# Patient Record
Sex: Female | Born: 1965
Health system: Southern US, Community
[De-identification: ages and names within clinical notes are randomized; demographics above are authoritative.]

## PROBLEM LIST (undated history)

## (undated) DIAGNOSIS — Z923 Personal history of irradiation: Secondary | ICD-10-CM

## (undated) DIAGNOSIS — K810 Acute cholecystitis: Secondary | ICD-10-CM

## (undated) DIAGNOSIS — Z9221 Personal history of antineoplastic chemotherapy: Secondary | ICD-10-CM

## (undated) DIAGNOSIS — C50919 Malignant neoplasm of unspecified site of unspecified female breast: Secondary | ICD-10-CM

## (undated) DIAGNOSIS — F419 Anxiety disorder, unspecified: Secondary | ICD-10-CM

## (undated) DIAGNOSIS — R7989 Other specified abnormal findings of blood chemistry: Secondary | ICD-10-CM

## (undated) DIAGNOSIS — D649 Anemia, unspecified: Secondary | ICD-10-CM

## (undated) DIAGNOSIS — T7840XA Allergy, unspecified, initial encounter: Secondary | ICD-10-CM

## (undated) DIAGNOSIS — J189 Pneumonia, unspecified organism: Secondary | ICD-10-CM

## (undated) DIAGNOSIS — K851 Biliary acute pancreatitis without necrosis or infection: Secondary | ICD-10-CM

## (undated) DIAGNOSIS — K219 Gastro-esophageal reflux disease without esophagitis: Secondary | ICD-10-CM

## (undated) HISTORY — DX: Malignant neoplasm of unspecified site of unspecified female breast: C50.919

## (undated) HISTORY — DX: Allergy, unspecified, initial encounter: T78.40XA

## (undated) HISTORY — DX: Other specified abnormal findings of blood chemistry: R79.89

## (undated) HISTORY — PX: BREAST LUMPECTOMY: SHX2

## (undated) HISTORY — PX: DILATION AND CURETTAGE OF UTERUS: SHX78

## (undated) HISTORY — DX: Acute cholecystitis: K81.0

## (undated) HISTORY — DX: Biliary acute pancreatitis without necrosis or infection: K85.10

---

## 2004-10-24 ENCOUNTER — Other Ambulatory Visit: Admission: RE | Admit: 2004-10-24 | Discharge: 2004-10-24 | Payer: Self-pay | Admitting: Obstetrics and Gynecology

## 2005-03-27 ENCOUNTER — Ambulatory Visit (HOSPITAL_COMMUNITY): Admission: RE | Admit: 2005-03-27 | Discharge: 2005-03-27 | Payer: Self-pay | Admitting: Family Medicine

## 2006-05-14 ENCOUNTER — Other Ambulatory Visit: Admission: RE | Admit: 2006-05-14 | Discharge: 2006-05-14 | Payer: Self-pay | Admitting: Gynecology

## 2006-06-12 ENCOUNTER — Ambulatory Visit (HOSPITAL_COMMUNITY): Admission: RE | Admit: 2006-06-12 | Discharge: 2006-06-12 | Payer: Self-pay | Admitting: Gynecology

## 2007-06-23 ENCOUNTER — Other Ambulatory Visit: Admission: RE | Admit: 2007-06-23 | Discharge: 2007-06-23 | Payer: Self-pay | Admitting: Gynecology

## 2007-10-12 ENCOUNTER — Other Ambulatory Visit: Admission: RE | Admit: 2007-10-12 | Discharge: 2007-10-12 | Payer: Self-pay | Admitting: Gynecology

## 2008-02-29 ENCOUNTER — Ambulatory Visit (HOSPITAL_COMMUNITY): Admission: RE | Admit: 2008-02-29 | Discharge: 2008-02-29 | Payer: Self-pay | Admitting: Gynecology

## 2008-06-23 ENCOUNTER — Other Ambulatory Visit: Admission: RE | Admit: 2008-06-23 | Discharge: 2008-06-23 | Payer: Self-pay | Admitting: Gynecology

## 2009-09-08 ENCOUNTER — Ambulatory Visit (HOSPITAL_COMMUNITY): Admission: RE | Admit: 2009-09-08 | Discharge: 2009-09-08 | Payer: Self-pay | Admitting: Gynecology

## 2010-11-08 ENCOUNTER — Ambulatory Visit (HOSPITAL_COMMUNITY)
Admission: RE | Admit: 2010-11-08 | Discharge: 2010-11-08 | Payer: Self-pay | Source: Home / Self Care | Attending: Obstetrics & Gynecology | Admitting: Obstetrics & Gynecology

## 2012-03-18 ENCOUNTER — Other Ambulatory Visit (HOSPITAL_COMMUNITY): Payer: Self-pay | Admitting: Obstetrics & Gynecology

## 2012-03-18 DIAGNOSIS — Z1231 Encounter for screening mammogram for malignant neoplasm of breast: Secondary | ICD-10-CM

## 2012-04-10 ENCOUNTER — Ambulatory Visit (HOSPITAL_COMMUNITY)
Admission: RE | Admit: 2012-04-10 | Discharge: 2012-04-10 | Disposition: A | Discharge status: Home / Self Care | Payer: Self-pay | Source: Ambulatory Visit | Attending: Obstetrics & Gynecology | Admitting: Obstetrics & Gynecology

## 2012-04-10 DIAGNOSIS — Z1231 Encounter for screening mammogram for malignant neoplasm of breast: Secondary | ICD-10-CM

## 2014-08-03 ENCOUNTER — Other Ambulatory Visit (HOSPITAL_COMMUNITY)
Admission: RE | Admit: 2014-08-03 | Discharge: 2014-08-03 | Disposition: A | Discharge status: Home / Self Care | Payer: Managed Care, Other (non HMO) | Source: Ambulatory Visit | Attending: Women's Health | Admitting: Women's Health

## 2014-08-03 ENCOUNTER — Encounter: Payer: Self-pay | Admitting: Women's Health

## 2014-08-03 ENCOUNTER — Ambulatory Visit (INDEPENDENT_AMBULATORY_CARE_PROVIDER_SITE_OTHER): Payer: Managed Care, Other (non HMO) | Admitting: Women's Health

## 2014-08-03 ENCOUNTER — Other Ambulatory Visit: Payer: Self-pay | Admitting: Women's Health

## 2014-08-03 ENCOUNTER — Other Ambulatory Visit: Payer: Self-pay | Admitting: Gynecology

## 2014-08-03 VITALS — BP 120/79 | Ht 66.0 in | Wt 232.4 lb

## 2014-08-03 DIAGNOSIS — R87810 Cervical high risk human papillomavirus (HPV) DNA test positive: Secondary | ICD-10-CM | POA: Insufficient documentation

## 2014-08-03 DIAGNOSIS — Z1151 Encounter for screening for human papillomavirus (HPV): Secondary | ICD-10-CM | POA: Insufficient documentation

## 2014-08-03 DIAGNOSIS — Z113 Encounter for screening for infections with a predominantly sexual mode of transmission: Secondary | ICD-10-CM

## 2014-08-03 DIAGNOSIS — Z01419 Encounter for gynecological examination (general) (routine) without abnormal findings: Secondary | ICD-10-CM | POA: Diagnosis present

## 2014-08-03 DIAGNOSIS — Z1231 Encounter for screening mammogram for malignant neoplasm of breast: Secondary | ICD-10-CM

## 2014-08-03 DIAGNOSIS — Z13220 Encounter for screening for lipoid disorders: Secondary | ICD-10-CM

## 2014-08-03 LAB — CBC WITH DIFFERENTIAL/PLATELET
Basophils Absolute: 0 10*3/uL (ref 0.0–0.1)
Basophils Relative: 0 % (ref 0–1)
Eosinophils Absolute: 0.1 10*3/uL (ref 0.0–0.7)
Eosinophils Relative: 2 % (ref 0–5)
HCT: 39 % (ref 36.0–46.0)
Hemoglobin: 13.3 g/dL (ref 12.0–15.0)
Lymphocytes Relative: 26 % (ref 12–46)
Lymphs Abs: 1.9 10*3/uL (ref 0.7–4.0)
MCH: 30.3 pg (ref 26.0–34.0)
MCHC: 34.1 g/dL (ref 30.0–36.0)
MCV: 88.8 fL (ref 78.0–100.0)
Monocytes Absolute: 0.6 10*3/uL (ref 0.1–1.0)
Monocytes Relative: 8 % (ref 3–12)
Neutro Abs: 4.7 10*3/uL (ref 1.7–7.7)
Neutrophils Relative %: 64 % (ref 43–77)
Platelets: 314 10*3/uL (ref 150–400)
RBC: 4.39 MIL/uL (ref 3.87–5.11)
RDW: 13.7 % (ref 11.5–15.5)
WBC: 7.3 10*3/uL (ref 4.0–10.5)

## 2014-08-03 LAB — COMPREHENSIVE METABOLIC PANEL
ALT: 26 U/L (ref 0–35)
AST: 19 U/L (ref 0–37)
Albumin: 4.4 g/dL (ref 3.5–5.2)
Alkaline Phosphatase: 47 U/L (ref 39–117)
BUN: 12 mg/dL (ref 6–23)
CO2: 25 mEq/L (ref 19–32)
Calcium: 10 mg/dL (ref 8.4–10.5)
Chloride: 99 mEq/L (ref 96–112)
Creat: 0.76 mg/dL (ref 0.50–1.10)
Glucose, Bld: 120 mg/dL — ABNORMAL HIGH (ref 70–99)
Potassium: 4.2 mEq/L (ref 3.5–5.3)
Sodium: 135 mEq/L (ref 135–145)
Total Bilirubin: 0.6 mg/dL (ref 0.2–1.2)
Total Protein: 7.1 g/dL (ref 6.0–8.3)

## 2014-08-03 LAB — LIPID PANEL
Cholesterol: 160 mg/dL (ref 0–200)
HDL: 63 mg/dL (ref >39)
LDL Cholesterol: 78 mg/dL (ref 0–99)
Total CHOL/HDL Ratio: 2.5 Ratio
Triglycerides: 95 mg/dL (ref <150)
VLDL: 19 mg/dL (ref 0–40)

## 2014-08-03 NOTE — Progress Notes (Signed)
Lindsay Hampton [birth year] [id number]    History:    Presents for annual exam.  Monthly 4 days cycle/condoms. [facility], was [facility] after [facility]. 2 children both doing well. Mother breast cancer in her 34s, survivor. Normal mammograms, last mammogram 2013. New partner.  Past medical history, past surgical history, family history and social history were all reviewed and documented in the EPIC chart. Works for [employer]. Father prostate cancer.  ROS:  A  12 point ROS was performed and pertinent positives and negatives are included.  Exam:  Filed Vitals:   08/03/14 0950  BP: 120/79    General appearance:  Normal Thyroid:  Symmetrical, normal in size, without palpable masses or nodularity. Respiratory  Auscultation:  Clear without wheezing or rhonchi Cardiovascular  Auscultation:  Regular rate, without rubs, murmurs or gallops  Edema/varicosities:  Not grossly evident Abdominal  Soft,nontender, without masses, guarding or rebound.  Liver/spleen:  No organomegaly noted  Hernia:  None appreciated  Skin  Inspection:  Grossly normal   Breasts: Examined lying and sitting.     Right: Without masses, retractions, discharge or axillary adenopathy.     Left: Without masses, retractions, discharge or axillary adenopathy. Gentitourinary   Inguinal/mons:  Normal without inguinal adenopathy  External genitalia:  Normal  BUS/Urethra/Skene's glands:  Normal  Vagina:  Normal  Cervix:  Normal  Uterus:   normal in size, shape and contour.  Midline and mobile  Adnexa/parametria:     Rt: Without masses or tenderness.   Lt: Without masses or tenderness.  Anus and perineum: Normal  Digital rectal exam: Normal sphincter tone without palpated masses or tenderness  Assessment/Plan:  48 y.o. DWF G3P2 for annual exam with no complaints.  Normal GYN exam/condoms STD screen  Plan: SBE's, schedule annual screening mammogram, 3D  tomography reviewed, history of mother breast cancer in her 47s. Increase regular exercise and decrease calories for weight loss, vitamin D 2000 daily encouraged. CBC, CMP, lipid panel, GC/Chlamydia, HIV, hep B, C., RPR. Pap with high-risk HPV typing. New Pap screening guidelines reviewed  Note: This dictation was prepared with Dragon/digital dictation.  Any transcriptional errors that result are unintentional. Huel Cote WHNP, 1:10 PM 08/03/2014

## 2014-08-03 NOTE — Patient Instructions (Signed)

## 2014-08-04 LAB — CYTOLOGY - PAP

## 2014-08-04 LAB — URINALYSIS W MICROSCOPIC + REFLEX CULTURE
Bilirubin Urine: NEGATIVE
Casts: NONE SEEN
Crystals: NONE SEEN
Glucose, UA: NEGATIVE mg/dL
Hgb urine dipstick: NEGATIVE
Ketones, ur: NEGATIVE mg/dL
Nitrite: NEGATIVE
Protein, ur: NEGATIVE mg/dL
Specific Gravity, Urine: 1.016 (ref 1.005–1.030)
Urobilinogen, UA: 0.2 mg/dL (ref 0.0–1.0)
pH: 5.5 (ref 5.0–8.0)

## 2014-08-04 LAB — RPR

## 2014-08-04 LAB — GC/CHLAMYDIA PROBE AMP
CT Probe RNA: NEGATIVE
GC Probe RNA: NEGATIVE

## 2014-08-04 LAB — HIV ANTIBODY (ROUTINE TESTING W REFLEX): HIV 1&2 Ab, 4th Generation: NONREACTIVE

## 2014-08-04 LAB — HEMOGLOBIN A1C
Hgb A1c MFr Bld: 5.8 % — ABNORMAL HIGH (ref <5.7)
Mean Plasma Glucose: 120 mg/dL — ABNORMAL HIGH (ref <117)

## 2014-08-04 LAB — HEPATITIS C ANTIBODY: HCV Ab: NEGATIVE

## 2014-08-04 LAB — HEPATITIS B SURFACE ANTIGEN: Hepatitis B Surface Ag: NEGATIVE

## 2014-08-05 LAB — URINE CULTURE: Colony Count: 15000

## 2014-09-07 ENCOUNTER — Telehealth: Payer: Self-pay | Admitting: *Deleted

## 2014-09-07 NOTE — Telephone Encounter (Signed)
Pt called with questions regarding lab results on 08/03/14 all question answered.

## 2014-09-27 ENCOUNTER — Ambulatory Visit (HOSPITAL_COMMUNITY)
Admission: RE | Admit: 2014-09-27 | Discharge: 2014-09-27 | Disposition: A | Discharge status: Home / Self Care | Payer: Managed Care, Other (non HMO) | Source: Ambulatory Visit | Attending: Gynecology | Admitting: Gynecology

## 2014-09-27 DIAGNOSIS — Z1231 Encounter for screening mammogram for malignant neoplasm of breast: Secondary | ICD-10-CM

## 2014-09-28 ENCOUNTER — Ambulatory Visit (HOSPITAL_COMMUNITY): Payer: Self-pay

## 2015-08-08 ENCOUNTER — Encounter: Payer: Managed Care, Other (non HMO) | Admitting: Women's Health

## 2015-09-05 ENCOUNTER — Telehealth: Payer: Self-pay | Admitting: *Deleted

## 2015-09-05 NOTE — Telephone Encounter (Signed)
Pt called requesting refill Xanax, overdue for annual, I left on voicemail to make OV to discuss with nancy.

## 2015-09-26 ENCOUNTER — Ambulatory Visit (INDEPENDENT_AMBULATORY_CARE_PROVIDER_SITE_OTHER): Payer: Managed Care, Other (non HMO) | Admitting: Women's Health

## 2015-09-26 ENCOUNTER — Other Ambulatory Visit (HOSPITAL_COMMUNITY)
Admission: RE | Admit: 2015-09-26 | Discharge: 2015-09-26 | Disposition: A | Discharge status: Home / Self Care | Payer: Managed Care, Other (non HMO) | Source: Ambulatory Visit | Attending: Women's Health | Admitting: Women's Health

## 2015-09-26 ENCOUNTER — Encounter: Payer: Self-pay | Admitting: Women's Health

## 2015-09-26 VITALS — BP 132/82 | Ht 66.0 in | Wt 210.0 lb

## 2015-09-26 DIAGNOSIS — Z113 Encounter for screening for infections with a predominantly sexual mode of transmission: Secondary | ICD-10-CM

## 2015-09-26 DIAGNOSIS — F4322 Adjustment disorder with anxiety: Secondary | ICD-10-CM

## 2015-09-26 DIAGNOSIS — Z01419 Encounter for gynecological examination (general) (routine) without abnormal findings: Secondary | ICD-10-CM

## 2015-09-26 DIAGNOSIS — Z01411 Encounter for gynecological examination (general) (routine) with abnormal findings: Secondary | ICD-10-CM | POA: Insufficient documentation

## 2015-09-26 DIAGNOSIS — Z1151 Encounter for screening for human papillomavirus (HPV): Secondary | ICD-10-CM | POA: Diagnosis not present

## 2015-09-26 DIAGNOSIS — R87810 Cervical high risk human papillomavirus (HPV) DNA test positive: Secondary | ICD-10-CM | POA: Insufficient documentation

## 2015-09-26 DIAGNOSIS — Z13220 Encounter for screening for lipoid disorders: Secondary | ICD-10-CM

## 2015-09-26 LAB — CBC WITH DIFFERENTIAL/PLATELET
Basophils Absolute: 0 10*3/uL (ref 0.0–0.1)
Basophils Relative: 0 % (ref 0–1)
Eosinophils Absolute: 0.2 10*3/uL (ref 0.0–0.7)
Eosinophils Relative: 2 % (ref 0–5)
HCT: 37.9 % (ref 36.0–46.0)
Hemoglobin: 13.4 g/dL (ref 12.0–15.0)
Lymphocytes Relative: 22 % (ref 12–46)
Lymphs Abs: 1.8 10*3/uL (ref 0.7–4.0)
MCH: 32.7 pg (ref 26.0–34.0)
MCHC: 35.4 g/dL (ref 30.0–36.0)
MCV: 92.4 fL (ref 78.0–100.0)
MPV: 8.7 fL (ref 8.6–12.4)
Monocytes Absolute: 0.7 10*3/uL (ref 0.1–1.0)
Monocytes Relative: 9 % (ref 3–12)
Neutro Abs: 5.6 10*3/uL (ref 1.7–7.7)
Neutrophils Relative %: 67 % (ref 43–77)
Platelets: 254 10*3/uL (ref 150–400)
RBC: 4.1 MIL/uL (ref 3.87–5.11)
RDW: 14 % (ref 11.5–15.5)
WBC: 8.3 10*3/uL (ref 4.0–10.5)

## 2015-09-26 LAB — HEMOGLOBIN A1C
Hgb A1c MFr Bld: 5.5 % (ref <5.7)
Mean Plasma Glucose: 111 mg/dL (ref <117)

## 2015-09-26 LAB — LIPID PANEL
Cholesterol: 182 mg/dL (ref 125–200)
HDL: 88 mg/dL (ref >=46)
LDL Cholesterol: 74 mg/dL (ref <130)
Total CHOL/HDL Ratio: 2.1 Ratio (ref <=5.0)
Triglycerides: 101 mg/dL (ref <150)
VLDL: 20 mg/dL (ref <30)

## 2015-09-26 LAB — RPR

## 2015-09-26 MED ORDER — ALPRAZOLAM 0.25 MG PO TABS: 0.2500 mg | ORAL_TABLET | Freq: Every evening | ORAL | Status: DC | PRN

## 2015-09-26 NOTE — Patient Instructions (Signed)

## 2015-09-26 NOTE — Progress Notes (Signed)
Lindsay Hampton 1966/01/17 [id number]    History:    Presents for annual exam.  Regular monthly cycle/condoms. New partner. Normal  mammogram history. 2015 Pap normal positive HR HPV, -16, 18 and 45. Normal Paps prior. Hemoglobin A1c 5.8  2015. Mother breast cancer in her 24s now 78/ survivor.  Past medical history, past surgical history, family history and social history were all reviewed and documented in the EPIC chart. Works at [employer] on [employer]. Son 85, daughter 31 both doing well. Father prostate cancer.  ROS:  A ROS was performed and pertinent positives and negatives are included.  Exam:  Filed Vitals:   09/26/15 1357  BP: 132/82    General appearance:  Normal Thyroid:  Symmetrical, normal in size, without palpable masses or nodularity. Respiratory  Auscultation:  Clear without wheezing or rhonchi Cardiovascular  Auscultation:  Regular rate, without rubs, murmurs or gallops  Edema/varicosities:  Not grossly evident Abdominal  Soft,nontender, without masses, guarding or rebound.  Liver/spleen:  No organomegaly noted  Hernia:  None appreciated  Skin  Inspection:  Grossly normal   Breasts: Examined lying and sitting.     Right: Without masses, retractions, discharge or axillary adenopathy.     Left: Without masses, retractions, discharge or axillary adenopathy. Gentitourinary   Inguinal/mons:  Normal without inguinal adenopathy  External genitalia:  Normal  BUS/Urethra/Skene's glands:  Normal  Vagina:  Normal  Cervix:  Normal  Uterus:  normal in size, shape and contour.  Midline and mobile  Adnexa/parametria:     Rt: Without masses or tenderness.   Lt: Without masses or tenderness.  Anus and perineum: Normal  Digital rectal exam: Normal sphincter tone without palpated masses or tenderness  Assessment/Plan:  49 y.o. DW F G2 P2 for annual exam with no complaints.  2015 Pap normal with positive HR HPV with  -16, 18 and 45 Monthly cycle/condoms STD  screen  Plan: SBE's, continue annual screening mammogram, 3-D tomography reviewed and encouraged, calcium rich diet, vitamin D 1000 daily encouraged. Congratulated on weight loss of 25 pounds with diet and exercise, continue healthy lifestyle. Contraception reviewed declines will continue condoms. CBC, lipid panel, vitamin D, hemoglobin A1c. Pap with HR HPV typing. GC/Chlamydia, HIV, hep B, C, RPRHuel Cote WHNP, 2:54 PM 09/26/2015

## 2015-09-27 ENCOUNTER — Other Ambulatory Visit: Payer: Self-pay | Admitting: Women's Health

## 2015-09-27 ENCOUNTER — Other Ambulatory Visit: Payer: Self-pay

## 2015-09-27 DIAGNOSIS — Z1231 Encounter for screening mammogram for malignant neoplasm of breast: Secondary | ICD-10-CM

## 2015-09-27 LAB — URINALYSIS W MICROSCOPIC + REFLEX CULTURE
Bacteria, UA: NONE SEEN [HPF]
Bilirubin Urine: NEGATIVE
Casts: NONE SEEN [LPF]
Crystals: NONE SEEN [HPF]
Glucose, UA: NEGATIVE
Hgb urine dipstick: NEGATIVE
Ketones, ur: NEGATIVE
Nitrite: NEGATIVE
Protein, ur: NEGATIVE
RBC / HPF: NONE SEEN RBC/HPF (ref <=2)
Specific Gravity, Urine: 1.025 (ref 1.001–1.035)
Yeast: NONE SEEN [HPF]
pH: 6 (ref 5.0–8.0)

## 2015-09-27 LAB — VITAMIN D 25 HYDROXY (VIT D DEFICIENCY, FRACTURES): Vit D, 25-Hydroxy: 21 ng/mL — ABNORMAL LOW (ref 30–100)

## 2015-09-27 LAB — GC/CHLAMYDIA PROBE AMP
CT Probe RNA: NEGATIVE
GC Probe RNA: NEGATIVE

## 2015-09-27 LAB — HIV ANTIBODY (ROUTINE TESTING W REFLEX): HIV 1&2 Ab, 4th Generation: NONREACTIVE

## 2015-09-27 LAB — HEPATITIS B SURFACE ANTIGEN: Hepatitis B Surface Ag: NEGATIVE

## 2015-09-27 LAB — HEPATITIS C ANTIBODY: HCV Ab: NEGATIVE

## 2015-09-27 MED ORDER — VITAMIN D (ERGOCALCIFEROL) 1.25 MG (50000 UNIT) PO CAPS: 50000.0000 [IU] | ORAL_CAPSULE | ORAL | Status: DC

## 2015-09-28 LAB — URINE CULTURE
Colony Count: NO GROWTH
Organism ID, Bacteria: NO GROWTH

## 2015-09-29 LAB — CYTOLOGY - PAP

## 2015-10-09 ENCOUNTER — Encounter: Payer: Self-pay | Admitting: *Deleted

## 2015-10-17 ENCOUNTER — Ambulatory Visit: Payer: Managed Care, Other (non HMO)

## 2015-10-31 ENCOUNTER — Ambulatory Visit
Admission: RE | Admit: 2015-10-31 | Discharge: 2015-10-31 | Disposition: A | Discharge status: Home / Self Care | Payer: Managed Care, Other (non HMO) | Source: Ambulatory Visit

## 2015-10-31 DIAGNOSIS — Z1231 Encounter for screening mammogram for malignant neoplasm of breast: Secondary | ICD-10-CM

## 2015-11-01 ENCOUNTER — Other Ambulatory Visit: Payer: Self-pay | Admitting: Gynecology

## 2015-11-01 DIAGNOSIS — R928 Other abnormal and inconclusive findings on diagnostic imaging of breast: Secondary | ICD-10-CM

## 2015-11-16 ENCOUNTER — Ambulatory Visit
Admission: RE | Admit: 2015-11-16 | Discharge: 2015-11-16 | Disposition: A | Discharge status: Home / Self Care | Payer: Managed Care, Other (non HMO) | Source: Ambulatory Visit | Attending: Gynecology | Admitting: Gynecology

## 2015-11-16 DIAGNOSIS — R928 Other abnormal and inconclusive findings on diagnostic imaging of breast: Secondary | ICD-10-CM

## 2016-01-02 ENCOUNTER — Other Ambulatory Visit: Payer: Self-pay

## 2016-01-02 DIAGNOSIS — F4322 Adjustment disorder with anxiety: Secondary | ICD-10-CM

## 2016-01-02 MED ORDER — ALPRAZOLAM 0.25 MG PO TABS: 0.2500 mg | ORAL_TABLET | Freq: Every evening | ORAL | Status: DC | PRN

## 2016-01-02 NOTE — Telephone Encounter (Signed)
Called into pharmacy

## 2016-04-11 ENCOUNTER — Other Ambulatory Visit: Payer: Self-pay

## 2016-04-11 DIAGNOSIS — F4322 Adjustment disorder with anxiety: Secondary | ICD-10-CM

## 2016-04-11 MED ORDER — ALPRAZOLAM 0.25 MG PO TABS: 0.2500 mg | ORAL_TABLET | Freq: Every evening | ORAL | Status: DC | PRN

## 2016-04-11 NOTE — Telephone Encounter (Signed)
Called into pharmacy

## 2016-08-13 ENCOUNTER — Other Ambulatory Visit: Payer: Self-pay

## 2016-08-13 DIAGNOSIS — F4322 Adjustment disorder with anxiety: Secondary | ICD-10-CM

## 2016-08-13 MED ORDER — ALPRAZOLAM 0.25 MG PO TABS: 0.2500 mg | ORAL_TABLET | Freq: Every evening | ORAL | 0 refills | Status: DC | PRN

## 2016-08-13 NOTE — Telephone Encounter (Signed)
Okay for refill?  

## 2016-08-13 NOTE — Telephone Encounter (Signed)
Called into pharmacy

## 2016-12-03 ENCOUNTER — Other Ambulatory Visit: Payer: Self-pay | Admitting: Gynecology

## 2016-12-03 DIAGNOSIS — Z1231 Encounter for screening mammogram for malignant neoplasm of breast: Secondary | ICD-10-CM

## 2016-12-05 ENCOUNTER — Other Ambulatory Visit: Payer: Self-pay | Admitting: Women's Health

## 2016-12-05 ENCOUNTER — Telehealth: Payer: Self-pay | Admitting: *Deleted

## 2016-12-05 DIAGNOSIS — F4322 Adjustment disorder with anxiety: Secondary | ICD-10-CM

## 2016-12-05 MED ORDER — ALPRAZOLAM 0.25 MG PO TABS: 0.2500 mg | ORAL_TABLET | Freq: Every evening | ORAL | 0 refills | Status: DC | PRN

## 2016-12-05 NOTE — Telephone Encounter (Signed)
Pt has annual scheduled on 12/17/16, requesting xanax 0.25 mg tablet now that annual is scheduled. Please advise

## 2016-12-05 NOTE — Telephone Encounter (Signed)
Ok for refill? 

## 2016-12-05 NOTE — Telephone Encounter (Signed)
Rx called in 

## 2016-12-17 ENCOUNTER — Ambulatory Visit (INDEPENDENT_AMBULATORY_CARE_PROVIDER_SITE_OTHER): Payer: Managed Care, Other (non HMO) | Admitting: Women's Health

## 2016-12-17 ENCOUNTER — Encounter: Payer: Self-pay | Admitting: Women's Health

## 2016-12-17 VITALS — BP 130/86 | Ht 66.0 in | Wt 215.0 lb

## 2016-12-17 DIAGNOSIS — Z13220 Encounter for screening for lipoid disorders: Secondary | ICD-10-CM

## 2016-12-17 DIAGNOSIS — Z01419 Encounter for gynecological examination (general) (routine) without abnormal findings: Secondary | ICD-10-CM | POA: Diagnosis not present

## 2016-12-17 DIAGNOSIS — Z113 Encounter for screening for infections with a predominantly sexual mode of transmission: Secondary | ICD-10-CM

## 2016-12-17 LAB — CBC WITH DIFFERENTIAL/PLATELET
Basophils Absolute: 0 cells/uL (ref 0–200)
Basophils Relative: 0 %
Eosinophils Absolute: 176 cells/uL (ref 15–500)
Eosinophils Relative: 2 %
HCT: 40.8 % (ref 35.0–45.0)
Hemoglobin: 13.8 g/dL (ref 11.7–15.5)
Lymphocytes Relative: 23 %
Lymphs Abs: 2024 cells/uL (ref 850–3900)
MCH: 32.3 pg (ref 27.0–33.0)
MCHC: 33.8 g/dL (ref 32.0–36.0)
MCV: 95.6 fL (ref 80.0–100.0)
MPV: 9.1 fL (ref 7.5–12.5)
Monocytes Absolute: 792 cells/uL (ref 200–950)
Monocytes Relative: 9 %
Neutro Abs: 5808 cells/uL (ref 1500–7800)
Neutrophils Relative %: 66 %
Platelets: 293 10*3/uL (ref 140–400)
RBC: 4.27 MIL/uL (ref 3.80–5.10)
RDW: 13 % (ref 11.0–15.0)
WBC: 8.8 10*3/uL (ref 3.8–10.8)

## 2016-12-17 LAB — COMPREHENSIVE METABOLIC PANEL
ALT: 21 U/L (ref 6–29)
AST: 19 U/L (ref 10–35)
Albumin: 4.1 g/dL (ref 3.6–5.1)
Alkaline Phosphatase: 38 U/L (ref 33–115)
BUN: 17 mg/dL (ref 7–25)
CO2: 22 mmol/L (ref 20–31)
Calcium: 9.7 mg/dL (ref 8.6–10.2)
Chloride: 102 mmol/L (ref 98–110)
Creat: 0.94 mg/dL (ref 0.50–1.10)
Glucose, Bld: 97 mg/dL (ref 65–99)
Potassium: 4.2 mmol/L (ref 3.5–5.3)
Sodium: 135 mmol/L (ref 135–146)
Total Bilirubin: 0.6 mg/dL (ref 0.2–1.2)
Total Protein: 7 g/dL (ref 6.1–8.1)

## 2016-12-17 LAB — LIPID PANEL
Cholesterol: 165 mg/dL (ref <200)
HDL: 72 mg/dL (ref >50)
LDL Cholesterol: 70 mg/dL (ref <100)
Total CHOL/HDL Ratio: 2.3 Ratio (ref <5.0)
Triglycerides: 113 mg/dL (ref <150)
VLDL: 23 mg/dL (ref <30)

## 2016-12-17 LAB — HEPATITIS B SURFACE ANTIGEN: Hepatitis B Surface Ag: NEGATIVE

## 2016-12-17 LAB — HIV ANTIBODY (ROUTINE TESTING W REFLEX): HIV 1&2 Ab, 4th Generation: NONREACTIVE

## 2016-12-17 LAB — HEPATITIS C ANTIBODY: HCV Ab: NEGATIVE

## 2016-12-17 NOTE — Patient Instructions (Signed)
Dr [facility]  [phone]   Health Maintenance, Female Introduction Adopting a healthy lifestyle and getting preventive care can go a long way to promote health and wellness. Talk with your health care provider about what schedule of regular examinations is right for you. This is a good chance for you to check in with your provider about disease prevention and staying healthy. In between checkups, there are plenty of things you can do on your own. Experts have done a lot of research about which lifestyle changes and preventive measures are most likely to keep you healthy. Ask your health care provider for more information. Weight and diet Eat a healthy diet  Be sure to include plenty of vegetables, fruits, low-fat dairy products, and lean protein.  Do not eat a lot of foods high in solid fats, added sugars, or salt.  Get regular exercise. This is one of the most important things you can do for your health.  Most adults should exercise for at least 150 minutes each week. The exercise should increase your heart rate and make you sweat (moderate-intensity exercise).  Most adults should also do strengthening exercises at least twice a week. This is in addition to the moderate-intensity exercise. Maintain a healthy weight  Body mass index (BMI) is a measurement that can be used to identify possible weight problems. It estimates body fat based on height and weight. Your health care provider can help determine your BMI and help you achieve or maintain a healthy weight.  For females 44 years of age and older:  A BMI below 18.5 is considered underweight.  A BMI of 18.5 to 24.9 is normal.  A BMI of 25 to 29.9 is considered overweight.  A BMI of 30 and above is considered obese. Watch levels of cholesterol and blood lipids  You should start having your blood tested for lipids and cholesterol at 50 years of age, then have this test every 5 years.  You may need to have your cholesterol  levels checked more often if:  Your lipid or cholesterol levels are high.  You are older than 50 years of age.  You are at high risk for heart disease. Cancer screening Lung Cancer  Lung cancer screening is recommended for adults 65-43 years old who are at high risk for lung cancer because of a history of smoking.  A yearly low-dose CT scan of the lungs is recommended for people who:  Currently smoke.  Have quit within the past 15 years.  Have at least a 30-pack-year history of smoking. A pack year is smoking an average of one pack of cigarettes a day for 1 year.  Yearly screening should continue until it has been 15 years since you quit.  Yearly screening should stop if you develop a health problem that would prevent you from having lung cancer treatment. Breast Cancer  Practice breast self-awareness. This means understanding how your breasts normally appear and feel.  It also means doing regular breast self-exams. Let your health care provider know about any changes, no matter how small.  If you are in your 20s or 30s, you should have a clinical breast exam (CBE) by a health care provider every 1-3 years as part of a regular health exam.  If you are 80 or older, have a CBE every year. Also consider having a breast X-ray (mammogram) every year.  If you have a family history of breast cancer, talk to your health care provider about genetic screening.  If  you are at high risk for breast cancer, talk to your health care provider about having an MRI and a mammogram every year.  Breast cancer gene (BRCA) assessment is recommended for women who have family members with BRCA-related cancers. BRCA-related cancers include:  Breast.  Ovarian.  Tubal.  Peritoneal cancers.  Results of the assessment will determine the need for genetic counseling and BRCA1 and BRCA2 testing. Cervical Cancer  Your health care provider may recommend that you be screened regularly for cancer of the  pelvic organs (ovaries, uterus, and vagina). This screening involves a pelvic examination, including checking for microscopic changes to the surface of your cervix (Pap test). You may be encouraged to have this screening done every 3 years, beginning at age 23.  For women ages 13-65, health care providers may recommend pelvic exams and Pap testing every 3 years, or they may recommend the Pap and pelvic exam, combined with testing for human papilloma virus (HPV), every 5 years. Some types of HPV increase your risk of cervical cancer. Testing for HPV may also be done on women of any age with unclear Pap test results.  Other health care providers may not recommend any screening for nonpregnant women who are considered low risk for pelvic cancer and who do not have symptoms. Ask your health care provider if a screening pelvic exam is right for you.  If you have had past treatment for cervical cancer or a condition that could lead to cancer, you need Pap tests and screening for cancer for at least 20 years after your treatment. If Pap tests have been discontinued, your risk factors (such as having a new sexual partner) need to be reassessed to determine if screening should resume. Some women have medical problems that increase the chance of getting cervical cancer. In these cases, your health care provider may recommend more frequent screening and Pap tests. Colorectal Cancer  This type of cancer can be detected and often prevented.  Routine colorectal cancer screening usually begins at 50 years of age and continues through 50 years of age.  Your health care provider may recommend screening at an earlier age if you have risk factors for colon cancer.  Your health care provider may also recommend using home test kits to check for hidden blood in the stool.  A small camera at the end of a tube can be used to examine your colon directly (sigmoidoscopy or colonoscopy). This is done to check for the earliest  forms of colorectal cancer.  Routine screening usually begins at age 74.  Direct examination of the colon should be repeated every 5-10 years through 50 years of age. However, you may need to be screened more often if early forms of precancerous polyps or small growths are found. Skin Cancer  Check your skin from head to toe regularly.  Tell your health care provider about any new moles or changes in moles, especially if there is a change in a mole's shape or color.  Also tell your health care provider if you have a mole that is larger than the size of a pencil eraser.  Always use sunscreen. Apply sunscreen liberally and repeatedly throughout the day.  Protect yourself by wearing long sleeves, pants, a wide-brimmed hat, and sunglasses whenever you are outside. Heart disease, diabetes, and high blood pressure  High blood pressure causes heart disease and increases the risk of stroke. High blood pressure is more likely to develop in:  People who have blood pressure in the high  end of the normal range (130-139/85-89 mm Hg).  People who are overweight or obese.  People who are African American.  If you are 46-81 years of age, have your blood pressure checked every 3-5 years. If you are 35 years of age or older, have your blood pressure checked every year. You should have your blood pressure measured twice-once when you are at a hospital or clinic, and once when you are not at a hospital or clinic. Record the average of the two measurements. To check your blood pressure when you are not at a hospital or clinic, you can use:  An automated blood pressure machine at a pharmacy.  A home blood pressure monitor.  If you are between 33 years and 58 years old, ask your health care provider if you should take aspirin to prevent strokes.  Have regular diabetes screenings. This involves taking a blood sample to check your fasting blood sugar level.  If you are at a normal weight and have a low  risk for diabetes, have this test once every three years after 50 years of age.  If you are overweight and have a high risk for diabetes, consider being tested at a younger age or more often. Preventing infection Hepatitis B  If you have a higher risk for hepatitis B, you should be screened for this virus. You are considered at high risk for hepatitis B if:  You were born in a country where hepatitis B is common. Ask your health care provider which countries are considered high risk.  Your parents were born in a high-risk country, and you have not been immunized against hepatitis B (hepatitis B vaccine).  You have HIV or AIDS.  You use needles to inject street drugs.  You live with someone who has hepatitis B.  You have had sex with someone who has hepatitis B.  You get hemodialysis treatment.  You take certain medicines for conditions, including cancer, organ transplantation, and autoimmune conditions. Hepatitis C  Blood testing is recommended for:  Everyone born from [birth year] through 1964.  Anyone with known risk factors for hepatitis C. Sexually transmitted infections (STIs)  You should be screened for sexually transmitted infections (STIs) including gonorrhea and chlamydia if:  You are sexually active and are younger than 50 years of age.  You are older than 50 years of age and your health care provider tells you that you are at risk for this type of infection.  Your sexual activity has changed since you were last screened and you are at an increased risk for chlamydia or gonorrhea. Ask your health care provider if you are at risk.  If you do not have HIV, but are at risk, it may be recommended that you take a prescription medicine daily to prevent HIV infection. This is called pre-exposure prophylaxis (PrEP). You are considered at risk if:  You are sexually active and do not regularly use condoms or know the HIV status of your partner(s).  You take drugs by  injection.  You are sexually active with a partner who has HIV. Talk with your health care provider about whether you are at high risk of being infected with HIV. If you choose to begin PrEP, you should first be tested for HIV. You should then be tested every 3 months for as long as you are taking PrEP. Pregnancy  If you are premenopausal and you may become pregnant, ask your health care provider about preconception counseling.  If you may become pregnant,  take 400 to 800 micrograms (mcg) of folic acid every day.  If you want to prevent pregnancy, talk to your health care provider about birth control (contraception). Osteoporosis and menopause  Osteoporosis is a disease in which the bones lose minerals and strength with aging. This can result in serious bone fractures. Your risk for osteoporosis can be identified using a bone density scan.  If you are 63 years of age or older, or if you are at risk for osteoporosis and fractures, ask your health care provider if you should be screened.  Ask your health care provider whether you should take a calcium or vitamin D supplement to lower your risk for osteoporosis.  Menopause may have certain physical symptoms and risks.  Hormone replacement therapy may reduce some of these symptoms and risks. Talk to your health care provider about whether hormone replacement therapy is right for you. Follow these instructions at home:  Schedule regular health, dental, and eye exams.  Stay current with your immunizations.  Do not use any tobacco products including cigarettes, chewing tobacco, or electronic cigarettes.  If you are pregnant, do not drink alcohol.  If you are breastfeeding, limit how much and how often you drink alcohol.  Limit alcohol intake to no more than 1 drink per day for nonpregnant women. One drink equals 12 ounces of beer, 5 ounces of wine, or 1 ounces of hard liquor.  Do not use street drugs.  Do not share needles.  Ask  your health care provider for help if you need support or information about quitting drugs.  Tell your health care provider if you often feel depressed.  Tell your health care provider if you have ever been abused or do not feel safe at home. This information is not intended to replace advice given to you by your health care provider. Make sure you discuss any questions you have with your health care provider. Document Released: 05/27/2011 Document Revised: 04/18/2016 Document Reviewed: 08/15/2015  2017 [employer]vier

## 2016-12-17 NOTE — Progress Notes (Signed)
Lindsay Hampton [birth year] [id number]    History:    Presents for annual exam.  Monthly cycle/condoms/new partner. 2016 normal Pap with positive HR HPV -16, 18 and 45, reviewed if persistent high risk HPV colposcope with Dr. Phineas Real. History of normal Paps prior. Mother breast cancer age 50 now doing well. History of low vitamin D but states was unable to swallow the 50,000 weekly gelcaps.  Past medical history, past surgical history, family history and social history were all reviewed and documented in the EPIC chart. Works at [employer]. Daughter 34 doing well, son 73 in high school. Father prostate cancer.  ROS:  A ROS was performed and pertinent positives and negatives are included.  Exam:  Vitals:   12/17/16 1209  BP: 130/86  Weight: 215 lb (97.5 kg)  Height: 5\' 6"  (1.676 m)   Body mass index is 34.7 kg/m.   General appearance:  Normal Thyroid:  Symmetrical, normal in size, without palpable masses or nodularity. Respiratory  Auscultation:  Clear without wheezing or rhonchi Cardiovascular  Auscultation:  Regular rate, without rubs, murmurs or gallops  Edema/varicosities:  Not grossly evident Abdominal  Soft,nontender, without masses, guarding or rebound.  Liver/spleen:  No organomegaly noted  Hernia:  None appreciated  Skin  Inspection:  Grossly normal   Breasts: Examined lying and sitting.     Right: Without masses, retractions, discharge or axillary adenopathy.     Left: Without masses, retractions, discharge or axillary adenopathy. Gentitourinary   Inguinal/mons:  Normal without inguinal adenopathy  External genitalia:  Normal  BUS/Urethra/Skene's glands:  Normal  Vagina:  Normal  Cervix:  Normal  Uterus:   normal in size, shape and contour.  Midline and mobile  Adnexa/parametria:     Rt: Without masses or tenderness.   Lt: Without masses or tenderness.  Anus and perineum: Normal  Digital rectal exam: Normal sphincter tone without palpated masses or  tenderness  Assessment/Plan:  51 y.o. D WF G2 P2 for annual exam with no complaints.  Monthly cycle/condoms STD screen Obesity  Plan: SBE's, continue annual 3-D screening mammogram, has scheduled next month. Reviewed importance of increasing exercise and decreasing calories for weight loss. Screening colonoscopy at age 36, [facility] information given. Vitamin D 1000 twice daily encouraged. CBC, CMP, lipid panel, UA, Pap with HR HPV typing, GC/Chlamydia, HIV, hep B, C, RPR. Encouraged condoms until permanent partner.    Huel Cote WHNP, 1:21 PM 12/17/2016

## 2016-12-18 LAB — URINALYSIS W MICROSCOPIC + REFLEX CULTURE
Bilirubin Urine: NEGATIVE
Casts: NONE SEEN [LPF]
Crystals: NONE SEEN [HPF]
Glucose, UA: NEGATIVE
Hgb urine dipstick: NEGATIVE
Ketones, ur: NEGATIVE
Nitrite: NEGATIVE
Protein, ur: NEGATIVE
RBC / HPF: NONE SEEN RBC/HPF (ref <=2)
Specific Gravity, Urine: 1.02 (ref 1.001–1.035)
Yeast: NONE SEEN [HPF]
pH: 6 (ref 5.0–8.0)

## 2016-12-18 LAB — RPR

## 2016-12-19 LAB — URINE CULTURE

## 2016-12-19 LAB — PAP, TP IMAGING W/ HPV RNA, RFLX HPV TYPE 16,18/45: HPV mRNA, High Risk: DETECTED — AB

## 2016-12-19 LAB — GC/CHLAMYDIA PROBE AMP
CT Probe RNA: NOT DETECTED
GC Probe RNA: NOT DETECTED

## 2016-12-20 LAB — HPV TYPE 16 AND 18/45 RNA
HPV Type 16 RNA: NOT DETECTED
HPV Type 18/45 RNA: NOT DETECTED

## 2016-12-27 ENCOUNTER — Ambulatory Visit: Payer: Managed Care, Other (non HMO)

## 2017-01-09 ENCOUNTER — Ambulatory Visit: Payer: Managed Care, Other (non HMO) | Admitting: Gynecology

## 2017-01-10 ENCOUNTER — Ambulatory Visit
Admission: RE | Admit: 2017-01-10 | Discharge: 2017-01-10 | Disposition: A | Discharge status: Home / Self Care | Payer: Managed Care, Other (non HMO) | Source: Ambulatory Visit | Attending: Gynecology | Admitting: Gynecology

## 2017-01-10 DIAGNOSIS — Z1231 Encounter for screening mammogram for malignant neoplasm of breast: Secondary | ICD-10-CM

## 2017-02-04 ENCOUNTER — Other Ambulatory Visit: Payer: Self-pay | Admitting: Women's Health

## 2017-02-04 DIAGNOSIS — F4322 Adjustment disorder with anxiety: Secondary | ICD-10-CM

## 2017-02-04 NOTE — Telephone Encounter (Signed)
Ok for refill? 

## 2017-02-04 NOTE — Telephone Encounter (Signed)
Called into pharmacy

## 2017-09-17 ENCOUNTER — Other Ambulatory Visit: Payer: Self-pay | Admitting: Women's Health

## 2017-09-17 DIAGNOSIS — F4322 Adjustment disorder with anxiety: Secondary | ICD-10-CM

## 2017-09-17 NOTE — Telephone Encounter (Signed)
Called into pharmacy

## 2017-09-17 NOTE — Telephone Encounter (Signed)
Ok for refill? 

## 2017-12-15 ENCOUNTER — Other Ambulatory Visit: Payer: Self-pay

## 2017-12-15 ENCOUNTER — Ambulatory Visit (INDEPENDENT_AMBULATORY_CARE_PROVIDER_SITE_OTHER): Payer: Managed Care, Other (non HMO) | Admitting: Obstetrics and Gynecology

## 2017-12-15 ENCOUNTER — Encounter: Payer: Self-pay | Admitting: Obstetrics and Gynecology

## 2017-12-15 ENCOUNTER — Other Ambulatory Visit (HOSPITAL_COMMUNITY)
Admission: RE | Admit: 2017-12-15 | Discharge: 2017-12-15 | Disposition: A | Discharge status: Home / Self Care | Payer: Managed Care, Other (non HMO) | Source: Ambulatory Visit | Attending: Obstetrics and Gynecology | Admitting: Obstetrics and Gynecology

## 2017-12-15 VITALS — BP 130/78 | HR 84 | Resp 16 | Ht 66.0 in | Wt 247.0 lb

## 2017-12-15 DIAGNOSIS — E559 Vitamin D deficiency, unspecified: Secondary | ICD-10-CM | POA: Diagnosis not present

## 2017-12-15 DIAGNOSIS — Z803 Family history of malignant neoplasm of breast: Secondary | ICD-10-CM

## 2017-12-15 DIAGNOSIS — R87610 Atypical squamous cells of undetermined significance on cytologic smear of cervix (ASC-US): Secondary | ICD-10-CM | POA: Insufficient documentation

## 2017-12-15 DIAGNOSIS — Z01419 Encounter for gynecological examination (general) (routine) without abnormal findings: Secondary | ICD-10-CM | POA: Diagnosis not present

## 2017-12-15 DIAGNOSIS — Z124 Encounter for screening for malignant neoplasm of cervix: Secondary | ICD-10-CM | POA: Insufficient documentation

## 2017-12-15 DIAGNOSIS — E669 Obesity, unspecified: Secondary | ICD-10-CM

## 2017-12-15 DIAGNOSIS — Z0000 Encounter for general adult medical examination without abnormal findings: Secondary | ICD-10-CM

## 2017-12-15 DIAGNOSIS — Z1211 Encounter for screening for malignant neoplasm of colon: Secondary | ICD-10-CM

## 2017-12-15 DIAGNOSIS — Z800 Family history of malignant neoplasm of digestive organs: Secondary | ICD-10-CM

## 2017-12-15 DIAGNOSIS — B977 Papillomavirus as the cause of diseases classified elsewhere: Secondary | ICD-10-CM

## 2017-12-15 NOTE — Patient Instructions (Signed)

## 2017-12-15 NOTE — Progress Notes (Signed)
52 y.o. [id number] DivorcedCaucasianF here for annual exam.  Not currently sexually active.  Period Cycle (Days): 28 Period Duration (Days): 4-5 days  Period Pattern: Regular Menstrual Flow: Heavy, Moderate Menstrual Control: Maxi pad, Thin pad Menstrual Control Change Freq (Hours): changes pad every 2 hours  Dysmenorrhea: None  Patient's last menstrual period was 12/08/2017.          Sexually active: No.  The current method of family planning is none.    Exercising: Yes.    walking Smoker:  no  Health Maintenance: Pap:  12-17-16 ASCUS + HR HPV  History of abnormal Pap:  Yes 2018 ASCUS + HR HPV- did not follow up. H/O cryosurgery at 38.  MMG:  01-10-17 WNL  Colonoscopy:  Never BMD:   Never TDaP:  2012 Gardasil: N/A   reports that  has never smoked. she has never used smokeless tobacco. She reports that she drinks about 2.4 - 3.0 oz of alcohol per week. She reports that she does not use drugs.She is Therapist, art at [employer]. 62 kids, 78 year old daughter, lives locally. Son is 1, junior in [employer].   History reviewed. No pertinent past medical history.  Past Surgical History:  Procedure Laterality Date  . CESAREAN SECTION      Current Outpatient Medications  Medication Sig Dispense Refill  . ALPRAZolam (XANAX) 0.25 MG tablet TAKE ONE TABLET BY MOUTH AT BEDTIME AS NEEDED FOR ANXIETY 30 tablet 1  . Vitamin D, Ergocalciferol, 2000 units CAPS Take by mouth.     No current facility-administered medications for this visit.     Family History  Problem Relation Age of Onset  . Breast cancer Mother   . Prostate cancer Father     Review of Systems  Constitutional: Negative.   HENT: Negative.   Eyes: Negative.   Respiratory: Negative.   Cardiovascular: Negative.   Gastrointestinal: Negative.   Endocrine: Negative.   Genitourinary: Negative.   Musculoskeletal: Negative.   Skin: Negative.   Allergic/Immunologic: Negative.   Neurological: Negative.    Psychiatric/Behavioral: Negative.     Exam:   BP 130/78 (BP Location: Right Arm, Patient Position: Sitting, Cuff Size: Large)   Pulse 84   Resp 16   Ht 5\' 6"  (1.676 m)   Wt 247 lb (112 kg)   LMP 12/08/2017   BMI 39.87 kg/m   Weight change: @WEIGHTCHANGE @ Height:   Height: 5\' 6"  (167.6 cm)  Ht Readings from Last 3 Encounters:  12/15/17 5\' 6"  (1.676 m)  12/17/16 5\' 6"  (1.676 m)  09/26/15 5\' 6"  (1.676 m)    General appearance: alert, cooperative and appears stated age Head: Normocephalic, without obvious abnormality, atraumatic Neck: no adenopathy, supple, symmetrical, trachea midline and thyroid normal to inspection and palpation Lungs: clear to auscultation bilaterally Cardiovascular: regular rate and rhythm Breasts: normal appearance, no masses or tenderness Abdomen: soft, non-tender; non distended,  no masses,  no organomegaly Extremities: extremities normal, atraumatic, no cyanosis or edema Skin: Skin color, texture, turgor normal. No rashes or lesions Lymph nodes: Cervical, supraclavicular, and axillary nodes normal. No abnormal inguinal nodes palpated Neurologic: Grossly normal   Pelvic: External genitalia:  no lesions              Urethra:  normal appearing urethra with no masses, tenderness or lesions              Bartholins and Skenes: normal                 Vagina:  normal appearing vagina with normal color and discharge, no lesions              Cervix: no lesions               Bimanual Exam:  Uterus:  normal size, contour, position, consistency, mobility, non-tender and anteverted              Adnexa: no mass, fullness, tenderness               Rectovaginal: Confirms               Anus:  normal sphincter tone, no lesions  Chaperone was present for exam.  A:  Well Woman with normal exam  Obesity  H/o abnormal pap last year, ASCUS/+HPV, she didn't return for f/u    P:   Discussed colon cancer screening, will set up colonoscopy  Pap today with hpv  Discussed  breast self exam  Discussed calcium and vit D intake  Mammogram next month (recommended 3D)  Screening labs, including HgbA1C, TSH  Vit d level  Given names of Primary MD and Dermatology (facial rash)

## 2017-12-16 LAB — COMPREHENSIVE METABOLIC PANEL
ALT: 46 IU/L — ABNORMAL HIGH (ref 0–32)
AST: 32 IU/L (ref 0–40)
Albumin/Globulin Ratio: 2 (ref 1.2–2.2)
Albumin: 4.7 g/dL (ref 3.5–5.5)
Alkaline Phosphatase: 53 IU/L (ref 39–117)
BUN/Creatinine Ratio: 19 (ref 9–23)
BUN: 14 mg/dL (ref 6–24)
Bilirubin Total: 0.4 mg/dL (ref 0.0–1.2)
CO2: 22 mmol/L (ref 20–29)
Calcium: 9.6 mg/dL (ref 8.7–10.2)
Chloride: 99 mmol/L (ref 96–106)
Creatinine, Ser: 0.72 mg/dL (ref 0.57–1.00)
GFR calc Af Amer: 113 mL/min/{1.73_m2} (ref >59)
GFR calc non Af Amer: 98 mL/min/{1.73_m2} (ref >59)
Globulin, Total: 2.4 g/dL (ref 1.5–4.5)
Glucose: 97 mg/dL (ref 65–99)
Potassium: 4.2 mmol/L (ref 3.5–5.2)
Sodium: 139 mmol/L (ref 134–144)
Total Protein: 7.1 g/dL (ref 6.0–8.5)

## 2017-12-16 LAB — LIPID PANEL
Chol/HDL Ratio: 3.5 ratio (ref 0.0–4.4)
Cholesterol, Total: 199 mg/dL (ref 100–199)
HDL: 57 mg/dL (ref >39)
LDL Calculated: 94 mg/dL (ref 0–99)
Triglycerides: 242 mg/dL — ABNORMAL HIGH (ref 0–149)
VLDL Cholesterol Cal: 48 mg/dL — ABNORMAL HIGH (ref 5–40)

## 2017-12-16 LAB — CBC
Hematocrit: 39.2 % (ref 34.0–46.6)
Hemoglobin: 13.1 g/dL (ref 11.1–15.9)
MCH: 31 pg (ref 26.6–33.0)
MCHC: 33.4 g/dL (ref 31.5–35.7)
MCV: 93 fL (ref 79–97)
Platelets: 360 10*3/uL (ref 150–379)
RBC: 4.22 x10E6/uL (ref 3.77–5.28)
RDW: 13.4 % (ref 12.3–15.4)
WBC: 7.8 10*3/uL (ref 3.4–10.8)

## 2017-12-16 LAB — HEMOGLOBIN A1C
Est. average glucose Bld gHb Est-mCnc: 123 mg/dL
Hgb A1c MFr Bld: 5.9 % — ABNORMAL HIGH (ref 4.8–5.6)

## 2017-12-16 LAB — TSH: TSH: 1.38 u[IU]/mL (ref 0.450–4.500)

## 2017-12-16 LAB — VITAMIN D 25 HYDROXY (VIT D DEFICIENCY, FRACTURES): Vit D, 25-Hydroxy: 13.5 ng/mL — ABNORMAL LOW (ref 30.0–100.0)

## 2017-12-17 ENCOUNTER — Telehealth: Payer: Self-pay | Admitting: *Deleted

## 2017-12-17 ENCOUNTER — Encounter: Payer: Self-pay | Admitting: Gastroenterology

## 2017-12-17 LAB — CYTOLOGY - PAP
Diagnosis: UNDETERMINED — AB
HPV: DETECTED — AB

## 2017-12-17 MED ORDER — VITAMIN D (ERGOCALCIFEROL) 1.25 MG (50000 UNIT) PO CAPS: 50000.0000 [IU] | ORAL_CAPSULE | ORAL | 0 refills | Status: DC

## 2017-12-17 NOTE — Telephone Encounter (Signed)
Spoke with patient and gave results and recommendations. RX for Vitamin D sent to [facility] per patients request. Advised patient to follow up with PCP on labs. Patient voiced understanding. Les Pou

## 2017-12-17 NOTE — Telephone Encounter (Signed)
-----   Message from Salvadore Dom, MD sent at 12/16/2017  6:24 PM EST ----- Please let the patient know that her ALT (liver function test) is slightly elevated, she has pre-diabetes, her triglycerides and VLDL were both elevated and her vit d was very low. Please call in 50,000 IU of vit D to take weekly for the next 12 weeks, then she should have another vit d level checked.  I would recommend a mediterranean diet and regular exercise.  A mediterranean diet is high in fruits, vegetables, whole grains, fish, chicken, nuts, healthy fats (olive oil or canola oil). Low fat dairy. Limit butter, margarine, red meat and sweets.   She needs to establish care with a primary, names were given yesterday. Please check if she has made an appointment yet and forward a copy of the labs.  Pap is pending.

## 2017-12-17 NOTE — Telephone Encounter (Signed)
Left message to call regarding lab results -eh 

## 2017-12-18 ENCOUNTER — Telehealth: Payer: Self-pay

## 2017-12-18 DIAGNOSIS — R87810 Cervical high risk human papillomavirus (HPV) DNA test positive: Secondary | ICD-10-CM

## 2017-12-18 DIAGNOSIS — R87610 Atypical squamous cells of undetermined significance on cytologic smear of cervix (ASC-US): Secondary | ICD-10-CM

## 2017-12-18 NOTE — Telephone Encounter (Signed)
Patient has an ASCUS pap with positive HPV testing. Needs a colposcopy with Dr.Jertson.   Left message to call [place] at [phone].

## 2017-12-18 NOTE — Telephone Encounter (Signed)
Spoke with patient. Results given. Patient verbalizes understanding. Patient is not sexually active. LMP 12/08/2017. Colposcopy scheduled for 12/26/2017 at 9 am with Dr.Jertson. Aware if she becomes sexually active this will need to be rescheduled.  Instructions given. Motrin 800 mg po x , one hour before appointment with food. Make sure to eat a meal before appointment and drink plenty of fluids. Patient verbalized understanding and will call to reschedule if will be on menses or has any concerns regarding pregnancy. Patient agreeable and verbalized understanding of all instructions. Order placed.  Encounter closed.

## 2017-12-18 NOTE — Telephone Encounter (Signed)
-----   Message from Salvadore Dom, MD sent at 12/17/2017  9:41 AM EST ----- Please inform and set the patient up for a colposcopy

## 2017-12-22 ENCOUNTER — Telehealth: Payer: Self-pay | Admitting: Obstetrics and Gynecology

## 2017-12-22 NOTE — Telephone Encounter (Signed)
Spoke with patient. Patient states that she has a colposcopy Friday and is worried about taking Ibuprofen or Motrin before her appointment. States she had an elevated ALT level. Reports she was drinking often and has completely cut that out. Does not take a lot of Ibuprofen or Motrin products. Advised okay to take 800 mg dose this one time. Advised daily use is not recommended as it can alter liver function. Patient verbalizes understanding.   Routing to provider for final review. Patient agreeable to disposition. Will close encounter.

## 2017-12-22 NOTE — Telephone Encounter (Signed)
Patient has a question for nurse about the procedure appointment Friday.

## 2017-12-26 ENCOUNTER — Encounter: Payer: Self-pay | Admitting: Obstetrics and Gynecology

## 2017-12-26 ENCOUNTER — Ambulatory Visit: Payer: Managed Care, Other (non HMO) | Admitting: Obstetrics and Gynecology

## 2017-12-26 ENCOUNTER — Other Ambulatory Visit: Payer: Self-pay

## 2017-12-26 VITALS — BP 130/80 | HR 84 | Resp 16 | Wt 240.0 lb

## 2017-12-26 DIAGNOSIS — R87610 Atypical squamous cells of undetermined significance on cytologic smear of cervix (ASC-US): Secondary | ICD-10-CM | POA: Diagnosis not present

## 2017-12-26 DIAGNOSIS — R87810 Cervical high risk human papillomavirus (HPV) DNA test positive: Secondary | ICD-10-CM

## 2017-12-26 DIAGNOSIS — Z01812 Encounter for preprocedural laboratory examination: Secondary | ICD-10-CM

## 2017-12-26 HISTORY — PX: CERVICAL BIOPSY  W/ LOOP ELECTRODE EXCISION: SUR135

## 2017-12-26 LAB — POCT URINE PREGNANCY: Preg Test, Ur: NEGATIVE

## 2017-12-26 NOTE — Progress Notes (Signed)
GYNECOLOGY  VISIT   HPI: 52 y.o.   Divorced  Caucasian  female   [phone] with Patient's last menstrual period was 12/08/2017.   here for a colposcopy. Recent pap with ASCUS/+HPV, she had the same pap results last year (not evaluated, patient didn't get f/u).  GYNECOLOGIC HISTORY: Patient's last menstrual period was 12/08/2017. Contraception: none- Not sexually active at this time Menopausal hormone therapy: none         OB History    Gravida Para Term Preterm AB Living   3 2 2   1 2    SAB TAB Ectopic Multiple Live Births   1       2         There are no active problems to display for this patient.   History reviewed. No pertinent past medical history.  Past Surgical History:  Procedure Laterality Date  . CESAREAN SECTION      Current Outpatient Medications  Medication Sig Dispense Refill  . ALPRAZolam (XANAX) 0.25 MG tablet TAKE ONE TABLET BY MOUTH AT BEDTIME AS NEEDED FOR ANXIETY 30 tablet 1  . Vitamin D, Ergocalciferol, (DRISDOL) 50000 units CAPS capsule Take 1 capsule (50,000 Units total) by mouth every 7 (seven) days. 12 capsule 0  . Vitamin D, Ergocalciferol, 2000 units CAPS Take by mouth.     No current facility-administered medications for this visit.      ALLERGIES: Patient has no known allergies.  Family History  Problem Relation Age of Onset  . Breast cancer Mother        in her early 31's, now in her 65's  . Prostate cancer Father   . Colon cancer Maternal Grandmother 31    Social History   Socioeconomic History  . Marital status: Divorced    Spouse name: Not on file  . Number of children: Not on file  . Years of education: Not on file  . Highest education level: Not on file  Social Needs  . Financial resource strain: Not on file  . Food insecurity - worry: Not on file  . Food insecurity - inability: Not on file  . Transportation needs - medical: Not on file  . Transportation needs - non-medical: Not on file  Occupational History  . Not on  file  Tobacco Use  . Smoking status: Never Smoker  . Smokeless tobacco: Never Used  Substance and Sexual Activity  . Alcohol use: Yes    Alcohol/week: 2.4 - 3.0 oz    Types: 4 - 5 Standard drinks or equivalent per week  . Drug use: No  . Sexual activity: Not Currently    Partners: Male  Other Topics Concern  . Not on file  Social History Narrative  . Not on file    Review of Systems  Constitutional: Negative.   HENT: Negative.   Eyes: Negative.   Respiratory: Negative.   Cardiovascular: Negative.   Gastrointestinal: Negative.   Genitourinary: Negative.   Musculoskeletal: Negative.   Skin: Negative.   Neurological: Negative.   Endo/Heme/Allergies: Negative.   Psychiatric/Behavioral: Negative.     PHYSICAL EXAMINATION:    BP 130/80 (BP Location: Right Arm, Patient Position: Sitting, Cuff Size: Normal)   Pulse 84   Resp 16   Wt 240 lb (108.9 kg)   LMP 12/08/2017   BMI 38.74 kg/m     General appearance: alert, cooperative and appears stated age  Pelvic: External genitalia:  no lesions  Urethra:  normal appearing urethra with no masses, tenderness or lesions              Bartholins and Skenes: normal                 Vagina: normal appearing vagina with normal color and discharge, no lesions              Cervix: grossly normal  Colposcopy: satisfactory, aceto-white changes with mosaics anteriorly and posteriorly, biopsies taken at 5 and 12 o'clock, ECC done. Negative lugols examination of the vagina. Bleeding stopped with silver nitrate and monsels   Chaperone was present for exam.  ASSESSMENT ASCUS, +HPV pap Discussed abnormal paps    PLAN Colposcopy with biopsies and ECC Further plan depending on the results Discussed the possibility of a LEEP, briefly reviewed the procedure   An After Visit Summary was printed and given to the patient.

## 2017-12-26 NOTE — Patient Instructions (Signed)

## 2018-01-02 ENCOUNTER — Telehealth: Payer: Self-pay

## 2018-01-02 DIAGNOSIS — N871 Moderate cervical dysplasia: Secondary | ICD-10-CM

## 2018-01-02 NOTE — Telephone Encounter (Signed)
-----   Message from Lindsay Dom, MD sent at 12/31/2017 12:40 PM EST ----- Please inform the patient that one of her biopsies returned with CIN II and set her up for a LEEP, ECC was negative.

## 2018-01-02 NOTE — Telephone Encounter (Signed)
Spoke with patient. Results given. Patient verbalizes understanding. Patient is not sexually active. LEEP scheduled for 01/13/2018 at 1:30 pm with Dr.Jertson. Declines all other days due to work. Pre procedure instructions given.  Motrin instructions given. Motrin=Advil=Ibuprofen, 800 mg one hour before appointment. Eat a meal and hydrate well before appointment. Advised she will need to take it easy for 24 hours after her LEEP procedure. Patient verbalizes understanding. LEEP ordered.  Routing to provider for final review. Patient agreeable to disposition. Will close encounter.

## 2018-01-06 ENCOUNTER — Telehealth: Payer: Self-pay

## 2018-01-06 NOTE — Telephone Encounter (Signed)
Left message to call [place] at [phone].   Need to see if patient can move LEEP appointment on 01/12/2018 to 4:30 pm with Dr.Jertson.

## 2018-01-13 ENCOUNTER — Ambulatory Visit (INDEPENDENT_AMBULATORY_CARE_PROVIDER_SITE_OTHER): Payer: Managed Care, Other (non HMO) | Admitting: Obstetrics and Gynecology

## 2018-01-13 ENCOUNTER — Encounter: Payer: Self-pay | Admitting: Obstetrics and Gynecology

## 2018-01-13 ENCOUNTER — Other Ambulatory Visit: Payer: Self-pay

## 2018-01-13 VITALS — BP 128/68 | HR 76 | Resp 14 | Wt 233.0 lb

## 2018-01-13 DIAGNOSIS — N871 Moderate cervical dysplasia: Secondary | ICD-10-CM | POA: Diagnosis not present

## 2018-01-13 DIAGNOSIS — Z01812 Encounter for preprocedural laboratory examination: Secondary | ICD-10-CM

## 2018-01-13 LAB — POCT URINE PREGNANCY: Preg Test, Ur: NEGATIVE

## 2018-01-13 NOTE — Progress Notes (Signed)
GYNECOLOGY  VISIT   HPI: 52 y.o.   Divorced  Caucasian  female   [phone] with Patient's last menstrual period was 01/06/2018.   here for a LEEP, recent colposcopy with CIN II  GYNECOLOGIC HISTORY: Patient's last menstrual period was 01/06/2018. Contraception:none, not sexually active Menopausal hormone therapy: none         OB History    Gravida Para Term Preterm AB Living   3 2 2   1 2    SAB TAB Ectopic Multiple Live Births   1       2         There are no active problems to display for this patient.   History reviewed. No pertinent past medical history.  Past Surgical History:  Procedure Laterality Date  . CESAREAN SECTION      Current Outpatient Medications  Medication Sig Dispense Refill  . ALPRAZolam (XANAX) 0.25 MG tablet TAKE ONE TABLET BY MOUTH AT BEDTIME AS NEEDED FOR ANXIETY 30 tablet 1  . Vitamin D, Ergocalciferol, (DRISDOL) 50000 units CAPS capsule Take 1 capsule (50,000 Units total) by mouth every 7 (seven) days. 12 capsule 0  . Vitamin D, Ergocalciferol, 2000 units CAPS Take by mouth.     No current facility-administered medications for this visit.      ALLERGIES: Patient has no known allergies.  Family History  Problem Relation Age of Onset  . Breast cancer Mother        in her early 42's, now in her 1's  . Prostate cancer Father   . Colon cancer Maternal Grandmother 7    Social History   Socioeconomic History  . Marital status: Divorced    Spouse name: Not on file  . Number of children: Not on file  . Years of education: Not on file  . Highest education level: Not on file  Social Needs  . Financial resource strain: Not on file  . Food insecurity - worry: Not on file  . Food insecurity - inability: Not on file  . Transportation needs - medical: Not on file  . Transportation needs - non-medical: Not on file  Occupational History  . Not on file  Tobacco Use  . Smoking status: Never Smoker  . Smokeless tobacco: Never Used  Substance  and Sexual Activity  . Alcohol use: Yes    Alcohol/week: 2.4 - 3.0 oz    Types: 4 - 5 Standard drinks or equivalent per week  . Drug use: No  . Sexual activity: Not Currently    Partners: Male  Other Topics Concern  . Not on file  Social History Narrative  . Not on file    Review of Systems  Constitutional: Negative.   HENT: Negative.   Eyes: Negative.   Respiratory: Negative.   Cardiovascular: Negative.   Gastrointestinal: Negative.   Genitourinary: Negative.   Musculoskeletal: Negative.   Skin: Negative.   Neurological: Negative.   Endo/Heme/Allergies: Negative.   Psychiatric/Behavioral: Negative.     PHYSICAL EXAMINATION:    BP 128/68 (BP Location: Right Arm, Patient Position: Sitting, Cuff Size: Normal)   Pulse 76   Resp 14   Wt 233 lb (105.7 kg)   LMP 01/06/2018   BMI 37.61 kg/m     General appearance: alert, cooperative and appears stated age  Pelvic: External genitalia:  no lesions              Urethra:  normal appearing urethra with no masses, tenderness or lesions  Bartholins and Skenes: normal                 Vagina: normal appearing vagina with normal color and discharge, no lesions              Cervix:no gross lesions  Procedure: The patient was counseled as to the risks of the procedure, including: infection, bleeding, future pregnancy risks and cervical stenosis. A consent form was signed.  Acetic acid was placed on the cervix and the colposcopy was satisfactory. Under colposcopic guidance, Lugols solution was placed on the cervix and a paracervical block was injected using 1% lidocaine with epinephrine. Under colposcopic guidance, the 2 x 0.8  cm loop was used to remove a portion of the ectocervix taking care to get the entire transformation zone. This was done in 2 passes  The settings were 55 cut, 50 coag with a blend of 1.  An ECC was performed. The cautery ball was then used to cauterize the base of the biopsy site and monsels were  placed. The patient tolerated the procedure well.   Chaperone was present for exam.  ASSESSMENT CIN II    PLAN LEEP done F/U in one month   An After Visit Summary was printed and given to the patient.

## 2018-01-13 NOTE — Patient Instructions (Signed)

## 2018-01-14 NOTE — Telephone Encounter (Signed)
Patient was seen on 01/13/2018 for LEEP with Dr.Jertson. See OV note. Encounter closed.

## 2018-01-16 ENCOUNTER — Telehealth: Payer: Self-pay

## 2018-01-16 NOTE — Telephone Encounter (Signed)
-----   Message from Lindsay Dom, MD sent at 01/15/2018  5:16 PM EST ----- Please let the patient know that her leep returned with HSIL, negative margins and negative ECC. She will need another pap and hpv at her next annual exam in 1/20

## 2018-01-16 NOTE — Telephone Encounter (Signed)
Left message to call [place] at [phone]. 

## 2018-01-23 NOTE — Telephone Encounter (Signed)
Spoke with patient, advised of results as seen below per Dr. Talbert Nan. Reports watery d/c since LEEP procedure. Denies fever/chills, N/V, odor, color, pain or bleeding. Advised to continue to monitor, vaginal d/c is expected after LEEP, return call to office should new symptoms develop. Keep f/u scheduled for 3/20 with Dr. Talbert Nan. Patient verbalizes understanding and is agreeable.   Routing to provider for final review. Patient is agreeable to disposition. Will close encounter.   08 recall placed.

## 2018-01-26 ENCOUNTER — Other Ambulatory Visit: Payer: Self-pay | Admitting: Obstetrics and Gynecology

## 2018-01-26 DIAGNOSIS — Z1231 Encounter for screening mammogram for malignant neoplasm of breast: Secondary | ICD-10-CM

## 2018-01-27 ENCOUNTER — Ambulatory Visit
Admission: RE | Admit: 2018-01-27 | Discharge: 2018-01-27 | Disposition: A | Discharge status: Home / Self Care | Payer: Managed Care, Other (non HMO) | Source: Ambulatory Visit | Attending: Obstetrics and Gynecology | Admitting: Obstetrics and Gynecology

## 2018-01-27 DIAGNOSIS — Z1231 Encounter for screening mammogram for malignant neoplasm of breast: Secondary | ICD-10-CM

## 2018-02-10 ENCOUNTER — Encounter: Payer: Self-pay | Admitting: Gastroenterology

## 2018-02-10 ENCOUNTER — Ambulatory Visit (AMBULATORY_SURGERY_CENTER): Payer: Self-pay | Admitting: *Deleted

## 2018-02-10 ENCOUNTER — Other Ambulatory Visit: Payer: Self-pay

## 2018-02-10 DIAGNOSIS — Z1211 Encounter for screening for malignant neoplasm of colon: Secondary | ICD-10-CM

## 2018-02-10 MED ORDER — NA SULFATE-K SULFATE-MG SULF 17.5-3.13-1.6 GM/177ML PO SOLN: ORAL | 0 refills | Status: DC

## 2018-02-10 NOTE — Progress Notes (Signed)
No egg or soy allergy  No trouble with anesthesia or trouble moving neck  No diet medications taken  No home oxygen used or hx of sleep apnea  Registered in [facility]

## 2018-02-11 ENCOUNTER — Other Ambulatory Visit: Payer: Self-pay

## 2018-02-11 ENCOUNTER — Ambulatory Visit: Payer: Managed Care, Other (non HMO) | Admitting: Obstetrics and Gynecology

## 2018-02-11 ENCOUNTER — Encounter: Payer: Self-pay | Admitting: Obstetrics and Gynecology

## 2018-02-11 VITALS — BP 118/70 | HR 80 | Resp 16 | Wt 227.0 lb

## 2018-02-11 DIAGNOSIS — Z9889 Other specified postprocedural states: Secondary | ICD-10-CM | POA: Diagnosis not present

## 2018-02-11 DIAGNOSIS — Z87410 Personal history of cervical dysplasia: Secondary | ICD-10-CM

## 2018-02-11 NOTE — Progress Notes (Signed)
GYNECOLOGY  VISIT   HPI: 52 y.o.   Divorced  Caucasian  female   [phone] with Patient's last menstrual period was 02/10/2018.   here for follow up from LEEP. She is doing well, had a funny d/c for a few weeks, just finishing a normal cycle.     1. Cervix, LEEP, exo - HIGH GRADE SQUAMOUS INTRAEPITHELIAL LESION (CIN-II, MODERATE SQUAMOUS DYSPLASIA) IN CERVICAL TRANSFORMATION ZONE MUCOSA. - NO INVASIVE PROCESS IDENTIFIED. - SURGICAL RESECTION MARGINS ARE FREE OF THE LESION. 2. Endocervix, curettage - FRAGMENTS OF BENIGN ENDOCERVICAL GLANDULAR TISSUE. - NO ATYPIA, DYSPLASIA, OR MALIGNANCY IDENTIFIED.  GYNECOLOGIC HISTORY: Patient's last menstrual period was 02/10/2018. Contraception:none Menopausal hormone therapy: none         OB History    Gravida Para Term Preterm AB Living   3 2 2   1 2    SAB TAB Ectopic Multiple Live Births   1       2         There are no active problems to display for this patient.   Past Medical History:  Diagnosis Date  . Allergy    seasonal    Past Surgical History:  Procedure Laterality Date  . CESAREAN SECTION    . DILATION AND CURETTAGE OF UTERUS      Current Outpatient Medications  Medication Sig Dispense Refill  . ALPRAZolam (XANAX) 0.25 MG tablet TAKE ONE TABLET BY MOUTH AT BEDTIME AS NEEDED FOR ANXIETY 30 tablet 1  . Vitamin D, Ergocalciferol, (DRISDOL) 50000 units CAPS capsule Take 1 capsule (50,000 Units total) by mouth every 7 (seven) days. 12 capsule 0  . Na Sulfate-K Sulfate-Mg Sulf (SUPREP BOWEL PREP KIT) 17.5-3.13-1.6 GM/177ML SOLN Suprep as directed, no substitutions (Patient not taking: Reported on 02/11/2018) 354 mL 0  . Vitamin D, Ergocalciferol, 2000 units CAPS Take by mouth.     No current facility-administered medications for this visit.      ALLERGIES: Patient has no known allergies.  Family History  Problem Relation Age of Onset  . Breast cancer Mother        in her early 50's, now in her 69's  . Prostate cancer  Father   . Colon cancer Maternal Grandmother 72  . Breast cancer Cousin        in 58's  . Esophageal cancer Maternal Grandfather   . Rectal cancer Neg Hx   . Stomach cancer Neg Hx     Social History   Socioeconomic History  . Marital status: Divorced    Spouse name: Not on file  . Number of children: Not on file  . Years of education: Not on file  . Highest education level: Not on file  Social Needs  . Financial resource strain: Not on file  . Food insecurity - worry: Not on file  . Food insecurity - inability: Not on file  . Transportation needs - medical: Not on file  . Transportation needs - non-medical: Not on file  Occupational History  . Not on file  Tobacco Use  . Smoking status: Never Smoker  . Smokeless tobacco: Never Used  Substance and Sexual Activity  . Alcohol use: Yes    Alcohol/week: 2.4 - 3.0 oz    Types: 4 - 5 Standard drinks or equivalent per week  . Drug use: No  . Sexual activity: Not Currently    Partners: Male  Other Topics Concern  . Not on file  Social History Narrative  . Not on file  Review of Systems  Constitutional: Negative.   HENT: Negative.   Eyes: Negative.   Respiratory: Negative.   Cardiovascular: Negative.   Gastrointestinal: Negative.   Genitourinary: Negative.   Musculoskeletal: Negative.   Skin: Negative.   Neurological: Negative.   Endo/Heme/Allergies: Negative.   Psychiatric/Behavioral: Negative.     PHYSICAL EXAMINATION:    BP 118/70 (BP Location: Right Arm, Patient Position: Sitting, Cuff Size: Normal)   Pulse 80   Resp 16   Wt 227 lb (103 kg)   LMP 02/10/2018   BMI 36.64 kg/m     General appearance: alert, cooperative and appears stated age  Pelvic: External genitalia:  no lesions              Urethra:  normal appearing urethra with no masses, tenderness or lesions              Bartholins and Skenes: normal                 Vagina: normal appearing vagina with normal color and discharge, no lesions               Cervix: healing well, still slightly friable appearing  Chaperone was present for exam.  ASSESSMENT S/P LEEP for HSIL, pathology with CIN II, negative margins and ECC Healing well, slightly friable    PLAN Call with any concerns, if having intermenstrual bleeding or postcoital bleeding f/u Otherwise f/u for an annual exam in 1/20   An After Visit Summary was printed and given to the patient.

## 2018-02-24 ENCOUNTER — Encounter: Payer: Self-pay | Admitting: Gastroenterology

## 2018-02-24 ENCOUNTER — Ambulatory Visit (AMBULATORY_SURGERY_CENTER): Payer: Managed Care, Other (non HMO) | Admitting: Gastroenterology

## 2018-02-24 VITALS — BP 123/78 | HR 77 | Temp 98.9°F | Resp 16 | Ht 66.0 in | Wt 227.0 lb

## 2018-02-24 DIAGNOSIS — Z1211 Encounter for screening for malignant neoplasm of colon: Secondary | ICD-10-CM | POA: Diagnosis present

## 2018-02-24 DIAGNOSIS — K621 Rectal polyp: Secondary | ICD-10-CM

## 2018-02-24 DIAGNOSIS — D128 Benign neoplasm of rectum: Secondary | ICD-10-CM

## 2018-02-24 DIAGNOSIS — D123 Benign neoplasm of transverse colon: Secondary | ICD-10-CM | POA: Diagnosis not present

## 2018-02-24 DIAGNOSIS — Z1212 Encounter for screening for malignant neoplasm of rectum: Secondary | ICD-10-CM | POA: Diagnosis not present

## 2018-02-24 MED ORDER — SODIUM CHLORIDE 0.9 % IV SOLN: 500.0000 mL | Freq: Once | INTRAVENOUS | Status: DC

## 2018-02-24 NOTE — Progress Notes (Signed)
Called to room to assist during endoscopic procedure.  Patient ID and intended procedure confirmed with present staff. Received instructions for my participation in the procedure from the performing physician.  

## 2018-02-24 NOTE — Op Note (Signed)
[place] Patient Name: Lindsay Hampton Procedure Date: 02/24/2018 8:39 AM MRN: [id number] Endoscopist: Mauri Pole , MD Age: 52 Referring MD:  Date of Birth: June 26, 1966 Gender: Female Account #: [id number] Procedure:                Colonoscopy Indications:              Screening for colorectal malignant neoplasm, This                            is the patient's first colonoscopy Medicines:                Monitored Anesthesia Care Procedure:                Pre-Anesthesia Assessment:                           - Prior to the procedure, a History and Physical                            was performed, and patient medications and                            allergies were reviewed. The patient's tolerance of                            previous anesthesia was also reviewed. The risks                            and benefits of the procedure and the sedation                            options and risks were discussed with the patient.                            All questions were answered, and informed consent                            was obtained. Prior Anticoagulants: The patient has                            taken no previous anticoagulant or antiplatelet                            agents. ASA Grade Assessment: II - A patient with                            mild systemic disease. After reviewing the risks                            and benefits, the patient was deemed in                            satisfactory condition to undergo the procedure.  After obtaining informed consent, the colonoscope                            was passed under direct vision. Throughout the                            procedure, the patient's blood pressure, pulse, and                            oxygen saturations were monitored continuously. The                            Model PCF-H190DL [id number]) scope was introduced                            through the anus and  advanced to the the terminal                            ileum, with identification of the appendiceal                            orifice and IC valve. The colonoscopy was performed                            without difficulty. The patient tolerated the                            procedure well. The quality of the bowel                            preparation was excellent. The terminal ileum,                            ileocecal valve, appendiceal orifice, and rectum                            were photographed. Scope In: 8:49:23 AM Scope Out: 9:11:29 AM Scope Withdrawal Time: 0 hours 16 minutes 41 seconds  Total Procedure Duration: 0 hours 22 minutes 6 seconds  Findings:                 The perianal and digital rectal examinations were                            normal.                           Two sessile polyps were found in the transverse                            colon. The polyps were 1 to 2 mm in size. These                            polyps were removed with a cold biopsy forceps.  Resection and retrieval were complete.                           Two sessile polyps were found in the rectum and                            transverse colon. The polyps were 4 to 5 mm in                            size. These polyps were removed with a cold snare.                            Resection and retrieval were complete.                           A few small-mouthed diverticula were found in the                            sigmoid colon and descending colon.                           Non-bleeding internal hemorrhoids were found during                            retroflexion. The hemorrhoids were small.                           The exam was otherwise without abnormality. Complications:            No immediate complications. Estimated Blood Loss:     Estimated blood loss was minimal. Impression:               - Two 1 to 2 mm polyps in the transverse colon,                             removed with a cold biopsy forceps. Resected and                            retrieved.                           - Two 4 to 5 mm polyps in the rectum and in the                            transverse colon, removed with a cold snare.                            Resected and retrieved.                           - Diverticulosis in the sigmoid colon and in the                            descending colon.                           -  Non-bleeding internal hemorrhoids.                           - The examination was otherwise normal. Recommendation:           - Patient has a contact number available for                            emergencies. The signs and symptoms of potential                            delayed complications were discussed with the                            patient. Return to normal activities tomorrow.                            Written discharge instructions were provided to the                            patient.                           - Resume previous diet.                           - Continue present medications.                           - Await pathology results.                           - Repeat colonoscopy in 3 - 5 years for                            surveillance based on pathology results. Mauri Pole, MD 02/24/2018 9:22:03 AM This report has been signed electronically.

## 2018-02-24 NOTE — Progress Notes (Signed)
Pt's states no medical or surgical changes since previsit or office visit. 

## 2018-02-24 NOTE — Patient Instructions (Signed)
YOU HAD AN ENDOSCOPIC PROCEDURE TODAY AT THE [facility]:   Refer to the procedure report that was given to you for any specific questions about what was found during the examination.  If the procedure report does not answer your questions, please call your gastroenterologist to clarify.  If you requested that your care partner not be given the details of your procedure findings, then the procedure report has been included in a sealed envelope for you to review at your convenience later.  YOU SHOULD EXPECT: Some feelings of bloating in the abdomen. Passage of more gas than usual.  Walking can help get rid of the air that was put into your GI tract during the procedure and reduce the bloating. If you had a lower endoscopy (such as a colonoscopy or flexible sigmoidoscopy) you may notice spotting of blood in your stool or on the toilet paper. If you underwent a bowel prep for your procedure, you may not have a normal bowel movement for a few days.  Please Note:  You might notice some irritation and congestion in your nose or some drainage.  This is from the oxygen used during your procedure.  There is no need for concern and it should clear up in a day or so.  SYMPTOMS TO REPORT IMMEDIATELY:   Following lower endoscopy (colonoscopy or flexible sigmoidoscopy):  Excessive amounts of blood in the stool  Significant tenderness or worsening of abdominal pains  Swelling of the abdomen that is new, acute  Fever of 100F or higher  For urgent or emergent issues, a gastroenterologist can be reached at any hour by calling [phone].   DIET:  We do recommend a small meal at first, but then you may proceed to your regular diet.  Drink plenty of fluids but you should avoid alcoholic beverages for 24 hours.  MEDICATIONS: Continue present medications.  Please see handouts given to you by your recovery nurse.  ACTIVITY:  You should plan to take it easy for the rest of today and you should NOT  DRIVE or use heavy machinery until tomorrow (because of the sedation medicines used during the test).    FOLLOW UP: Our staff will call the number listed on your records the next business day following your procedure to check on you and address any questions or concerns that you may have regarding the information given to you following your procedure. If we do not reach you, we will leave a message.  However, if you are feeling well and you are not experiencing any problems, there is no need to return our call.  We will assume that you have returned to your regular daily activities without incident.  If any biopsies were taken you will be contacted by phone or by letter within the next 1-3 weeks.  Please call us at [phone] if you have not heard about the biopsies in 3 weeks.   Thank you for allowing us to provide for your healthcare needs today.   SIGNATURES/CONFIDENTIALITY: You and/or your care partner have signed paperwork which will be entered into your electronic medical record.  These signatures attest to the fact that that the information above on your After Visit Summary has been reviewed and is understood.  Full responsibility of the confidentiality of this discharge information lies with you and/or your care-partner. 

## 2018-02-24 NOTE — Progress Notes (Signed)
Report given to PACU, vss 

## 2018-02-25 ENCOUNTER — Telehealth: Payer: Self-pay

## 2018-02-25 NOTE — Telephone Encounter (Signed)
  Follow up Call-  Call back number 02/24/2018  Post procedure Call Back phone  # [phone]  Permission to leave phone message Yes  Some recent data might be hidden     Patient questions:  Do you have a fever, pain , or abdominal swelling? No. Pain Score  0 *  Have you tolerated food without any problems? Yes.    Have you been able to return to your normal activities? Yes.    Do you have any questions about your discharge instructions: Diet   No. Medications  No. Follow up visit  No.  Do you have questions or concerns about your Care? No.  Actions: * If pain score is 4 or above: No action needed, pain <4.

## 2018-03-05 ENCOUNTER — Encounter: Payer: Self-pay | Admitting: Gastroenterology

## 2018-03-10 ENCOUNTER — Telehealth: Payer: Self-pay | Admitting: Obstetrics and Gynecology

## 2018-03-10 DIAGNOSIS — R7989 Other specified abnormal findings of blood chemistry: Secondary | ICD-10-CM

## 2018-03-10 DIAGNOSIS — R740 Nonspecific elevation of levels of transaminase and lactic acid dehydrogenase [LDH]: Secondary | ICD-10-CM

## 2018-03-10 DIAGNOSIS — R7303 Prediabetes: Secondary | ICD-10-CM

## 2018-03-10 DIAGNOSIS — R7401 Elevation of levels of liver transaminase levels: Secondary | ICD-10-CM

## 2018-03-10 DIAGNOSIS — E781 Pure hyperglyceridemia: Secondary | ICD-10-CM

## 2018-03-10 NOTE — Telephone Encounter (Signed)
I would reorder a liver panel, a HgbA1c and fasting lipid panel (with her vit d). Her last labs showed an elevated ALT, pre-diabetes, and an elevated triglyceride and VLDL. The rest of her lab work was fine Pleases inform

## 2018-03-10 NOTE — Telephone Encounter (Signed)
Patient has labs scheduled Monday 03/16/18 and would like to know if she can get full lab work done at this time. She says she would like the same testing that she had in January.

## 2018-03-10 NOTE — Telephone Encounter (Signed)
Patient had CBC, CMP, Hemoglobin A1c, lipid panel, TSH, and Vitamin D at her appointment in January. Patient is returning for a Vitamin D recheck. Would like to know if she may have all other labs checked as well.

## 2018-03-11 ENCOUNTER — Other Ambulatory Visit: Payer: Self-pay

## 2018-03-11 NOTE — Telephone Encounter (Signed)
Spoke with patient. Advised of message as seen below from [place]. Patient verbalizes understanding. Aware she will need to be fasting for lab work. Encounter closed.

## 2018-03-11 NOTE — Telephone Encounter (Signed)
Left message to call [name] at [phone]. 

## 2018-03-16 ENCOUNTER — Other Ambulatory Visit (INDEPENDENT_AMBULATORY_CARE_PROVIDER_SITE_OTHER): Payer: Managed Care, Other (non HMO)

## 2018-03-16 DIAGNOSIS — R7989 Other specified abnormal findings of blood chemistry: Secondary | ICD-10-CM

## 2018-03-16 DIAGNOSIS — R7303 Prediabetes: Secondary | ICD-10-CM

## 2018-03-16 DIAGNOSIS — E781 Pure hyperglyceridemia: Secondary | ICD-10-CM

## 2018-03-16 DIAGNOSIS — R740 Nonspecific elevation of levels of transaminase and lactic acid dehydrogenase [LDH]: Secondary | ICD-10-CM

## 2018-03-16 DIAGNOSIS — R7401 Elevation of levels of liver transaminase levels: Secondary | ICD-10-CM

## 2018-03-17 ENCOUNTER — Telehealth: Payer: Self-pay | Admitting: Obstetrics and Gynecology

## 2018-03-17 DIAGNOSIS — R7401 Elevation of levels of liver transaminase levels: Secondary | ICD-10-CM

## 2018-03-17 DIAGNOSIS — R740 Nonspecific elevation of levels of transaminase and lactic acid dehydrogenase [LDH]: Principal | ICD-10-CM

## 2018-03-17 LAB — HEPATIC FUNCTION PANEL
ALT: 48 IU/L — ABNORMAL HIGH (ref 0–32)
AST: 33 IU/L (ref 0–40)
Albumin: 4.6 g/dL (ref 3.5–5.5)
Alkaline Phosphatase: 60 IU/L (ref 39–117)
Bilirubin Total: 0.5 mg/dL (ref 0.0–1.2)
Bilirubin, Direct: 0.12 mg/dL (ref 0.00–0.40)
Total Protein: 6.8 g/dL (ref 6.0–8.5)

## 2018-03-17 LAB — VITAMIN D 25 HYDROXY (VIT D DEFICIENCY, FRACTURES): Vit D, 25-Hydroxy: 32.2 ng/mL (ref 30.0–100.0)

## 2018-03-17 LAB — HEMOGLOBIN A1C
Est. average glucose Bld gHb Est-mCnc: 114 mg/dL
Hgb A1c MFr Bld: 5.6 % (ref 4.8–5.6)

## 2018-03-17 LAB — LIPID PANEL
Chol/HDL Ratio: 3.6 ratio (ref 0.0–4.4)
Cholesterol, Total: 155 mg/dL (ref 100–199)
HDL: 43 mg/dL (ref >39)
LDL Calculated: 86 mg/dL (ref 0–99)
Triglycerides: 131 mg/dL (ref 0–149)
VLDL Cholesterol Cal: 26 mg/dL (ref 5–40)

## 2018-03-17 NOTE — Telephone Encounter (Signed)
Patient called requesting results from lab work done yesterday.  °

## 2018-03-17 NOTE — Telephone Encounter (Signed)
Routing to [place] for review and advise of lab work drom 03/16/2018.

## 2018-03-18 NOTE — Telephone Encounter (Signed)
Please see result note 

## 2018-03-19 ENCOUNTER — Other Ambulatory Visit (INDEPENDENT_AMBULATORY_CARE_PROVIDER_SITE_OTHER): Payer: Managed Care, Other (non HMO)

## 2018-03-19 DIAGNOSIS — R740 Nonspecific elevation of levels of transaminase and lactic acid dehydrogenase [LDH]: Secondary | ICD-10-CM

## 2018-03-19 DIAGNOSIS — R7401 Elevation of levels of liver transaminase levels: Secondary | ICD-10-CM

## 2018-03-19 NOTE — Telephone Encounter (Signed)
Left message to call [place] at [phone].  Notes recorded by Salvadore Dom, MD on 03/18/2018 at 1:05 PM EDT Please inform the patient that her vit d is now normal. She should start taking 1,000 IU of vit d 3 daily (long term). Her lipid panel is now normal and her Hgb A1C is normal (great job!!). Her ALT is still mildly elevated. She can establish care with a primary, or we can further evaluate this for her.  If she wants evaluation here, she needs a HBsAg, antibody to HBsAg, antibody-HBC, antibody HCV, serum iron, TIBS and an ultrasound of her liver.

## 2018-03-19 NOTE — Telephone Encounter (Signed)
Spoke with patient. Results given. Patient verbalizes understanding. Would like to proceed with further evaluation with our office. Lab appointment scheduled for today at 11:30 am. Aware I will contact [facility] to schedule liver ultrasound and return call with appointment date and time. Patient is agreeable.  Spoke with Melissa at [place]. [place] for liver needs to be US abdomen limited RUQ. Appointment scheduled for first available on 03/31/2018 at 8:30 am at [facility]. Patient is agreeable to date and time. Placed in imaging hold.  Routing to provider for final review. Patient agreeable to disposition. Will close encounter.

## 2018-03-20 LAB — IRON AND TIBC
Iron Saturation: 18 % (ref 15–55)
Iron: 62 ug/dL (ref 27–159)
Total Iron Binding Capacity: 345 ug/dL (ref 250–450)
UIBC: 283 ug/dL (ref 131–425)

## 2018-03-20 LAB — HEPATITIS B SURFACE ANTIBODY,QUALITATIVE: Hep B Surface Ab, Qual: NONREACTIVE

## 2018-03-20 LAB — HEPATITIS B SURFACE ANTIGEN: Hepatitis B Surface Ag: NEGATIVE

## 2018-03-20 LAB — HEPATITIS B CORE ANTIBODY, IGM: Hep B C IgM: NEGATIVE

## 2018-03-20 LAB — HEPATITIS C ANTIBODY: Hep C Virus Ab: <0.1 s/co ratio (ref 0.0–0.9)

## 2018-03-23 ENCOUNTER — Other Ambulatory Visit: Payer: Managed Care, Other (non HMO)

## 2018-03-31 ENCOUNTER — Ambulatory Visit
Admission: RE | Admit: 2018-03-31 | Discharge: 2018-03-31 | Disposition: A | Discharge status: Home / Self Care | Payer: Managed Care, Other (non HMO) | Source: Ambulatory Visit | Attending: Obstetrics and Gynecology | Admitting: Obstetrics and Gynecology

## 2018-03-31 ENCOUNTER — Telehealth: Payer: Self-pay

## 2018-03-31 DIAGNOSIS — R7401 Elevation of levels of liver transaminase levels: Secondary | ICD-10-CM

## 2018-03-31 DIAGNOSIS — R740 Nonspecific elevation of levels of transaminase and lactic acid dehydrogenase [LDH]: Principal | ICD-10-CM

## 2018-03-31 NOTE — Telephone Encounter (Signed)
Spoke with patient. Advised of results and message as seen below from [place]. Patient verbalizes understanding. 6 month lab follow up scheduled for 10/01/2018 at 11:30 am. Patient is agreeable to date and time. Encounter close encounter.

## 2018-03-31 NOTE — Telephone Encounter (Signed)
-----   Message from Salvadore Dom, MD sent at 03/31/2018  2:28 PM EDT ----- Please let the patient know that her liver ultrasound is normal. Please set her up for a repeat liver panel in 6 months. If still elevated, I will send her out for further evaluation. Avoid ETOH.  Make sure she knows that she doesn't have immunity to hepatitis B. Not everyone needs immunization and that is outside of the scope of my practice. I just wanted her to know.

## 2018-10-01 ENCOUNTER — Encounter: Payer: Self-pay | Admitting: Obstetrics and Gynecology

## 2018-10-01 ENCOUNTER — Other Ambulatory Visit: Payer: Self-pay | Admitting: Obstetrics and Gynecology

## 2018-10-01 ENCOUNTER — Telehealth: Payer: Self-pay | Admitting: Obstetrics and Gynecology

## 2018-10-01 ENCOUNTER — Other Ambulatory Visit: Payer: Managed Care, Other (non HMO)

## 2018-10-01 DIAGNOSIS — R740 Nonspecific elevation of levels of transaminase and lactic acid dehydrogenase [LDH]: Principal | ICD-10-CM

## 2018-10-01 DIAGNOSIS — R7401 Elevation of levels of liver transaminase levels: Secondary | ICD-10-CM

## 2018-10-01 NOTE — Telephone Encounter (Signed)
Called patient regarding missed lab appointment. Mailbox is full.

## 2018-10-01 NOTE — Telephone Encounter (Signed)
Please send her a note and close the encounter.

## 2018-11-25 HISTORY — PX: WISDOM TOOTH EXTRACTION: SHX21

## 2018-12-22 ENCOUNTER — Ambulatory Visit: Payer: Managed Care, Other (non HMO) | Admitting: Family

## 2018-12-28 ENCOUNTER — Ambulatory Visit: Payer: Self-pay | Admitting: Obstetrics and Gynecology

## 2019-01-12 ENCOUNTER — Other Ambulatory Visit: Payer: Self-pay | Admitting: Obstetrics and Gynecology

## 2019-01-12 DIAGNOSIS — Z1231 Encounter for screening mammogram for malignant neoplasm of breast: Secondary | ICD-10-CM

## 2019-01-13 NOTE — Progress Notes (Signed)
53 y.o. G80P2012 Divorced Caucasian female here for annual exam.  She had a LEEP for HSIL last year. Pathology with CIN II, negative margins and ECC.  Not sexually active in the last year.   She is occasionally waking up at night, no night sweats. Feels rested during the day.   She had shingles last year.   She had a mildly elevated LFT last year. She had a negative evaluation. She has an appointment to see a new primary in April.   Period Cycle (Days): 28 Period Duration (Days): 4 days Period Pattern: Regular Menstrual Flow: Moderate Menstrual Control: Thin pad Menstrual Control Change Freq (Hours): changes pad every 4 hours Dysmenorrhea: None  Patient's last menstrual period was 01/13/2019 (exact date).          Sexually active: No.  The current method of family planning is none.    Exercising: Yes.    walking Smoker:  no  Health Maintenance: Pap:  12/15/2017 ASCUS pos HPV, 12-17-16 ASCUS + HR HPV  History of abnormal Pap:  Yes 2019 ASCUS pos HPV, 12/26/2017 colposcopy CIN II, 01/13/18 HSIL negative margins and negative ECC, 2018 ASCUS + HR HPV- did not follow up. H/O cryosurgery at 40.  MMG:  01/27/2018 Birads 1 negative Colonoscopy:  02/24/2018, polyps, f/u in 5 year. BMD:   Never TDaP:  2012 Gardasil: N/A   reports that she has never smoked. She has never used smokeless tobacco. She reports current alcohol use of about 4.0 - 5.0 standard drinks of alcohol per week. She reports that she does not use drugs. She is Therapist, art at [employer]. 60 kids, 7 year old daughter, lives locally. Son is 12, senior in [employer]. He is going to [place] and will work for a while after graduation for a while.  She downsized this year, now in a condo. It's great.   Past Medical History:  Diagnosis Date  . Allergy    seasonal    Past Surgical History:  Procedure Laterality Date  . CERVICAL BIOPSY  W/ LOOP ELECTRODE EXCISION  12/2017  . CESAREAN SECTION    . DILATION AND CURETTAGE OF UTERUS       Current Outpatient Medications  Medication Sig Dispense Refill  . ALPRAZolam (XANAX) 0.25 MG tablet TAKE ONE TABLET BY MOUTH AT BEDTIME AS NEEDED FOR ANXIETY (Patient not taking: Reported on 01/19/2019) 30 tablet 1  . Vitamin D, Ergocalciferol, (DRISDOL) 50000 units CAPS capsule Take 1 capsule (50,000 Units total) by mouth every 7 (seven) days. (Patient not taking: Reported on 01/19/2019) 12 capsule 0  . Vitamin D, Ergocalciferol, 2000 units CAPS Take by mouth.     Current Facility-Administered Medications  Medication Dose Route Frequency Provider Last Rate Last Dose  . 0.9 %  sodium chloride infusion  500 mL Intravenous Once Nandigam, Venia Minks, MD        Family History  Problem Relation Age of Onset  . Breast cancer Mother        in her early 62's, now in her 64's  . Prostate cancer Father   . Colon cancer Maternal Grandmother 52  . Breast cancer Cousin        in 76's  . Esophageal cancer Maternal Grandfather   . Rectal cancer Neg Hx   . Stomach cancer Neg Hx   Paternal cousin with breast cancer in her 33's.   Review of Systems  Constitutional: Negative.   HENT: Negative.   Eyes: Negative.   Respiratory: Negative.   Cardiovascular:  Negative.   Gastrointestinal: Negative.   Endocrine: Negative.   Genitourinary: Negative.   Musculoskeletal: Negative.   Skin: Negative.   Allergic/Immunologic: Negative.   Neurological: Negative.   Hematological: Negative.   Psychiatric/Behavioral: Negative.     Exam:   BP 132/80 (BP Location: Right Arm, Patient Position: Sitting, Cuff Size: Large)   Pulse 68   Ht 5\' 6"  (1.676 m)   Wt 247 lb (112 kg)   LMP 01/13/2019 (Exact Date)   BMI 39.87 kg/m   Weight change: @WEIGHTCHANGE @ Height:   Height: 5\' 6"  (167.6 cm)  Ht Readings from Last 3 Encounters:  01/19/19 5\' 6"  (1.676 m)  02/24/18 5\' 6"  (1.676 m)  12/15/17 5\' 6"  (1.676 m)    General appearance: alert, cooperative and appears stated age Head: Normocephalic, without obvious  abnormality, atraumatic Neck: no adenopathy, supple, symmetrical, trachea midline and thyroid normal to inspection and palpation Lungs: clear to auscultation bilaterally Cardiovascular: regular rate and rhythm Breasts: normal appearance, no masses or tenderness Abdomen: soft, non-tender; non distended,  no masses,  no organomegaly Extremities: extremities normal, atraumatic, no cyanosis or edema Skin: Skin color, texture, turgor normal. No rashes or lesions Lymph nodes: Cervical, supraclavicular, and axillary nodes normal. No abnormal inguinal nodes palpated Neurologic: Grossly normal   Pelvic: External genitalia:  no lesions              Urethra:  normal appearing urethra with no masses, tenderness or lesions              Bartholins and Skenes: normal                 Vagina: normal appearing vagina with normal color and discharge, no lesions              Cervix: no lesions and evidence of leep               Bimanual Exam:  Uterus:  normal size, contour, position, consistency, mobility, non-tender              Adnexa: no mass, fullness, tenderness               Rectovaginal: Confirms               Anus:  normal sphincter tone, no lesions  Chaperone was present for exam.  A:  Well Woman with normal exam  FH of breast cancer in her Mom (51's) and Pcousin in her 40's.  H/O LEEP last year  H/O elevated LFT, negative evaluation  P:   Pap with hpv  3D mammogram scheduled  Discussed breast self exam  Discussed calcium and vit D intake  Will have labs with primary in 4/20  Discussed breast self exam  Discussed calcium and vit D intake  Discussed option of genetic referral, she will let me know if she wants to proceed

## 2019-01-15 ENCOUNTER — Ambulatory Visit: Payer: Managed Care, Other (non HMO) | Admitting: Obstetrics and Gynecology

## 2019-01-19 ENCOUNTER — Ambulatory Visit: Payer: Managed Care, Other (non HMO) | Admitting: Obstetrics and Gynecology

## 2019-01-19 ENCOUNTER — Other Ambulatory Visit: Payer: Self-pay

## 2019-01-19 ENCOUNTER — Encounter: Payer: Self-pay | Admitting: Obstetrics and Gynecology

## 2019-01-19 ENCOUNTER — Other Ambulatory Visit (HOSPITAL_COMMUNITY)
Admission: RE | Admit: 2019-01-19 | Discharge: 2019-01-19 | Disposition: A | Discharge status: Home / Self Care | Payer: Managed Care, Other (non HMO) | Source: Ambulatory Visit | Attending: Obstetrics and Gynecology | Admitting: Obstetrics and Gynecology

## 2019-01-19 VITALS — BP 132/80 | HR 68 | Ht 66.0 in | Wt 247.0 lb

## 2019-01-19 DIAGNOSIS — Z01419 Encounter for gynecological examination (general) (routine) without abnormal findings: Secondary | ICD-10-CM | POA: Diagnosis not present

## 2019-01-19 DIAGNOSIS — Z124 Encounter for screening for malignant neoplasm of cervix: Secondary | ICD-10-CM

## 2019-01-19 NOTE — Patient Instructions (Signed)
EXERCISE AND DIET:  We recommended that you start or continue a regular exercise program for good health. Regular exercise means any activity that makes your heart beat faster and makes you sweat.  We recommend exercising at least 30 minutes per day at least 3 days a week, preferably 4 or 5.  We also recommend a diet low in fat and sugar.  Inactivity, poor dietary choices and obesity can cause diabetes, heart attack, stroke, and kidney damage, among others.    ALCOHOL AND SMOKING:  Women should limit their alcohol intake to no more than 7 drinks/beers/glasses of wine (combined, not each!) per week. Moderation of alcohol intake to this level decreases your risk of breast cancer and liver damage. And of course, no recreational drugs are part of a healthy lifestyle.  And absolutely no smoking or even second hand smoke. Most people know smoking can cause heart and lung diseases, but did you know it also contributes to weakening of your bones? Aging of your skin?  Yellowing of your teeth and nails?  CALCIUM AND VITAMIN D:  Adequate intake of calcium and Vitamin D are recommended.  The recommendations for exact amounts of these supplements seem to change often, but generally speaking 1,000 mg of calcium (between diet and supplement) and 800 units of Vitamin D per day seems prudent. Certain women may benefit from higher intake of Vitamin D.  If you are among these women, your doctor will have told you during your visit.    PAP SMEARS:  Pap smears, to check for cervical cancer or precancers,  have traditionally been done yearly, although recent scientific advances have shown that most women can have pap smears less often.  However, every woman still should have a physical exam from her gynecologist every year. It will include a breast check, inspection of the vulva and vagina to check for abnormal growths or skin changes, a visual exam of the cervix, and then an exam to evaluate the size and shape of the uterus and  ovaries.  And after 52 years of age, a rectal exam is indicated to check for rectal cancers. We will also provide age appropriate advice regarding health maintenance, like when you should have certain vaccines, screening for sexually transmitted diseases, bone density testing, colonoscopy, mammograms, etc.   MAMMOGRAMS:  All women over 40 years old should have a yearly mammogram. Many facilities now offer a "3D" mammogram, which may cost around $50 extra out of pocket. If possible,  we recommend you accept the option to have the 3D mammogram performed.  It both reduces the number of women who will be called back for extra views which then turn out to be normal, and it is better than the routine mammogram at detecting truly abnormal areas.    COLON CANCER SCREENING: Now recommend starting at age 45. At this time colonoscopy is not covered for routine screening until 50. There are take home tests that can be done between 45-49.   COLONOSCOPY:  Colonoscopy to screen for colon cancer is recommended for all women at age 50.  We know, you hate the idea of the prep.  We agree, BUT, having colon cancer and not knowing it is worse!!  Colon cancer so often starts as a polyp that can be seen and removed at colonscopy, which can quite literally save your life!  And if your first colonoscopy is normal and you have no family history of colon cancer, most women don't have to have it again for   10 years.  Once every ten years, you can do something that may end up saving your life, right?  We will be happy to help you get it scheduled when you are ready.  Be sure to check your insurance coverage so you understand how much it will cost.  It may be covered as a preventative service at no cost, but you should check your particular policy.      Breast Self-Awareness Breast self-awareness means being familiar with how your breasts look and feel. It involves checking your breasts regularly and reporting any changes to your  health care provider. Practicing breast self-awareness is important. A change in your breasts can be a sign of a serious medical problem. Being familiar with how your breasts look and feel allows you to find any problems early, when treatment is more likely to be successful. All women should practice breast self-awareness, including women who have had breast implants. How to do a breast self-exam One way to learn what is normal for your breasts and whether your breasts are changing is to do a breast self-exam. To do a breast self-exam: Look for Changes  1. Remove all the clothing above your waist. 2. Stand in front of a mirror in a room with good lighting. 3. Put your hands on your hips. 4. Push your hands firmly downward. 5. Compare your breasts in the mirror. Look for differences between them (asymmetry), such as: ? Differences in shape. ? Differences in size. ? Puckers, dips, and bumps in one breast and not the other. 6. Look at each breast for changes in your skin, such as: ? Redness. ? Scaly areas. 7. Look for changes in your nipples, such as: ? Discharge. ? Bleeding. ? Dimpling. ? Redness. ? A change in position. Feel for Changes Carefully feel your breasts for lumps and changes. It is best to do this while lying on your back on the floor and again while sitting or standing in the shower or tub with soapy water on your skin. Feel each breast in the following way:  Place the arm on the side of the breast you are examining above your head.  Feel your breast with the other hand.  Start in the nipple area and make  inch (2 cm) overlapping circles to feel your breast. Use the pads of your three middle fingers to do this. Apply light pressure, then medium pressure, then firm pressure. The light pressure will allow you to feel the tissue closest to the skin. The medium pressure will allow you to feel the tissue that is a Mirabile deeper. The firm pressure will allow you to feel the tissue  close to the ribs.  Continue the overlapping circles, moving downward over the breast until you feel your ribs below your breast.  Move one finger-width toward the center of the body. Continue to use the  inch (2 cm) overlapping circles to feel your breast as you move slowly up toward your collarbone.  Continue the up and down exam using all three pressures until you reach your armpit.  Write Down What You Find  Write down what is normal for each breast and any changes that you find. Keep a written record with breast changes or normal findings for each breast. By writing this information down, you do not need to depend only on memory for size, tenderness, or location. Write down where you are in your menstrual cycle, if you are still menstruating. If you are having trouble noticing differences   in your breasts, do not get discouraged. With time you will become more familiar with the variations in your breasts and more comfortable with the exam. How often should I examine my breasts? Examine your breasts every month. If you are breastfeeding, the best time to examine your breasts is after a feeding or after using a breast pump. If you menstruate, the best time to examine your breasts is 5-7 days after your period is over. During your period, your breasts are lumpier, and it may be more difficult to notice changes. When should I see my health care provider? See your health care provider if you notice:  A change in shape or size of your breasts or nipples.  A change in the skin of your breast or nipples, such as a reddened or scaly area.  Unusual discharge from your nipples.  A lump or thick area that was not there before.  Pain in your breasts.  Anything that concerns you.  

## 2019-01-21 LAB — CYTOLOGY - PAP
Diagnosis: NEGATIVE
HPV: NOT DETECTED

## 2019-01-22 ENCOUNTER — Telehealth: Payer: Self-pay | Admitting: *Deleted

## 2019-01-22 NOTE — Telephone Encounter (Signed)
-----   Message from Lindsay Dom, MD sent at 01/21/2019  5:40 PM EST ----- Please inform the patient that her pap and hpv were negative!!! This is her first pap s/p leep. She needs another pap in one year.

## 2019-01-22 NOTE — Telephone Encounter (Signed)
Attempted to reach patient to review results. Mailbox full, unable to leave a message at this time.

## 2019-01-27 NOTE — Telephone Encounter (Signed)
Attempted to reach patient to review results. Voicemail full- unable to leave a message.

## 2019-02-01 ENCOUNTER — Encounter: Payer: Self-pay | Admitting: *Deleted

## 2019-02-01 NOTE — Telephone Encounter (Signed)
Please send her a letter

## 2019-02-01 NOTE — Telephone Encounter (Signed)
Dr. Talbert Nan- I have attempted to reach this patient x2 to update about pap results. Patient's voicemail full and has not returned call. Patient is not active on MyChart. Please advise.   Routing to provider for review.

## 2019-02-01 NOTE — Telephone Encounter (Signed)
Letter to Dr. Talbert Nan to review and advise.

## 2019-02-02 NOTE — Telephone Encounter (Signed)
Letter sent to patients home address on file

## 2019-02-02 NOTE — Telephone Encounter (Signed)
Letter revised and sent to [place] for review.

## 2019-02-17 ENCOUNTER — Other Ambulatory Visit: Payer: Self-pay | Admitting: Women's Health

## 2019-02-17 DIAGNOSIS — F4322 Adjustment disorder with anxiety: Secondary | ICD-10-CM

## 2019-02-22 ENCOUNTER — Inpatient Hospital Stay: Admission: RE | Admit: 2019-02-22 | Payer: Managed Care, Other (non HMO) | Source: Ambulatory Visit

## 2019-03-08 ENCOUNTER — Ambulatory Visit: Payer: Managed Care, Other (non HMO) | Admitting: Family

## 2019-03-12 ENCOUNTER — Encounter: Payer: Self-pay | Admitting: Family

## 2019-03-12 ENCOUNTER — Ambulatory Visit (INDEPENDENT_AMBULATORY_CARE_PROVIDER_SITE_OTHER): Payer: Managed Care, Other (non HMO) | Admitting: Family

## 2019-03-12 DIAGNOSIS — R5383 Other fatigue: Secondary | ICD-10-CM | POA: Diagnosis not present

## 2019-03-12 DIAGNOSIS — E559 Vitamin D deficiency, unspecified: Secondary | ICD-10-CM

## 2019-03-12 DIAGNOSIS — Z13220 Encounter for screening for lipoid disorders: Secondary | ICD-10-CM | POA: Diagnosis not present

## 2019-03-12 DIAGNOSIS — F4322 Adjustment disorder with anxiety: Secondary | ICD-10-CM

## 2019-03-12 DIAGNOSIS — J309 Allergic rhinitis, unspecified: Secondary | ICD-10-CM

## 2019-03-12 MED ORDER — ALPRAZOLAM 0.25 MG PO TABS: 0.2500 mg | ORAL_TABLET | Freq: Every evening | ORAL | 0 refills | Status: DC | PRN

## 2019-03-12 MED ORDER — FLUTICASONE PROPIONATE 50 MCG/ACT NA SUSP: 2.0000 | Freq: Every day | NASAL | 6 refills | Status: DC

## 2019-03-12 NOTE — Progress Notes (Signed)
Lindsay Hampton is a 53 y.o. female with the following history as recorded in EpicCare:  There are no active problems to display for this patient.   Current Outpatient Medications  Medication Sig Dispense Refill  . ALPRAZolam (XANAX) 0.25 MG tablet Take 1 tablet (0.25 mg total) by mouth at bedtime as needed for anxiety. 30 tablet 0  . fluticasone (FLONASE) 50 MCG/ACT nasal spray Place 2 sprays into both nostrils daily. 16 g 6  . Vitamin D, Ergocalciferol, 2000 units CAPS Take by mouth.     No current facility-administered medications for this visit.     Allergies: Patient has no known allergies.  Past Medical History:  Diagnosis Date  . Allergy    seasonal    Past Surgical History:  Procedure Laterality Date  . CERVICAL BIOPSY  W/ LOOP ELECTRODE EXCISION  12/2017  . CESAREAN SECTION    . DILATION AND CURETTAGE OF UTERUS      Family History  Problem Relation Age of Onset  . Breast cancer Mother        in her early 39's, now in her 42's  . Prostate cancer Father   . Colon cancer Maternal Grandmother 12  . Breast cancer Cousin        in 28's  . Esophageal cancer Maternal Grandfather   . Rectal cancer Neg Hx   . Stomach cancer Neg Hx     Social History   Tobacco Use  . Smoking status: Never Smoker  . Smokeless tobacco: Never Used  Substance Use Topics  . Alcohol use: Yes    Alcohol/week: 4.0 - 5.0 standard drinks    Types: 4 - 5 Standard drinks or equivalent per week    Subjective:   I connected with Ulice Bold on 03/12/19 at  9:00 AM EDT by a video enabled telemedicine application and verified that I am speaking with the correct person using two identifiers.   I discussed the limitations of evaluation and management by telemedicine and the availability of in person appointments. The patient expressed understanding and agreed to proceed.   Patient is a new patient today. She is in baseline state of health; has majority of her healthcare needs managed by GYN but  was told she needed to have PCP as well; had exam with GYN 2 months ago- is up to date on pap smear, colonoscopy and mammogram is scheduled for later this summer. 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  General: Well developed, well nourished, in no acute distress  Head: Normocephalic and atraumatic  Lungs: Respirations unlabored;  Neurologic: Alert and oriented; speech intact; face symmetrical; moves all extremities well;   Assessment:  1. Other fatigue   2. Adjustment disorder with anxious mood   3. Lipid screening   4. Vitamin D deficiency   5. Allergic rhinitis, unspecified seasonality, unspecified trigger     Plan:  Patient is up to date on preventive care needs- she will let me know about the status of Tdap and Hepatitis B vaccine; she will plan for shingles vaccine in about 1 year- can get through  her employer free of charge; Rx for Xanax refilled- PDMP evaluated and reviewed; Trial of Flonase with Claritin- refill updated; She will plan to come to the office for fasting labs in about 2 months, sooner prn.   30+ minutes spent with patient today reviewing her health concerns and preventive care needs; counseling regarding her personal safety and situational anxiety related to her job;   No follow-ups on file.  Orders Placed This Encounter  Procedures  . CBC w/Diff    Standing Status:   Future    Standing Expiration Date:   03/11/2020  . Comp Met (CMET)    Standing Status:   Future    Standing Expiration Date:   03/11/2020  . Lipid panel    Standing Status:   Future    Standing Expiration Date:    03/11/2020  . TSH    Standing Status:   Future    Standing Expiration Date:   03/11/2020  . Vitamin D (25 hydroxy)    Standing Status:   Future    Standing Expiration Date:   03/11/2020    Requested Prescriptions   Signed Prescriptions Disp Refills  . fluticasone (FLONASE) 50 MCG/ACT nasal spray 16 g 6    Sig: Place 2 sprays into both nostrils daily.  Marland Kitchen ALPRAZolam (XANAX) 0.25 MG tablet 30 tablet 0    Sig: Take 1 tablet (0.25 mg total) by mouth at bedtime as needed for anxiety.

## 2019-05-04 ENCOUNTER — Telehealth: Payer: Self-pay | Admitting: *Deleted

## 2019-05-04 ENCOUNTER — Other Ambulatory Visit: Payer: Managed Care, Other (non HMO)

## 2019-05-04 DIAGNOSIS — Z20828 Contact with and (suspected) exposure to other viral communicable diseases: Secondary | ICD-10-CM

## 2019-05-04 DIAGNOSIS — Z20822 Contact with and (suspected) exposure to covid-19: Secondary | ICD-10-CM

## 2019-05-04 LAB — SAR COV2 SEROLOGY (COVID19)AB(IGG),IA: SARS CoV2 AB IGG: NEGATIVE

## 2019-05-04 NOTE — Telephone Encounter (Signed)
Spoke with patient and info given 

## 2019-05-04 NOTE — Telephone Encounter (Signed)
Pt called requesting COVID antibody testing; she says that she and Jodi Mourning previously discussed this at her appointment; the pt says that people have tested positive where she works; she also states that in March she had cough, and shortness of breath; the pt can be contacted at [phone], and a message can be left on the voicemail; will route to office for notification.

## 2019-05-04 NOTE — Addendum Note (Signed)
Addended by: Sherlene Shams on: 05/04/2019 01:03 PM   Modules accepted: Orders

## 2019-05-04 NOTE — Telephone Encounter (Signed)
It is fine to come get antibody testing; has to wear a mask and come to the lab at her convenience. Order in place.

## 2019-06-24 ENCOUNTER — Telehealth: Payer: Self-pay

## 2019-06-24 NOTE — Telephone Encounter (Signed)
Copied from [place] [id number]. Topic: General - Other >> Jun 24, 2019  3:17 PM Keene Breath wrote: Reason for CRM: Patient called to ask if the doctor would prescribe an eye allergy drop (Pataday), which patient said she has used before.  Please advise and call and let patient know if this is possible.  CB# [phone]

## 2019-06-25 MED ORDER — OLOPATADINE HCL 0.1 % OP SOLN: 1.0000 [drp] | Freq: Two times a day (BID) | OPHTHALMIC | 3 refills | Status: DC

## 2019-06-25 NOTE — Addendum Note (Signed)
Addended by: Sherlene Shams on: 06/25/2019 09:08 AM   Modules accepted: Orders

## 2019-06-25 NOTE — Telephone Encounter (Signed)
Called and left message for patient regarding script being sent in.

## 2019-06-25 NOTE — Telephone Encounter (Signed)
Will send in Pataday as requested;

## 2019-07-06 ENCOUNTER — Other Ambulatory Visit: Payer: Self-pay

## 2019-07-06 ENCOUNTER — Ambulatory Visit
Admission: RE | Admit: 2019-07-06 | Discharge: 2019-07-06 | Disposition: A | Discharge status: Home / Self Care | Payer: Managed Care, Other (non HMO) | Source: Ambulatory Visit | Attending: Obstetrics and Gynecology | Admitting: Obstetrics and Gynecology

## 2019-07-06 DIAGNOSIS — Z1231 Encounter for screening mammogram for malignant neoplasm of breast: Secondary | ICD-10-CM

## 2019-07-06 IMAGING — MG DIGITAL SCREENING BILATERAL MAMMOGRAM WITH TOMO AND CAD
8 series · 8 of 24 positions shown · non-contrast
Comparison: Previous exam(s).

CLINICAL DATA: Screening.

EXAM:
DIGITAL SCREENING BILATERAL MAMMOGRAM WITH TOMO AND CAD

[R MLO synth-2D]
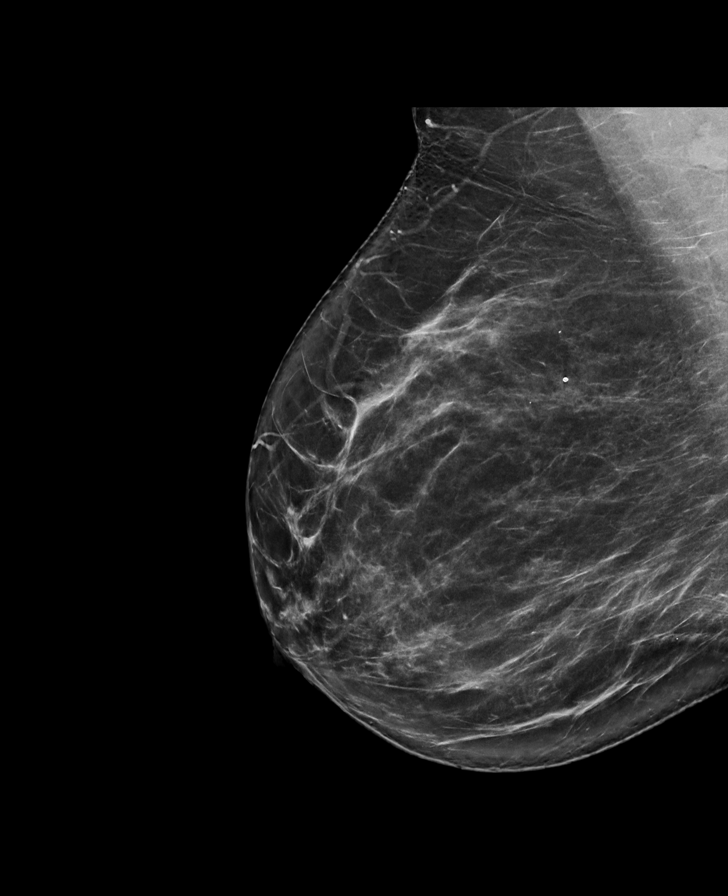

[L MLO synth-2D]
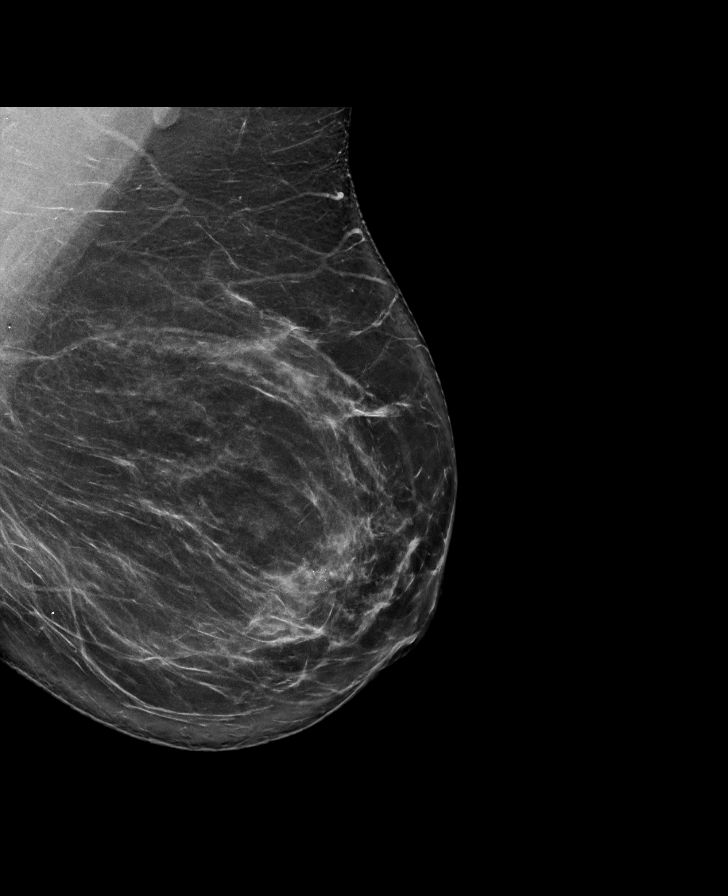

[R CC synth-2D]
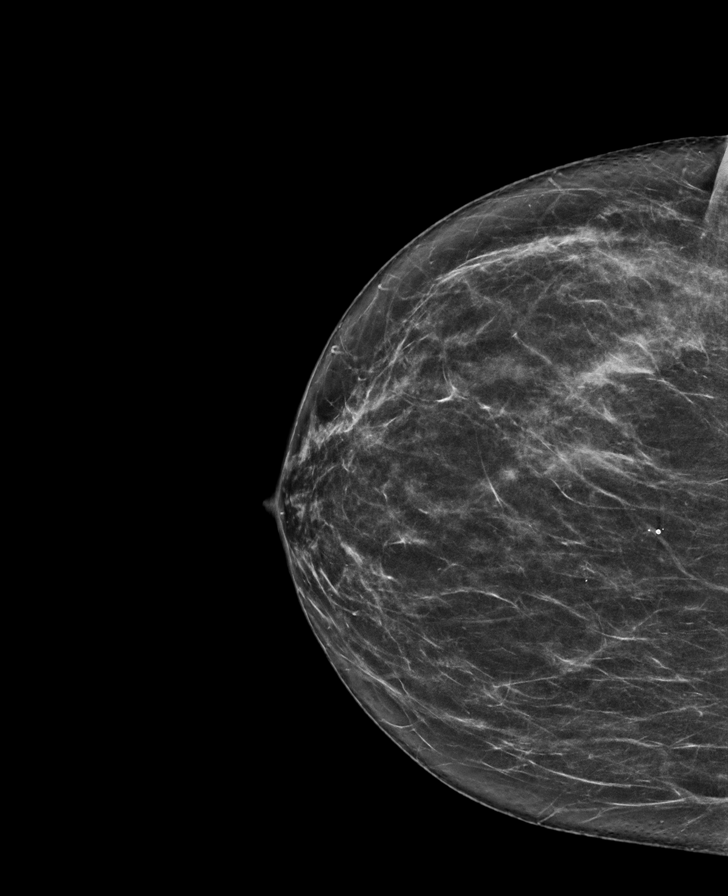

[L CC synth-2D]
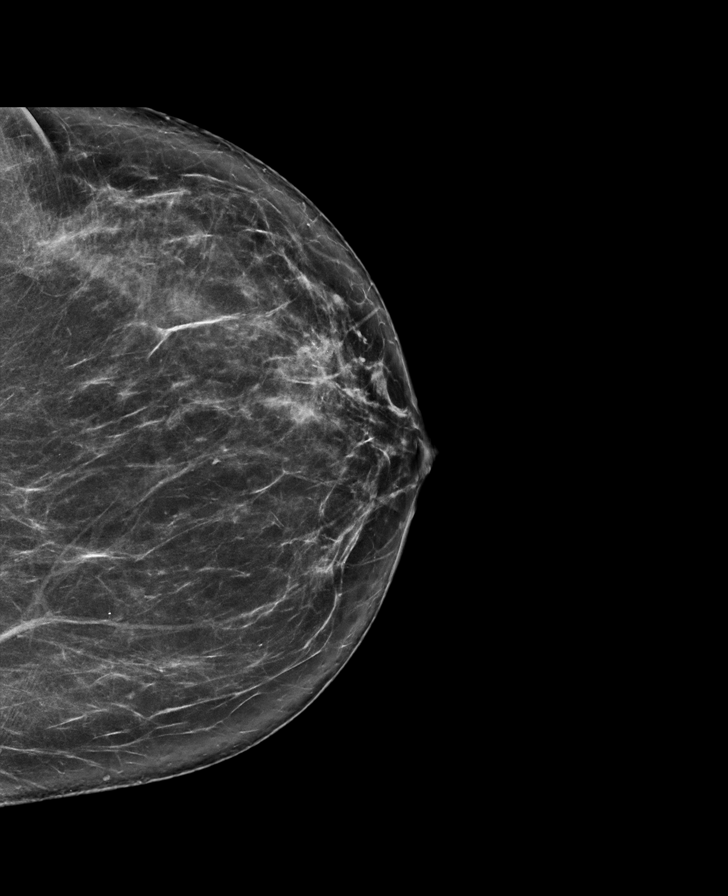

[R MLO tomo · tomo slice 45/89.0]
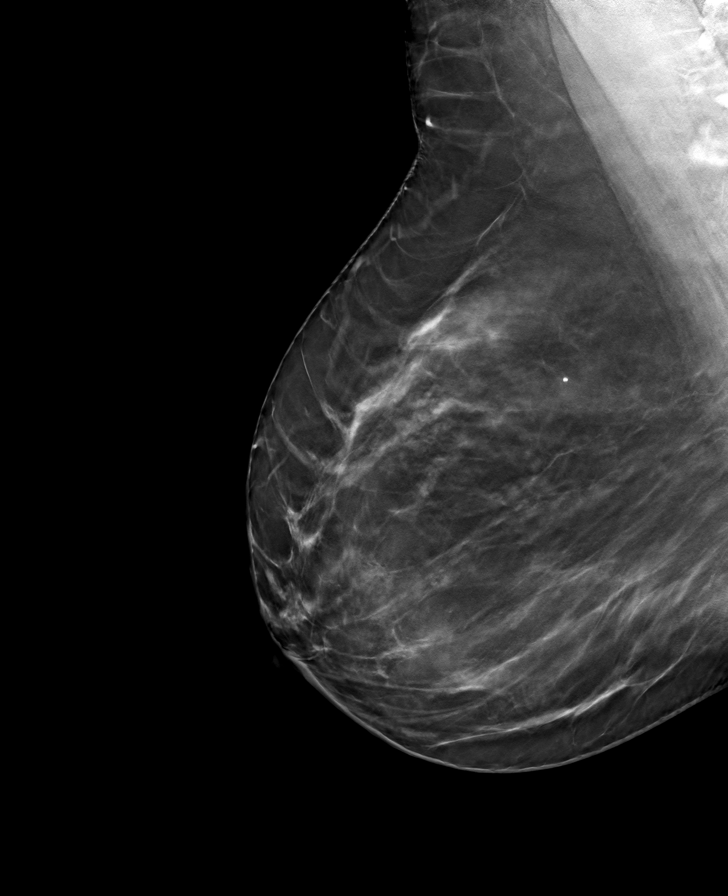

[L MLO tomo · tomo slice 47/92.0]
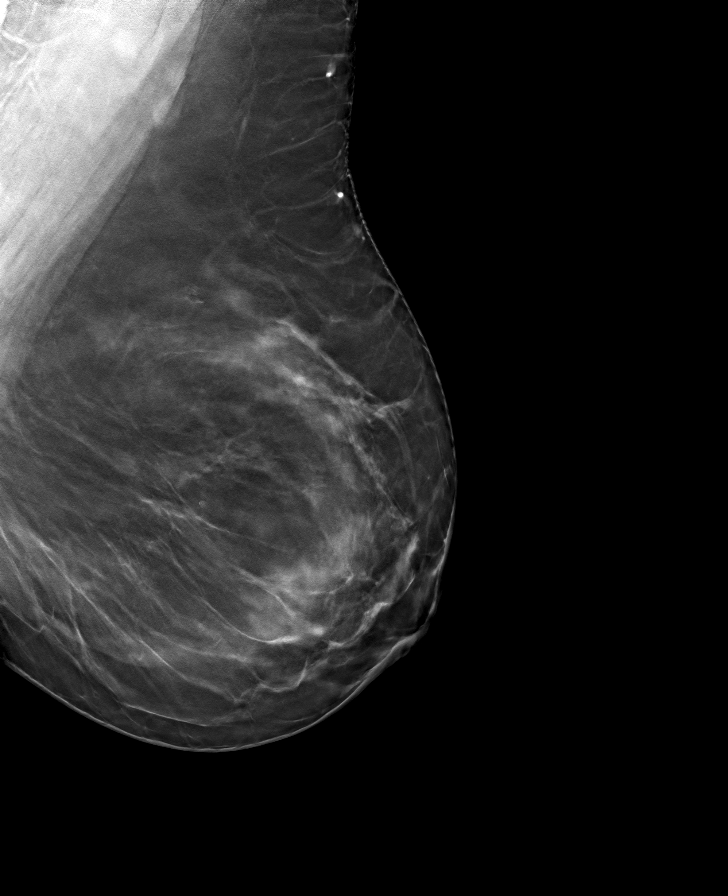

[R CC tomo · tomo slice 36/71.0]
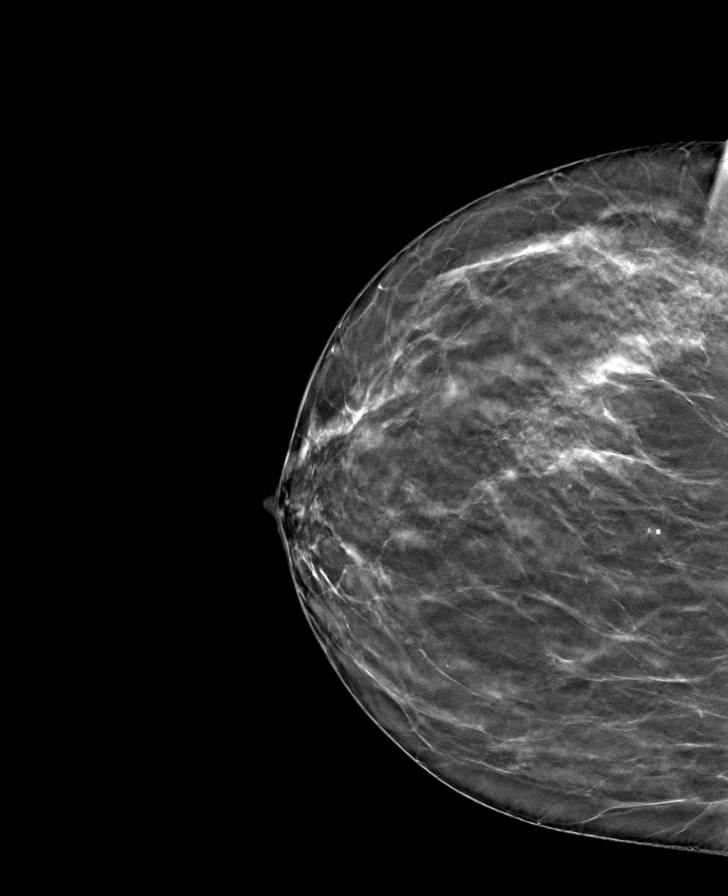

[L CC tomo · tomo slice 39/78.0]
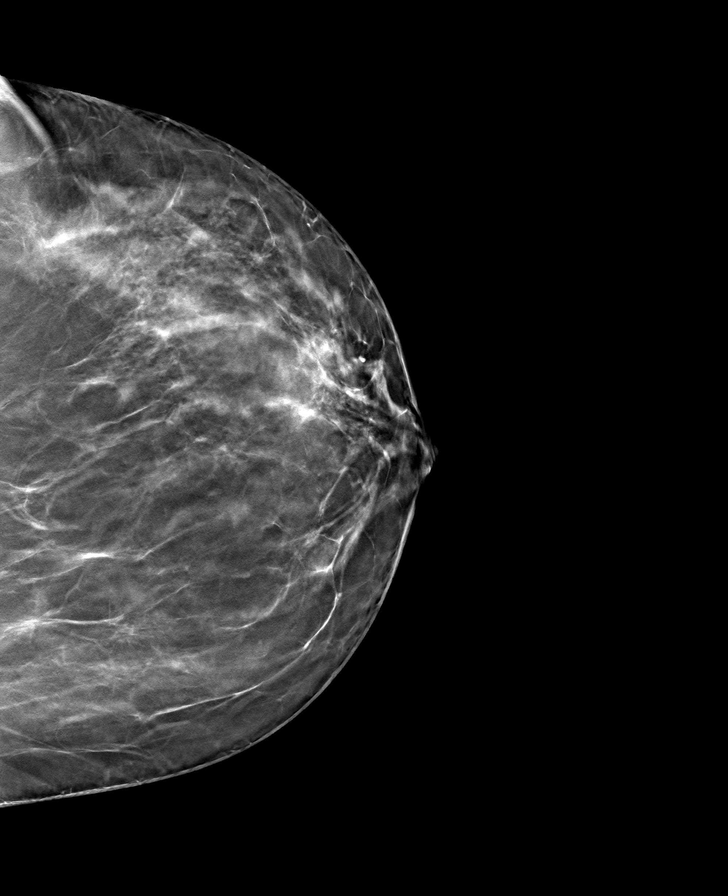

[8 of 24 positions shown; findings below may reference images not displayed]

ACR Breast Density Category b: There are scattered areas of
fibroglandular density.
FINDINGS: There are no findings suspicious for malignancy. Images were
processed with CAD.
IMPRESSION: No mammographic evidence of malignancy. A result letter of this
screening mammogram will be mailed directly to the patient.

RECOMMENDATION:
Screening mammogram in one year. (Code:[TQ])

BI-RADS CATEGORY  1: Negative.

## 2019-08-18 ENCOUNTER — Encounter: Payer: Self-pay | Admitting: Gynecology

## 2019-11-16 ENCOUNTER — Ambulatory Visit (INDEPENDENT_AMBULATORY_CARE_PROVIDER_SITE_OTHER): Payer: Managed Care, Other (non HMO) | Admitting: Family

## 2019-11-16 ENCOUNTER — Encounter: Payer: Self-pay | Admitting: Family

## 2019-11-16 DIAGNOSIS — R519 Headache, unspecified: Secondary | ICD-10-CM | POA: Diagnosis not present

## 2019-11-16 MED ORDER — CEFDINIR 300 MG PO CAPS: 300.0000 mg | ORAL_CAPSULE | Freq: Two times a day (BID) | ORAL | 0 refills | Status: DC

## 2019-11-16 NOTE — Progress Notes (Signed)
Lindsay Hampton is a 53 y.o. female with the following history as recorded in EpicCare:  There are no problems to display for this patient.   Current Outpatient Medications  Medication Sig Dispense Refill  . ALPRAZolam (XANAX) 0.25 MG tablet Take 1 tablet (0.25 mg total) by mouth at bedtime as needed for anxiety. 30 tablet 0  . fluticasone (FLONASE) 50 MCG/ACT nasal spray Place 2 sprays into both nostrils daily. 16 g 6  . olopatadine (PATADAY) 0.1 % ophthalmic solution Place 1 drop into both eyes 2 (two) times daily. 5 mL 3  . Vitamin D, Ergocalciferol, 2000 units CAPS Take by mouth.     No current facility-administered medications for this visit.    Allergies: Patient has no known allergies.  Past Medical History:  Diagnosis Date  . Allergy    seasonal    Past Surgical History:  Procedure Laterality Date  . CERVICAL BIOPSY  W/ LOOP ELECTRODE EXCISION  12/2017  . CESAREAN SECTION    . DILATION AND CURETTAGE OF UTERUS      Family History  Problem Relation Age of Onset  . Breast cancer Mother        in her early 32's, now in her 37's  . Prostate cancer Father   . Colon cancer Maternal Grandmother 11  . Breast cancer Cousin        in 37's  . Esophageal cancer Maternal Grandfather   . Rectal cancer Neg Hx   . Stomach cancer Neg Hx     Social History   Tobacco Use  . Smoking status: Never Smoker  . Smokeless tobacco: Never Used  Substance Use Topics  . Alcohol use: Yes    Alcohol/week: 4.0 - 5.0 standard drinks    Types: 4 - 5 Standard drinks or equivalent per week    Subjective:    I connected with Lindsay Hampton on 11/16/19 at 11:20 AM EST by a video enabled telemedicine application and verified that I am speaking with the correct person using two identifiers. Provider in office/ patient is at home; provider and patient are only 2 people on video call.     I discussed the limitations of evaluation and management by telemedicine and the availability  of in person appointments. The patient expressed understanding and agreed to proceed.   Patient is concerned for intermittent headaches she has been experiencing "on and off" for the past 1-2 weeks; no prior history of migraine headaches; no history of hypertension but does not check her blood pressure regularly; unfortunately, patient has never been seen in office-has only done virtual visits so we have no recorded blood pressure reading for her; is also overdue to get the labs that were discussed earlier in the year; is using OTC Advil Cold and Sinus which does help the headache but it returns a few hours later. No vision changes, no speech changes;  LMP 3 weeks ago   Objective:  There were no vitals filed for this visit.  General: Well developed, well nourished, in no acute distress  Skin : Warm and dry.  Head: Normocephalic and atraumatic  Neck: Supple without thyromegaly, adenopathy  Lungs: Respirations unlabored;  Neurologic: Alert and oriented; speech intact; face symmetrical;   Assessment:  1. Acute nonintractable headache, unspecified headache type     Plan:  Will treat for possible sinus infection as patient notes she is prone to recurrent sinus issues and has had good response to Advil Cold and Sinus; She is encouraged to come  get her labs done as previously discussed- she agrees to go to [facility] tomorrow morning; she will also need to start checking her blood pressure regularly and keep log; Follow-up to be determined.   No follow-ups on file.  No orders of the defined types were placed in this encounter.   Requested Prescriptions    No prescriptions requested or ordered in this encounter

## 2019-11-17 ENCOUNTER — Other Ambulatory Visit: Payer: Self-pay | Admitting: Family

## 2019-11-17 ENCOUNTER — Other Ambulatory Visit (INDEPENDENT_AMBULATORY_CARE_PROVIDER_SITE_OTHER): Payer: Managed Care, Other (non HMO)

## 2019-11-17 DIAGNOSIS — E559 Vitamin D deficiency, unspecified: Secondary | ICD-10-CM

## 2019-11-17 DIAGNOSIS — Z13220 Encounter for screening for lipoid disorders: Secondary | ICD-10-CM | POA: Diagnosis not present

## 2019-11-17 DIAGNOSIS — R5383 Other fatigue: Secondary | ICD-10-CM | POA: Diagnosis not present

## 2019-11-17 LAB — LIPID PANEL
Cholesterol: 177 mg/dL (ref 0–200)
HDL: 54.1 mg/dL (ref >39.00)
LDL Cholesterol: 94 mg/dL (ref 0–99)
NonHDL: 122.72
Total CHOL/HDL Ratio: 3
Triglycerides: 143 mg/dL (ref 0.0–149.0)
VLDL: 28.6 mg/dL (ref 0.0–40.0)

## 2019-11-17 LAB — CBC WITH DIFFERENTIAL/PLATELET
Basophils Absolute: 0.1 10*3/uL (ref 0.0–0.1)
Basophils Relative: 0.5 % (ref 0.0–3.0)
Eosinophils Absolute: 0.2 10*3/uL (ref 0.0–0.7)
Eosinophils Relative: 1.8 % (ref 0.0–5.0)
HCT: 40.3 % (ref 36.0–46.0)
Hemoglobin: 13.7 g/dL (ref 12.0–15.0)
Lymphocytes Relative: 29.2 % (ref 12.0–46.0)
Lymphs Abs: 3 10*3/uL (ref 0.7–4.0)
MCHC: 34 g/dL (ref 30.0–36.0)
MCV: 93 fl (ref 78.0–100.0)
Monocytes Absolute: 0.7 10*3/uL (ref 0.1–1.0)
Monocytes Relative: 7.1 % (ref 3.0–12.0)
Neutro Abs: 6.2 10*3/uL (ref 1.4–7.7)
Neutrophils Relative %: 61.4 % (ref 43.0–77.0)
Platelets: 298 10*3/uL (ref 150.0–400.0)
RBC: 4.34 Mil/uL (ref 3.87–5.11)
RDW: 13.5 % (ref 11.5–15.5)
WBC: 10.1 10*3/uL (ref 4.0–10.5)

## 2019-11-17 LAB — COMPREHENSIVE METABOLIC PANEL
ALT: 31 U/L (ref 0–35)
AST: 24 U/L (ref 0–37)
Albumin: 4.3 g/dL (ref 3.5–5.2)
Alkaline Phosphatase: 39 U/L (ref 39–117)
BUN: 12 mg/dL (ref 6–23)
CO2: 20 mEq/L (ref 19–32)
Calcium: 9.5 mg/dL (ref 8.4–10.5)
Chloride: 103 mEq/L (ref 96–112)
Creatinine, Ser: 0.77 mg/dL (ref 0.40–1.20)
GFR: 78.58 mL/min (ref >60.00)
Glucose, Bld: 96 mg/dL (ref 70–99)
Potassium: 3.7 mEq/L (ref 3.5–5.1)
Sodium: 137 mEq/L (ref 135–145)
Total Bilirubin: 0.8 mg/dL (ref 0.2–1.2)
Total Protein: 7.3 g/dL (ref 6.0–8.3)

## 2019-11-17 LAB — VITAMIN D 25 HYDROXY (VIT D DEFICIENCY, FRACTURES): VITD: 19.42 ng/mL — ABNORMAL LOW (ref 30.00–100.00)

## 2019-11-17 LAB — TSH: TSH: 2.25 u[IU]/mL (ref 0.35–4.50)

## 2019-11-17 MED ORDER — VITAMIN D (ERGOCALCIFEROL) 1.25 MG (50000 UNIT) PO CAPS: 50000.0000 [IU] | ORAL_CAPSULE | ORAL | 0 refills | Status: AC

## 2020-01-07 ENCOUNTER — Other Ambulatory Visit: Payer: Self-pay | Admitting: Family

## 2020-01-07 DIAGNOSIS — F4322 Adjustment disorder with anxiety: Secondary | ICD-10-CM

## 2020-01-11 ENCOUNTER — Telehealth: Payer: Self-pay

## 2020-01-11 NOTE — Telephone Encounter (Signed)
  Medication Requested: ALPRAZolam (XANAX) 0.25 MG tablet  Is medication on med list: Yes  (if no, inform pt they may need an appointment)  Is medication a controled: Yes  (yes = last OV with PCP)  -Controlled Substances: Adderall, Ritalin, oxycodone, hydrocodone, methadone, alprazolam, etc  Last visit with PCP:  11/16/19   Is the OV > than 4 months: (yes = schedule an appt if one is not already made)  Pharmacy (Name, [facility]): [facility] [place]

## 2020-01-12 NOTE — Telephone Encounter (Signed)
Spoke with patient and info given.  She will call back to schedule appointment. She was going to check with her insurance company regarding wellness visit since she was due but might be seeing her GYN for upcoming appointment.

## 2020-01-12 NOTE — Telephone Encounter (Signed)
She needs an OV; we have never met her in person. She was supposed to be sending over blood pressure numbers for [place] to review as well.

## 2020-01-25 NOTE — Progress Notes (Deleted)
54 y.o. [id number] Divorced   Unavailable Declined Unavailable female here for annual exam.      No LMP recorded.          Sexually active: {yes no:314532}  The current method of family planning is {contraception:315051}.    Exercising: {yes no:314532}  {types:19826} Smoker:  {YES [id number]  Health Maintenance: Pap: 01/19/19 Normal Negative HR HPV 12/15/2017 ASCUS pos HPV  History of abnormal Pap:  Yes2019 ASCUS pos HPV, 12/26/2017 colposcopy CIN II, 01/13/18 HSIL negative margins and negative ECC, 2018 ASCUS + HR HPV- did not follow up. H/O cryosurgery at 20. MMG:  07/07/19 Density B Bi-rads 1 neg  BMD:   *** Colonoscopy: 02/24/2018, polyps, f/u in 5 year. TDaP:  2012 Gardasil: NA   reports that she has never smoked. She has never used smokeless tobacco. She reports current alcohol use of about 4.0 - 5.0 standard drinks of alcohol per week. She reports that she does not use drugs.  Past Medical History:  Diagnosis Date  . Allergy    seasonal    Past Surgical History:  Procedure Laterality Date  . CERVICAL BIOPSY  W/ LOOP ELECTRODE EXCISION  12/2017  . CESAREAN SECTION    . DILATION AND CURETTAGE OF UTERUS      Current Outpatient Medications  Medication Sig Dispense Refill  . ALPRAZolam (XANAX) 0.25 MG tablet Take 1 tablet (0.25 mg total) by mouth at bedtime as needed for anxiety. 30 tablet 0  . cefdinir (OMNICEF) 300 MG capsule Take 1 capsule (300 mg total) by mouth 2 (two) times daily. 20 capsule 0  . fluticasone (FLONASE) 50 MCG/ACT nasal spray Place 2 sprays into both nostrils daily. 16 g 6  . olopatadine (PATADAY) 0.1 % ophthalmic solution Place 1 drop into both eyes 2 (two) times daily. 5 mL 3  . Vitamin D, Ergocalciferol, (DRISDOL) 1.25 MG (50000 UT) CAPS capsule Take 1 capsule (50,000 Units total) by mouth every 7 (seven) days for 12 doses. 12 capsule 0  . Vitamin D, Ergocalciferol, 2000 units CAPS Take by mouth.     No current facility-administered medications for this  visit.    Family History  Problem Relation Age of Onset  . Breast cancer Mother        in her early 84's, now in her 52's  . Prostate cancer Father   . Colon cancer Maternal Grandmother 39  . Breast cancer Cousin        in 36's  . Esophageal cancer Maternal Grandfather   . Rectal cancer Neg Hx   . Stomach cancer Neg Hx     Review of Systems  Exam:   There were no vitals taken for this visit.  Weight change: @WEIGHTCHANGE @ Height:      Ht Readings from Last 3 Encounters:  01/19/19 5\' 6"  (1.676 m)  02/24/18 5\' 6"  (1.676 m)  12/15/17 5\' 6"  (1.676 m)    General appearance: alert, cooperative and appears stated age Head: Normocephalic, without obvious abnormality, atraumatic Neck: no adenopathy, supple, symmetrical, trachea midline and thyroid {CHL AMB PHY EX THYROID NORM [phone]::"normal to inspection and palpation"} Lungs: clear to auscultation bilaterally Cardiovascular: regular rate and rhythm Breasts: {Exam; breast:13139::"normal appearance, no masses or tenderness"} Abdomen: soft, non-tender; non distended,  no masses,  no organomegaly Extremities: extremities normal, atraumatic, no cyanosis or edema Skin: Skin color, texture, turgor normal. No rashes or lesions Lymph nodes: Cervical, supraclavicular, and axillary nodes normal. No abnormal inguinal nodes palpated Neurologic: Grossly normal   Pelvic: External  genitalia:  no lesions              Urethra:  normal appearing urethra with no masses, tenderness or lesions              Bartholins and Skenes: normal                 Vagina: normal appearing vagina with normal color and discharge, no lesions              Cervix: {CHL AMB PHY EX CERVIX NORM DEFAULT:[phone]::"no lesions"}               Bimanual Exam:  Uterus:  {CHL AMB PHY EX UTERUS NORM DEFAULT:[phone]::"normal size, contour, position, consistency, mobility, non-tender"}              Adnexa: {CHL AMB PHY EX ADNEXA NO MASS [phone]::"no  mass, fullness, tenderness"}               Rectovaginal: Confirms               Anus:  normal sphincter tone, no lesions  *** chaperoned for the exam.  A:  Well Woman with normal exam  P:

## 2020-01-26 ENCOUNTER — Ambulatory Visit: Payer: Managed Care, Other (non HMO) | Admitting: Obstetrics and Gynecology

## 2020-01-30 ENCOUNTER — Ambulatory Visit: Payer: Managed Care, Other (non HMO) | Attending: Internal Medicine

## 2020-01-30 DIAGNOSIS — Z23 Encounter for immunization: Secondary | ICD-10-CM | POA: Insufficient documentation

## 2020-01-30 NOTE — Progress Notes (Signed)
   Covid-19 Vaccination Clinic  Name:  Lindsay Hampton    MRN: [id number] DOB: [birth year]  01/30/2020  Ms. Lindsay Hampton was observed post Covid-19 immunization for 15 minutes without incident. She was provided with Vaccine Information Sheet and instruction to access the V-Safe system.   Ms. Lindsay Hampton was instructed to call 911 with any severe reactions post vaccine: Marland Kitchen Difficulty breathing  . Swelling of face and throat  . A fast heartbeat  . A bad rash all over body  . Dizziness and weakness   Immunizations Administered    Name Date Dose VIS Date Route   Pfizer COVID-19 Vaccine 01/30/2020  5:16 PM 0.3 mL 11/05/2019 Intramuscular   Manufacturer: [place]   Lot: [id number]   [place]: [id number]

## 2020-02-01 ENCOUNTER — Ambulatory Visit: Payer: Managed Care, Other (non HMO) | Admitting: Obstetrics and Gynecology

## 2020-03-01 ENCOUNTER — Ambulatory Visit: Payer: Managed Care, Other (non HMO) | Attending: Internal Medicine

## 2020-03-01 DIAGNOSIS — Z23 Encounter for immunization: Secondary | ICD-10-CM

## 2020-03-01 NOTE — Progress Notes (Signed)
   Covid-19 Vaccination Clinic  Name:  Lindsay Hampton    MRN: [id number] DOB: 18-Sep-1966  03/01/2020  Lindsay Hampton was observed post Covid-19 immunization for 15 minutes without incident. She was provided with Vaccine Information Sheet and instruction to access the V-Safe system.   Lindsay Hampton was instructed to call 911 with any severe reactions post vaccine: Marland Kitchen Difficulty breathing  . Swelling of face and throat  . A fast heartbeat  . A bad rash all over body  . Dizziness and weakness   Immunizations Administered    Name Date Dose VIS Date Route   Pfizer COVID-19 Vaccine 03/01/2020  4:10 PM 0.3 mL 11/05/2019 Intramuscular   Manufacturer: [place]   Lot: [id number]   [place]: [id number]

## 2020-03-07 NOTE — Progress Notes (Deleted)
54 y.o. [id number] Divorced White or Caucasian Not Hispanic or Latino female here for annual exam.      No LMP recorded.          Sexually active: {yes no:314532}  The current method of family planning is {contraception:315051}.    Exercising: {yes no:314532}  {types:19826} Smoker:  {YES [id number]  Health Maintenance: Pap: 01/19/19 normal, HPV neg,12/15/2017 ASCUS pos HPV, History of abnormal Pap:  Yes2019 ASCUS pos HPV, 12/26/2017 colposcopy CIN II, 01/13/18 HSIL negative margins and negative ECC, 2018 ASCUS + HR HPV- did not follow up. H/O cryosurgery at 60. MMG:  07/06/19 density B Bi-rads 1 neg  BMD:   Never  Colonoscopy:02/24/2018, polyps, f/u in 5 year.  TDaP:  2012 Gardasil:N/A   reports that she has never smoked. She has never used smokeless tobacco. She reports current alcohol use of about 4.0 - 5.0 standard drinks of alcohol per week. She reports that she does not use drugs.  Past Medical History:  Diagnosis Date  . Allergy    seasonal    Past Surgical History:  Procedure Laterality Date  . CERVICAL BIOPSY  W/ LOOP ELECTRODE EXCISION  12/2017  . CESAREAN SECTION    . DILATION AND CURETTAGE OF UTERUS      Current Outpatient Medications  Medication Sig Dispense Refill  . ALPRAZolam (XANAX) 0.25 MG tablet Take 1 tablet (0.25 mg total) by mouth at bedtime as needed for anxiety. 30 tablet 0  . cefdinir (OMNICEF) 300 MG capsule Take 1 capsule (300 mg total) by mouth 2 (two) times daily. 20 capsule 0  . fluticasone (FLONASE) 50 MCG/ACT nasal spray Place 2 sprays into both nostrils daily. 16 g 6  . olopatadine (PATADAY) 0.1 % ophthalmic solution Place 1 drop into both eyes 2 (two) times daily. 5 mL 3  . Vitamin D, Ergocalciferol, 2000 units CAPS Take by mouth.     No current facility-administered medications for this visit.    Family History  Problem Relation Age of Onset  . Breast cancer Mother        in her early 34's, now in her 73's  . Prostate cancer Father   . Colon  cancer Maternal Grandmother 66  . Breast cancer Cousin        in 37's  . Esophageal cancer Maternal Grandfather   . Rectal cancer Neg Hx   . Stomach cancer Neg Hx     Review of Systems  Exam:   There were no vitals taken for this visit.  Weight change: @WEIGHTCHANGE @ Height:      Ht Readings from Last 3 Encounters:  01/19/19 5\' 6"  (1.676 m)  02/24/18 5\' 6"  (1.676 m)  12/15/17 5\' 6"  (1.676 m)    General appearance: alert, cooperative and appears stated age Head: Normocephalic, without obvious abnormality, atraumatic Neck: no adenopathy, supple, symmetrical, trachea midline and thyroid {CHL AMB PHY EX THYROID NORM [phone]::"normal to inspection and palpation"} Lungs: clear to auscultation bilaterally Cardiovascular: regular rate and rhythm Breasts: {Exam; breast:13139::"normal appearance, no masses or tenderness"} Abdomen: soft, non-tender; non distended,  no masses,  no organomegaly Extremities: extremities normal, atraumatic, no cyanosis or edema Skin: Skin color, texture, turgor normal. No rashes or lesions Lymph nodes: Cervical, supraclavicular, and axillary nodes normal. No abnormal inguinal nodes palpated Neurologic: Grossly normal   Pelvic: External genitalia:  no lesions              Urethra:  normal appearing urethra with no masses, tenderness or lesions  Bartholins and Skenes: normal                 Vagina: normal appearing vagina with normal color and discharge, no lesions              Cervix: {CHL AMB PHY EX CERVIX NORM DEFAULT:[phone]::"no lesions"}               Bimanual Exam:  Uterus:  {CHL AMB PHY EX UTERUS NORM [phone]::"normal size, contour, position, consistency, mobility, non-tender"}              Adnexa: {CHL AMB PHY EX ADNEXA NO MASS DEFAULT:[phone]::"no mass, fullness, tenderness"}               Rectovaginal: Confirms               Anus:  normal sphincter tone, no lesions  *** chaperoned for the exam.  A:  Well  Woman with normal exam  P:

## 2020-03-09 ENCOUNTER — Ambulatory Visit: Payer: Managed Care, Other (non HMO) | Admitting: Obstetrics and Gynecology

## 2020-03-09 ENCOUNTER — Encounter: Payer: Self-pay | Admitting: Obstetrics and Gynecology

## 2020-03-21 ENCOUNTER — Other Ambulatory Visit: Payer: Self-pay

## 2020-03-21 NOTE — Progress Notes (Signed)
54 y.o. [id number] Divorced White or Caucasian Not Hispanic or Latino female here for annual exam.  Patient is in a new relationship and has been having more sex. She is complaining or urinary frequency but says she also drinks a lot of water.   She has been with her partner for 6 months, have known each other since high school. Getting married next week. She is so happy.  Cycles q month x 3-4 days. She can saturate a pad in 2-3 hours. No BTB, no cramps. No menopausal symptoms.     Patient's last menstrual period was 03/08/2020 (approximate).          Sexually active: Yes.    The current method of family planning is none. Aware there is a very small risk of pregnancy.  Exercising: Yes.    walks Smoker:  no  Health Maintenance: Pap:  01/19/19 neg HPV Neg, 12/15/2017 ASCUS pos HPV, 12-17-16 ASCUS + HR HPV History of abnormal Pap:  Yes, H/O LEEP  In 2/19 for HSIL, pathology with CIN II, negative margins, negative ECC. H/O cryosurgery at 2.  MMG:  10/03/19 Density B Bi-rads 1 neg  BMD:   Never  Colonoscopy:02/24/2018, polyps, f/u in 5 year. TDaP:  2012 Gardasil: N/A   reports that she has never smoked. She has never used smokeless tobacco. She reports current alcohol use of about 4.0 - 5.0 standard drinks of alcohol per week. She reports that she does not use drugs. 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 Both kids are local, daughter 79, son is 22. Both working.   Past Medical History:  Diagnosis Date  . Allergy    seasonal    Past Surgical History:  Procedure Laterality Date  . CERVICAL BIOPSY  W/ LOOP ELECTRODE EXCISION  12/2017  . CESAREAN SECTION    . DILATION AND CURETTAGE OF UTERUS      Current Outpatient Medications  Medication Sig Dispense Refill  . ALPRAZolam (XANAX) 0.25 MG tablet Take 1 tablet (0.25 mg total) by mouth at bedtime as needed for anxiety. 30 tablet 0  . fluticasone (FLONASE) 50 MCG/ACT  nasal spray Place 2 sprays into both nostrils daily. 16 g 6  . olopatadine (PATADAY) 0.1 % ophthalmic solution Place 1 drop into both eyes 2 (two) times daily. 5 mL 3  . Vitamin D, Ergocalciferol, (DRISDOL) 1.25 MG (50000 UNIT) CAPS capsule Take 50,000 Units by mouth every 7 (seven) days.     No current facility-administered medications for this visit.    Family History  Problem Relation Age of Onset  . Breast cancer Mother        in her early 39's, now in her 72's  . Prostate cancer Father   . Colon cancer Maternal Grandmother 56  . Breast cancer Cousin        in 23's  . Esophageal cancer Maternal Grandfather   . Rectal cancer Neg Hx   . Stomach cancer Neg Hx     Review of Systems  Genitourinary: Positive for frequency.  All other systems reviewed and are negative. voiding normal amount's. Increased frequency in the last month.   Exam:   BP 126/72   Pulse 83   Temp 97.9 F (36.6 C)   Ht 5' 5.5" (1.664 m)   Wt 223 lb (101.2 kg)   LMP 03/08/2020 (Approximate)   SpO2 98%   BMI 36.54 kg/m   Weight  change: @WEIGHTCHANGE @ Height:   Height: 5' 5.5" (166.4 cm)  Ht Readings from Last 3 Encounters:  03/22/20 5' 5.5" (1.664 m)  01/19/19 5\' 6"  (1.676 m)  02/24/18 5\' 6"  (1.676 m)    General appearance: alert, cooperative and appears stated age Head: Normocephalic, without obvious abnormality, atraumatic Neck: no adenopathy, supple, symmetrical, trachea midline and thyroid normal to inspection and palpation Lungs: clear to auscultation bilaterally Cardiovascular: regular rate and rhythm Breasts: normal appearance, no masses or tenderness Abdomen: soft, non-tender; non distended,  no masses,  no organomegaly Extremities: extremities normal, atraumatic, no cyanosis or edema Skin: Skin color, texture, turgor normal. No rashes or lesions Lymph nodes: Cervical, supraclavicular, and axillary nodes normal. No abnormal inguinal nodes palpated Neurologic: Grossly normal   Pelvic:  External genitalia:  no lesions              Urethra:  normal appearing urethra with no masses, tenderness or lesions              Bartholins and Skenes: normal                 Vagina: normal appearing vagina with normal color and discharge, no lesions              Cervix: no lesions               Bimanual Exam:  Uterus:  normal size, contour, position, consistency, mobility, non-tender              Adnexa: no mass, fullness, tenderness               Rectovaginal: Confirms               Anus:  normal sphincter tone, no lesions  Gae Dry chaperoned for the exam.  A:  Well Woman with normal exam  H/O LEEP in 2019  P:   Pap with hpv  Labs with primary  Mammogram and colonoscopy are UTD  Discussed breast self exam  Discussed calcium and vit D intake  Discussed perimenopause, call with concerns, call if she goes 2 months without a cycle.

## 2020-03-22 ENCOUNTER — Ambulatory Visit: Payer: Managed Care, Other (non HMO) | Admitting: Obstetrics and Gynecology

## 2020-03-22 ENCOUNTER — Encounter: Payer: Self-pay | Admitting: Obstetrics and Gynecology

## 2020-03-22 ENCOUNTER — Other Ambulatory Visit (HOSPITAL_COMMUNITY)
Admission: RE | Admit: 2020-03-22 | Discharge: 2020-03-22 | Disposition: A | Discharge status: Home / Self Care | Payer: Managed Care, Other (non HMO) | Source: Ambulatory Visit | Attending: Obstetrics and Gynecology | Admitting: Obstetrics and Gynecology

## 2020-03-22 VITALS — BP 126/72 | HR 83 | Temp 97.9°F | Ht 65.5 in | Wt 223.0 lb

## 2020-03-22 DIAGNOSIS — Z124 Encounter for screening for malignant neoplasm of cervix: Secondary | ICD-10-CM | POA: Diagnosis not present

## 2020-03-22 DIAGNOSIS — Z01419 Encounter for gynecological examination (general) (routine) without abnormal findings: Secondary | ICD-10-CM | POA: Diagnosis not present

## 2020-03-22 DIAGNOSIS — R350 Frequency of micturition: Secondary | ICD-10-CM

## 2020-03-22 DIAGNOSIS — Z113 Encounter for screening for infections with a predominantly sexual mode of transmission: Secondary | ICD-10-CM

## 2020-03-22 LAB — POCT URINALYSIS DIPSTICK
Bilirubin, UA: NEGATIVE
Blood, UA: NEGATIVE
Glucose, UA: NEGATIVE
Nitrite, UA: NEGATIVE
Protein, UA: NEGATIVE
Urobilinogen, UA: 4 E.U./dL — AB
pH, UA: 5 (ref 5.0–8.0)

## 2020-03-22 NOTE — Patient Instructions (Signed)
EXERCISE AND DIET:  We recommended that you start or continue a regular exercise program for good health. Regular exercise means any activity that makes your heart beat faster and makes you sweat.  We recommend exercising at least 30 minutes per day at least 3 days a week, preferably 4 or 5.  We also recommend a diet low in fat and sugar.  Inactivity, poor dietary choices and obesity can cause diabetes, heart attack, stroke, and kidney damage, among others.    ALCOHOL AND SMOKING:  Women should limit their alcohol intake to no more than 7 drinks/beers/glasses of wine (combined, not each!) per week. Moderation of alcohol intake to this level decreases your risk of breast cancer and liver damage. And of course, no recreational drugs are part of a healthy lifestyle.  And absolutely no smoking or even second hand smoke. Most people know smoking can cause heart and lung diseases, but did you know it also contributes to weakening of your bones? Aging of your skin?  Yellowing of your teeth and nails?  CALCIUM AND VITAMIN D:  Adequate intake of calcium and Vitamin D are recommended.  The recommendations for exact amounts of these supplements seem to change often, but generally speaking 1,000 mg of calcium (between diet and supplement) and 800 units of Vitamin D per day seems prudent. Certain women may benefit from higher intake of Vitamin D.  If you are among these women, your doctor will have told you during your visit.    PAP SMEARS:  Pap smears, to check for cervical cancer or precancers,  have traditionally been done yearly, although recent scientific advances have shown that most women can have pap smears less often.  However, every woman still should have a physical exam from her gynecologist every year. It will include a breast check, inspection of the vulva and vagina to check for abnormal growths or skin changes, a visual exam of the cervix, and then an exam to evaluate the size and shape of the uterus and  ovaries.  And after 53 years of age, a rectal exam is indicated to check for rectal cancers. We will also provide age appropriate advice regarding health maintenance, like when you should have certain vaccines, screening for sexually transmitted diseases, bone density testing, colonoscopy, mammograms, etc.   MAMMOGRAMS:  All women over 40 years old should have a yearly mammogram. Many facilities now offer a "3D" mammogram, which may cost around $50 extra out of pocket. If possible,  we recommend you accept the option to have the 3D mammogram performed.  It both reduces the number of women who will be called back for extra views which then turn out to be normal, and it is better than the routine mammogram at detecting truly abnormal areas.    COLON CANCER SCREENING: Now recommend starting at age 45. At this time colonoscopy is not covered for routine screening until 50. There are take home tests that can be done between 45-49.   COLONOSCOPY:  Colonoscopy to screen for colon cancer is recommended for all women at age 50.  We know, you hate the idea of the prep.  We agree, BUT, having colon cancer and not knowing it is worse!!  Colon cancer so often starts as a polyp that can be seen and removed at colonscopy, which can quite literally save your life!  And if your first colonoscopy is normal and you have no family history of colon cancer, most women don't have to have it again for   10 years.  Once every ten years, you can do something that may end up saving your life, right?  We will be happy to help you get it scheduled when you are ready.  Be sure to check your insurance coverage so you understand how much it will cost.  It may be covered as a preventative service at no cost, but you should check your particular policy.      Breast Self-Awareness Breast self-awareness means being familiar with how your breasts look and feel. It involves checking your breasts regularly and reporting any changes to your  health care provider. Practicing breast self-awareness is important. A change in your breasts can be a sign of a serious medical problem. Being familiar with how your breasts look and feel allows you to find any problems early, when treatment is more likely to be successful. All women should practice breast self-awareness, including women who have had breast implants. How to do a breast self-exam One way to learn what is normal for your breasts and whether your breasts are changing is to do a breast self-exam. To do a breast self-exam: Look for Changes  1. Remove all the clothing above your waist. 2. Stand in front of a mirror in a room with good lighting. 3. Put your hands on your hips. 4. Push your hands firmly downward. 5. Compare your breasts in the mirror. Look for differences between them (asymmetry), such as: ? Differences in shape. ? Differences in size. ? Puckers, dips, and bumps in one breast and not the other. 6. Look at each breast for changes in your skin, such as: ? Redness. ? Scaly areas. 7. Look for changes in your nipples, such as: ? Discharge. ? Bleeding. ? Dimpling. ? Redness. ? A change in position. Feel for Changes Carefully feel your breasts for lumps and changes. It is best to do this while lying on your back on the floor and again while sitting or standing in the shower or tub with soapy water on your skin. Feel each breast in the following way:  Place the arm on the side of the breast you are examining above your head.  Feel your breast with the other hand.  Start in the nipple area and make  inch (2 cm) overlapping circles to feel your breast. Use the pads of your three middle fingers to do this. Apply light pressure, then medium pressure, then firm pressure. The light pressure will allow you to feel the tissue closest to the skin. The medium pressure will allow you to feel the tissue that is a Shane deeper. The firm pressure will allow you to feel the tissue  close to the ribs.  Continue the overlapping circles, moving downward over the breast until you feel your ribs below your breast.  Move one finger-width toward the center of the body. Continue to use the  inch (2 cm) overlapping circles to feel your breast as you move slowly up toward your collarbone.  Continue the up and down exam using all three pressures until you reach your armpit.  Write Down What You Find  Write down what is normal for each breast and any changes that you find. Keep a written record with breast changes or normal findings for each breast. By writing this information down, you do not need to depend only on memory for size, tenderness, or location. Write down where you are in your menstrual cycle, if you are still menstruating. If you are having trouble noticing differences   in your breasts, do not get discouraged. With time you will become more familiar with the variations in your breasts and more comfortable with the exam. How often should I examine my breasts? Examine your breasts every month. If you are breastfeeding, the best time to examine your breasts is after a feeding or after using a breast pump. If you menstruate, the best time to examine your breasts is 5-7 days after your period is over. During your period, your breasts are lumpier, and it may be more difficult to notice changes. When should I see my health care provider? See your health care provider if you notice:  A change in shape or size of your breasts or nipples.  A change in the skin of your breast or nipples, such as a reddened or scaly area.  Unusual discharge from your nipples.  A lump or thick area that was not there before.  Pain in your breasts.  Anything that concerns you.  

## 2020-03-23 LAB — URINALYSIS, MICROSCOPIC ONLY
Casts: NONE SEEN /lpf
WBC, UA: NONE SEEN /hpf (ref 0–5)

## 2020-03-23 LAB — GC/CHLAMYDIA PROBE AMP
Chlamydia trachomatis, NAA: NEGATIVE
Neisseria Gonorrhoeae by PCR: NEGATIVE

## 2020-03-24 LAB — CYTOLOGY - PAP
Comment: NEGATIVE
Diagnosis: UNDETERMINED — AB
High risk HPV: NEGATIVE

## 2020-03-24 LAB — URINE CULTURE: Organism ID, Bacteria: NO GROWTH

## 2020-03-29 ENCOUNTER — Other Ambulatory Visit: Payer: Self-pay | Admitting: *Deleted

## 2020-03-29 DIAGNOSIS — R87610 Atypical squamous cells of undetermined significance on cytologic smear of cervix (ASC-US): Secondary | ICD-10-CM

## 2020-03-31 ENCOUNTER — Telehealth: Payer: Self-pay | Admitting: Obstetrics and Gynecology

## 2020-03-31 NOTE — Telephone Encounter (Signed)
Call placed to convey benefits. Spoke with the patient and conveyed the benefits. Patient understands/agreeable with the benefits. Patient to call with cycle for scheduling. 

## 2020-04-06 NOTE — Telephone Encounter (Signed)
Patient would like to delay Leep procedure until new insurance is active next month.

## 2020-04-10 ENCOUNTER — Telehealth: Payer: Self-pay

## 2020-04-10 NOTE — Telephone Encounter (Signed)
Patient is calling in regards to questions about LEEP procedure.

## 2020-04-10 NOTE — Telephone Encounter (Signed)
Spoke with patient. Reviewed 03/22/20 PAP results per Dr. Talbert Nan, colpo recommended. Reviewed with patient difference b/w Colpo and LEEP, questions answered. Patient agreeable to proceed with scheduling colpo. LMP 04/06/20. Colpo scheduled for 5/20 at 2:30pm with Dr. Talbert Nan. Advised to take Motrin 800 mg with food and water one hour before procedure. Message to business office regarding benefits. Patient verbalizes understanding and is agreeable.   Routing to provider for final review. Patient is agreeable to disposition. Will close encounter.   Cc: Wilson Singer, Magdalene Patricia

## 2020-04-10 NOTE — Telephone Encounter (Signed)
Opened in error

## 2020-04-13 ENCOUNTER — Encounter: Payer: Self-pay | Admitting: Obstetrics and Gynecology

## 2020-04-13 ENCOUNTER — Other Ambulatory Visit (HOSPITAL_COMMUNITY)
Admission: RE | Admit: 2020-04-13 | Discharge: 2020-04-13 | Disposition: A | Discharge status: Home / Self Care | Payer: Managed Care, Other (non HMO) | Source: Ambulatory Visit | Attending: Obstetrics and Gynecology | Admitting: Obstetrics and Gynecology

## 2020-04-13 ENCOUNTER — Ambulatory Visit: Payer: 59 | Admitting: Obstetrics and Gynecology

## 2020-04-13 ENCOUNTER — Other Ambulatory Visit: Payer: Self-pay

## 2020-04-13 VITALS — BP 122/62 | HR 91 | Temp 98.1°F | Ht 66.0 in | Wt 224.0 lb

## 2020-04-13 DIAGNOSIS — R87610 Atypical squamous cells of undetermined significance on cytologic smear of cervix (ASC-US): Secondary | ICD-10-CM

## 2020-04-13 NOTE — Patient Instructions (Signed)

## 2020-04-13 NOTE — Progress Notes (Signed)
GYNECOLOGY  VISIT   HPI: 54 y.o.   Married White or Caucasian Not Hispanic or Latino  female   [phone] with Patient's last menstrual period was 04/06/2020.   here for evaluation of an ASCUS, negative HPV pap   H/O LEEP in 2/19 for HSIL, pathology with CIN II, negative margins, negative ECC. She has had a new partner since then (now married). Pap from 01/19/19 was negative with negative HPV.  GYNECOLOGIC HISTORY: Patient's last menstrual period was 04/06/2020. Contraception:none  Menopausal hormone therapy: no        OB History    Gravida  3   Para  2   Term  2   Preterm      AB  1   Living  2     SAB  1   TAB      Ectopic      Multiple      Live Births  2              There are no problems to display for this patient.   Past Medical History:  Diagnosis Date  . Allergy    seasonal    Past Surgical History:  Procedure Laterality Date  . CERVICAL BIOPSY  W/ LOOP ELECTRODE EXCISION  12/2017  . CESAREAN SECTION    . DILATION AND CURETTAGE OF UTERUS      Current Outpatient Medications  Medication Sig Dispense Refill  . ALPRAZolam (XANAX) 0.25 MG tablet Take 1 tablet (0.25 mg total) by mouth at bedtime as needed for anxiety. 30 tablet 0  . fluticasone (FLONASE) 50 MCG/ACT nasal spray Place 2 sprays into both nostrils daily. 16 g 6  . olopatadine (PATADAY) 0.1 % ophthalmic solution Place 1 drop into both eyes 2 (two) times daily. 5 mL 3  . Vitamin D, Ergocalciferol, (DRISDOL) 1.25 MG (50000 UNIT) CAPS capsule Take 50,000 Units by mouth every 7 (seven) days.     No current facility-administered medications for this visit.     ALLERGIES: Patient has no known allergies.  Family History  Problem Relation Age of Onset  . Breast cancer Mother        in her early 76's, now in her 82's  . Prostate cancer Father   . Colon cancer Maternal Grandmother 12  . Breast cancer Cousin        in 29's  . Esophageal cancer Maternal Grandfather   . Rectal cancer Neg Hx    . Stomach cancer Neg Hx     Social History   Socioeconomic History  . Marital status: Married    Spouse name: Not on file  . Number of children: Not on file  . Years of education: Not on file  . Highest education level: Not on file  Occupational History  . Not on file  Tobacco Use  . Smoking status: Never Smoker  . Smokeless tobacco: Never Used  Substance and Sexual Activity  . Alcohol use: Yes    Alcohol/week: 4.0 - 5.0 standard drinks    Types: 4 - 5 Standard drinks or equivalent per week  . Drug use: No  . Sexual activity: Not Currently    Partners: Male    Birth control/protection: None  Other Topics Concern  . Not on file  Social History Narrative  . Not on file   Social Determinants of Health   Financial Resource Strain:   . Difficulty of Paying Living Expenses:   Food Insecurity:   . Worried About Estate manager/land agent  of Food in the Last Year:   . [place] in the Last Year:   Transportation Needs:   . Lack of Transportation (Medical):   Marland Kitchen Lack of Transportation (Non-Medical):   Physical Activity:   . Days of Exercise per Week:   . Minutes of Exercise per Session:   Stress:   . Feeling of Stress :   Social Connections:   . Frequency of Communication with Friends and Family:   . Frequency of Social Gatherings with Friends and Family:   . Attends Religious Services:   . Active Member of Clubs or Organizations:   . Attends Archivist Meetings:   Marland Kitchen Marital Status:   Intimate Partner Violence:   . Fear of Current or Ex-Partner:   . Emotionally Abused:   Marland Kitchen Physically Abused:   . Sexually Abused:     ROS  PHYSICAL EXAMINATION:    BP 122/62   Pulse 91   Temp 98.1 F (36.7 C)   Ht 5\' 6"  (1.676 m)   Wt 224 lb (101.6 kg)   LMP 04/06/2020   SpO2 99%   BMI 36.15 kg/m     General appearance: alert, cooperative and appears stated age   Pelvic: External genitalia:  no lesions              Urethra:  normal appearing urethra with no masses,  tenderness or lesions              Bartholins and Skenes: normal                 Vagina: normal appearing vagina with normal color and discharge, no lesions              Cervix: no lesions              Colposcopy: satisfactory, mild aceto-white changes on the anterior cervix, biopsy taken at 12 o'clock, ECC done. Biopsy site treated with silver nitrate. Negative lugols examination of the upper vagina.  Chaperone was present for exam.  ASSESSMENT ASCUS pap with negative HPV, h/o LEEP in 2/19 for CIN II, negative margins    PLAN Colposcopy with biopsy and ECC   An After Visit Summary was printed and given to the patient.

## 2020-04-20 ENCOUNTER — Telehealth: Payer: Self-pay | Admitting: Obstetrics and Gynecology

## 2020-04-20 LAB — SURGICAL PATHOLOGY

## 2020-04-20 NOTE — Telephone Encounter (Signed)
Disregard previous note, Patient had procedure 04/13/20.

## 2020-04-20 NOTE — Telephone Encounter (Signed)
Patient called and gave new insurance and is ready to schedule procedure.

## 2020-04-21 ENCOUNTER — Telehealth: Payer: Self-pay | Admitting: Obstetrics and Gynecology

## 2020-04-21 NOTE — Telephone Encounter (Signed)
Patient called for recent biopsy result from 04/13/20.

## 2020-04-21 NOTE — Telephone Encounter (Addendum)
Spoke with pt. Pt given results of biopsy per Dr Talbert Nan. Pt agreeable and verbalized understanding.  Pt states will call back to schedule next AEX appt due in 02/2021 with repeat pap.   Routing to Dr Talbert Nan  Encounter closed.  12 recall placed.   Message Received: Today Message Contents  Salvadore Dom, MD  Georgia Lopes, RN  Please inform cervical biopsy returned with CIN I, negative ECC. She needs a repeat pap in one year.

## 2020-04-21 NOTE — Telephone Encounter (Addendum)
Pt calling for results of cervical biopsy on 04/13/20. Printed path report and placed on Dr Gentry Fitz desk to review and recommend.   Routing to Dr Talbert Nan.

## 2020-04-21 NOTE — Telephone Encounter (Signed)
Please see result note 

## 2020-06-07 ENCOUNTER — Other Ambulatory Visit: Payer: Self-pay | Admitting: Obstetrics and Gynecology

## 2020-06-07 DIAGNOSIS — Z1231 Encounter for screening mammogram for malignant neoplasm of breast: Secondary | ICD-10-CM

## 2020-06-25 HISTORY — PX: BREAST SURGERY: SHX581

## 2020-07-06 ENCOUNTER — Ambulatory Visit
Admission: RE | Admit: 2020-07-06 | Discharge: 2020-07-06 | Disposition: A | Discharge status: Home / Self Care | Payer: 59 | Source: Ambulatory Visit | Attending: Obstetrics and Gynecology | Admitting: Obstetrics and Gynecology

## 2020-07-06 ENCOUNTER — Other Ambulatory Visit: Payer: Self-pay

## 2020-07-06 DIAGNOSIS — Z1231 Encounter for screening mammogram for malignant neoplasm of breast: Secondary | ICD-10-CM

## 2020-07-06 IMAGING — MG DIGITAL SCREENING BILAT W/ TOMO W/ CAD
8 series · 8 of 24 positions shown · non-contrast
Comparison: Previous exam(s).

CLINICAL DATA: Screening.

EXAM:
DIGITAL SCREENING BILATERAL MAMMOGRAM WITH TOMO AND CAD

[L MLO synth-2D]
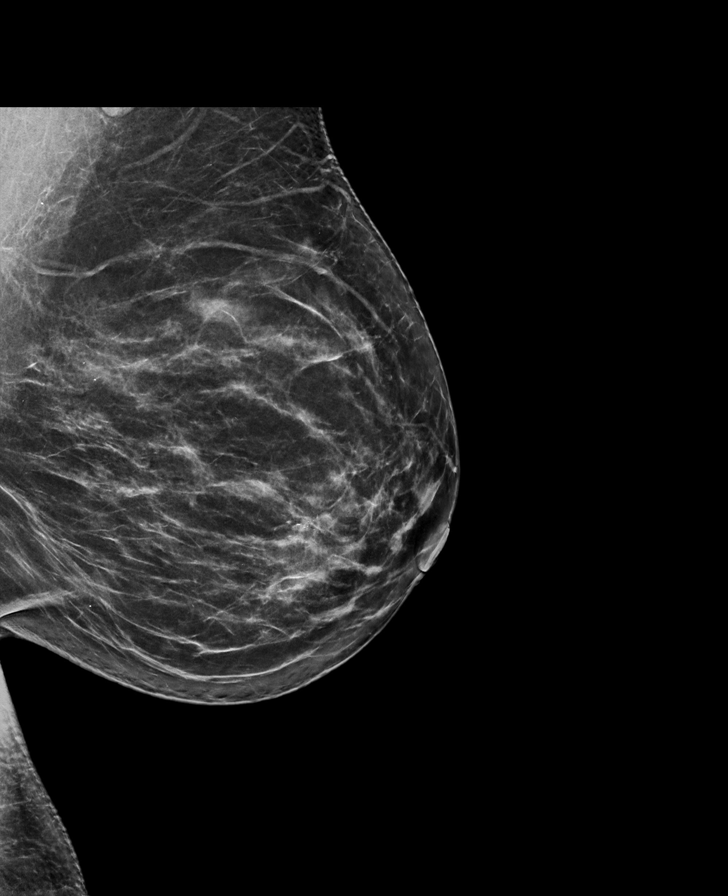

[R MLO synth-2D]
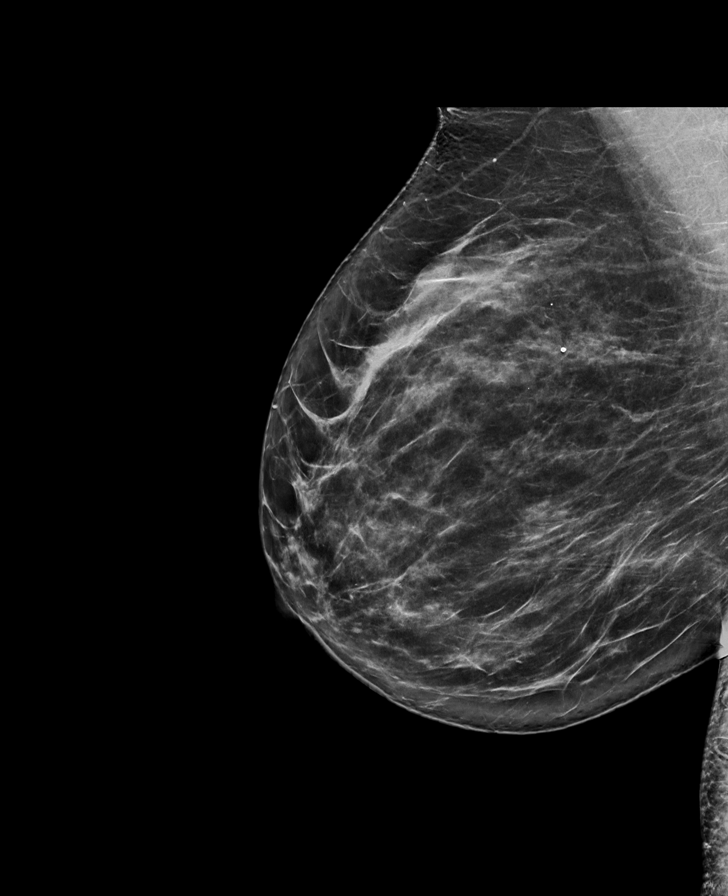

[R CC synth-2D]
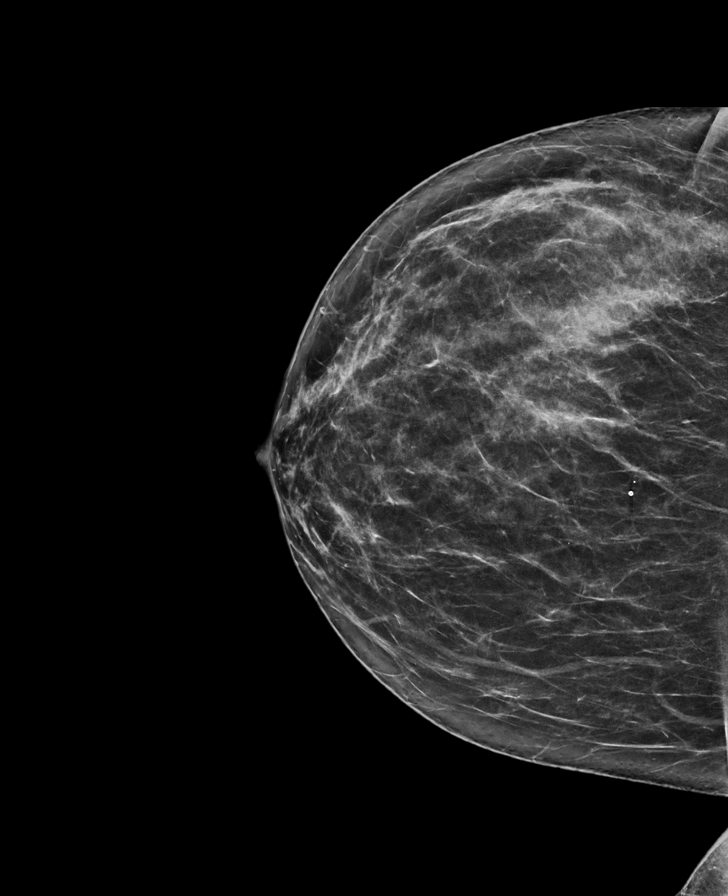

[L CC synth-2D]
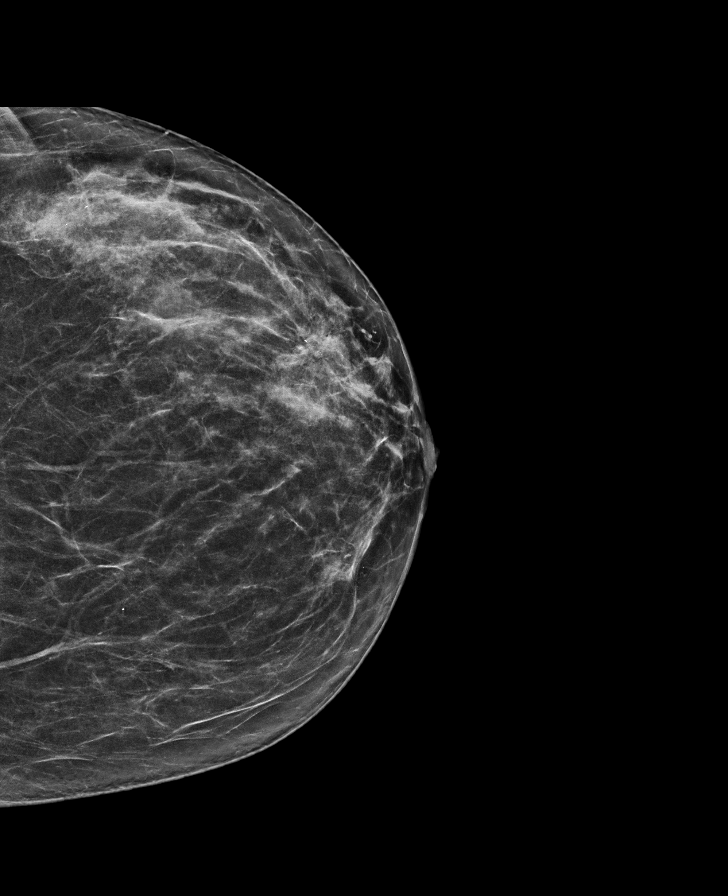

[R CC tomo · tomo slice 35/69.0]
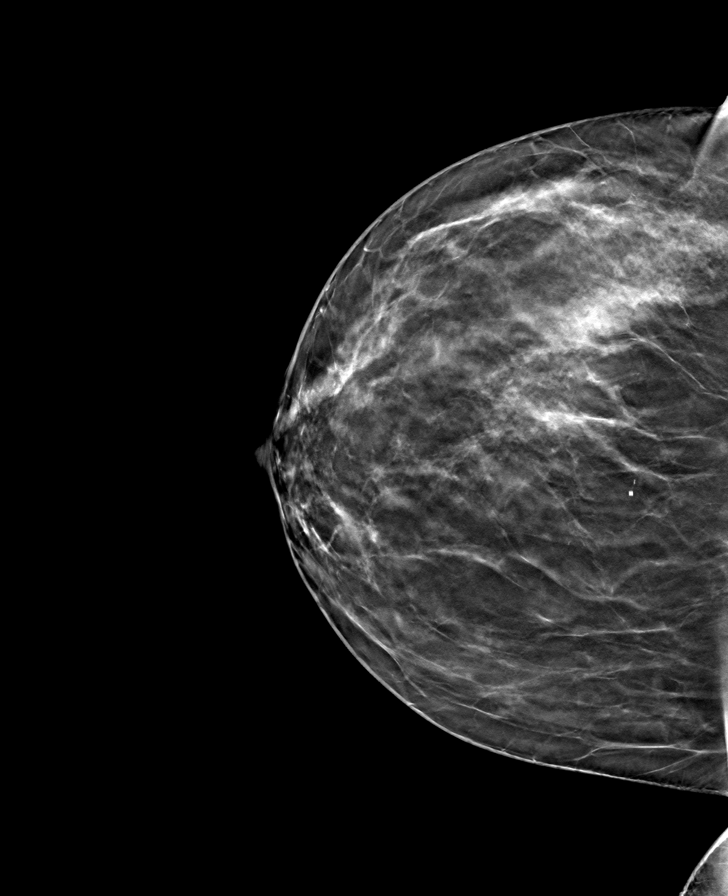

[L CC tomo · tomo slice 35/70.0]
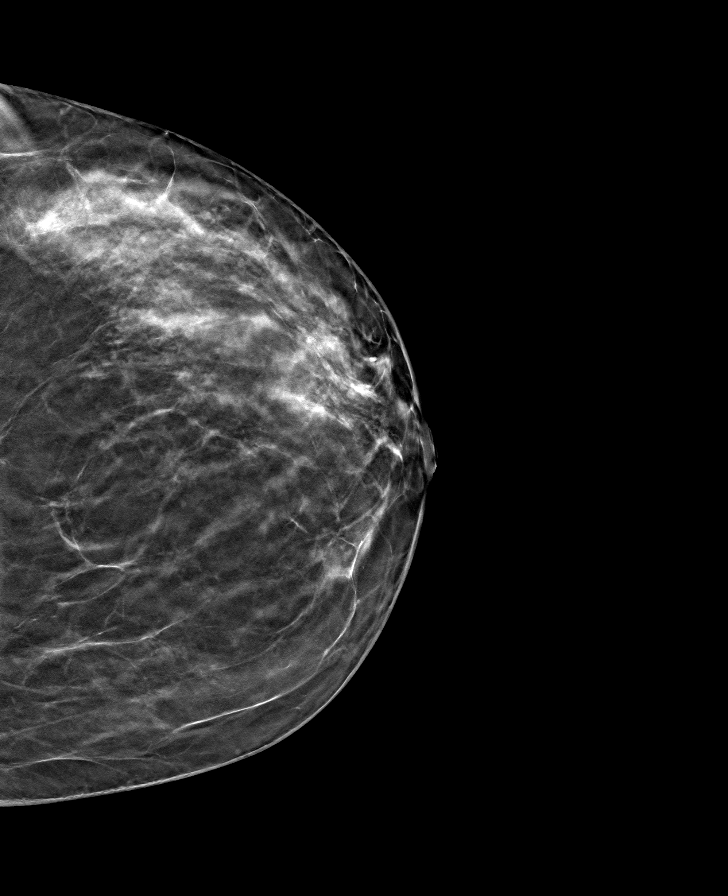

[L MLO tomo · tomo slice 41/80.0]
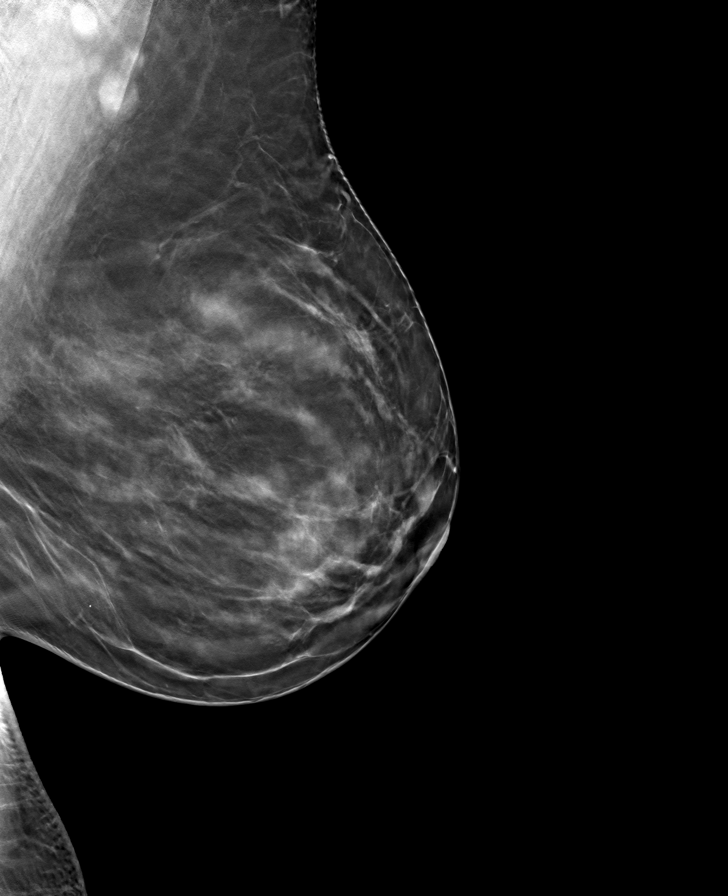

[R MLO tomo · tomo slice 43/84.0]
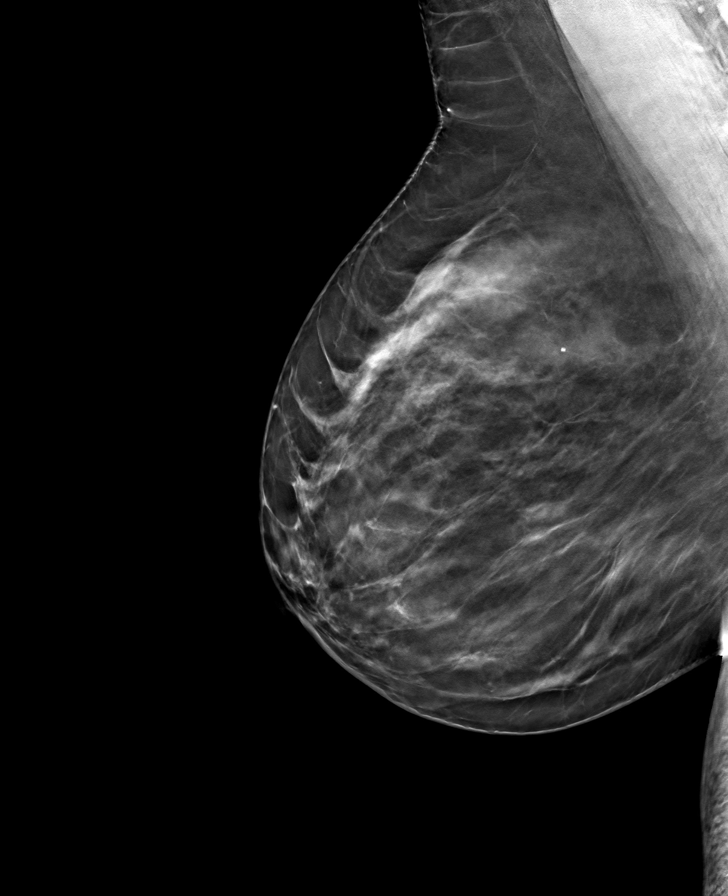

[8 of 24 positions shown; findings below may reference images not displayed]

ACR Breast Density Category b: There are scattered areas of
fibroglandular density.
FINDINGS: In the left breast, possible distortion with associated
calcifications warrants further evaluation. It is best seen in the
CC slice 54 and MLO slice 62. It is in the anterior outer breast at
4 o'clock. In addition, LEFT axillary lymph nodes are increased in
size.

In the RIGHT upper outer breast, there is possible global asymmetry.
Images were processed with CAD.
IMPRESSION: 1. Further evaluation is suggested for possible distortion with
associated calcifications in the left breast.

2. Further evaluation is suggested for increased size of LEFT
axillary lymph nodes.

3. Further evaluation is suggested for global asymmetry of the RIGHT
upper outer breast. Recommend correlation with any weight loss or
hormonal use.

RECOMMENDATION:
1. Diagnostic mammogram and ultrasound of the left breast and
axilla. (Code:[WQ])

2. Diagnostic mammogram and possibly ultrasound of the right breast.
(Code:[WQ])

The patient will be contacted regarding the findings, and additional
imaging will be scheduled.

BI-RADS CATEGORY  0: Incomplete. Need additional imaging evaluation
and/or prior mammograms for comparison.

## 2020-07-07 ENCOUNTER — Other Ambulatory Visit: Payer: Self-pay | Admitting: Obstetrics and Gynecology

## 2020-07-07 DIAGNOSIS — R928 Other abnormal and inconclusive findings on diagnostic imaging of breast: Secondary | ICD-10-CM

## 2020-07-11 ENCOUNTER — Telehealth: Payer: Self-pay | Admitting: Family

## 2020-07-11 ENCOUNTER — Other Ambulatory Visit: Payer: Self-pay | Admitting: Family

## 2020-07-11 DIAGNOSIS — F4322 Adjustment disorder with anxiety: Secondary | ICD-10-CM

## 2020-07-11 MED ORDER — ALPRAZOLAM 0.25 MG PO TABS: 0.2500 mg | ORAL_TABLET | Freq: Every evening | ORAL | 0 refills | Status: DC | PRN

## 2020-07-11 NOTE — Telephone Encounter (Signed)
It would be more appropriate for her to discuss with her GYN since they are managing for her.

## 2020-07-11 NOTE — Telephone Encounter (Signed)
New message:   Pt is calling would like some medication to help with her her nerves. She states she is having a diagnostic mammogram on tomorrow and she just needs something to help calm her down. Please advise.    [id number] - [place], [place] - [address]

## 2020-07-11 NOTE — Telephone Encounter (Signed)
Pt states that her GYN office not prescribe Xanax. She stated that she only needs one pill sent in. Please advise.

## 2020-07-12 ENCOUNTER — Other Ambulatory Visit: Payer: Self-pay

## 2020-07-12 ENCOUNTER — Ambulatory Visit
Admission: RE | Admit: 2020-07-12 | Discharge: 2020-07-12 | Disposition: A | Discharge status: Home / Self Care | Payer: 59 | Source: Ambulatory Visit | Attending: Obstetrics and Gynecology | Admitting: Obstetrics and Gynecology

## 2020-07-12 ENCOUNTER — Other Ambulatory Visit: Payer: Self-pay | Admitting: Obstetrics and Gynecology

## 2020-07-12 DIAGNOSIS — R599 Enlarged lymph nodes, unspecified: Secondary | ICD-10-CM

## 2020-07-12 DIAGNOSIS — R928 Other abnormal and inconclusive findings on diagnostic imaging of breast: Secondary | ICD-10-CM

## 2020-07-12 DIAGNOSIS — N6320 Unspecified lump in the left breast, unspecified quadrant: Secondary | ICD-10-CM

## 2020-07-12 IMAGING — US US BREAST*L* LIMITED INC AXILLA
1 series · 13 of 15 positions shown · non-contrast
Comparison: Previous exam(s).

CLINICAL DATA: Screening recall for a possible distortion and
associated calcifications in the left breast. A possible abnormal
left axillary lymph node, and a more global asymmetry in the right
breast. Patient has had no previous surgery and is not on hormone
replacement therapy.

EXAM:
DIGITAL DIAGNOSTIC BILATERAL MAMMOGRAM WITH CAD AND TOMO
ULTRASOUND BILATERAL BREAST

[Series 1: us breast*left* limited inc axilla · 0.08mm/px · 13 of 15 slices shown]
[im 1/15]
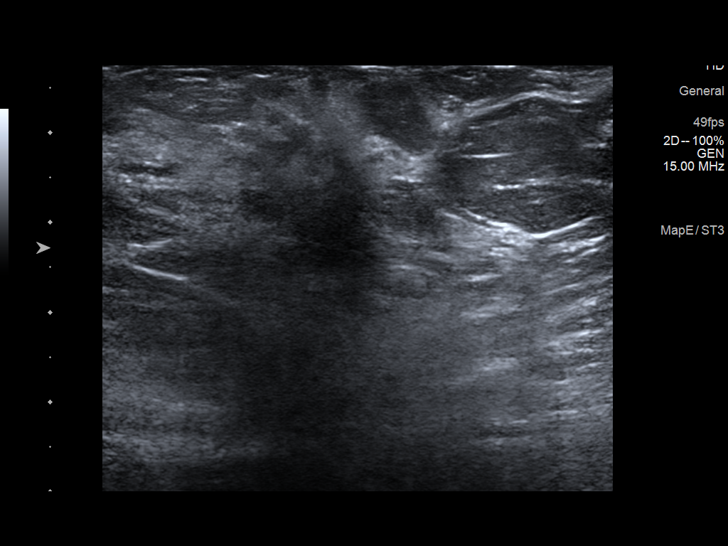
[im 2/15]
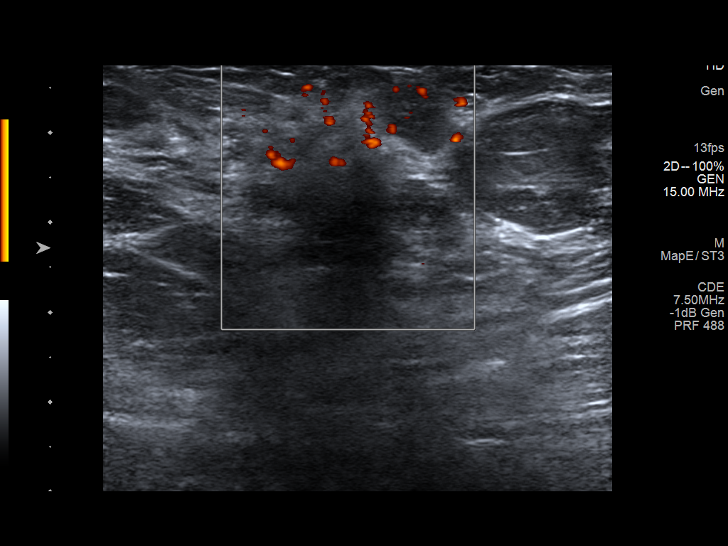
[im 3/15]
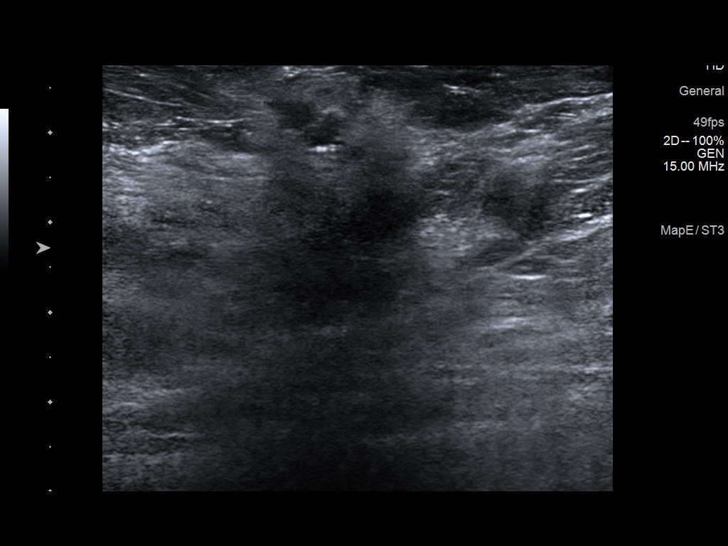
[im 5/15]
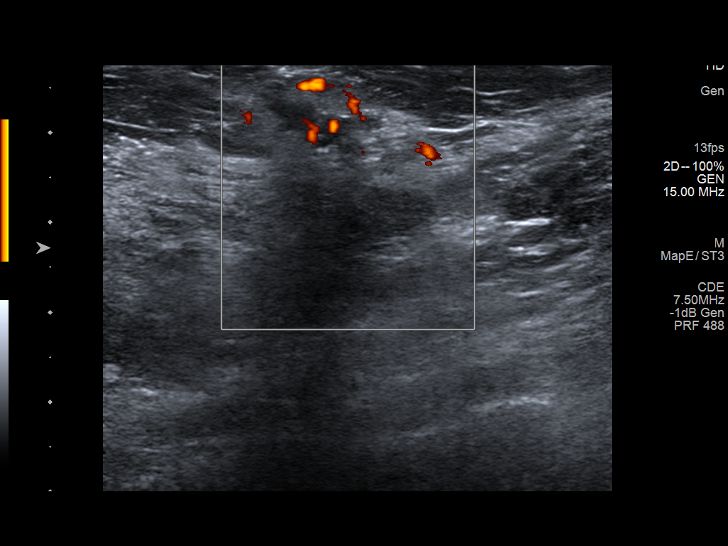
[im 6/15]
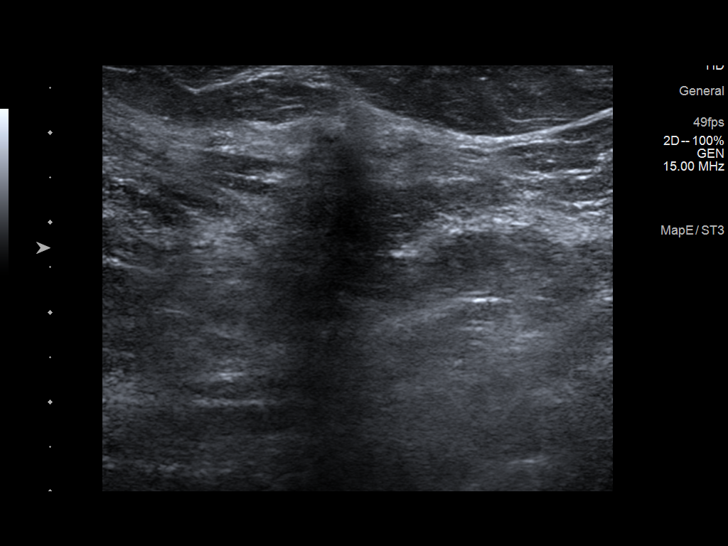
[im 7/15]
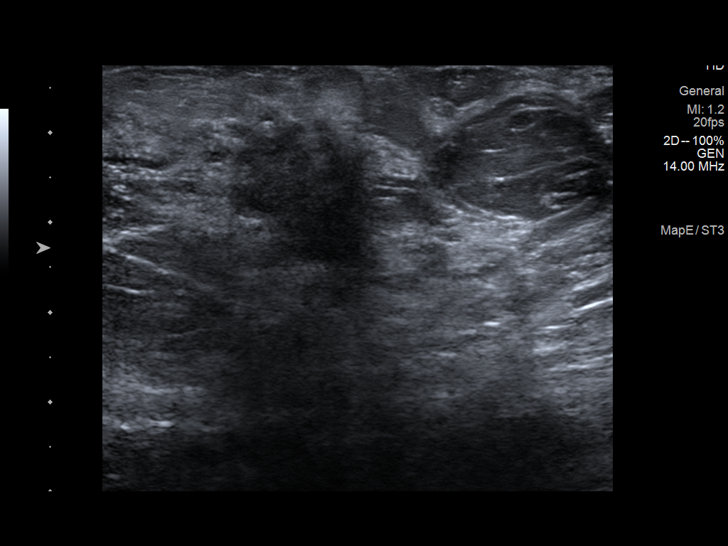
[im 8/15]
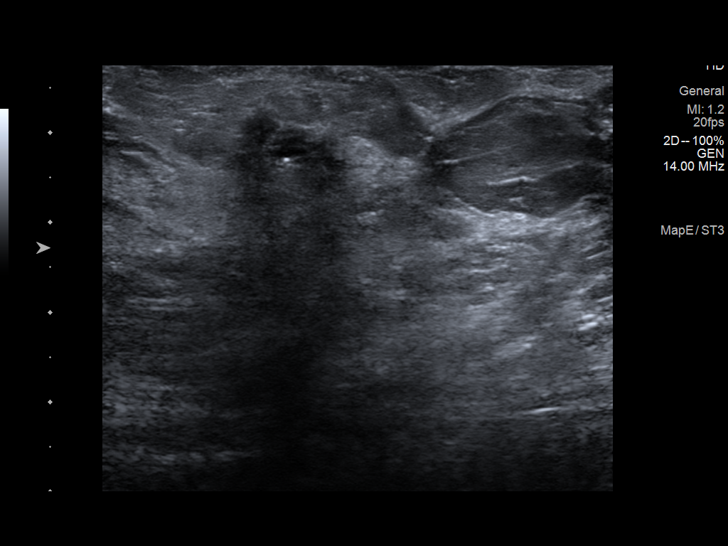
[im 9/15]
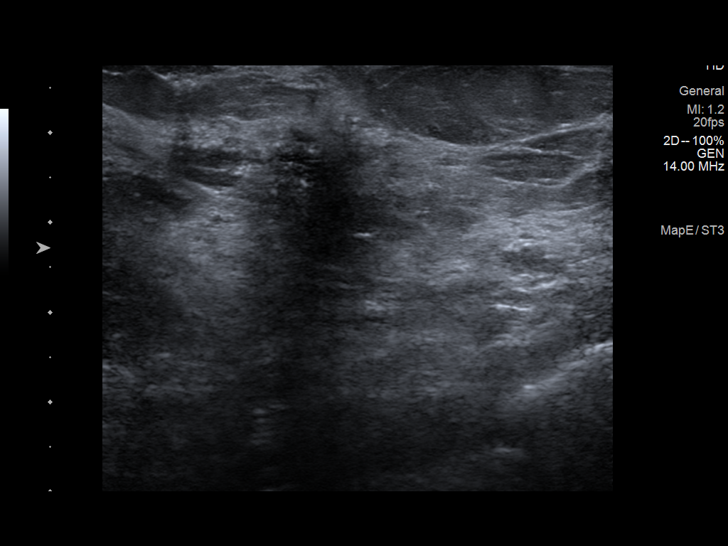
[im 10/15]
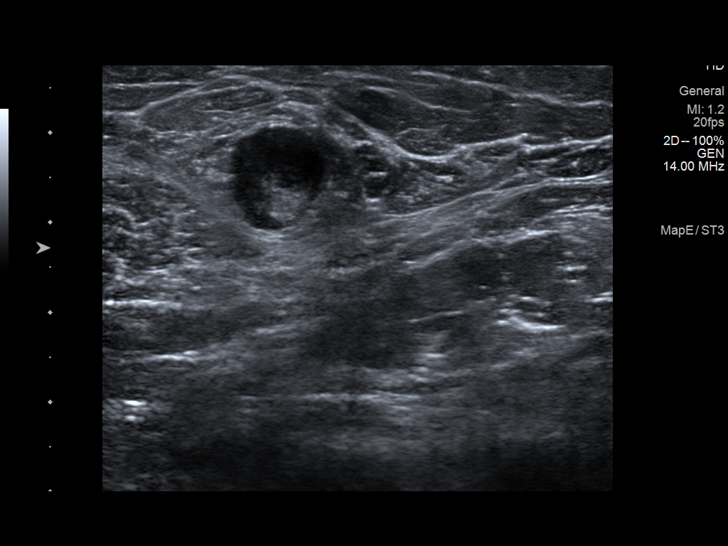
[im 11/15]
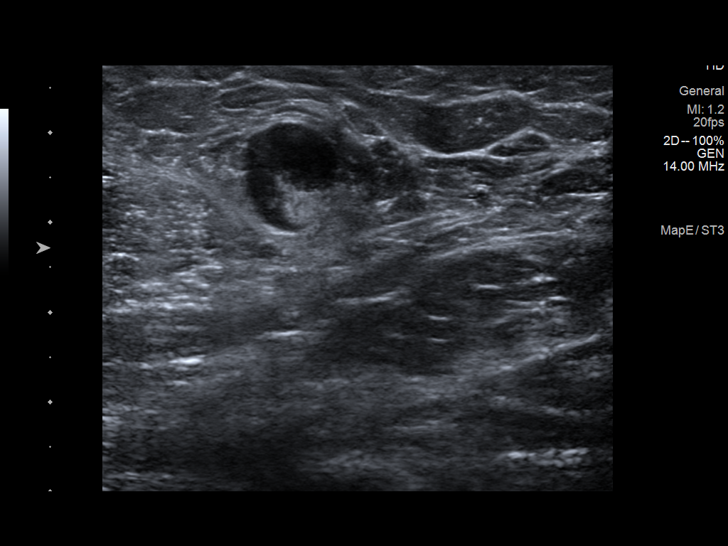
[im 13/15]
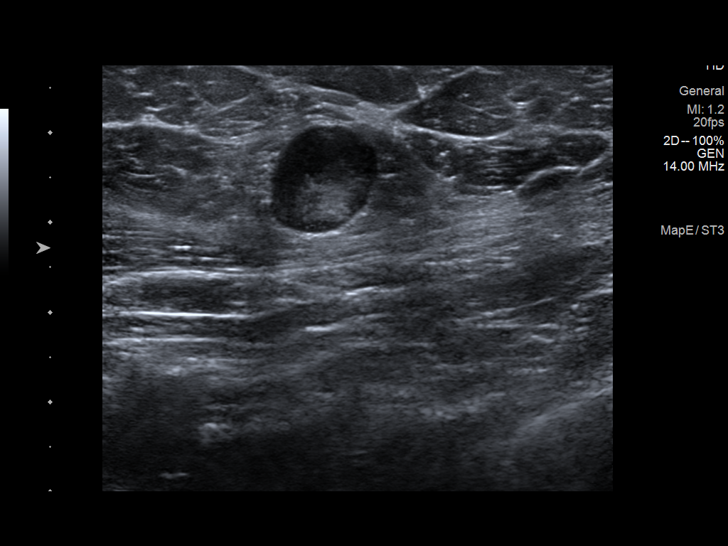
[im 14/15]
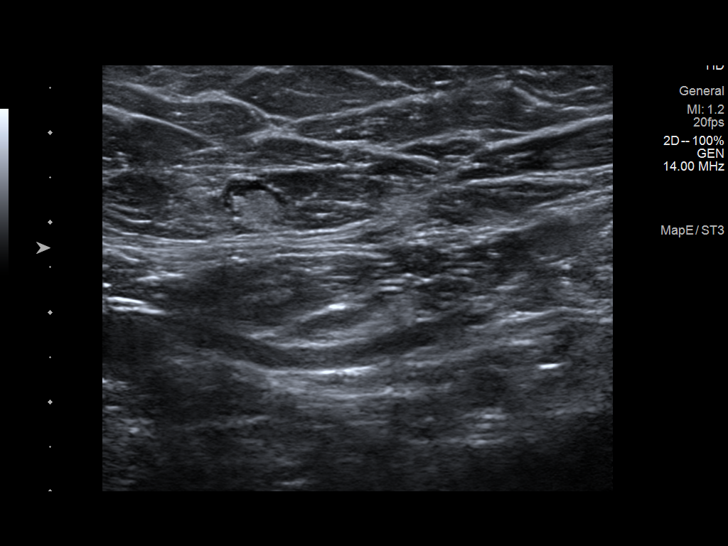
[im 15/15]
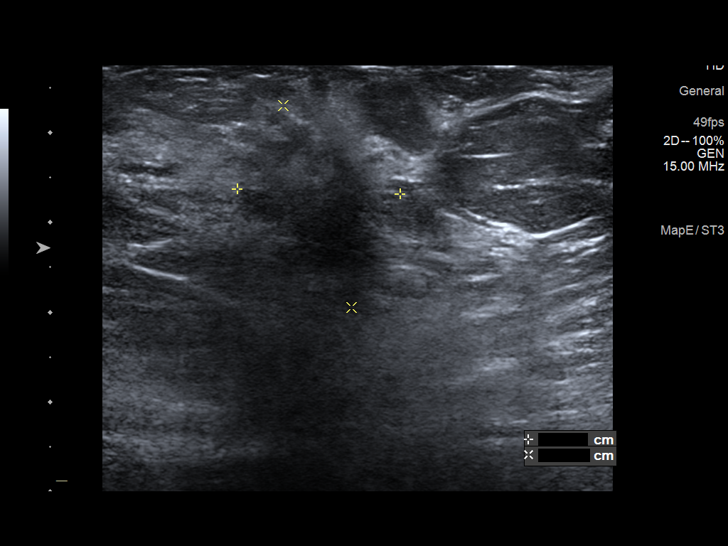

[13 of 15 positions shown; findings below may reference images not displayed]

ACR Breast Density Category b: There are scattered areas of
fibroglandular density.
FINDINGS: Left breast: The area of distortion persists on spot compression
imaging, associated with small irregular/pleomorphic calcifications,
and a possible underlying mass measuring approximately 7 mm. In the
left axilla, a single partly imaged lymph node appears thickened.

Right breast: The area asymmetry noted in the upper outer right
breast appears as fibroglandular tissue on the spot compression
tomosynthesis images. There is no underlying mass or discrete
asymmetry and there is no architectural distortion. There are no
suspicious calcifications.

Mammographic images were processed with CAD.

On physical exam, there is a possible small mass in the left breast
lateral to the nipple.

Targeted left breast ultrasound is performed, showing an irregular
hypoechoic mass at 3:30 o'clock, 3 cm the nipple, with several
associated echogenic foci reflecting calcifications. The mass
measures approximately 2.4 x 1.8 x 2.2 cm. In the left axilla, there
is a single abnormal lymph node with a maximal cortical thickness of
6 mm.

Targeted right breast ultrasound is performed, showing normal
fibroglandular tissue throughout the entire upper outer right
breast. No mass or suspicious lesion. No abnormal shadowing.
IMPRESSION: 1. 2.4 cm highly suspicious mass in the left breast at 3:30 o'clock,
3 cm from the nipple. Tissue sampling is recommended.
2. Single abnormal left axillary lymph node. Tissue sampling is
recommended.
3. No evidence of right breast malignancy.

RECOMMENDATION:
1. Ultrasound-guided core needle biopsy of the 3:30 o'clock position
left breast mass and the single abnormal left axillary lymph node.
This procedure was scheduled for [REDACTED], [DATE][REDACTED], [LV], and [DATE]
a.m.

I have discussed the findings and recommendations with the patient.
If applicable, a reminder letter will be sent to the patient
regarding the next appointment.

BI-RADS CATEGORY  5: Highly suggestive of malignancy.

## 2020-07-12 IMAGING — MG DIGITAL DIAGNOSTIC BILAT W/ TOMO W/ CAD
8 of 11 series · 8 of 27 positions shown · non-contrast
Comparison: Previous exam(s).

CLINICAL DATA: Screening recall for a possible distortion and
associated calcifications in the left breast. A possible abnormal
left axillary lymph node, and a more global asymmetry in the right
breast. Patient has had no previous surgery and is not on hormone
replacement therapy.

EXAM:
DIGITAL DIAGNOSTIC BILATERAL MAMMOGRAM WITH CAD AND TOMO
ULTRASOUND BILATERAL BREAST

[L ML (1 of 2)]
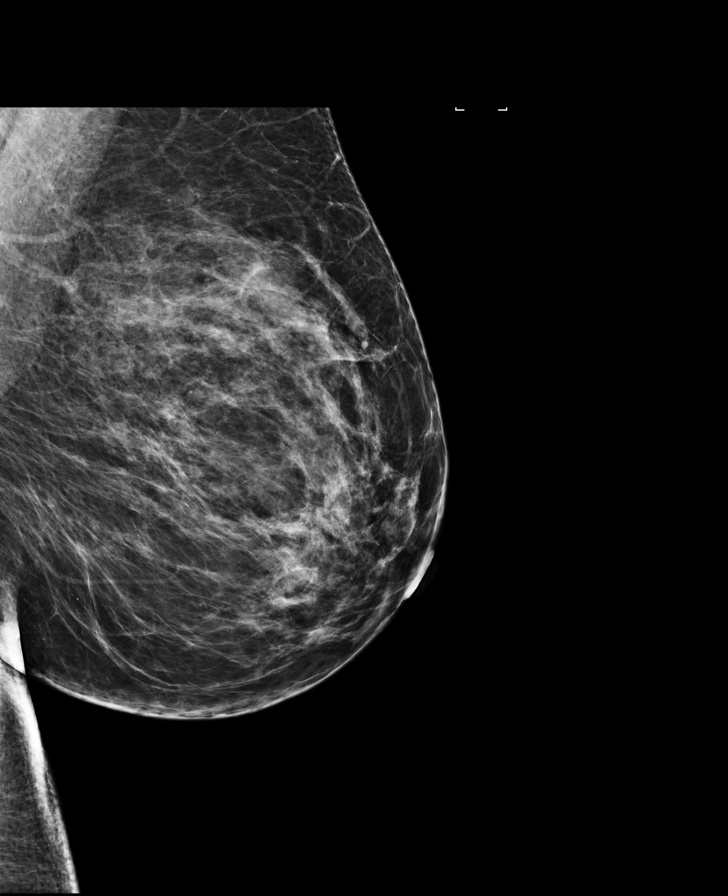

[L ML (2 of 2)]
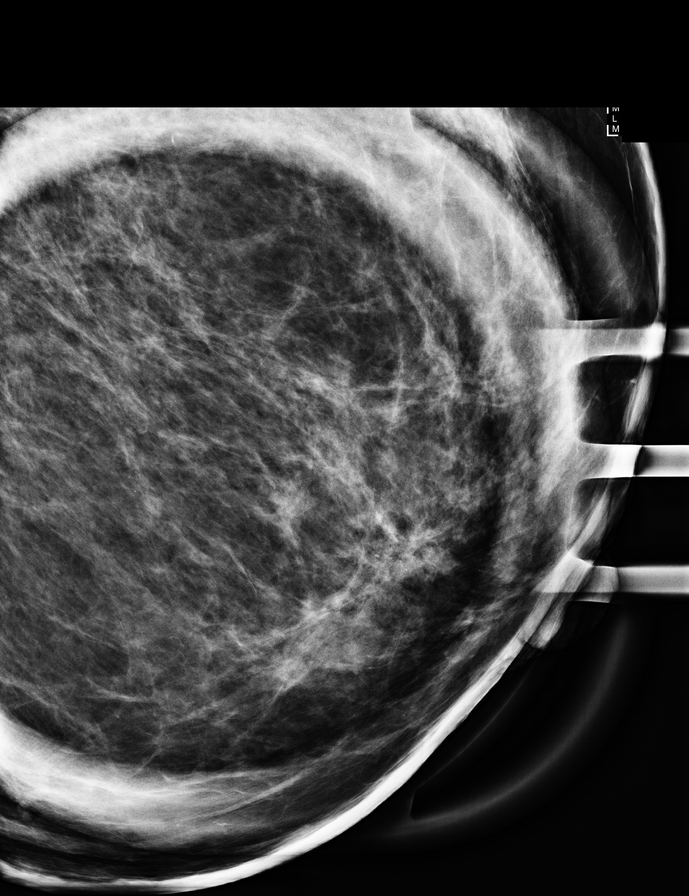

[L CC]
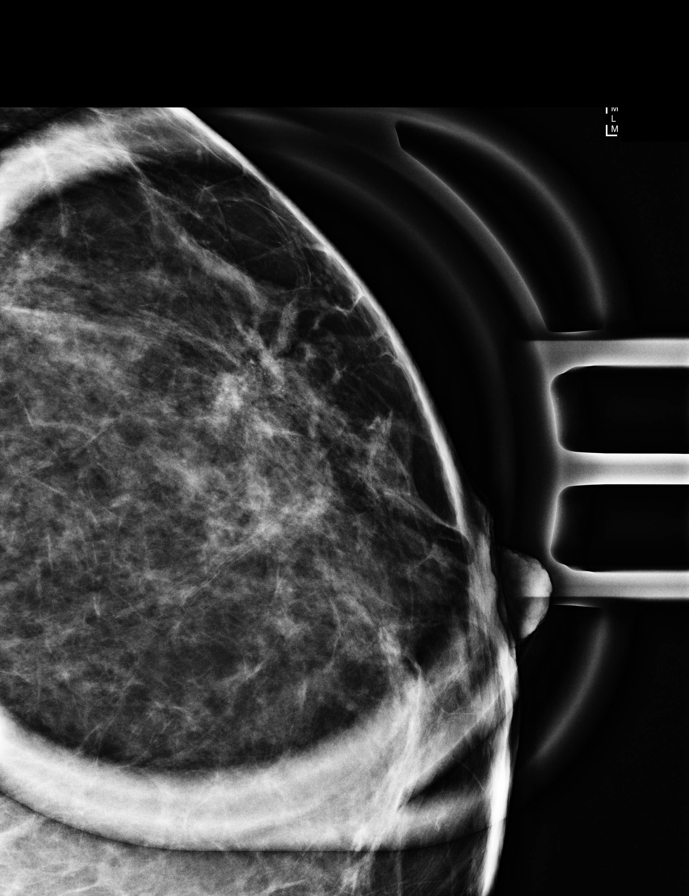

[R CC synth-2D]
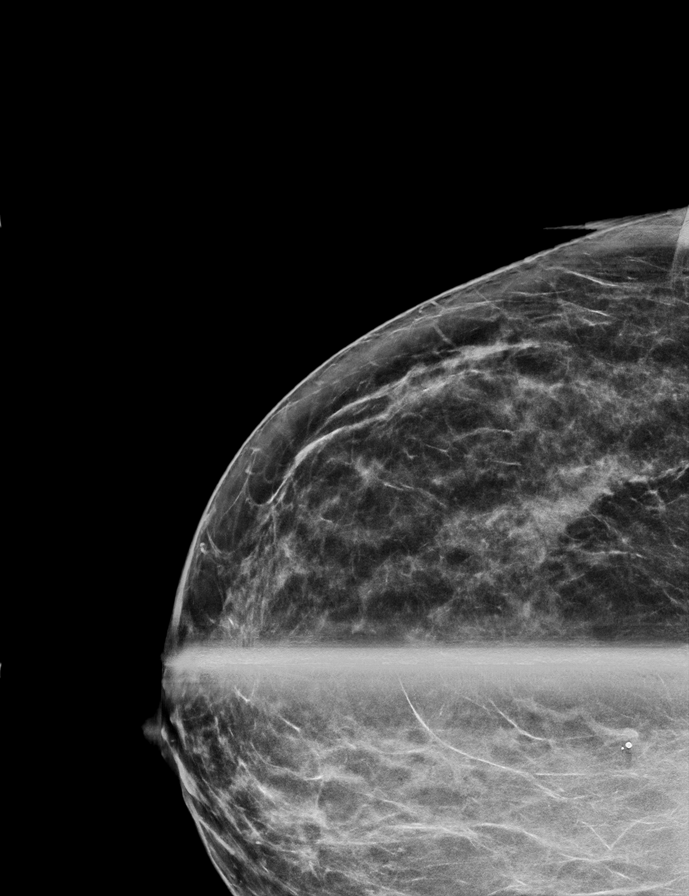

[R MLO synth-2D]
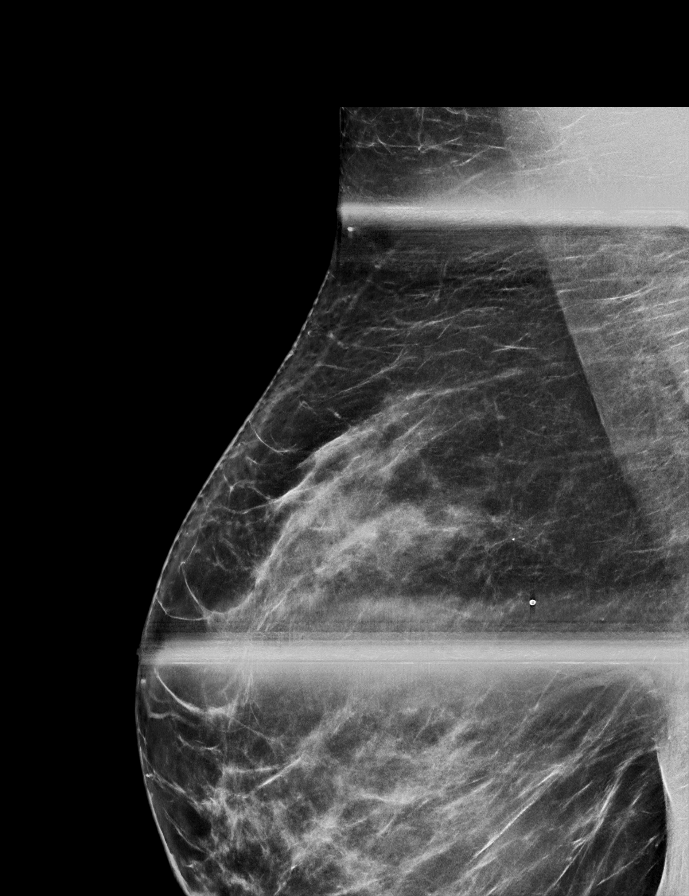

[L MLO synth-2D]
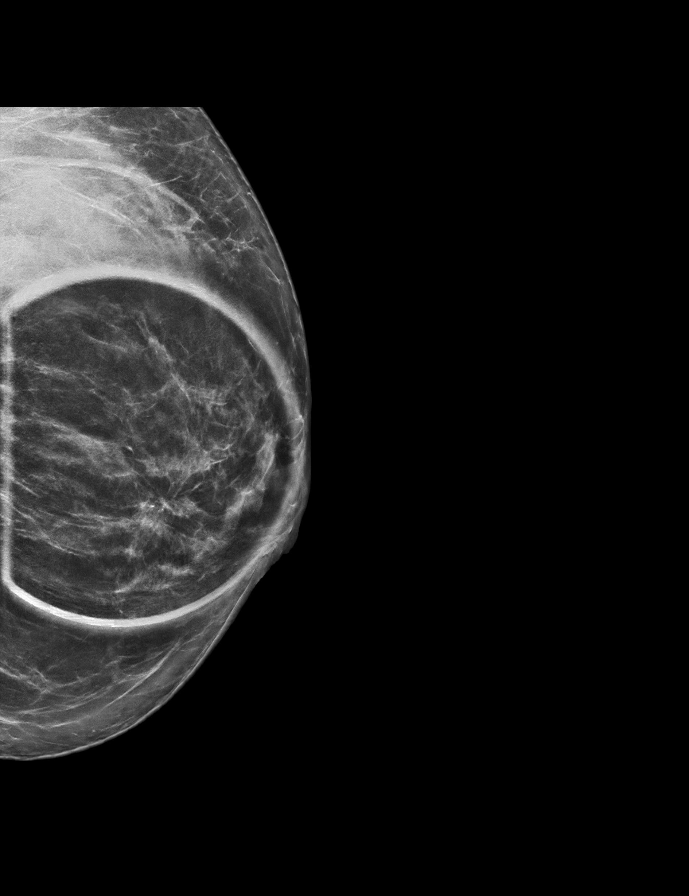

[L CC synth-2D]
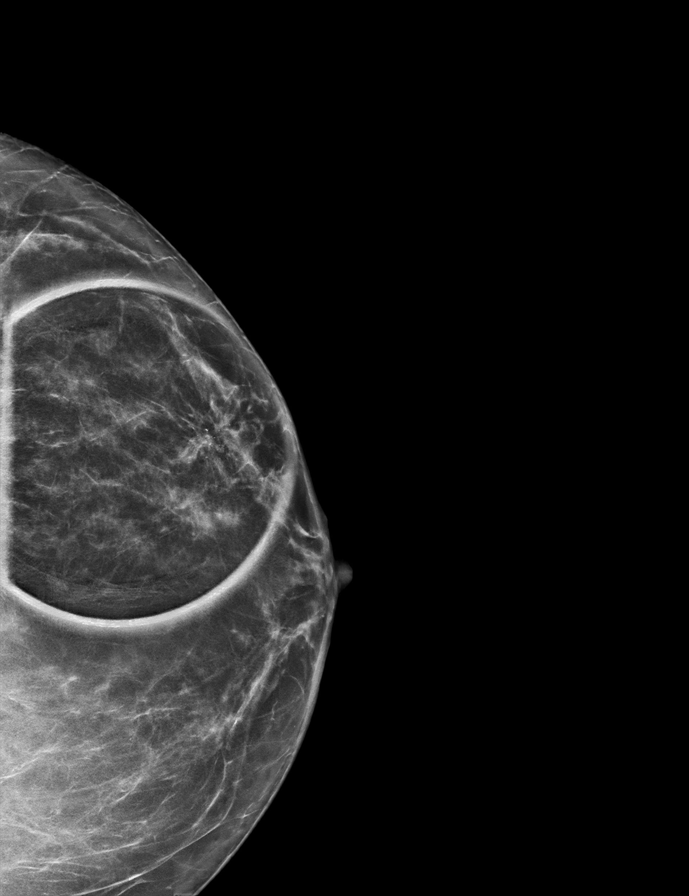

[L MLO tomo · tomo slice 39/78.0]
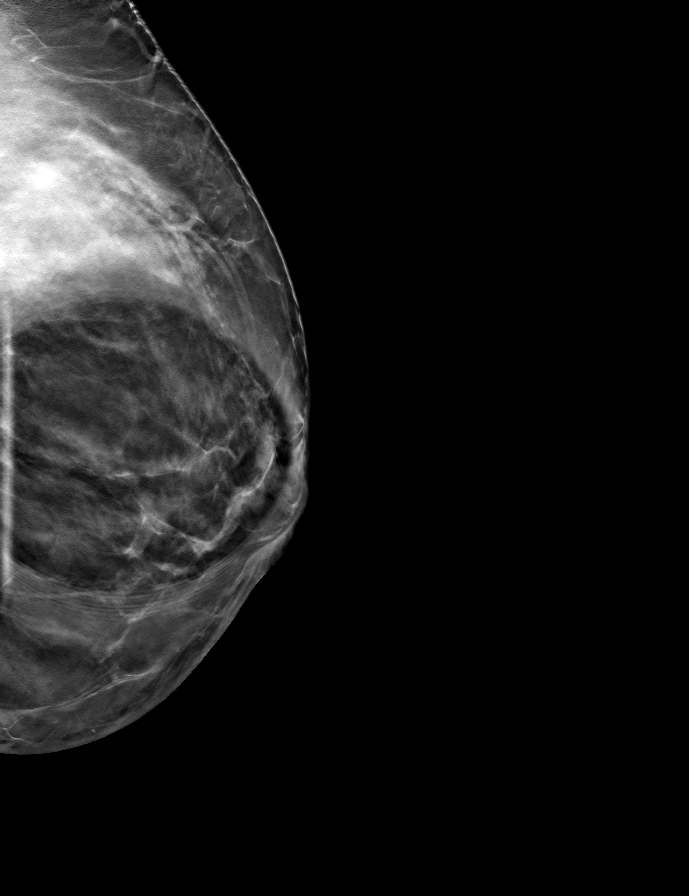

[8 of 27 positions shown; findings below may reference images not displayed]

ACR Breast Density Category b: There are scattered areas of
fibroglandular density.
FINDINGS: Left breast: The area of distortion persists on spot compression
imaging, associated with small irregular/pleomorphic calcifications,
and a possible underlying mass measuring approximately 7 mm. In the
left axilla, a single partly imaged lymph node appears thickened.

Right breast: The area asymmetry noted in the upper outer right
breast appears as fibroglandular tissue on the spot compression
tomosynthesis images. There is no underlying mass or discrete
asymmetry and there is no architectural distortion. There are no
suspicious calcifications.

Mammographic images were processed with CAD.

On physical exam, there is a possible small mass in the left breast
lateral to the nipple.

Targeted left breast ultrasound is performed, showing an irregular
hypoechoic mass at 3:30 o'clock, 3 cm the nipple, with several
associated echogenic foci reflecting calcifications. The mass
measures approximately 2.4 x 1.8 x 2.2 cm. In the left axilla, there
is a single abnormal lymph node with a maximal cortical thickness of
6 mm.

Targeted right breast ultrasound is performed, showing normal
fibroglandular tissue throughout the entire upper outer right
breast. No mass or suspicious lesion. No abnormal shadowing.
IMPRESSION: 1. 2.4 cm highly suspicious mass in the left breast at 3:30 o'clock,
3 cm from the nipple. Tissue sampling is recommended.
2. Single abnormal left axillary lymph node. Tissue sampling is
recommended.
3. No evidence of right breast malignancy.

RECOMMENDATION:
1. Ultrasound-guided core needle biopsy of the 3:30 o'clock position
left breast mass and the single abnormal left axillary lymph node.
This procedure was scheduled for [REDACTED], [DATE][REDACTED], [LV], and [DATE]
a.m.

I have discussed the findings and recommendations with the patient.
If applicable, a reminder letter will be sent to the patient
regarding the next appointment.

BI-RADS CATEGORY  5: Highly suggestive of malignancy.

## 2020-07-12 IMAGING — US US BREAST*R* LIMITED INC AXILLA
1 series · 4 of 4 positions shown · non-contrast
Comparison: Previous exam(s).

CLINICAL DATA: Screening recall for a possible distortion and
associated calcifications in the left breast. A possible abnormal
left axillary lymph node, and a more global asymmetry in the right
breast. Patient has had no previous surgery and is not on hormone
replacement therapy.

EXAM:
DIGITAL DIAGNOSTIC BILATERAL MAMMOGRAM WITH CAD AND TOMO
ULTRASOUND BILATERAL BREAST

[Series 1: us breast*right* limited inc axilla · 0.08mm/px · 4 of 4 slices shown]
[im 1/4]
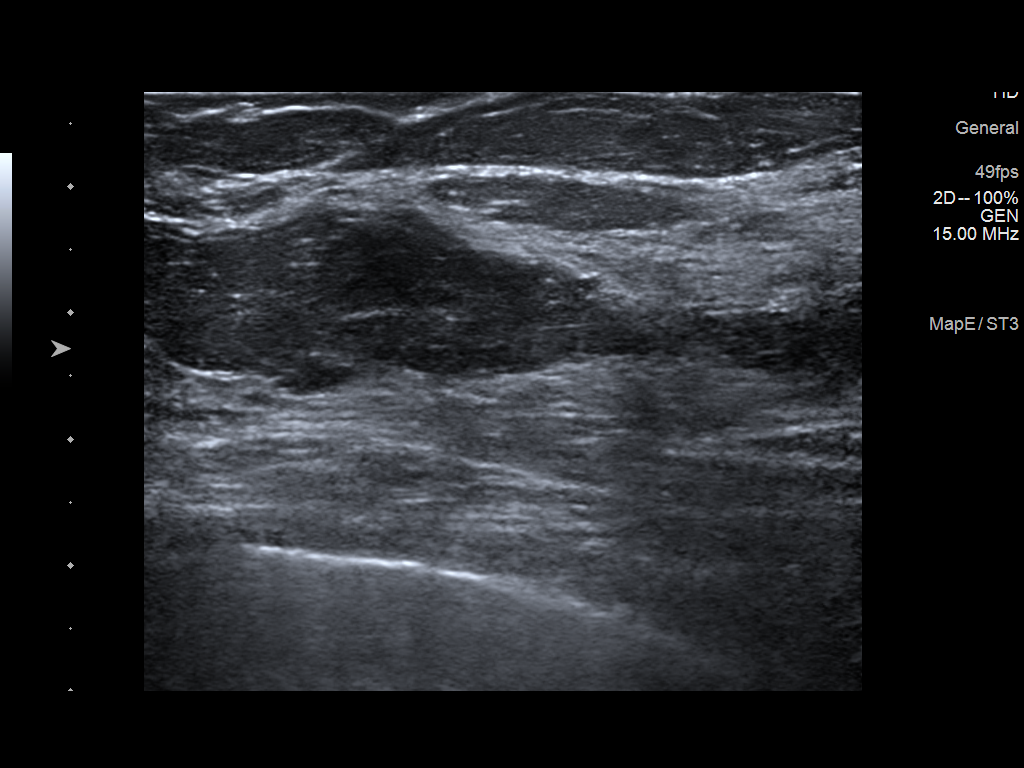
[im 2/4]
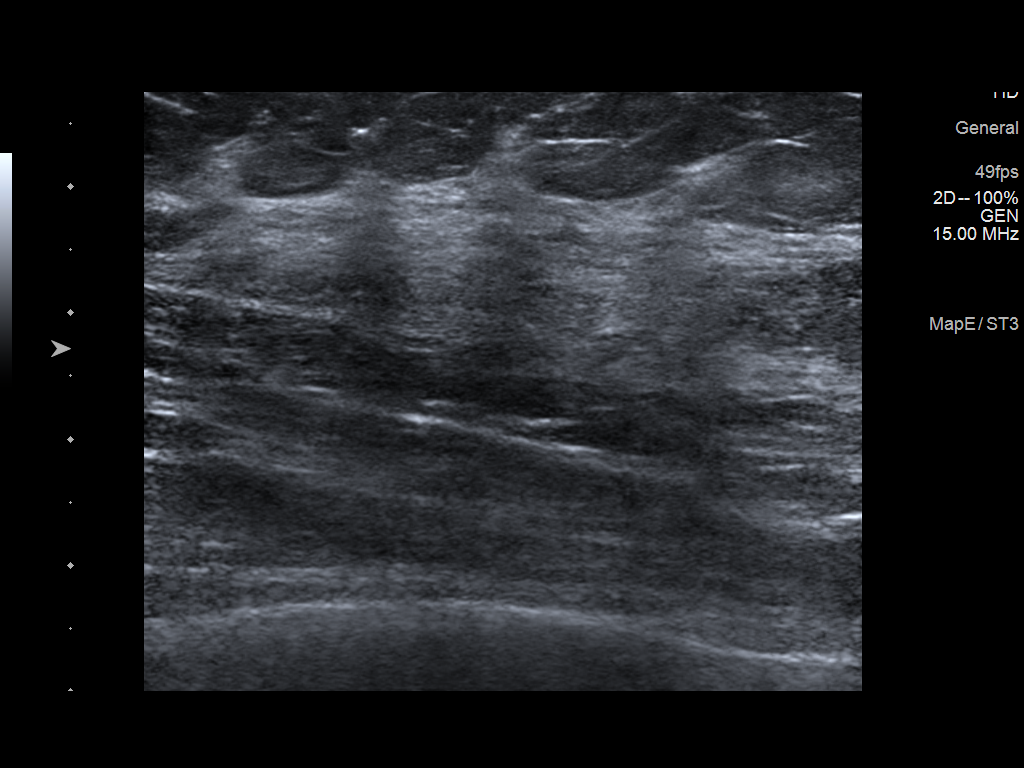
[im 3/4]
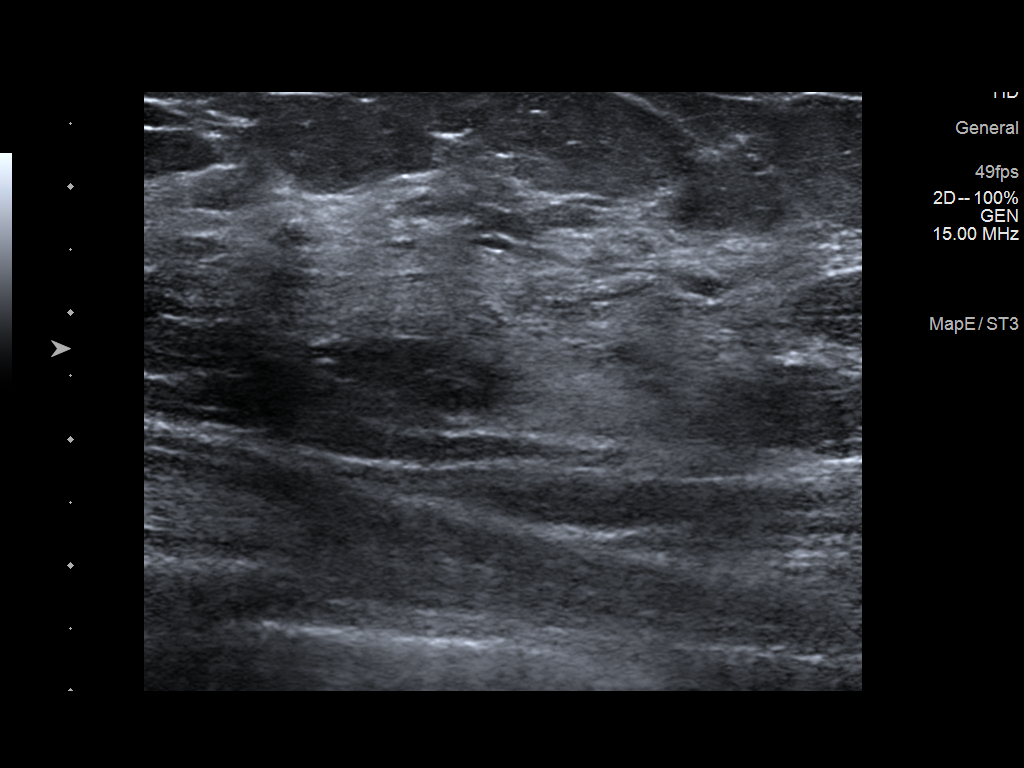
[im 4/4]
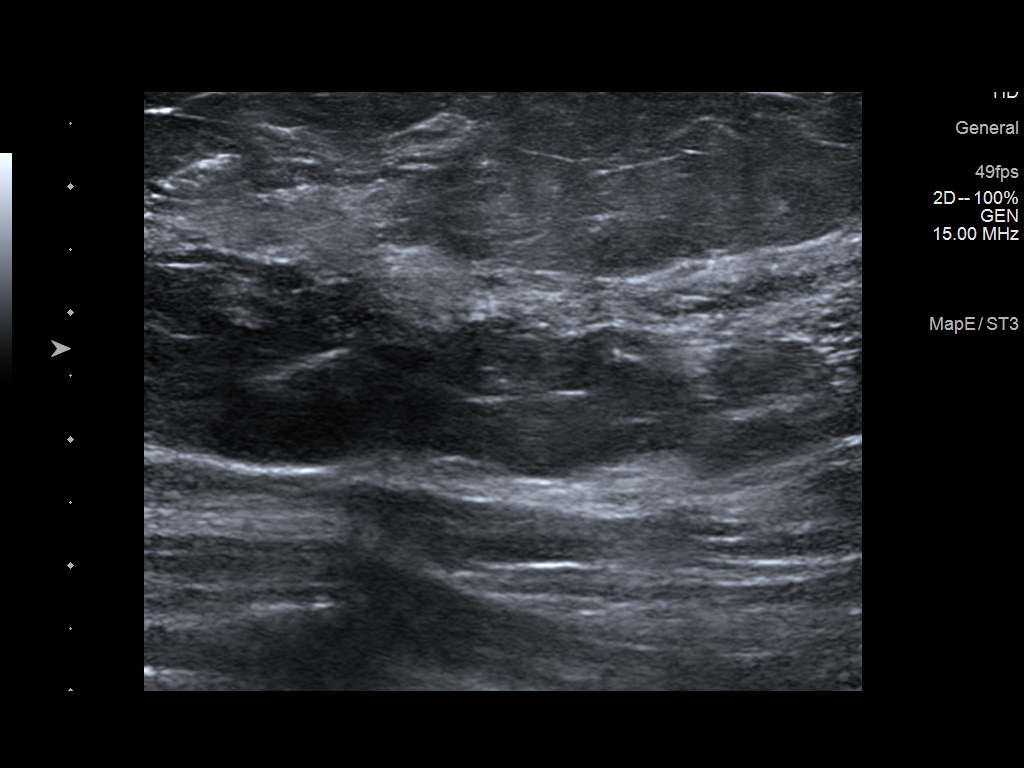

[4 of 4 positions shown; findings below may reference images not displayed]

ACR Breast Density Category b: There are scattered areas of
fibroglandular density.
FINDINGS: Left breast: The area of distortion persists on spot compression
imaging, associated with small irregular/pleomorphic calcifications,
and a possible underlying mass measuring approximately 7 mm. In the
left axilla, a single partly imaged lymph node appears thickened.

Right breast: The area asymmetry noted in the upper outer right
breast appears as fibroglandular tissue on the spot compression
tomosynthesis images. There is no underlying mass or discrete
asymmetry and there is no architectural distortion. There are no
suspicious calcifications.

Mammographic images were processed with CAD.

On physical exam, there is a possible small mass in the left breast
lateral to the nipple.

Targeted left breast ultrasound is performed, showing an irregular
hypoechoic mass at 3:30 o'clock, 3 cm the nipple, with several
associated echogenic foci reflecting calcifications. The mass
measures approximately 2.4 x 1.8 x 2.2 cm. In the left axilla, there
is a single abnormal lymph node with a maximal cortical thickness of
6 mm.

Targeted right breast ultrasound is performed, showing normal
fibroglandular tissue throughout the entire upper outer right
breast. No mass or suspicious lesion. No abnormal shadowing.
IMPRESSION: 1. 2.4 cm highly suspicious mass in the left breast at 3:30 o'clock,
3 cm from the nipple. Tissue sampling is recommended.
2. Single abnormal left axillary lymph node. Tissue sampling is
recommended.
3. No evidence of right breast malignancy.

RECOMMENDATION:
1. Ultrasound-guided core needle biopsy of the 3:30 o'clock position
left breast mass and the single abnormal left axillary lymph node.
This procedure was scheduled for [REDACTED], [DATE][REDACTED], [LV], and [DATE]
a.m.

I have discussed the findings and recommendations with the patient.
If applicable, a reminder letter will be sent to the patient
regarding the next appointment.

BI-RADS CATEGORY  5: Highly suggestive of malignancy.

## 2020-07-14 ENCOUNTER — Encounter: Payer: Self-pay | Admitting: Family

## 2020-07-14 ENCOUNTER — Other Ambulatory Visit: Payer: Self-pay

## 2020-07-14 ENCOUNTER — Ambulatory Visit (INDEPENDENT_AMBULATORY_CARE_PROVIDER_SITE_OTHER): Payer: 59 | Admitting: Family

## 2020-07-14 VITALS — BP 140/90 | HR 102 | Temp 98.5°F | Ht 66.0 in | Wt 225.0 lb

## 2020-07-14 DIAGNOSIS — F418 Other specified anxiety disorders: Secondary | ICD-10-CM

## 2020-07-14 DIAGNOSIS — F4322 Adjustment disorder with anxiety: Secondary | ICD-10-CM | POA: Diagnosis not present

## 2020-07-14 MED ORDER — ALPRAZOLAM 0.25 MG PO TABS: 0.2500 mg | ORAL_TABLET | Freq: Two times a day (BID) | ORAL | 0 refills | Status: DC | PRN

## 2020-07-14 NOTE — Progress Notes (Signed)
  Lindsay Hampton is a 54 y.o. female with the following history as recorded in EpicCare:  There are no problems to display for this patient.   Current Outpatient Medications  Medication Sig Dispense Refill  . ALPRAZolam (XANAX) 0.25 MG tablet Take 1 tablet (0.25 mg total) by mouth 2 (two) times daily as needed for anxiety. 30 tablet 0  . fluticasone (FLONASE) 50 MCG/ACT nasal spray Place 2 sprays into both nostrils daily. 16 g 6  . olopatadine (PATADAY) 0.1 % ophthalmic solution Place 1 drop into both eyes 2 (two) times daily. 5 mL 3  . Vitamin D, Ergocalciferol, (DRISDOL) 1.25 MG (50000 UNIT) CAPS capsule Take 50,000 Units by mouth every 7 (seven) days.     No current facility-administered medications for this visit.    Allergies: Patient has no known allergies.  Past Medical History:  Diagnosis Date  . Allergy    seasonal    Past Surgical History:  Procedure Laterality Date  . CERVICAL BIOPSY  W/ LOOP ELECTRODE EXCISION  12/2017  . CESAREAN SECTION    . DILATION AND CURETTAGE OF UTERUS      Family History  Problem Relation Age of Onset  . Breast cancer Mother        in her early 11's, now in her 37's  . Prostate cancer Father   . Colon cancer Maternal Grandmother 54  . Breast cancer Cousin        in 61's  . Esophageal cancer Maternal Grandfather   . Rectal cancer Neg Hx   . Stomach cancer Neg Hx     Social History   Tobacco Use  . Smoking status: Never Smoker  . Smokeless tobacco: Never Used  Substance Use Topics  . Alcohol use: Yes    Alcohol/week: 4.0 - 5.0 standard drinks    Types: 4 - 5 Standard drinks or equivalent per week    Subjective:  Patient is being seen for first in person visit since establishing in 2020; will be requiring a breast biopsy on Monday for abnormality noted on diagnostic mammogram this week; requesting refill on her Xanax to help with situational anxiety until results are available;   Objective:  Vitals:   07/14/20 1033  BP:  140/90  Pulse: (!) 102  Temp: 98.5 F (36.9 C)  TempSrc: Oral  SpO2: 98%  Weight: 225 lb (102.1 kg)  Height: 5\' 6"  (1.676 m)    General: Well developed, well nourished, in no acute distress  Head: Normocephalic and atraumatic  Lungs: Respirations unlabored;  Neurologic: Alert and oriented; speech intact; face symmetrical; moves all extremities well; CNII-XII intact without focal deficit   Assessment:  1. Situational anxiety   2. Adjustment disorder with anxious mood     Plan:  Agree that refill on Xanax bid prn is appropriate; reviewed recent mammogram results with her and agree that if this is cancer, it appears early and treatable; she understands to reach out with concerns/ questions as needed.   No follow-ups on file.  No orders of the defined types were placed in this encounter.   Requested Prescriptions   Signed Prescriptions Disp Refills  . ALPRAZolam (XANAX) 0.25 MG tablet 30 tablet 0    Sig: Take 1 tablet (0.25 mg total) by mouth 2 (two) times daily as needed for anxiety.

## 2020-07-17 ENCOUNTER — Ambulatory Visit
Admission: RE | Admit: 2020-07-17 | Discharge: 2020-07-17 | Disposition: A | Discharge status: Home / Self Care | Payer: 59 | Source: Ambulatory Visit | Attending: Obstetrics and Gynecology | Admitting: Obstetrics and Gynecology

## 2020-07-17 ENCOUNTER — Other Ambulatory Visit: Payer: Self-pay

## 2020-07-17 ENCOUNTER — Telehealth: Payer: Self-pay | Admitting: Obstetrics and Gynecology

## 2020-07-17 DIAGNOSIS — R599 Enlarged lymph nodes, unspecified: Secondary | ICD-10-CM

## 2020-07-17 DIAGNOSIS — N6320 Unspecified lump in the left breast, unspecified quadrant: Secondary | ICD-10-CM

## 2020-07-17 IMAGING — MG MM BREAST LOCALIZATION CLIP
4 series · 4 of 12 positions shown · non-contrast
Comparison: Previous exam(s).

CLINICAL DATA: 54-year-old female presenting for biopsy of a left
breast mass as well as left axillary lymph node.

EXAM:
DIAGNOSTIC LEFT MAMMOGRAM POST ULTRASOUND BIOPSY

[L XCCL synth-2D]
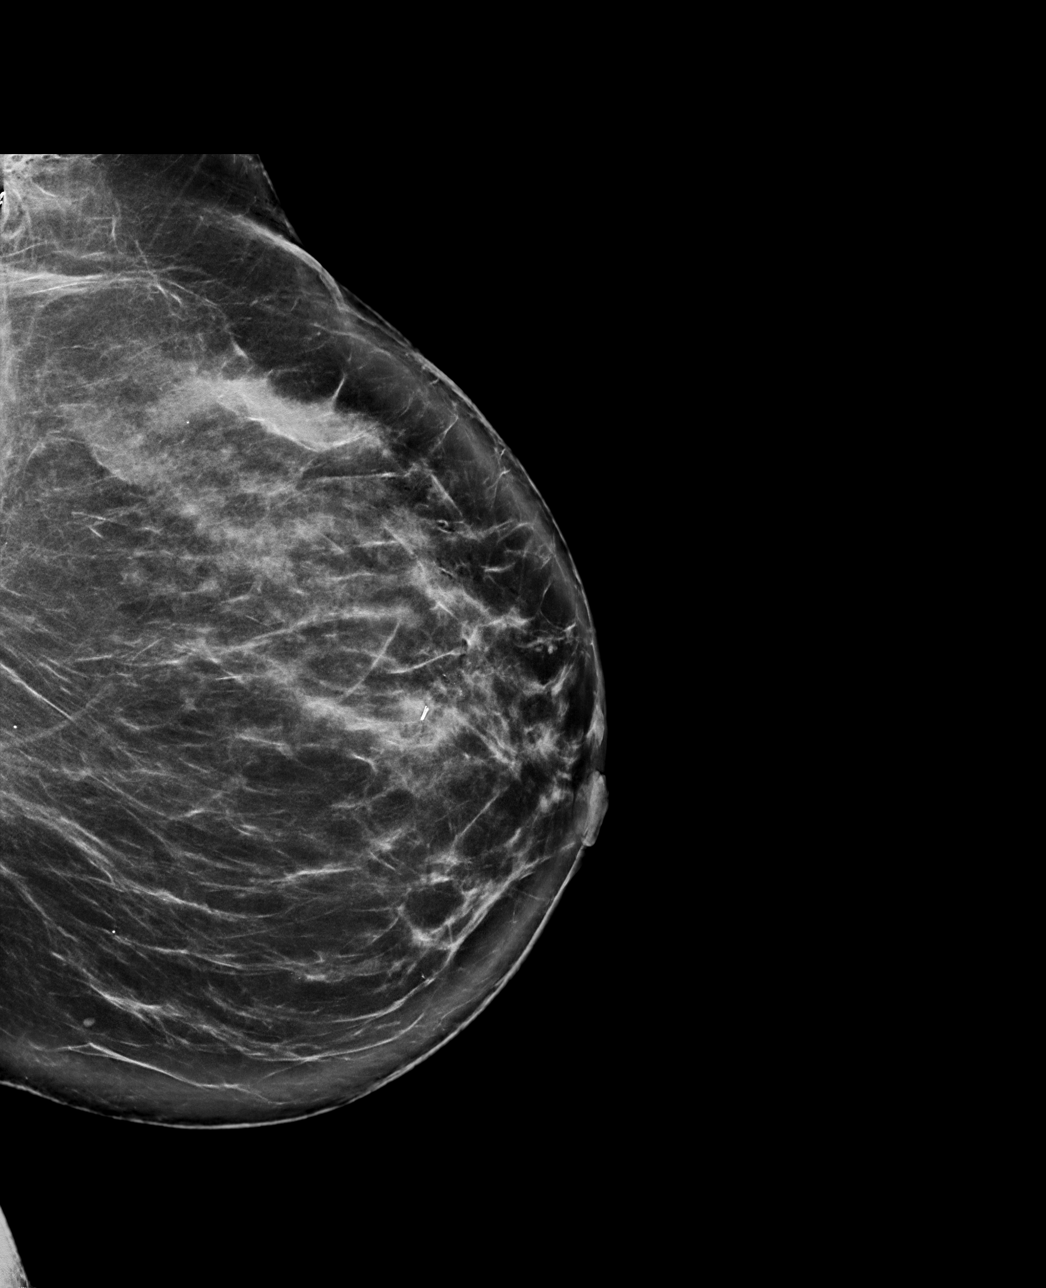

[L LM synth-2D]
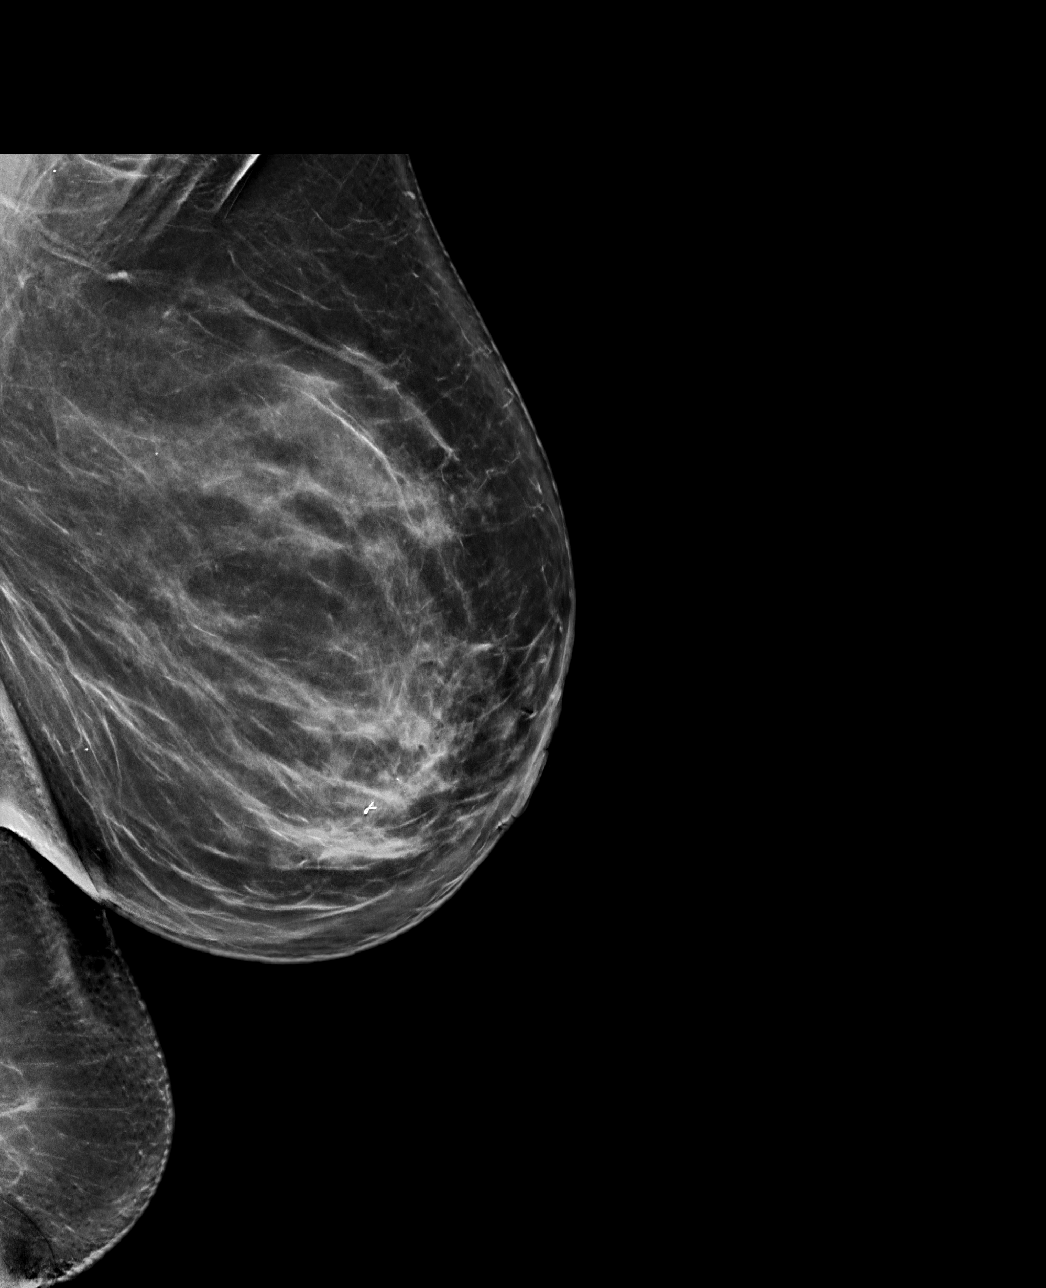

[L LM tomo · tomo slice 49/98.0]
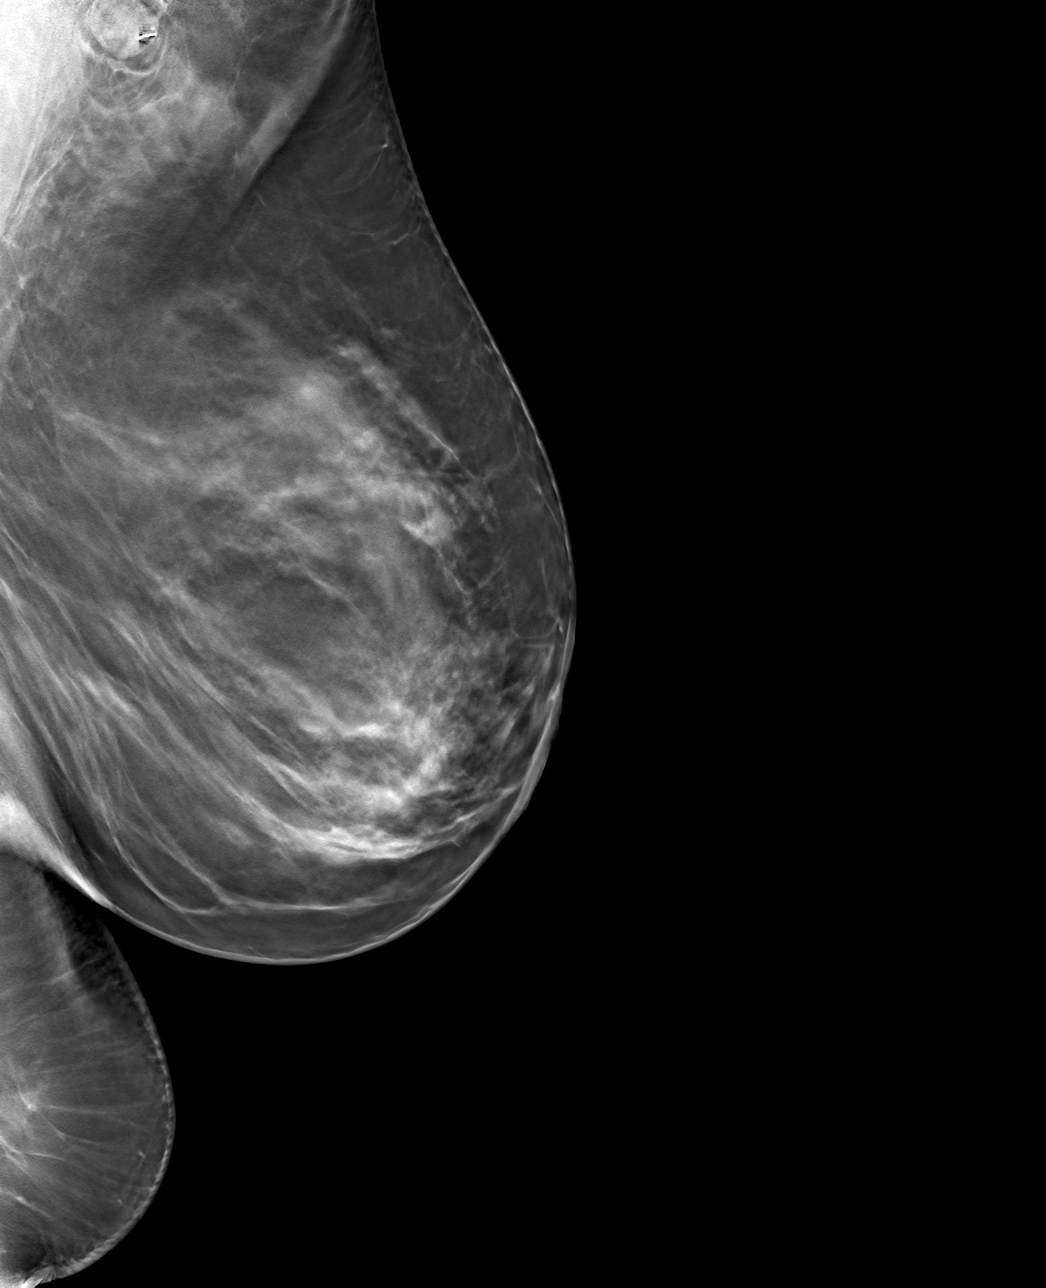

[L XCCL tomo · tomo slice 45/90.0]
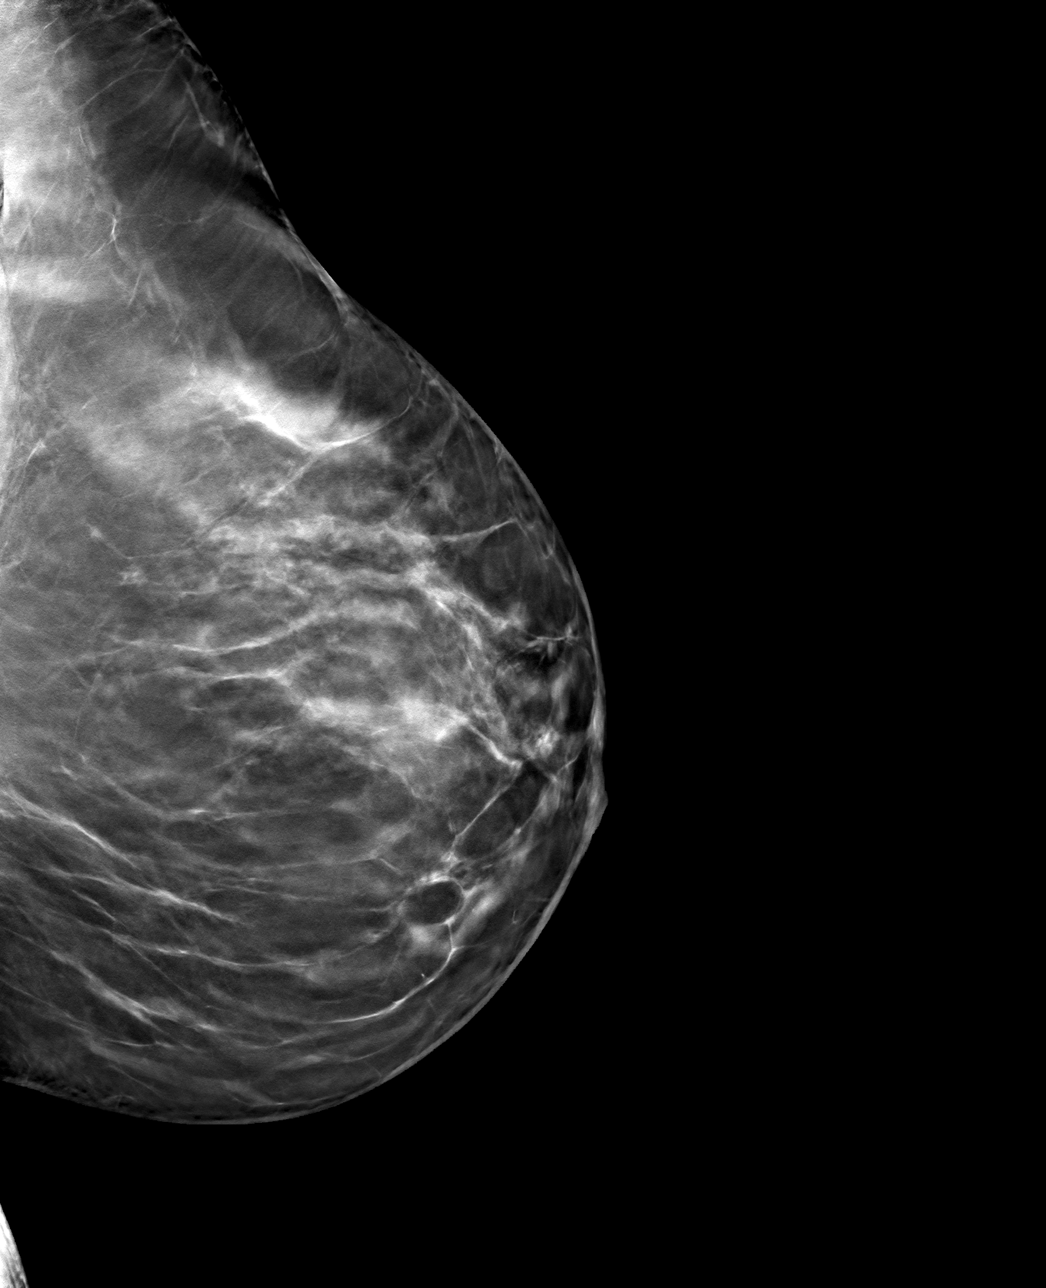

[4 of 12 positions shown; findings below may reference images not displayed]

FINDINGS: Mammographic images were obtained following ultrasound guided biopsy
of a left breast mass at 3:30 o'clock. The ribbon biopsy marking
clip is in expected position at the site of biopsy.

Mammographic images were obtained following ultrasound guided biopsy
of a left axillary lymph node. The HydroMARK biopsy marking clip is
in expected position at the site of biopsy.
IMPRESSION: Appropriate positioning of the ribbon shaped biopsy marking clip at
the site of biopsy in the left breast at 3:30 o'clock.

Appropriate positioning of the HydroMARK shaped biopsy marking clip
at the site of biopsy in the left axilla.

Final Assessment: Post Procedure Mammograms for Marker Placement

## 2020-07-17 NOTE — Telephone Encounter (Signed)
Patient "just found out that she has breast cancer and would like to talk with Dr.Jertson's nurse". She said "the report is to be sent over to Dr.Jertson".

## 2020-07-17 NOTE — Telephone Encounter (Signed)
Spoke with pt. Pt calling to give update to Dr Talbert Nan. Pt states was diagnosed with left breast cancer and had biopsy and clip placed this morning. Pt wanting to let Dr Talbert Nan know about reports coming to her tomorrow. Pt is upset and wanting to know if needs OV to discuss anything with Dr Talbert Nan?   Routing to Dr Talbert Nan

## 2020-07-17 NOTE — Telephone Encounter (Signed)
Called patient, left a message that I was returning her call.

## 2020-07-18 NOTE — Telephone Encounter (Signed)
Patient returning call to Dr Talbert Nan.

## 2020-07-19 NOTE — Telephone Encounter (Signed)
Spoke with the patient, discussed her recent diagnosis of breast cancer. Discussed +estrogen and +progesterone receptors. She is meeting with the Oncology team next Wednesday, primary gave her some Xanax.

## 2020-07-20 ENCOUNTER — Encounter: Payer: Self-pay | Admitting: *Deleted

## 2020-07-20 DIAGNOSIS — C50512 Malignant neoplasm of lower-outer quadrant of left female breast: Secondary | ICD-10-CM | POA: Insufficient documentation

## 2020-07-20 DIAGNOSIS — Z170 Estrogen receptor positive status [ER+]: Secondary | ICD-10-CM | POA: Insufficient documentation

## 2020-07-24 ENCOUNTER — Other Ambulatory Visit: Payer: 59

## 2020-07-25 NOTE — Progress Notes (Signed)
[place] NOTE  Patient Care Team: [name], Banks as PCP - General (Internal Medicine) [name], RN as Oncology Nurse Navigator [name], RN as Oncology Nurse Navigator [name], MD as Consulting Physician (General Surgery) [name], MD as Consulting Physician (Hematology and Oncology) Kyung Rudd, MD as Consulting Physician (Radiation Oncology)  CHIEF COMPLAINTS/PURPOSE OF CONSULTATION:  Newly diagnosed breast cancer  HISTORY OF PRESENTING ILLNESS:  Lindsay Hampton 54 y.o. female is here because of recent diagnosis of invasive mammary carcinoma of the left breast. Screening mammogram on 07/06/20 detected left breast calcifications, enlarged left axillary lymph nodes, and an asymmetry in the right breast. Diagnostic mammogram and [place] on 07/12/20 showed in the left breast, a 2.4cm mass with calcifications at the 3:30 position, a left axillary lymph node with cortical thickness, and in the right breast, no suspicious masses or calcifications. Left breast biopsy on 07/18/20 showed invasive and in situ mammary carcinoma in the breast and axilla, grade 2, HER-2 equivocal by IHC (2+), negative by FISH, ER+ 95%, PR+40%, Ki67 15%. She presents to the clinic today for initial evaluation and discussion of treatment options.   I reviewed her records extensively and collaborated the history with the patient.  SUMMARY OF ONCOLOGIC HISTORY: Oncology History  Malignant neoplasm of lower-outer quadrant of left breast of female, estrogen receptor positive ([place])  07/20/2020 Initial Diagnosis   Screening mammogram detected left breast calcifications, enlarged left axillary lymph nodes, and an asymmetry in the right breast. Diagnostic mammogram showed in the left breast, a 2.4cm mass with calcifications, 3:30 position, a left axillary lymph node with cortical thickness, and in the right breast, no suspicious masses or calcifications. Left breast  biopsy showed invasive and in situ mammary carcinoma in the breast and axilla, grade 2, HER-2 equivocal by IHC (2+), negative by FISH, ER+ 95%, PR+40%, Ki67 15%.    07/26/2020 Cancer Staging   Staging form: Breast, AJCC 8th Edition - Clinical stage from 07/26/2020: Stage IIA (cT2, cN1(f), cM0, G2, ER+, PR+, HER2-) - Signed by [name], MD on 07/26/2020      MEDICAL HISTORY:  Past Medical History:  Diagnosis Date  . Allergy    seasonal  . Breast cancer ([place])     SURGICAL HISTORY: Past Surgical History:  Procedure Laterality Date  . CERVICAL BIOPSY  W/ LOOP ELECTRODE EXCISION  12/2017  . CESAREAN SECTION    . DILATION AND CURETTAGE OF UTERUS      SOCIAL HISTORY: Social History   Socioeconomic History  . Marital status: Married    Spouse name: Not on file  . Number of children: Not on file  . Years of education: Not on file  . Highest education level: Not on file  Occupational History  . Not on file  Tobacco Use  . Smoking status: Never Smoker  . Smokeless tobacco: Never Used  Vaping Use  . Vaping Use: Never used  Substance and Sexual Activity  . Alcohol use: Yes    Alcohol/week: 4.0 - 5.0 standard drinks    Types: 4 - 5 Standard drinks or equivalent per week  . Drug use: No  . Sexual activity: Not Currently    Partners: Male    Birth control/protection: None  Other Topics Concern  . Not on file  Social History Narrative  . Not on file   Social Determinants of Health   Financial Resource Strain:   . Difficulty of Paying Living Expenses: Not on file  Food Insecurity:   . Worried About Charity fundraiser in the Last Year: Not on file  . Ran Out of Food in the Last Year: Not on file  Transportation Needs:   . Lack of Transportation (Medical): Not on file  . Lack of Transportation (Non-Medical): Not on file  Physical Activity:   . Days of Exercise per Week: Not on file  . Minutes of Exercise per Session: Not on file  Stress:   . Feeling of Stress : Not on  file  Social Connections:   . Frequency of Communication with Friends and Family: Not on file  . Frequency of Social Gatherings with Friends and Family: Not on file  . Attends Religious Services: Not on file  . Active Member of Clubs or Organizations: Not on file  . Attends Archivist Meetings: Not on file  . Marital Status: Not on file  Intimate Partner Violence:   . Fear of Current or Ex-Partner: Not on file  . Emotionally Abused: Not on file  . Physically Abused: Not on file  . Sexually Abused: Not on file    FAMILY HISTORY: Family History  Problem Relation Age of Onset  . Breast cancer Mother        in her early 57's, now in her 44's  . Prostate cancer Father   . Colon cancer Maternal Grandmother 30  . Breast cancer Cousin        in 70's  . Esophageal cancer Maternal Grandfather   . Rectal cancer Neg Hx   . Stomach cancer Neg Hx     ALLERGIES:  has No Known Allergies.  MEDICATIONS:  Current Outpatient Medications  Medication Sig Dispense Refill  . ALPRAZolam (XANAX) 0.25 MG tablet Take 1 tablet (0.25 mg total) by mouth 2 (two) times daily as needed for anxiety. 30 tablet 0  . fluticasone (FLONASE) 50 MCG/ACT nasal spray Place 2 sprays into both nostrils daily. 16 g 6  . olopatadine (PATADAY) 0.1 % ophthalmic solution Place 1 drop into both eyes 2 (two) times daily. 5 mL 3  . Vitamin D, Ergocalciferol, (DRISDOL) 1.25 MG (50000 UNIT) CAPS capsule Take 50,000 Units by mouth every 7 (seven) days.    . tamoxifen (NOLVADEX) 20 MG tablet Take 1 tablet (20 mg total) by mouth daily. 30 tablet 1   No current facility-administered medications for this visit.    REVIEW OF SYSTEMS:   All other systems were reviewed with the patient and are negative.  PHYSICAL EXAMINATION: ECOG PERFORMANCE STATUS: 1 - Symptomatic but completely ambulatory  Vitals:   07/26/20 0853  BP: (!) 125/91  Pulse: (!) 102  Resp: 18  Temp: 99 F (37.2 C)  SpO2: 100%   Filed Weights    07/26/20 0853  Weight: 219 lb 6.4 oz (99.5 kg)     LABORATORY DATA:  I have reviewed the data as listed Lab Results  Component Value Date   WBC 9.9 07/26/2020   HGB 14.2 07/26/2020   HCT 42.3 07/26/2020   MCV 92.4 07/26/2020   PLT 375 07/26/2020   Lab Results  Component Value Date   NA 137 07/26/2020   K 3.8 07/26/2020   CL 101 07/26/2020   CO2 26 07/26/2020    RADIOGRAPHIC STUDIES: I have personally reviewed the radiological reports and agreed with the findings in the report.  ASSESSMENT AND PLAN:  Malignant neoplasm of lower-outer quadrant of left breast of female, estrogen receptor positive ([place]) Screening mammogram detected left breast calcifications,  enlarged left axillary lymph node, and an asymmetry in the right breast. Diagnostic mammogram showed in the left breast, a 2.4cm mass with calcifications, 3:30 position, a left axillary lymph node with cortical thickness, Left breast biopsy showed IDC with DCIS, grade 2, HER-2 equivocal by IHC (2+), negative by FISH, ER+ 95%, PR+40%, Ki67 15%.  T2 N1 M0 stage IIa  Pathology and radiology counseling:Discussed with the patient, the details of pathology including the type of breast cancer,the clinical staging, the significance of ER, PR and HER-2/neu receptors and the implications for treatment. After reviewing the pathology in detail, we proceeded to discuss the different treatment options between surgery, radiation, chemotherapy, antiestrogen therapies.  Recommendations: Breast MRI will be performed to see the size of the tumor as well as how many lymph nodes are involved. 1. Breast conserving surgery with targeted node dissection followed by 2. MammaPrint testing to determine if chemotherapy would be of any benefit followed by 3. Adjuvant radiation therapy followed by 4. Adjuvant antiestrogen therapy  If there is size of the tumor in the breast or the lymph nodes is high and she needs neoadjuvant therapy then we will obtain  MammaPrint to guide her decision regarding chemo versus antiestrogen therapy.  I started the patient on tamoxifen because she is still premenopausal.  Tamoxifen counseling: We discussed the risks and benefits of tamoxifen. These include but not limited to insomnia, hot flashes, mood changes, vaginal dryness, and weight gain. Although rare, serious side effects including endometrial cancer, risk of blood clots were also discussed. We strongly believe that the benefits far outweigh the risks. Patient understands these risks and consented to starting treatment. Planned treatment duration is 10 years.  Severe anxiety: Currently on Xanax.  Return to clinic after surgery to discuss final pathology report and then determine if MammaPrint testing will need to be sent.  All questions were answered. The patient knows to call the clinic with any problems, questions or concerns.   Rulon Eisenmenger, MD, MPH 07/26/2020    I, Molly Dorshimer, am acting as scribe for [name], MD.  I have reviewed the above documentation for accuracy and completeness, and I agree with the above.

## 2020-07-26 ENCOUNTER — Inpatient Hospital Stay: Payer: 59 | Admitting: Licensed Clinical Social Worker

## 2020-07-26 ENCOUNTER — Encounter: Payer: Self-pay | Admitting: Physical Therapy

## 2020-07-26 ENCOUNTER — Other Ambulatory Visit: Payer: Self-pay

## 2020-07-26 ENCOUNTER — Encounter: Payer: Self-pay | Admitting: Hematology and Oncology

## 2020-07-26 ENCOUNTER — Inpatient Hospital Stay: Payer: 59 | Attending: Hematology and Oncology | Admitting: Hematology and Oncology

## 2020-07-26 ENCOUNTER — Ambulatory Visit
Admission: RE | Admit: 2020-07-26 | Discharge: 2020-07-26 | Disposition: A | Discharge status: Home / Self Care | Payer: 59 | Source: Ambulatory Visit | Attending: Radiation Oncology | Admitting: Radiation Oncology

## 2020-07-26 ENCOUNTER — Ambulatory Visit (HOSPITAL_BASED_OUTPATIENT_CLINIC_OR_DEPARTMENT_OTHER): Payer: 59 | Admitting: Genetic Counselor

## 2020-07-26 ENCOUNTER — Encounter: Payer: Self-pay | Admitting: *Deleted

## 2020-07-26 ENCOUNTER — Ambulatory Visit: Payer: 59 | Attending: Surgery | Admitting: Physical Therapy

## 2020-07-26 ENCOUNTER — Inpatient Hospital Stay: Payer: 59

## 2020-07-26 ENCOUNTER — Encounter: Payer: Self-pay | Admitting: Genetic Counselor

## 2020-07-26 VITALS — BP 125/91 | HR 102 | Temp 99.0°F | Resp 18 | Ht 66.5 in | Wt 219.4 lb

## 2020-07-26 DIAGNOSIS — Z170 Estrogen receptor positive status [ER+]: Secondary | ICD-10-CM | POA: Diagnosis present

## 2020-07-26 DIAGNOSIS — R590 Localized enlarged lymph nodes: Secondary | ICD-10-CM | POA: Insufficient documentation

## 2020-07-26 DIAGNOSIS — F4322 Adjustment disorder with anxiety: Secondary | ICD-10-CM

## 2020-07-26 DIAGNOSIS — C50512 Malignant neoplasm of lower-outer quadrant of left female breast: Secondary | ICD-10-CM | POA: Insufficient documentation

## 2020-07-26 DIAGNOSIS — Z800 Family history of malignant neoplasm of digestive organs: Secondary | ICD-10-CM

## 2020-07-26 DIAGNOSIS — Z79899 Other long term (current) drug therapy: Secondary | ICD-10-CM | POA: Diagnosis not present

## 2020-07-26 DIAGNOSIS — Z79810 Long term (current) use of selective estrogen receptor modulators (SERMs): Secondary | ICD-10-CM | POA: Diagnosis not present

## 2020-07-26 DIAGNOSIS — R293 Abnormal posture: Secondary | ICD-10-CM | POA: Diagnosis present

## 2020-07-26 DIAGNOSIS — Z803 Family history of malignant neoplasm of breast: Secondary | ICD-10-CM

## 2020-07-26 DIAGNOSIS — Z8042 Family history of malignant neoplasm of prostate: Secondary | ICD-10-CM | POA: Diagnosis not present

## 2020-07-26 DIAGNOSIS — F419 Anxiety disorder, unspecified: Secondary | ICD-10-CM | POA: Insufficient documentation

## 2020-07-26 HISTORY — DX: Family history of malignant neoplasm of prostate: Z80.42

## 2020-07-26 HISTORY — DX: Family history of malignant neoplasm of digestive organs: Z80.0

## 2020-07-26 HISTORY — PX: BREAST LUMPECTOMY: SHX2

## 2020-07-26 HISTORY — DX: Family history of malignant neoplasm of breast: Z80.3

## 2020-07-26 LAB — CMP (CANCER CENTER ONLY)
ALT: 68 U/L — ABNORMAL HIGH (ref 0–44)
AST: 31 U/L (ref 15–41)
Albumin: 4.4 g/dL (ref 3.5–5.0)
Alkaline Phosphatase: 55 U/L (ref 38–126)
Anion gap: 10 (ref 5–15)
BUN: 11 mg/dL (ref 6–20)
CO2: 26 mmol/L (ref 22–32)
Calcium: 11 mg/dL — ABNORMAL HIGH (ref 8.9–10.3)
Chloride: 101 mmol/L (ref 98–111)
Creatinine: 0.89 mg/dL (ref 0.44–1.00)
GFR, Est AFR Am: >60 mL/min (ref >60)
GFR, Estimated: >60 mL/min (ref >60)
Glucose, Bld: 129 mg/dL — ABNORMAL HIGH (ref 70–99)
Potassium: 3.8 mmol/L (ref 3.5–5.1)
Sodium: 137 mmol/L (ref 135–145)
Total Bilirubin: 0.9 mg/dL (ref 0.3–1.2)
Total Protein: 7.9 g/dL (ref 6.5–8.1)

## 2020-07-26 LAB — CBC WITH DIFFERENTIAL (CANCER CENTER ONLY)
Abs Immature Granulocytes: 0.04 10*3/uL (ref 0.00–0.07)
Basophils Absolute: 0.1 10*3/uL (ref 0.0–0.1)
Basophils Relative: 1 %
Eosinophils Absolute: 0.2 10*3/uL (ref 0.0–0.5)
Eosinophils Relative: 2 %
HCT: 42.3 % (ref 36.0–46.0)
Hemoglobin: 14.2 g/dL (ref 12.0–15.0)
Immature Granulocytes: 0 %
Lymphocytes Relative: 18 %
Lymphs Abs: 1.8 10*3/uL (ref 0.7–4.0)
MCH: 31 pg (ref 26.0–34.0)
MCHC: 33.6 g/dL (ref 30.0–36.0)
MCV: 92.4 fL (ref 80.0–100.0)
Monocytes Absolute: 0.7 10*3/uL (ref 0.1–1.0)
Monocytes Relative: 7 %
Neutro Abs: 7.2 10*3/uL (ref 1.7–7.7)
Neutrophils Relative %: 72 %
Platelet Count: 375 10*3/uL (ref 150–400)
RBC: 4.58 MIL/uL (ref 3.87–5.11)
RDW: 12.1 % (ref 11.5–15.5)
WBC Count: 9.9 10*3/uL (ref 4.0–10.5)
nRBC: 0 % (ref 0.0–0.2)

## 2020-07-26 LAB — GENETIC SCREENING ORDER

## 2020-07-26 MED ORDER — TAMOXIFEN CITRATE 20 MG PO TABS
20.0000 mg | ORAL_TABLET | Freq: Every day | ORAL | 1 refills | Status: DC
Start: 2020-07-26 — End: 2020-08-17

## 2020-07-26 NOTE — Progress Notes (Signed)
REFERRING PROVIDER: [name], MD [place] [employer],  [place] [id number]  PRIMARY PROVIDER:  [name], FNP  PRIMARY REASON FOR VISIT:  1. Malignant neoplasm of lower-outer quadrant of left breast of female, estrogen receptor positive ([place])   2. Family history of breast cancer   3. Family history of prostate cancer   4. Family history of colon cancer     HISTORY OF PRESENT ILLNESS:   Ms. Esguerra, a 54 y.o. female, was seen for a  cancer genetics consultation during the breast multidisciplinary clinic at the request of Dr. Lindi Adie due to a personal history of breast cancer and family history of breast, prostate, colon, and possible ovarian cancer.  Ms. Gutzmer presents to clinic today with her husband to discuss the possibility of a hereditary predisposition to cancer, to discuss genetic testing, and to further clarify her future cancer risks, as well as potential cancer risks for family members.   In August 2021, at the age of 48, Ms. Paddock was diagnosed with invasive ductal carcinoma of of the left breast. The preliminary treatment plan includes breast MRI, breast conserving surgery with targeted node dissection, MammaPrint to determine potential benefit of chemotherapy, adjuvant radiation, and adjuvant antiestrogen therapy.   CANCER HISTORY:  Oncology History  Malignant neoplasm of lower-outer quadrant of left breast of female, estrogen receptor positive ([place])  07/20/2020 Initial Diagnosis   Screening mammogram detected left breast calcifications, enlarged left axillary lymph nodes, and an asymmetry in the right breast. Diagnostic mammogram showed in the left breast, a 2.4cm mass with calcifications, 3:30 position, a left axillary lymph node with cortical thickness, and in the right breast, no suspicious masses or calcifications. Left breast biopsy showed invasive and in situ mammary carcinoma in the breast and axilla, grade 2, HER-2 equivocal by IHC  (2+), negative by FISH, ER+ 95%, PR+40%, Ki67 15%.    07/26/2020 Cancer Staging   Staging form: Breast, AJCC 8th Edition - Clinical stage from 07/26/2020: Stage IIA (cT2, cN1(f), cM0, G2, ER+, PR+, HER2-) - Signed by [name], MD on 07/26/2020    RISK FACTORS:  Menarche was at age 45.  First live birth at age 43.  OCP use for approximately 0 years.  Ovaries intact: yes.  Hysterectomy: no.  Menopausal status: premenopausal.  HRT use: 0 years. Colonoscopy: yes; patient reports most recent colonoscopy in 2019, return in 5 years Mammogram within the last year: yes. Up to date with pelvic exams: yes; patient reports most recent PAP in April 2021. Any excessive radiation exposure in the past: no  Past Medical History:  Diagnosis Date  . Allergy    seasonal  . Breast cancer ([place])   . Family history of breast cancer 07/26/2020  . Family history of colon cancer 07/26/2020  . Family history of prostate cancer 07/26/2020    Past Surgical History:  Procedure Laterality Date  . CERVICAL BIOPSY  W/ LOOP ELECTRODE EXCISION  12/2017  . CESAREAN SECTION    . DILATION AND CURETTAGE OF UTERUS      Social History   Socioeconomic History  . Marital status: Married    Spouse name: Not on file  . Number of children: Not on file  . Years of education: Not on file  . Highest education level: Not on file  Occupational History  . Not on file  Tobacco Use  . Smoking status: Never Smoker  . Smokeless tobacco: Never Used  Vaping Use  . Vaping Use: Never used  Substance  and Sexual Activity  . Alcohol use: Yes    Alcohol/week: 4.0 - 5.0 standard drinks    Types: 4 - 5 Standard drinks or equivalent per week  . Drug use: No  . Sexual activity: Not Currently    Partners: Male    Birth control/protection: None  Other Topics Concern  . Not on file  Social History Narrative  . Not on file   Social Determinants of Health   Financial Resource Strain: Low Risk   . Difficulty of Paying Living  Expenses: Not hard at all  Food Insecurity: No Food Insecurity  . Worried About Charity fundraiser in the Last Year: Never true  . Ran Out of Food in the Last Year: Never true  Transportation Needs: No Transportation Needs  . Lack of Transportation (Medical): No  . Lack of Transportation (Non-Medical): No  Physical Activity:   . Days of Exercise per Week: Not on file  . Minutes of Exercise per Session: Not on file  Stress:   . Feeling of Stress : Not on file  Social Connections:   . Frequency of Communication with Friends and Family: Not on file  . Frequency of Social Gatherings with Friends and Family: Not on file  . Attends Religious Services: Not on file  . Active Member of Clubs or Organizations: Not on file  . Attends Archivist Meetings: Not on file  . Marital Status: Not on file     FAMILY HISTORY:  We obtained a detailed, 4-generation family history.  Significant diagnoses are listed below: Family History  Problem Relation Age of Onset  . Breast cancer Mother        dx 39  . Prostate cancer Father        dx late 50s/early 41s  . Colon cancer Maternal Grandmother        early 56s  . Breast cancer Cousin        dx 31  . Esophageal cancer Maternal Grandfather        dx 15s  . Cancer Maternal Aunt        maternal grandmother's sister; possible ovarian? dx 47, d. 73s-80s  . Rectal cancer Neg Hx   . Stomach cancer Neg Hx         Ms. Fornes has one daughter, age 26, and one son, age 65.  Neither her son nor her daughter have a history of cancer.  Ms. Fuller has one maternal half brother, age 8, who does not have a cancer history.  Ms. Douty mother is 7 years old and was diagnosed with breast cancer at age 30.  Ms. Defeo maternal grandmother was diagnosed with colon cancer in her early 30s and passed away at age 4.  Ms. Tolliver reports that her grandmother's sister was diagnosed with possible ovarian cancer at 85 and passed away in her late 64s-early  54s.  No other maternal family history of cancer was reported.  Ms. Hairston father, age 20, was diagnosed with prostate cancer in his late 50s/early 67s.  She has one paternal cousin, age 63, who was diagnosed with breast cancer at age 41.  No other paternal family history of cancer was reported.   Ms. Lutter is unaware of previous family history of genetic testing for hereditary cancer risks. Patient's maternal ancestors are of [place] and [place] descent, and paternal ancestors are of [place] and [place] descent. There is no reported Ashkenazi Jewish ancestry. There is no known consanguinity.  GENETIC COUNSELING ASSESSMENT:  Ms. Bushard is a 54 y.o. female with a personal and family history of breast cancer which is somewhat suggestive of a hereditary breast cancer syndrome and predisposition to cancer given the age at which family members have been diagnosed with breast cancer and the presence of related cancers in the family. We, therefore, discussed and recommended the following at today's visit.   DISCUSSION: We discussed that 5 - 10% of cancer is hereditary, with most cases of hereditary breast cancer associated with mutations in the BRCA1 and BRCA2 genes.  There are other genes that can be associated with hereditary breast cancer syndromes.  Type of cancer risk and level of risk are gene-specific.  We discussed that testing is beneficial for several reasons including knowing how to follow individuals after completing their treatment, identifying whether potential treatment options would be beneficial, and understanding if other family members could be at risk for cancer and allowing them to undergo genetic testing.   We reviewed the characteristics, features and inheritance patterns of hereditary cancer syndromes. We also discussed genetic testing, including the appropriate family members to test, the process of testing, insurance coverage and turn-around-time for results. We discussed the implications  of a negative, positive and/or variant of uncertain significant result. In order to get genetic test results in a timely manner so that Ms. Keeter can use these genetic test results for surgical decisions, we recommended Ms. Fukuhara pursue genetic testing for the STAT Breast Cancer Panel. The STAT Breast cancer panel offered by [facility] includes sequencing and rearrangement analysis for the following 9 genes:  ATM, BRCA1, BRCA2, CDH1, CHEK2, PALB2, PTEN, STK11 and TP53.   Once complete, we recommend Ms. Volden pursue reflex genetic testing to a more comprehensive gene panel.   Ms. Leikam  was offered a common hereditary cancer panel (48 genes) and an expanded pan-cancer panel (85 genes). Ms. Groesbeck was informed of the benefits and limitations of each panel, including that expanded pan-cancer panels contain several preliminary evidence genes that do not have clear management guidelines at this point in time.  We also discussed that as the number of genes included on a panel increases, the chances of variants of uncertain significance increases.  After considering the benefits and limitations of each gene panel, Ms. Mcdougall  elected to have expanded pan-cancer panel through [facility].  The Multi-Gene Panel offered by [facility] includes sequencing and/or deletion duplication testing of the following 85 genes: AIP, ALK, APC, ATM, AXIN2,BAP1,  BARD1, BLM, BMPR1A, BRCA1, BRCA2, BRIP1, CASR, CDC73, CDH1, CDK4, CDKN1B, CDKN1C, CDKN2A (p14ARF), CDKN2A (p16INK4a), CEBPA, CHEK2, CTNNA1, DICER1, DIS3L2, EGFR (c.2369C>T, p.Thr790Met variant only), EPCAM (Deletion/duplication testing only), FH, FLCN, GATA2, GPC3, GREM1 (Promoter region deletion/duplication testing only), HOXB13 (c.251G>A, p.Gly84Glu), HRAS, KIT, MAX, MEN1, MET, MITF (c.952G>A, p.Glu318Lys variant only), MLH1, MSH2, MSH3, MSH6, MUTYH, NBN, NF1, NF2, NTHL1, PALB2, PDGFRA, PHOX2B, PMS2, POLD1, POLE, POT1, PRKAR1A, PTCH1, PTEN, RAD50, RAD51C, RAD51D, RB1, RECQL4, RET,  RNF43, RUNX1, SDHAF2, SDHA (sequence changes only), SDHB, SDHC, SDHD, SMAD4, SMARCA4, SMARCB1, SMARCE1, STK11, SUFU, TERC, TERT, TMEM127, TP53, TSC1, TSC2, VHL, WRN and WT1.   Based on Ms. Fountain's personal and family history of breast cancer, she meets NCCN medical criteria for genetic testing. Despite that she meets criteria, she may still have an out of pocket cost. We discussed that if her out of pocket cost for testing is over $100, the laboratory may contact her and confirm whether she wants to proceed with testing.  If the out of pocket cost of testing is  less than $100, she will be billed by the genetic testing laboratory.   PLAN: After considering the risks, benefits, and limitations, Ms. Ancheta provided informed consent to pursue genetic testing and the blood sample was sent to [facility] for analysis of the STAT Breast Cancer Panel and Multi-Cancer Panel. Results should be available within approximately 10 days for the STAT Panel and 3 weeks for the Multi-Cancer Panel, at which point they will be disclosed by telephone to Ms. Machuca, as will any additional recommendations warranted by these results. Ms. Mitschke will receive a summary of her genetic counseling visit and a copy of her results once available. This information will also be available in Epic.   Based on Ms. Redmond's family history, we recommended her paternal cousin, who was diagnosed with breast cancer at age 41, have genetic counseling and testing. Ms. Krienke will let us know if we can be of any assistance in coordinating genetic counseling and/or testing for this family member.   Lastly, we encouraged Ms. Mikaelian to remain in contact with cancer genetics annually so that we can continuously update the family history and inform her of any changes in cancer genetics and testing that may be of benefit for this family.   Ms. Girardot questions were answered to her satisfaction today. Our contact information was provided should  additional questions or concerns arise. Thank you for the referral and allowing [place] to share in the care of your patient.   Ary Rudnick M. Joette Catching, [place], [place] Film/video [email or web address] (P) [phone]  The patient was seen for a total of 20 minutes in face-to-face genetic counseling.  This patient was discussed with Drs. Magrinat, Lindi Adie and/or Burr Medico who agrees with the above.    _______________________________________________________________________ For Office Staff:  Number of people involved in session: 1 Was an Intern/ student involved with case: no

## 2020-07-26 NOTE — Progress Notes (Signed)
[facility] INITIAL SDOH Screening Note   Lindsay Hampton is a 54 y.o. year old female accompanied by husband, Lindsay Hampton, during [place].   Lindsay Hampton was given information about support services today including CSW contact information, information about support team members and programs.   SDOH (Social Determinants of Health) assessments performed: Yes SDOH Interventions     Most Recent Value  SDOH Interventions  SDOH Interventions for the Following Domains Depression  Depression Interventions/Treatment  Patient refuses Treatment        Family/Social Information:  . Housing Arrangement: patient lives with husband, Lindsay Hampton - newly married since spring 2021 . Family members/support persons in your life? Husband, friends . Transportation: no concerns . Financial concerns: No  . Employment: Agricultural engineer. Income source: husband's employment . Food Security: no concerns . Religious or spiritual practice: yes, has own spirituality . Medication Concerns: no (if patient is experiencing medication concerns, please refer to pharmacy) . Services Currently in place:  Xanax prescribed by PCP  . Concerns about diagnosis and/or treatment: feels better after meeting with medical team and hearing about treatment plan. Patient does have anxiety which is decreased after meeting with team. Takes medication, has previously had therapy, declines any other therapy at this time . Patient reported stressors: Anxiety and Adjusting to my illness . Patient enjoys decorating, artistic pursuits  Current coping skills/ strengths: Capable of independent living Motivation for treatment/growth     SUMMARY: Current SDOH Barriers:  Marland Kitchen Mental Health Concerns   Clinical Social Work Clinical Goal(s):  Marland Kitchen Over the next 30 days, patient will attend all scheduled medical appointments: and take medication as prescribed  Interventions: . Patient interviewed and SDOH assessment performed . Provided patient with  information about counseling and support program options   Follow Up Plan: Client will attend all medical appointments. Client will contact CSW should she decide to utilize counseling resources Patient verbalizes understanding of plan: Yes   Edwinna Areola Marquinn Meschke LCSW

## 2020-07-26 NOTE — Progress Notes (Signed)
Radiation Oncology         [phone] ________________________________  Name: Lindsay Hampton        MRN: [id number]  Date of Service: 07/26/2020 DOB: 11-21-1966  [id number], Marvis Repress, FNP  [name], MD     REFERRING PHYSICIAN: [name], MD   DIAGNOSIS: The encounter diagnosis was Malignant neoplasm of lower-outer quadrant of left breast of female, estrogen receptor positive ([place]).   HISTORY OF PRESENT ILLNESS: Lindsay Hampton is a 54 y.o. female seen in the multidisciplinary breast clinic for a new diagnosis of left breast cancer. The patient was noted to have a screening detected distortion in the left breast. She proceeded with diagnostic imaging which revealed a mass at 3:30 measuring 24 x 2.2 x 1.8 cm and one abnormal appearing axillary node was noted. A biopsy on 07/17/20 revealed a grade 2, invasive ductal carcinoma with associated DCIS. Her tumor was ER/PR positive, and HER2 was equivocal by IHC, but negative by FISH, her ratio and copy rate was discussed as being negative overall. An axillary node was also biopsied and was positive for carcinoma. She's seen today to discuss options of treatment of her cancer.    PREVIOUS RADIATION THERAPY: No   PAST MEDICAL HISTORY:  Past Medical History:  Diagnosis Date  . Allergy    seasonal       PAST SURGICAL HISTORY: Past Surgical History:  Procedure Laterality Date  . CERVICAL BIOPSY  W/ LOOP ELECTRODE EXCISION  12/2017  . CESAREAN SECTION    . DILATION AND CURETTAGE OF UTERUS       FAMILY HISTORY:  Family History  Problem Relation Age of Onset  . Breast cancer Mother        in her early 90's, now in her 80's  . Prostate cancer Father   . Colon cancer Maternal Grandmother 39  . Breast cancer Cousin        in 67's  . Esophageal cancer Maternal Grandfather   . Rectal cancer Neg Hx   . Stomach cancer Neg Hx      SOCIAL HISTORY:  reports that she has never smoked. She has never used  smokeless tobacco. She reports current alcohol use of about 4.0 - 5.0 standard drinks of alcohol per week. She reports that she does not use drugs.  The patient is married and lives in [place]. 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  ALLERGIES: Patient has no known allergies.   MEDICATIONS:  Current Outpatient Medications  Medication Sig Dispense Refill  . ALPRAZolam (XANAX) 0.25 MG tablet Take 1 tablet (0.25 mg total) by mouth 2 (two) times daily as needed for anxiety. 30 tablet 0  . fluticasone (FLONASE) 50 MCG/ACT nasal spray Place 2 sprays into both nostrils daily. 16 g 6  . olopatadine (PATADAY) 0.1 % ophthalmic solution Place 1 drop into both eyes 2 (two) times daily. 5 mL 3  . Vitamin D, Ergocalciferol, (DRISDOL) 1.25 MG (50000 UNIT) CAPS capsule Take 50,000 Units by mouth every 7 (seven) days.     No current facility-administered medications for this encounter.     REVIEW OF SYSTEMS: On review of systems, the patient reports that she is doing well overall. No specific breast complaints are verbalized. She is feeling better overall knowing the next steps for treatment of her cancer.     PHYSICAL EXAM:  Wt Readings from Last 3 Encounters:  07/14/20 225 lb (102.1 kg)  04/13/20 224 lb (101.6 kg)  03/22/20 223 lb (  101.2 kg)   Temp Readings from Last 3 Encounters:  07/14/20 98.5 F (36.9 C) (Oral)  04/13/20 98.1 F (36.7 C)  03/22/20 97.9 F (36.6 C)   BP Readings from Last 3 Encounters:  07/14/20 140/90  04/13/20 122/62  03/22/20 126/72   Pulse Readings from Last 3 Encounters:  07/14/20 (!) 102  04/13/20 91  03/22/20 83    In general this is a well appearing caucasian female in no acute distress. She's alert and oriented x4 and appropriate throughout the examination. Cardiopulmonary assessment is negative for acute distress and she exhibits normal effort. Bilateral breast exam is deferred.    ECOG = 0  0 - Asymptomatic (Fully active, able to carry on all  predisease activities without restriction)  1 - Symptomatic but completely ambulatory (Restricted in physically strenuous activity but ambulatory and able to carry out work of a light or sedentary nature. For example, light housework, office work)  2 - Symptomatic, <50% in bed during the day (Ambulatory and capable of all self care but unable to carry out any work activities. Up and about more than 50% of waking hours)  3 - Symptomatic, >50% in bed, but not bedbound (Capable of only limited self-care, confined to bed or chair 50% or more of waking hours)  4 - Bedbound (Completely disabled. Cannot carry on any self-care. Totally confined to bed or chair)  5 - Death   Eustace Pen MM, Creech RH, Tormey DC, et al. [phone]). "Toxicity and response criteria of the [facility]". [place] Oncol. 5 (6): 649-55    LABORATORY DATA:  Lab Results  Component Value Date   WBC 10.1 11/17/2019   HGB 13.7 11/17/2019   HCT 40.3 11/17/2019   MCV 93.0 11/17/2019   PLT 298.0 11/17/2019   Lab Results  Component Value Date   NA 137 11/17/2019   K 3.7 11/17/2019   CL 103 11/17/2019   CO2 20 11/17/2019   Lab Results  Component Value Date   ALT 31 11/17/2019   AST 24 11/17/2019   ALKPHOS 39 11/17/2019   BILITOT 0.8 11/17/2019      RADIOGRAPHY: US BREAST LTD UNI LEFT INC AXILLA  Result Date: 07/12/2020 CLINICAL DATA:  Screening recall for a possible distortion and associated calcifications in the left breast. A possible abnormal left axillary lymph node, and a more global asymmetry in the right breast. Patient has had no previous surgery and is not on hormone replacement therapy. EXAM: DIGITAL DIAGNOSTIC BILATERAL MAMMOGRAM WITH CAD AND TOMO ULTRASOUND BILATERAL BREAST COMPARISON:  Previous exam(s). ACR Breast Density Category b: There are scattered areas of fibroglandular density. FINDINGS: Left breast: The area of distortion persists on spot compression imaging, associated with  small irregular/pleomorphic calcifications, and a possible underlying mass measuring approximately 7 mm. In the left axilla, a single partly imaged lymph node appears thickened. Right breast: The area asymmetry noted in the upper outer right breast appears as fibroglandular tissue on the spot compression tomosynthesis images. There is no underlying mass or discrete asymmetry and there is no architectural distortion. There are no suspicious calcifications. Mammographic images were processed with CAD. On physical exam, there is a possible small mass in the left breast lateral to the nipple. Targeted left breast ultrasound is performed, showing an irregular hypoechoic mass at 3:30 o'clock, 3 cm the nipple, with several associated echogenic foci reflecting calcifications. The mass measures approximately 2.4 x 1.8 x 2.2 cm. In the left axilla, there is a single abnormal  lymph node with a maximal cortical thickness of 6 mm. Targeted right breast ultrasound is performed, showing normal fibroglandular tissue throughout the entire upper outer right breast. No mass or suspicious lesion. No abnormal shadowing. IMPRESSION: 1. 2.4 cm highly suspicious mass in the left breast at 3:30 o'clock, 3 cm from the nipple. Tissue sampling is recommended. 2. Single abnormal left axillary lymph node. Tissue sampling is recommended. 3. No evidence of right breast malignancy. RECOMMENDATION: 1. Ultrasound-guided core needle biopsy of the 3:30 o'clock position left breast mass and the single abnormal left axillary lymph node. This procedure was scheduled for Monday, August 23rd, 2021, and 7:30 a.m. I have discussed the findings and recommendations with the patient. If applicable, a reminder letter will be sent to the patient regarding the next appointment. BI-RADS CATEGORY  5: Highly suggestive of malignancy. Electronically Signed   By: Lajean Manes M.D.   On: 07/12/2020 12:24   US BREAST LTD UNI RIGHT INC AXILLA  Result Date:  07/12/2020 CLINICAL DATA:  Screening recall for a possible distortion and associated calcifications in the left breast. A possible abnormal left axillary lymph node, and a more global asymmetry in the right breast. Patient has had no previous surgery and is not on hormone replacement therapy. EXAM: DIGITAL DIAGNOSTIC BILATERAL MAMMOGRAM WITH CAD AND TOMO ULTRASOUND BILATERAL BREAST COMPARISON:  Previous exam(s). ACR Breast Density Category b: There are scattered areas of fibroglandular density. FINDINGS: Left breast: The area of distortion persists on spot compression imaging, associated with small irregular/pleomorphic calcifications, and a possible underlying mass measuring approximately 7 mm. In the left axilla, a single partly imaged lymph node appears thickened. Right breast: The area asymmetry noted in the upper outer right breast appears as fibroglandular tissue on the spot compression tomosynthesis images. There is no underlying mass or discrete asymmetry and there is no architectural distortion. There are no suspicious calcifications. Mammographic images were processed with CAD. On physical exam, there is a possible small mass in the left breast lateral to the nipple. Targeted left breast ultrasound is performed, showing an irregular hypoechoic mass at 3:30 o'clock, 3 cm the nipple, with several associated echogenic foci reflecting calcifications. The mass measures approximately 2.4 x 1.8 x 2.2 cm. In the left axilla, there is a single abnormal lymph node with a maximal cortical thickness of 6 mm. Targeted right breast ultrasound is performed, showing normal fibroglandular tissue throughout the entire upper outer right breast. No mass or suspicious lesion. No abnormal shadowing. IMPRESSION: 1. 2.4 cm highly suspicious mass in the left breast at 3:30 o'clock, 3 cm from the nipple. Tissue sampling is recommended. 2. Single abnormal left axillary lymph node. Tissue sampling is recommended. 3. No evidence of  right breast malignancy. RECOMMENDATION: 1. Ultrasound-guided core needle biopsy of the 3:30 o'clock position left breast mass and the single abnormal left axillary lymph node. This procedure was scheduled for Monday, August 23rd, 2021, and 7:30 a.m. I have discussed the findings and recommendations with the patient. If applicable, a reminder letter will be sent to the patient regarding the next appointment. BI-RADS CATEGORY  5: Highly suggestive of malignancy. Electronically Signed   By: Lajean Manes M.D.   On: 07/12/2020 12:24   MM DIAG BREAST TOMO BILATERAL  Result Date: 07/12/2020 CLINICAL DATA:  Screening recall for a possible distortion and associated calcifications in the left breast. A possible abnormal left axillary lymph node, and a more global asymmetry in the right breast. Patient has had no previous surgery and is not  on hormone replacement therapy. EXAM: DIGITAL DIAGNOSTIC BILATERAL MAMMOGRAM WITH CAD AND TOMO ULTRASOUND BILATERAL BREAST COMPARISON:  Previous exam(s). ACR Breast Density Category b: There are scattered areas of fibroglandular density. FINDINGS: Left breast: The area of distortion persists on spot compression imaging, associated with small irregular/pleomorphic calcifications, and a possible underlying mass measuring approximately 7 mm. In the left axilla, a single partly imaged lymph node appears thickened. Right breast: The area asymmetry noted in the upper outer right breast appears as fibroglandular tissue on the spot compression tomosynthesis images. There is no underlying mass or discrete asymmetry and there is no architectural distortion. There are no suspicious calcifications. Mammographic images were processed with CAD. On physical exam, there is a possible small mass in the left breast lateral to the nipple. Targeted left breast ultrasound is performed, showing an irregular hypoechoic mass at 3:30 o'clock, 3 cm the nipple, with several associated echogenic foci reflecting  calcifications. The mass measures approximately 2.4 x 1.8 x 2.2 cm. In the left axilla, there is a single abnormal lymph node with a maximal cortical thickness of 6 mm. Targeted right breast ultrasound is performed, showing normal fibroglandular tissue throughout the entire upper outer right breast. No mass or suspicious lesion. No abnormal shadowing. IMPRESSION: 1. 2.4 cm highly suspicious mass in the left breast at 3:30 o'clock, 3 cm from the nipple. Tissue sampling is recommended. 2. Single abnormal left axillary lymph node. Tissue sampling is recommended. 3. No evidence of right breast malignancy. RECOMMENDATION: 1. Ultrasound-guided core needle biopsy of the 3:30 o'clock position left breast mass and the single abnormal left axillary lymph node. This procedure was scheduled for Monday, August 23rd, 2021, and 7:30 a.m. I have discussed the findings and recommendations with the patient. If applicable, a reminder letter will be sent to the patient regarding the next appointment. BI-RADS CATEGORY  5: Highly suggestive of malignancy. Electronically Signed   By: Lajean Manes M.D.   On: 07/12/2020 12:24   MM 3D SCREEN BREAST BILATERAL  Result Date: 07/06/2020 CLINICAL DATA:  Screening. EXAM: DIGITAL SCREENING BILATERAL MAMMOGRAM WITH TOMO AND CAD COMPARISON:  Previous exam(s). ACR Breast Density Category b: There are scattered areas of fibroglandular density. FINDINGS: In the left breast, possible distortion with associated calcifications warrants further evaluation. It is best seen in the CC slice 54 and MLO slice 62. It is in the anterior outer breast at 4 o'clock. In addition, LEFT axillary lymph nodes are increased in size. In the RIGHT upper outer breast, there is possible global asymmetry. Images were processed with CAD. IMPRESSION: 1. Further evaluation is suggested for possible distortion with associated calcifications in the left breast. 2. Further evaluation is suggested for increased size of LEFT  axillary lymph nodes. 3. Further evaluation is suggested for global asymmetry of the RIGHT upper outer breast. Recommend correlation with any weight loss or hormonal use. RECOMMENDATION: 1. Diagnostic mammogram and ultrasound of the left breast and axilla. (Code:FI-L-107M) 2. Diagnostic mammogram and possibly ultrasound of the right breast. (Code:FI-R-107M) The patient will be contacted regarding the findings, and additional imaging will be scheduled. BI-RADS CATEGORY  0: Incomplete. Need additional imaging evaluation and/or prior mammograms for comparison. Electronically Signed   By: Valentino Saxon MD   On: 07/06/2020 16:54   [place] AXILLARY NODE CORE BIOPSY LEFT  Addendum Date: 07/18/2020   ADDENDUM REPORT: 07/18/2020 14:22 ADDENDUM: Pathology revealed GRADE II INVASIVE MAMMARY CARCINOMA, MAMMARY CARCINOMA IN SITU, CALCIFICATIONS of the Left breast, 3:30 o'clock. This was found to be concordant  by Dr. Audie Pinto. Pathology revealed METASTATIC CARCINOMA of the Left axilla. This was found to be concordant by Dr. Audie Pinto. Pathology results were discussed with the patient by telephone. The patient reported doing well after the biopsies with tenderness at the sites. Post biopsy instructions and care were reviewed and questions were answered. The patient was encouraged to call [facility] for any additional concerns. My direct phone number was provided. The patient was referred to [facility] at [facility] [address] on July 26, 2020. Recommendation for a bilateral breast MRI for extent of disease, given ill-defined margins of mass. Pathology results reported by Terie Purser, RN on 07/18/2020. Electronically Signed   By: Audie Pinto M.D.   On: 07/18/2020 14:22   Result Date: 07/18/2020 CLINICAL DATA:  54 year old female presenting for biopsy of a left breast mass and left axillary lymph node. EXAM: ULTRASOUND  GUIDED LEFT BREAST CORE NEEDLE BIOPSY ULTRASOUND GUIDED LEFT AXILLARY CORE NEEDLE BIOPSY COMPARISON:  Previous exam(s). PROCEDURE: I met with the patient and we discussed the procedure of ultrasound-guided biopsy, including benefits and alternatives. We discussed the high likelihood of a successful procedure. We discussed the risks of the procedure, including infection, bleeding, tissue injury, clip migration, and inadequate sampling. Informed written consent was given. The usual time-out protocol was performed immediately prior to the procedure. 1.  Lesion quadrant: Lower outer quadrant Using sterile technique and 1% Lidocaine as local anesthetic, under direct ultrasound visualization, a 12 gauge spring-loaded device was used to perform biopsy of a left breast mass at 3:30 o'clock using a lateral approach. At the conclusion of the procedure a ribbon tissue marker clip was deployed into the biopsy cavity. Follow up 2 view mammogram was performed and dictated separately. 2. Axilla Using sterile technique and 1% Lidocaine as local anesthetic, under direct ultrasound visualization, a 14 gauge spring-loaded device was used to perform biopsy of a left axillary lymph node using a lateral approach. At the conclusion of the procedure a HydroMARK tissue marker clip was deployed into the biopsy cavity. Follow up 2 view mammogram was performed and dictated separately. IMPRESSION: Ultrasound guided biopsy of a left breast mass at 3:30 o'clock and of a left axillary lymph node. No apparent complications. Electronically Signed: By: Audie Pinto M.D. On: 07/17/2020 08:40   MM CLIP PLACEMENT LEFT  Result Date: 07/17/2020 CLINICAL DATA:  54 year old female presenting for biopsy of a left breast mass as well as left axillary lymph node. EXAM: DIAGNOSTIC LEFT MAMMOGRAM POST ULTRASOUND BIOPSY COMPARISON:  Previous exam(s). FINDINGS: Mammographic images were obtained following ultrasound guided biopsy of a left breast mass at  3:30 o'clock. The ribbon biopsy marking clip is in expected position at the site of biopsy. Mammographic images were obtained following ultrasound guided biopsy of a left axillary lymph node. [facility] biopsy marking clip is in expected position at the site of biopsy. IMPRESSION: Appropriate positioning of the ribbon shaped biopsy marking clip at the site of biopsy in the left breast at 3:30 o'clock. Appropriate positioning of the [facility] shaped biopsy marking clip at the site of biopsy in the left axilla. Final Assessment: Post Procedure Mammograms for Marker Placement Electronically Signed   By: Audie Pinto M.D.   On: 07/17/2020 08:38   [place] LT BREAST BX W LOC DEV 1ST LESION IMG BX SPEC US GUIDE  Addendum Date: 07/18/2020   ADDENDUM REPORT: 07/18/2020 14:22 ADDENDUM: Pathology revealed GRADE II INVASIVE MAMMARY CARCINOMA, MAMMARY  CARCINOMA IN SITU, CALCIFICATIONS of the Left breast, 3:30 o'clock. This was found to be concordant by Dr. Audie Pinto. Pathology revealed METASTATIC CARCINOMA of the Left axilla. This was found to be concordant by Dr. Audie Pinto. Pathology results were discussed with the patient by telephone. The patient reported doing well after the biopsies with tenderness at the sites. Post biopsy instructions and care were reviewed and questions were answered. The patient was encouraged to call [facility] for any additional concerns. My direct phone number was provided. The patient was referred to [facility] at [facility] on July 26, 2020. Recommendation for a bilateral breast MRI for extent of disease, given ill-defined margins of mass. Pathology results reported by Terie Purser, RN on 07/18/2020. Electronically Signed   By: Audie Pinto M.D.   On: 07/18/2020 14:22   Result Date: 07/18/2020 CLINICAL DATA:  54 year old female presenting for biopsy of a left breast mass and  left axillary lymph node. EXAM: ULTRASOUND GUIDED LEFT BREAST CORE NEEDLE BIOPSY ULTRASOUND GUIDED LEFT AXILLARY CORE NEEDLE BIOPSY COMPARISON:  Previous exam(s). PROCEDURE: I met with the patient and we discussed the procedure of ultrasound-guided biopsy, including benefits and alternatives. We discussed the high likelihood of a successful procedure. We discussed the risks of the procedure, including infection, bleeding, tissue injury, clip migration, and inadequate sampling. Informed written consent was given. The usual time-out protocol was performed immediately prior to the procedure. 1.  Lesion quadrant: Lower outer quadrant Using sterile technique and 1% Lidocaine as local anesthetic, under direct ultrasound visualization, a 12 gauge spring-loaded device was used to perform biopsy of a left breast mass at 3:30 o'clock using a lateral approach. At the conclusion of the procedure a ribbon tissue marker clip was deployed into the biopsy cavity. Follow up 2 view mammogram was performed and dictated separately. 2. Axilla Using sterile technique and 1% Lidocaine as local anesthetic, under direct ultrasound visualization, a 14 gauge spring-loaded device was used to perform biopsy of a left axillary lymph node using a lateral approach. At the conclusion of the procedure a HydroMARK tissue marker clip was deployed into the biopsy cavity. Follow up 2 view mammogram was performed and dictated separately. IMPRESSION: Ultrasound guided biopsy of a left breast mass at 3:30 o'clock and of a left axillary lymph node. No apparent complications. Electronically Signed: By: Audie Pinto M.D. On: 07/17/2020 08:40       IMPRESSION/PLAN: 1. Stage IIA, cT2N1M0 grade 2, ER/PR positive invasive ductal carcinoma of the left breast. Dr. Lisbeth Renshaw discusses the pathology findings and reviews the nature of left node positive breast disease. The consensus from the breast conference includes an MRI to determine extent of disease. This  will help determine role for mammaprint on her biopsy for possible neoadjuvant chemotherapy, versus breast conservation with lumpectomy with sentinel node biopsy followed by mammaprint to determine systemic therapy in the adjuvant setting. We reviewed the rational for external radiotherapy to the breast and regional nodes followed by antiestrogen therapy. We discussed the risks, benefits, short, and long term effects of radiotherapy, and the patient is interested in proceeding. Dr. Lisbeth Renshaw discusses the delivery and logistics of radiotherapy and anticipates a course of 6 1/2 weeks of radiotherapy to the left breast and regional nodes with deep inspiration breath hold technique. We will see her back a few weeks after surgery to discuss the simulation process and anticipate we starting radiotherapy about 4-6 weeks after surgery.  2.  Possible genetic predisposition to malignancy. The patient is a candidate for genetic testing given her personal and family history. She was offered referral and is interested in meeting the geneticist today.    In a visit lasting 45 minutes, greater than 50% of the time was spent face to face reviewing her case, as well as in preparation of, discussing, and coordinating the patient's care.  The above documentation reflects my direct findings during this shared patient visit. Please see the separate note by Dr. Lisbeth Renshaw on this date for the remainder of the patient's plan of care.    Carola Rhine, PAC

## 2020-07-26 NOTE — Therapy (Signed)
[place], [place], [address] Phone: [phone]   Fax:  [phone]  Physical Therapy Evaluation  Patient Details  Name: Lindsay Hampton MRN: [id number] Date of Birth: 08-09-1966 Referring Provider (PT): Dr. [name]   Encounter Date: 07/26/2020   PT End of Session - 07/26/20 1138    Visit Number 1    Number of Visits 2    Date for PT Re-Evaluation 09/20/20    PT Start Time 4854    PT Stop Time 6270   Also saw pt from 1011-1017 for a total of 30 minutes   PT Time Calculation (min) 24 min    Activity Tolerance Patient tolerated treatment well    Behavior During Therapy [facility] for tasks assessed/performed           Past Medical History:  Diagnosis Date  . Allergy    seasonal  . Breast cancer [facility])     Past Surgical History:  Procedure Laterality Date  . CERVICAL BIOPSY  W/ LOOP ELECTRODE EXCISION  12/2017  . CESAREAN SECTION    . DILATION AND CURETTAGE OF UTERUS      There were no vitals filed for this visit.    Subjective Assessment - 07/26/20 1122    Subjective Patient reports she is here today to be seen by her medical team for her newly diagnosed left breast cancer.    Patient is accompained by: Family member    Pertinent History Patient was diagnosed on 07/06/2020 with left grade II invasive ductal carcinoma breast cancer. It measures 2.4 cm and is located in the lower outer quadrant. It is ER/PR positive and HER2 negative with a Ki67 of 15%. She has a positive axillary lymph node.    Patient Stated Goals Reduce lymphedema risk and learn post op shoulder ROM HEP    Currently in Pain? No/denies              [facility] PT Assessment - 07/26/20 0001      Assessment   Medical Diagnosis Left breast cancer    Referring Provider (PT) Dr. [name]    Onset Date/Surgical Date 07/06/20    Hand Dominance Right    Prior Therapy none      Precautions   Precautions Other  (comment)    Precaution Comments Active cancer      Restrictions   Weight Bearing Restrictions No      Balance Screen   Has the patient fallen in the past 6 months No    Has the patient had a decrease in activity level because of a fear of falling?  No    Is the patient reluctant to leave their home because of a fear of falling?  No      Home Ecologist residence    Living Arrangements Spouse/significant other    Available Help at Discharge Family      Prior Function   Level of [place] Unemployed    Leisure She does not exercise      Cognition   Overall Cognitive Status Within Functional Limits for tasks assessed      Posture/Postural Control   Posture/Postural Control Postural limitations    Postural Limitations Rounded Shoulders;Forward head      ROM / Strength   AROM / PROM / Strength AROM;Strength      AROM   Overall AROM Comments Cervical AROM is WNL    AROM Assessment  Site Shoulder    Right/Left Shoulder Right;Left    Right Shoulder Extension 45 Degrees    Right Shoulder Flexion 142 Degrees    Right Shoulder ABduction 159 Degrees    Right Shoulder Internal Rotation 61 Degrees    Right Shoulder External Rotation 85 Degrees    Left Shoulder Extension 52 Degrees    Left Shoulder Flexion 142 Degrees    Left Shoulder ABduction 154 Degrees    Left Shoulder Internal Rotation 56 Degrees    Left Shoulder External Rotation 74 Degrees      Strength   Overall Strength Within functional limits for tasks performed             LYMPHEDEMA/ONCOLOGY QUESTIONNAIRE - 07/26/20 0001      Type   Cancer Type Left breast cancer      Lymphedema Assessments   Lymphedema Assessments Upper extremities      Right Upper Extremity Lymphedema   10 cm Proximal to Olecranon Process 38 cm    Olecranon Process 26.3 cm    10 cm Proximal to Ulnar Styloid Process 25.3 cm    Just Proximal to Ulnar Styloid Process 18.3 cm     Across Hand at [employer] 20.3 cm    At [place] of 2nd Digit 6.8 cm      Left Upper Extremity Lymphedema   10 cm Proximal to Olecranon Process 39.2 cm    Olecranon Process 27.3 cm    10 cm Proximal to Ulnar Styloid Process 25.5 cm    Just Proximal to Ulnar Styloid Process 18.5 cm    Across Hand at [employer] 20.3 cm    At [place] of 2nd Digit 6.8 cm           L-DEX FLOWSHEETS - 07/26/20 1100      L-DEX LYMPHEDEMA SCREENING   Measurement Type Unilateral    L-DEX MEASUREMENT EXTREMITY Upper Extremity    POSITION  Standing    DOMINANT SIDE Right    At Risk Side Left    BASELINE SCORE (UNILATERAL) 4.3           The patient was assessed using the L-Dex machine today to produce a lymphedema index baseline score. The patient will be reassessed on a regular basis (typically every 3 months) to obtain new L-Dex scores. If the score is > 6.5 points away from his/her baseline score indicating onset of subclinical lymphedema, it will be recommended to wear a compression garment for 4 weeks, 12 hours per day and then be reassessed. If the score continues to be > 6.5 points from baseline at reassessment, we will initiate lymphedema treatment. Assessing in this manner has a 95% rate of preventing clinically significant lymphedema.      Katina Dung - 07/26/20 0001    Open a tight or new jar No difficulty    Do heavy household chores (wash walls, wash floors) No difficulty    Carry a shopping bag or briefcase No difficulty    Wash your back No difficulty    Use a knife to cut food No difficulty    Recreational activities in which you take some force or impact through your arm, shoulder, or hand (golf, hammering, tennis) No difficulty    During the past week, to what extent has your arm, shoulder or hand problem interfered with your normal social activities with family, friends, neighbors, or groups? Not at all    During the past week, to what extent has your arm, shoulder  or hand problem  limited your work or other regular daily activities Not at all    Arm, shoulder, or hand pain. Mild    Tingling (pins and needles) in your arm, shoulder, or hand None    Difficulty Sleeping No difficulty    DASH Score 2.27 %            Objective measurements completed on examination: See above findings.         Patient was instructed today in a home exercise program today for post op shoulder range of motion. These included active assist shoulder flexion in sitting, scapular retraction, wall walking with shoulder abduction, and hands behind head external rotation.  She was encouraged to do these twice a day, holding 3 seconds and repeating 5 times when permitted by her physician.          PT Education - 07/26/20 1134    Education Details Lymphedema risk reduction and post op shoulder ROM HEP    Person(s) Educated Patient;Spouse    Methods Explanation;Demonstration;Handout    Comprehension Returned demonstration;Verbalized understanding               PT Long Term Goals - 07/26/20 1141      PT LONG TERM GOAL #1   Title Patient will demonstrate she has regained full shoulder ROM and function post operatively compared to baseline measurements.    Time 8    Period Weeks    Status New    Target Date 09/20/20           Breast Clinic Goals - 07/26/20 1147      Patient will be able to verbalize understanding of pertinent lymphedema risk reduction practices relevant to her diagnosis specifically related to skin care.   Time 1    Period Days    Status Achieved      Patient will be able to return demonstrate and/or verbalize understanding of the post-op home exercise program related to regaining shoulder range of motion.   Time 1    Period Days    Status Achieved      Patient will be able to verbalize understanding of the importance of attending the postoperative After Breast Cancer Class for further lymphedema risk reduction education and therapeutic exercise.    Time 1    Period Days    Status Achieved                 Plan - 07/26/20 1142    Clinical Impression Statement Patient was diagnosed on 07/06/2020 with left grade II invasive ductal carcinoma breast cancer. It measures 2.4 cm and is located in the lower outer quadrant. It is ER/PR positive and HER2 negative with a Ki67 of 15%. She has a positive axillary lymph node. Her multidisciplinary medical team met prior to her assessments to determine a recommended treatment plan. She is planning to have an MRI and depending on those results, she may have a left lumpectomy and targeted axillary lymph node dissection or neoadjuvant chemotherapy if Mammaprint is high, followed by radiation and anti-estrogen therapy. She will benefit from a post op PT reassessment to determine needs and from L-Dex screenings every 3 months to detect subclinical lymphedema.    Stability/Clinical Decision Making Stable/Uncomplicated    Clinical Decision Making Low    Rehab Potential Excellent    PT Frequency --   Eval and 1 f/u visit   PT Treatment/Interventions ADLs/Self Care Home Management;Therapeutic exercise;Patient/family education    PT Next Visit Plan Will  reassess 3-4 weeks post op to determine needs    PT Home Exercise Plan Post op shoulder ROM HEP    Consulted and Agree with Plan of Care Patient;Family member/caregiver    Family Member Consulted Husband           Patient will benefit from skilled therapeutic intervention in order to improve the following deficits and impairments:  Postural dysfunction, Decreased knowledge of precautions, Impaired UE functional use, Pain, Decreased range of motion  Visit Diagnosis: Malignant neoplasm of lower-outer quadrant of left breast of female, estrogen receptor positive ([place]) - Plan: PT plan of care cert/re-cert  Abnormal posture - Plan: PT plan of care cert/re-cert   Patient will follow up at outpatient cancer rehab 3-4 weeks following surgery.  If the patient  requires physical therapy at that time, a specific plan will be dictated and sent to the referring physician for approval. The patient was educated today on appropriate basic range of motion exercises to begin post operatively and the importance of attending the After Breast Cancer class following surgery.  Patient was educated today on lymphedema risk reduction practices as it pertains to recommendations that will benefit the patient immediately following surgery.  She verbalized good understanding.      Problem List Patient Active Problem List   Diagnosis Date Noted  . Malignant neoplasm of lower-outer quadrant of left breast of female, estrogen receptor positive ([place]) 07/20/2020   Annia Friendly, PT 07/26/20 11:50 AM  [place], [place], [address] Phone: [phone]   Fax:  [phone]  Name: Kaziyah Parkison MRN: [id number] Date of Birth: [birth year]

## 2020-07-26 NOTE — Patient Instructions (Signed)

## 2020-07-26 NOTE — Assessment & Plan Note (Signed)
Screening mammogram detected left breast calcifications, enlarged left axillary lymph node, and an asymmetry in the right breast. Diagnostic mammogram showed in the left breast, a 2.4cm mass with calcifications, 3:30 position, a left axillary lymph node with cortical thickness, Left breast biopsy showed IDC with DCIS, grade 2, HER-2 equivocal by IHC (2+), negative by FISH, ER+ 95%, PR+40%, Ki67 15%.  T2 N1 M0 stage IIa  Pathology and radiology counseling:Discussed with the patient, the details of pathology including the type of breast cancer,the clinical staging, the significance of ER, PR and HER-2/neu receptors and the implications for treatment. After reviewing the pathology in detail, we proceeded to discuss the different treatment options between surgery, radiation, chemotherapy, antiestrogen therapies.  Recommendations: Breast MRI will be performed to see the size of the tumor as well as how many lymph nodes are involved. 1. Breast conserving surgery with targeted node dissection followed by 2. MammaPrint testing to determine if chemotherapy would be of any benefit followed by 3. Adjuvant radiation therapy followed by 4. Adjuvant antiestrogen therapy  If there is size of the tumor in the breast or the lymph nodes is high and she needs neoadjuvant therapy then we will obtain MammaPrint to guide her decision regarding chemo versus antiestrogen therapy.  I started the patient on tamoxifen because she is still premenopausal. Tamoxifen counseling: We discussed the risks and benefits of tamoxifen. These include but not limited to insomnia, hot flashes, mood changes, vaginal dryness, and weight gain. Although rare, serious side effects including endometrial cancer, risk of blood clots were also discussed. We strongly believe that the benefits far outweigh the risks. Patient understands these risks and consented to starting treatment. Planned treatment duration is 10 years.  Severe anxiety: Currently  on Xanax.  Return to clinic after surgery to discuss final pathology report and then determine if MammaPrint testing will need to be sent.

## 2020-07-28 ENCOUNTER — Telehealth: Payer: Self-pay | Admitting: Hematology and Oncology

## 2020-07-28 NOTE — Telephone Encounter (Signed)
No 9/1 los, no changes made to pt schedule

## 2020-08-01 ENCOUNTER — Telehealth: Payer: Self-pay | Admitting: Genetic Counselor

## 2020-08-01 ENCOUNTER — Encounter: Payer: Self-pay | Admitting: Genetic Counselor

## 2020-08-01 ENCOUNTER — Ambulatory Visit: Payer: Self-pay | Admitting: Genetic Counselor

## 2020-08-01 DIAGNOSIS — Z170 Estrogen receptor positive status [ER+]: Secondary | ICD-10-CM

## 2020-08-01 DIAGNOSIS — Z803 Family history of malignant neoplasm of breast: Secondary | ICD-10-CM

## 2020-08-01 DIAGNOSIS — Z1379 Encounter for other screening for genetic and chromosomal anomalies: Secondary | ICD-10-CM | POA: Insufficient documentation

## 2020-08-01 DIAGNOSIS — C50512 Malignant neoplasm of lower-outer quadrant of left female breast: Secondary | ICD-10-CM

## 2020-08-01 DIAGNOSIS — Z800 Family history of malignant neoplasm of digestive organs: Secondary | ICD-10-CM

## 2020-08-01 DIAGNOSIS — Z8042 Family history of malignant neoplasm of prostate: Secondary | ICD-10-CM

## 2020-08-01 NOTE — Progress Notes (Signed)
HPI:  Ms. Mcelveen was previously seen in the [facility] clinic due to a personal history of breast cancer, a family history of breast, prostate, colon, and ovarian cancer and concerns regarding a hereditary predisposition to cancer. Please refer to our prior cancer genetics clinic note for more information regarding our discussion, assessment and recommendations, at the time. Ms. Munnerlyn recent genetic test results were disclosed to her, as were recommendations warranted by these results. These results and recommendations are discussed in more detail below.  CANCER HISTORY:  Oncology History  Malignant neoplasm of lower-outer quadrant of left breast of female, estrogen receptor positive ([place])  07/20/2020 Initial Diagnosis   Screening mammogram detected left breast calcifications, enlarged left axillary lymph nodes, and an asymmetry in the right breast. Diagnostic mammogram showed in the left breast, a 2.4cm mass with calcifications, 3:30 position, a left axillary lymph node with cortical thickness, and in the right breast, no suspicious masses or calcifications. Left breast biopsy showed invasive and in situ mammary carcinoma in the breast and axilla, grade 2, HER-2 equivocal by IHC (2+), negative by FISH, ER+ 95%, PR+40%, Ki67 15%.    07/26/2020 Cancer Staging   Staging form: Breast, AJCC 8th Edition - Clinical stage from 07/26/2020: Stage IIA (cT2, cN1(f), cM0, G2, ER+, PR+, HER2-) - Signed by [name], MD on 07/26/2020   08/01/2020 Genetic Testing   No pathogenic variants detected in [facility] Multi-Cancer Panel.  The Multi-Cancer Panel offered by [facility] includes sequencing and/or deletion duplication testing of the following 85 genes: AIP, ALK, APC, ATM, AXIN2,BAP1,  BARD1, BLM, BMPR1A, BRCA1, BRCA2, BRIP1, CASR, CDC73, CDH1, CDK4, CDKN1B, CDKN1C, CDKN2A (p14ARF), CDKN2A (p16INK4a), CEBPA, CHEK2, CTNNA1, DICER1, DIS3L2, EGFR (c.2369C>T, p.Thr790Met variant only), EPCAM  (Deletion/duplication testing only), FH, FLCN, GATA2, GPC3, GREM1 (Promoter region deletion/duplication testing only), HOXB13 (c.251G>A, p.Gly84Glu), HRAS, KIT, MAX, MEN1, MET, MITF (c.952G>A, p.Glu318Lys variant only), MLH1, MSH2, MSH3, MSH6, MUTYH, NBN, NF1, NF2, NTHL1, PALB2, PDGFRA, PHOX2B, PMS2, POLD1, POLE, POT1, PRKAR1A, PTCH1, PTEN, RAD50, RAD51C, RAD51D, RB1, RECQL4, RET, RNF43, RUNX1, SDHAF2, SDHA (sequence changes only), SDHB, SDHC, SDHD, SMAD4, SMARCA4, SMARCB1, SMARCE1, STK11, SUFU, TERC, TERT, TMEM127, TP53, TSC1, TSC2, VHL, WRN and WT1.  The report date is August 01, 2020.      FAMILY HISTORY:  We obtained a detailed, 4-generation family history.  Significant diagnoses are listed below: Family History  Problem Relation Age of Onset  . Breast cancer Mother        dx 25  . Prostate cancer Father        dx late 50s/early 36s  . Colon cancer Maternal Grandmother        early 19s  . Breast cancer Cousin        dx 42  . Esophageal cancer Maternal Grandfather        dx 56s  . Cancer Maternal Aunt        maternal grandmother's sister; possible ovarian? dx 27, d. 6s-80s  . Rectal cancer Neg Hx   . Stomach cancer Neg Hx         Ms. Seufert has one daughter, age 85, and one son, age 39.  Neither her son nor her daughter have a history of cancer.  Ms. Eskin has one maternal half brother, age 64, who does not have a cancer history.  Ms. Abboud mother is 60 years old and was diagnosed with breast cancer at age 70.  Ms. Pry maternal grandmother was diagnosed with colon cancer in her early 67s  and passed away at age 66.  Ms. Lingerfelt reports that her grandmother's sister was diagnosed with possible ovarian cancer at 49 and passed away in her late 64s-early 4s.  No other maternal family history of cancer was reported.  Ms. Zurn father, age 43, was diagnosed with prostate cancer in his late 50s/early 60s.  She has one paternal cousin, age 72, who was diagnosed with breast  cancer at age 80.  No other paternal family history of cancer was reported.   Ms. Winner is unaware of previous family history of genetic testing for hereditary cancer risks. Patient's maternal ancestors are of [place] and [place] descent, and paternal ancestors are of [place] and [place] descent. There is no reported Ashkenazi Jewish ancestry. There is no known consanguinity.   GENETIC TEST RESULTS: Genetic testing reported out on August 01, 2020. The Multi-Cancer Panel through [facility] found no pathogenic mutations. The Multi-Cancer Panel offered by [facility] includes sequencing and/or deletion duplication testing of the following 85 genes: AIP, ALK, APC, ATM, AXIN2,BAP1,  BARD1, BLM, BMPR1A, BRCA1, BRCA2, BRIP1, CASR, CDC73, CDH1, CDK4, CDKN1B, CDKN1C, CDKN2A (p14ARF), CDKN2A (p16INK4a), CEBPA, CHEK2, CTNNA1, DICER1, DIS3L2, EGFR (c.2369C>T, p.Thr790Met variant only), EPCAM (Deletion/duplication testing only), FH, FLCN, GATA2, GPC3, GREM1 (Promoter region deletion/duplication testing only), HOXB13 (c.251G>A, p.Gly84Glu), HRAS, KIT, MAX, MEN1, MET, MITF (c.952G>A, p.Glu318Lys variant only), MLH1, MSH2, MSH3, MSH6, MUTYH, NBN, NF1, NF2, NTHL1, PALB2, PDGFRA, PHOX2B, PMS2, POLD1, POLE, POT1, PRKAR1A, PTCH1, PTEN, RAD50, RAD51C, RAD51D, RB1, RECQL4, RET, RNF43, RUNX1, SDHAF2, SDHA (sequence changes only), SDHB, SDHC, SDHD, SMAD4, SMARCA4, SMARCB1, SMARCE1, STK11, SUFU, TERC, TERT, TMEM127, TP53, TSC1, TSC2, VHL, WRN and WT1. The test report has been scanned into EPIC and is located under the Molecular Pathology section of the Results Review tab.  A portion of the result report is included below for reference.      We discussed with Ms. Brownley that because current genetic testing is not perfect, it is possible there may be a gene mutation in one of these genes that current testing cannot detect, but that chance is small.  We also discussed, that there could be another gene that has not yet been discovered, or  that we have not yet tested, that is responsible for the cancer diagnoses in the family. It is also possible there is a hereditary cause for the cancer in the family that Ms. Ruberg did not inherit and therefore was not identified in her testing.  Therefore, it is important to remain in touch with cancer genetics in the future so that we can continue to offer Ms. Gassett the most up to date genetic testing.   ADDITIONAL GENETIC TESTING: We discussed with Ms. Kittle that her genetic testing was fairly extensive.  If there are genes identified to increase cancer risk that can be analyzed in the future, we would be happy to discuss and coordinate this testing at that time.    CANCER SCREENING RECOMMENDATIONS: Ms. Robidoux test result is considered negative (normal).  This means that we have not identified a hereditary cause for her personal history of breast cancer and family history of breast, prostate, colon, and ovarian cancer at this time. Most cancers happen by chance and this negative test suggests that her cancer may fall into this category.    While reassuring, this does not definitively rule out a hereditary predisposition to cancer. It is still possible that there could be genetic mutations that are undetectable by current technology. There could be genetic mutations in genes that have  not been tested or identified to increase cancer risk.  Therefore, it is recommended she continue to follow the cancer management and screening guidelines provided by her oncology and primary healthcare provider.   An individual's cancer risk and medical management are not determined by genetic test results alone. Overall cancer risk assessment incorporates additional factors, including personal medical history, family history, and any available genetic information that may result in a personalized plan for cancer prevention and surveillance.   RECOMMENDATIONS FOR FAMILY MEMBERS:  Individuals in this family might be at  some increased risk of developing cancer, over the general population risk, simply due to the family history of cancer.  We recommended women in this family have a yearly mammogram beginning at age 40, or 12 years younger than the earliest onset of cancer, an annual clinical breast exam, and perform monthly breast self-exams. Women in this family should also have a gynecological exam as recommended by their primary provider. All family members should be referred for colonoscopy starting at age 38.  It is also possible there is a hereditary cause for the cancer in Ms. Midgett's family that she did not inherit and therefore was not identified in her.  Based on Ms. Licea's family history, we recommended her paternal cousin, who was diagnosed with breast cancer at age 72, have genetic counseling and testing. Ms. Loth will let us know if we can be of any assistance in coordinating genetic counseling and/or testing for this family member.   FOLLOW-UP: Lastly, we discussed with Ms. Kiddy that cancer genetics is a rapidly advancing field and it is possible that new genetic tests will be appropriate for her and/or her family members in the future. We encouraged her to remain in contact with cancer genetics on an annual basis so we can update her personal and family histories and let her know of advances in cancer genetics that may benefit this family.   Our contact number was provided. Ms. Wnuk questions were answered to her satisfaction, and she knows she is welcome to call us at anytime with additional questions or concerns.    Josip Merolla M. Joette Catching, [place], [facility] Certified Film/video [email or web address] (P) [phone]

## 2020-08-01 NOTE — Telephone Encounter (Signed)
Revealed negative genetic testing.  Discussed that we do not know why she has breast cancer or why there is cancer in the family. It could be due to a different gene that we are not testing, or maybe our current technology may not be able to pick something up.  It will be important for her to keep in contact with genetics to keep up with whether additional testing may be needed. 

## 2020-08-02 ENCOUNTER — Ambulatory Visit (HOSPITAL_COMMUNITY)
Admission: RE | Admit: 2020-08-02 | Discharge: 2020-08-02 | Disposition: A | Discharge status: Home / Self Care | Payer: 59 | Source: Ambulatory Visit | Attending: Surgery | Admitting: Surgery

## 2020-08-02 ENCOUNTER — Other Ambulatory Visit: Payer: Self-pay

## 2020-08-02 DIAGNOSIS — C50512 Malignant neoplasm of lower-outer quadrant of left female breast: Secondary | ICD-10-CM | POA: Diagnosis not present

## 2020-08-02 DIAGNOSIS — Z170 Estrogen receptor positive status [ER+]: Secondary | ICD-10-CM | POA: Diagnosis present

## 2020-08-02 IMAGING — MR MR BREAST BILAT WO/W CM
10 of 13 series · 28 of 48 positions shown · IV contrast (gadavist)
Comparison: Prior studies

CLINICAL DATA: Recent diagnosis of grade 2 invasive mammary
carcinoma with DCIS in the left breast at 3:30 o'clock as well a
single metastatic left axillary lymph node. Evaluate extent of
disease.

LABS:  No labs drawn at time of imaging.
EXAM:
BILATERAL BREAST MRI WITH AND WITHOUT CONTRAST
TECHNIQUE: Multiplanar, multisequence MR images of both breasts were obtained
prior to and following the intravenous administration of 10 ml of
Gadavist

[Series 2: T2 · axial · 3.0mm · 0.89mm/px · 1 of 47 slices shown]
[im 1/47]
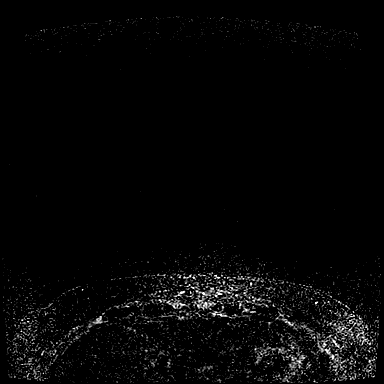

[Series 3: T1 fat-sat · axial · 1.2mm · 0.71mm/px · z∈[-123,+87]mm · 4 of 176 slices shown (1 of 5)]
[im 1/176]
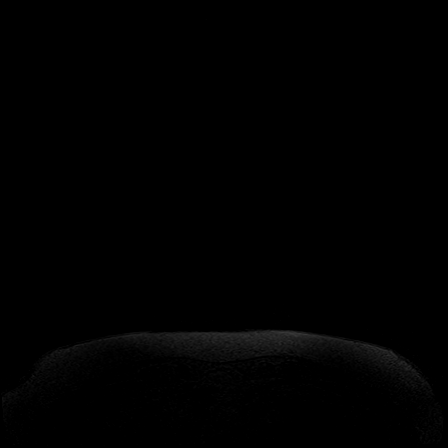
[im 59/176]
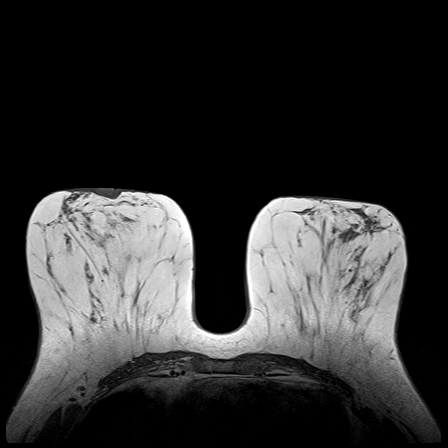
[im 117/176]
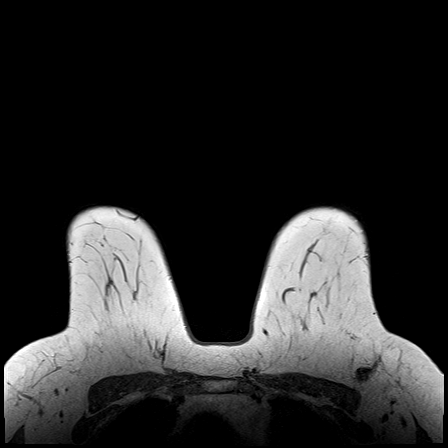
[im 176/176]
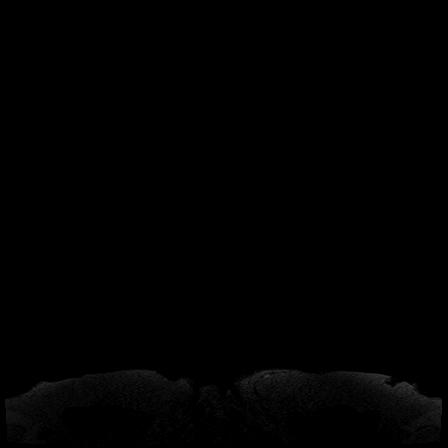

[Series 5: T1 fat-sat · axial · 1.6mm · 0.77mm/px · z∈[-112,+78]mm · 3 of 120 slices shown (2 of 5)]
[im 1/120]
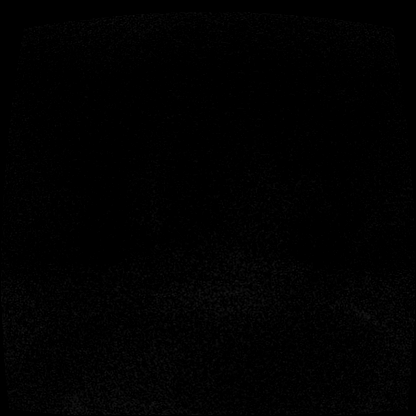
[im 60/120]
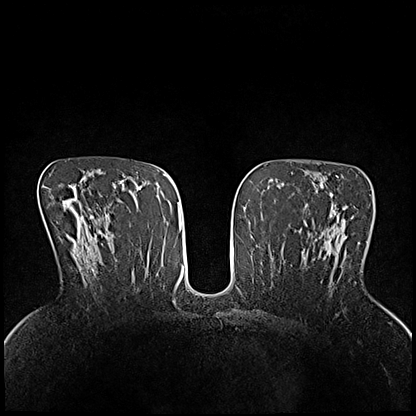
[im 120/120]
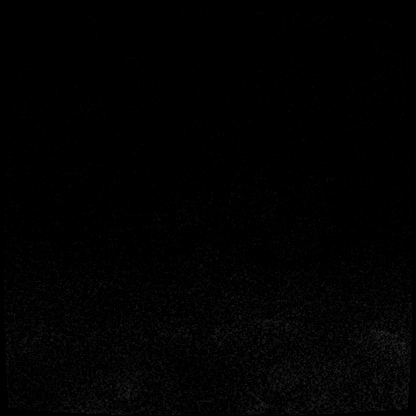

[Series 6: T1 fat-sat · axial · 1.6mm · 0.77mm/px · z∈[-112,+78]mm · 3 of 120 slices shown (3 of 5)]
[im 1/120]
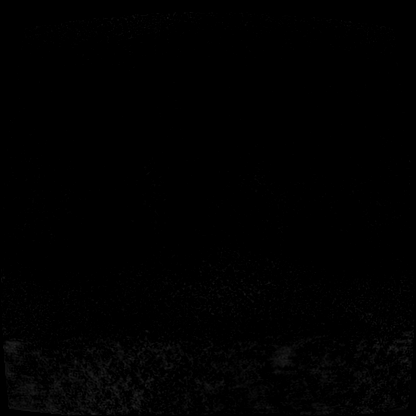
[im 60/120]
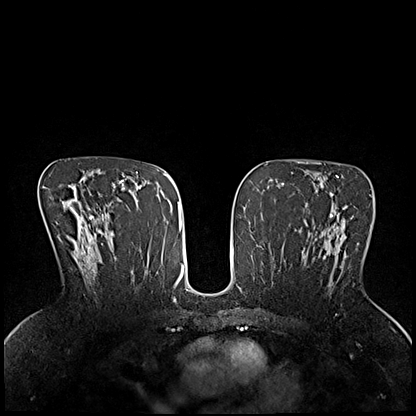
[im 120/120]
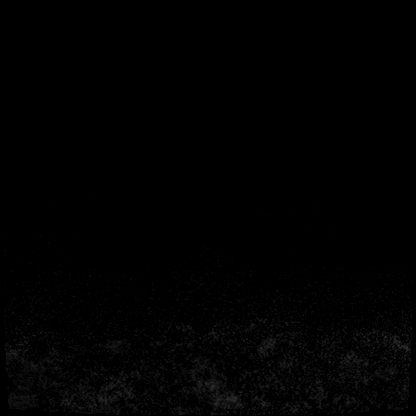

[Series 7: T1 · axial · 1.6mm · 0.77mm/px · z∈[-112,+78]mm · 3 of 120 slices shown (1 of 4)]
[im 1/120]
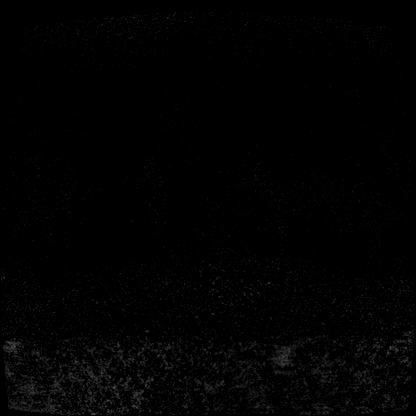
[im 60/120]
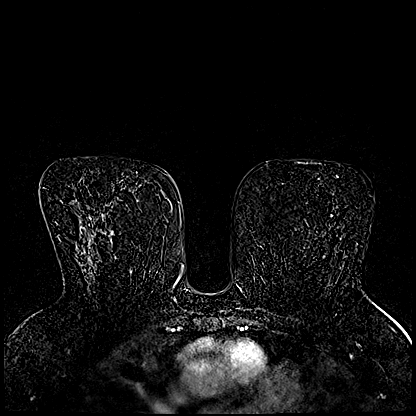
[im 120/120]
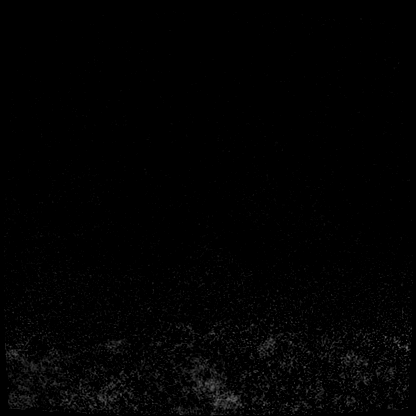

[Series 9: T1 · axial · 192.0mm · 0.77mm/px · 1 of 2 slices shown (2 of 4)]
[im 1/2]
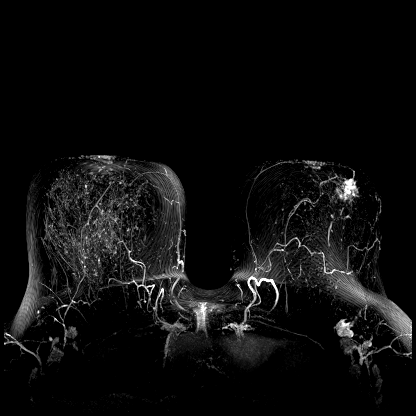

[Series 10: T1 fat-sat · axial · 1.6mm · 0.77mm/px · z∈[-112,+78]mm · 4 of 120 slices shown (4 of 5)]
[im 1/120]
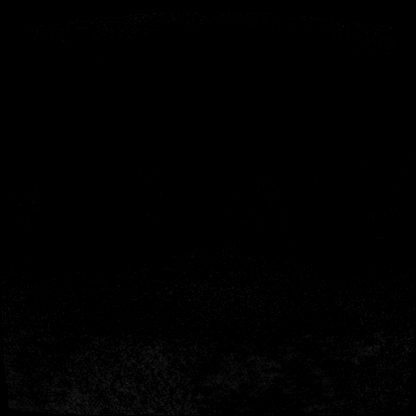
[im 40/120]
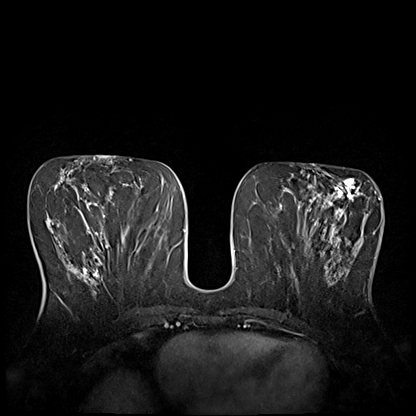
[im 80/120]
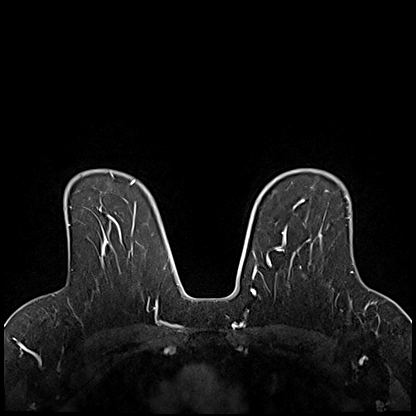
[im 120/120]
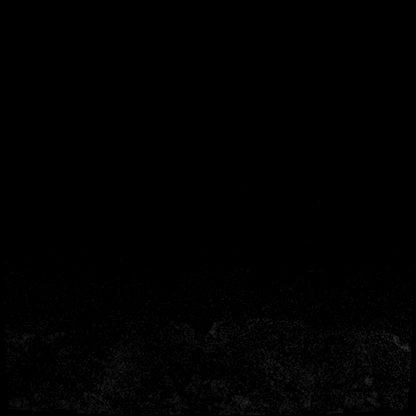

[Series 11: T1 · axial · 1.6mm · 0.77mm/px · z∈[-112,+78]mm · 4 of 120 slices shown (3 of 4)]
[im 1/120]
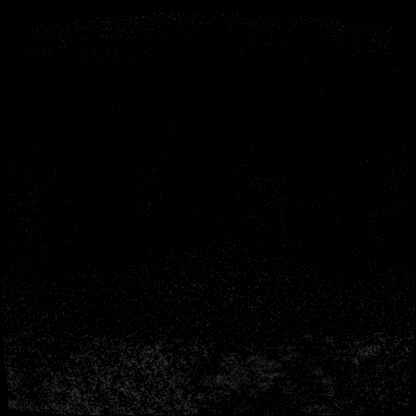
[im 40/120]
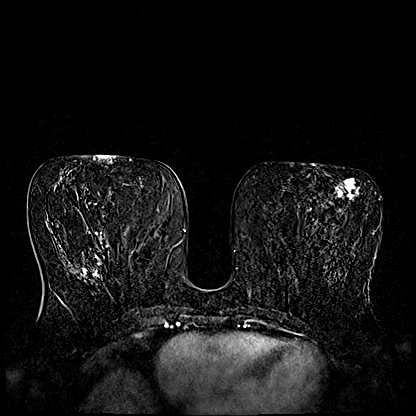
[im 80/120]
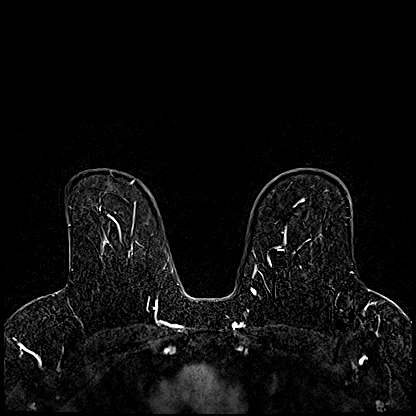
[im 120/120]
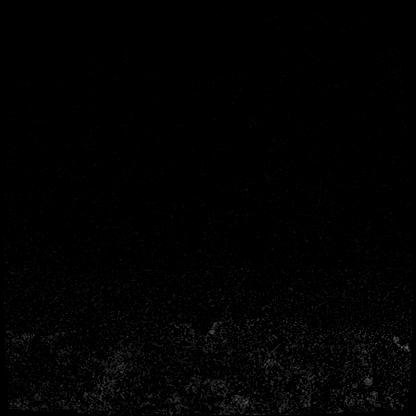

[Series 12: T1 fat-sat · axial · 1.6mm · 0.77mm/px · z∈[-112,+78]mm · 4 of 120 slices shown (5 of 5)]
[im 1/120]
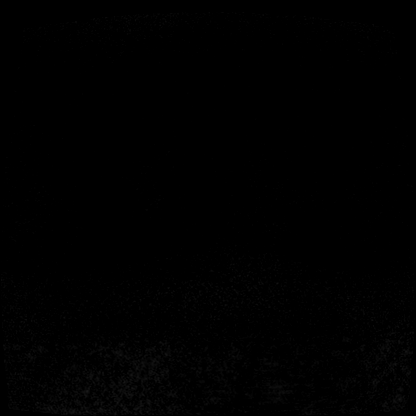
[im 40/120]
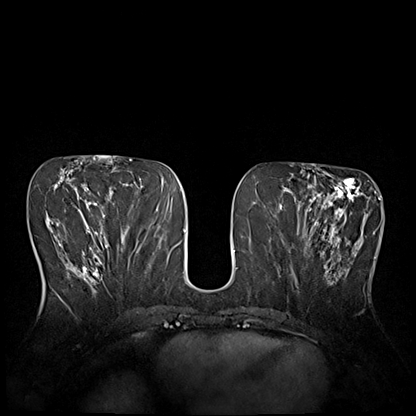
[im 80/120]
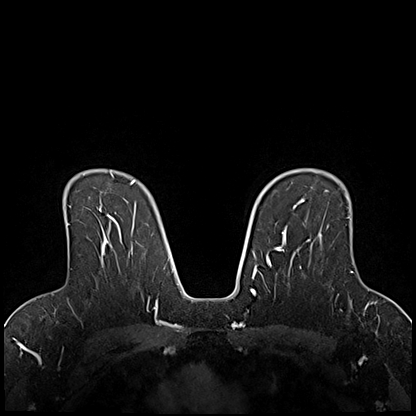
[im 120/120]
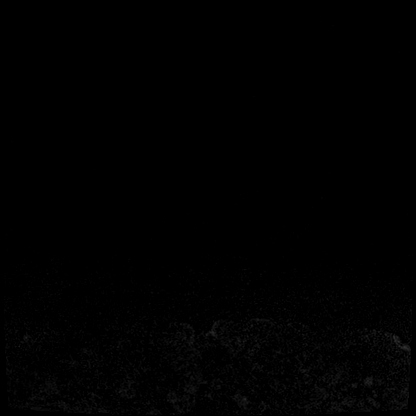

[Series 13: T1 · axial · 1.6mm · 0.77mm/px · 1 of 120 slices shown (4 of 4)]
[im 1/120]
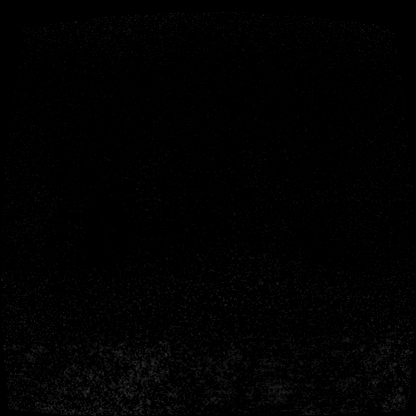

[28 of 48 positions shown; findings below may reference images not displayed]

Three-dimensional MR images were rendered by post-processing of the
original MR data on an independent workstation. The
three-dimensional MR images were interpreted, and findings are
reported in the following complete MRI report for this study. Three
dimensional images were evaluated at the independent interpreting
workstation using the DynaCAD thin client.
FINDINGS: Breast composition: b. Scattered fibroglandular tissue.

Background parenchymal enhancement: Mild

Right breast: No mass or abnormal enhancement.

Left breast: There is a mass with associated non mass enhancement in
the left breast, anterior lower outer quadrant, with apparent
associated architectural distortion, reflecting the recently
biopsied carcinoma. There is susceptibility artifact near the center
of the abnormal enhancement reflecting the post biopsy marker clip.
The mass and non mass enhancement spans 1.9 x 1.8 x 1.9 cm.

Lymph nodes: Enlarged left axillary lymph node noted associated with
post biopsy marker clip susceptibility artifact. A second prominent
lymph node lies posterior and inferior to the biopsied node, 9 mm in
short axis with cortical thickening of approximately 5 mm. No other
enlarged/abnormal lymph nodes.

Ancillary findings:  None.
IMPRESSION: 1. Recently biopsy-proven malignancy in the anterior, lower outer
left breast, 1.9 x 1.8 x 1.9 cm.
2. No other evidence of left breast malignancy.
3. No evidence of right breast malignancy.
4. Enlarged, biopsy-proven metastatic left axillary lymph node.
Adjacent smaller but abnormal appearing lymph node also noted in the
left axilla. No other evidence of metastatic lymphadenopathy.

RECOMMENDATION:
1. Treatment as planned for the known left breast carcinoma and
metastatic left axillary lymphadenopathy.

BI-RADS CATEGORY  6: Known biopsy-proven malignancy.

## 2020-08-02 MED ORDER — GADOBUTROL 1 MMOL/ML IV SOLN
10.0000 mL | Freq: Once | INTRAVENOUS | Status: AC | PRN
  Administered 2020-08-02: 10 mL via INTRAVENOUS

## 2020-08-02 NOTE — Progress Notes (Signed)
Nutrition  Patient identified by attending Breast Clinic on 07/26/2020.  Patient was given nutrition packet by nurse navigator with RD contact information.   54 year old female with new diagnosis of breast cancer.  Planning lumpectomy, mammaprint, radiation and antiestrogens.    Chart reviewed.   Ht: 66.5 inches Wt: 219 lb BMI 34  Patient currently not at nutritional risk.  Please consult RD if nutritional changes occur.  Coburn Knaus B. Zenia Resides, [place], [place] Registered Dietitian [phone] (mobile)

## 2020-08-03 ENCOUNTER — Ambulatory Visit (HOSPITAL_COMMUNITY): Payer: 59

## 2020-08-03 ENCOUNTER — Encounter: Payer: Self-pay | Admitting: *Deleted

## 2020-08-03 ENCOUNTER — Other Ambulatory Visit: Payer: Self-pay | Admitting: Surgery

## 2020-08-03 DIAGNOSIS — C50912 Malignant neoplasm of unspecified site of left female breast: Secondary | ICD-10-CM

## 2020-08-07 ENCOUNTER — Telehealth: Payer: Self-pay | Admitting: *Deleted

## 2020-08-07 ENCOUNTER — Other Ambulatory Visit: Payer: Self-pay | Admitting: Surgery

## 2020-08-07 DIAGNOSIS — C50912 Malignant neoplasm of unspecified site of left female breast: Secondary | ICD-10-CM

## 2020-08-07 NOTE — Telephone Encounter (Signed)
Received call from pt stating she is scheduled for lumpectomy on 08/16/20 and requesting MD advice on when to stop tamoxifen.  Per MD pt to stop taking tamoxifen 1 week prior to surgery and to resume 1 week post surgery.  Pt verbalized understanding and appreciative of the advice.

## 2020-08-08 ENCOUNTER — Encounter: Payer: Self-pay | Admitting: *Deleted

## 2020-08-10 ENCOUNTER — Telehealth: Payer: Self-pay | Admitting: Hematology and Oncology

## 2020-08-10 NOTE — Telephone Encounter (Signed)
Scheduled appt per 9/14 - mailed letter with appt date and time

## 2020-08-11 ENCOUNTER — Other Ambulatory Visit: Payer: Self-pay | Admitting: *Deleted

## 2020-08-11 ENCOUNTER — Telehealth: Payer: Self-pay | Admitting: *Deleted

## 2020-08-11 ENCOUNTER — Other Ambulatory Visit: Payer: Self-pay | Admitting: Family

## 2020-08-11 ENCOUNTER — Telehealth: Payer: Self-pay

## 2020-08-11 ENCOUNTER — Other Ambulatory Visit: Payer: Self-pay

## 2020-08-11 DIAGNOSIS — F4322 Adjustment disorder with anxiety: Secondary | ICD-10-CM

## 2020-08-11 MED ORDER — ALPRAZOLAM 0.25 MG PO TABS: 0.2500 mg | ORAL_TABLET | Freq: Two times a day (BID) | ORAL | 1 refills | Status: DC | PRN

## 2020-08-11 MED ORDER — ALPRAZOLAM 0.25 MG PO TABS: 0.2500 mg | ORAL_TABLET | Freq: Two times a day (BID) | ORAL | 0 refills | Status: DC | PRN

## 2020-08-11 NOTE — Telephone Encounter (Signed)
(  9/22)This RN spoke with pt per her call stating need for prescription of xanax called to her local phx " Dr Lindi Adie said he would refill my xanax when I ran out of my current bottle."  Note pt has 3 days left of current medication.  She states she is preparing for surgery(9/22) and wants to make sure she has prescription medications available.  This RN reassured her medication will be refilled as per VG's dictation.  Last fill of 30 tabs was 07/24/2020.  Pharmacy verified and phoned in by this RN.

## 2020-08-11 NOTE — Progress Notes (Signed)
Patient said to disregard refill. She had another provider call it in or her.

## 2020-08-11 NOTE — Progress Notes (Signed)
Your procedure is scheduled on Wednesday, September 22nd.  Report to [facility] Main Entrance "A" at 6:30 A.M., and check in at the Admitting office.  Call this number if you have problems the morning of surgery:  [phone]  Call [phone] if you have any questions prior to your surgery date Monday-Friday 8am-4pm   Remember:  Do not eat after midnight the night before your surgery  You may drink clear liquids until 5:30 A.M. the morning of your surgery.   Clear liquids allowed are: Water, Non-Citrus Juices (without pulp), Carbonated Beverages, Clear Tea, Black Coffee Only, and Gatorade   Please complete your PRE-SURGERY ENSURE that was provided to you by 5:30 A.M. the morning of surgery.  Please, if able, drink it in one setting. DO NOT SIP.   Take these medicines the morning of surgery with A SIP OF WATER   If needed: ALPRAZolam Duanne Moron)   As of today, STOP taking any Aspirin (unless otherwise instructed by your surgeon) Aleve, Naproxen, Ibuprofen, Motrin, Advil, Goody's, BC's, all herbal medications, fish oil, and all vitamins.                     Do not wear jewelry, make up, or nail polish            Do not wear lotions, powders, perfumes, or deodorant.            Do not shave 48 hours prior to surgery.             Do not bring valuables to the hospital.            [facility] is not responsible for any belongings or valuables.  Do NOT Smoke (Tobacco/Vaping) or drink Alcohol 24 hours prior to your procedure If you use a CPAP at night, you may bring all equipment for your overnight stay.   Contacts, glasses, dentures or bridgework may not be worn into surgery.      For patients admitted to the hospital, discharge time will be determined by your treatment team.   Patients discharged the day of surgery will not be allowed to drive home, and someone needs to stay with them for 24 hours.  Special instructions:   [place]- Preparing For Surgery  Before surgery, you can  play an important role. Because skin is not sterile, your skin needs to be as free of germs as possible. You can reduce the number of germs on your skin by washing with CHG (chlorahexidine gluconate) Soap before surgery.  CHG is an antiseptic cleaner which kills germs and bonds with the skin to continue killing germs even after washing.    Oral Hygiene is also important to reduce your risk of infection.  Remember - BRUSH YOUR TEETH THE MORNING OF SURGERY WITH YOUR REGULAR TOOTHPASTE  Please do not use if you have an allergy to CHG or antibacterial soaps. If your skin becomes reddened/irritated stop using the CHG.  Do not shave (including legs and underarms) for at least 48 hours prior to first CHG shower. It is OK to shave your face.  Please follow these instructions carefully.   1. Shower the NIGHT BEFORE SURGERY and the MORNING OF SURGERY with CHG Soap.   2. If you chose to wash your hair, wash your hair first as usual with your normal shampoo.  3. After you shampoo, rinse your hair and body thoroughly to remove the shampoo.  4. Use CHG as you would any other liquid soap.  You can apply CHG directly to the skin and wash gently with a scrungie or a clean washcloth.   5. Apply the CHG Soap to your body ONLY FROM THE NECK DOWN.  Do not use on open wounds or open sores. Avoid contact with your eyes, ears, mouth and genitals (private parts). Wash Face and genitals (private parts)  with your normal soap.   6. Wash thoroughly, paying special attention to the area where your surgery will be performed.  7. Thoroughly rinse your body with warm water from the neck down.  8. DO NOT shower/wash with your normal soap after using and rinsing off the CHG Soap.  9. Pat yourself dry with a CLEAN TOWEL.  10. Wear CLEAN PAJAMAS to bed the night before surgery  11. Place CLEAN SHEETS on your bed the night of your first shower and DO NOT SLEEP WITH PETS.  Day of Surgery: Wear Clean/Comfortable clothing  the morning of surgery Do not apply any deodorants/lotions.   Remember to brush your teeth WITH YOUR REGULAR TOOTHPASTE.   Please read over the following fact sheets that you were given.

## 2020-08-12 ENCOUNTER — Other Ambulatory Visit (HOSPITAL_COMMUNITY)
Admission: RE | Admit: 2020-08-12 | Discharge: 2020-08-12 | Disposition: A | Discharge status: Home / Self Care | Payer: 59 | Source: Ambulatory Visit | Attending: Surgery | Admitting: Surgery

## 2020-08-12 DIAGNOSIS — Z01812 Encounter for preprocedural laboratory examination: Secondary | ICD-10-CM | POA: Diagnosis not present

## 2020-08-12 DIAGNOSIS — Z20822 Contact with and (suspected) exposure to covid-19: Secondary | ICD-10-CM | POA: Insufficient documentation

## 2020-08-12 LAB — SARS CORONAVIRUS 2 (TAT 6-24 HRS): SARS Coronavirus 2: NEGATIVE

## 2020-08-14 ENCOUNTER — Encounter (HOSPITAL_COMMUNITY): Payer: Self-pay

## 2020-08-14 ENCOUNTER — Encounter (HOSPITAL_COMMUNITY)
Admission: RE | Admit: 2020-08-14 | Discharge: 2020-08-14 | Disposition: A | Discharge status: Home / Self Care | Payer: 59 | Source: Ambulatory Visit | Attending: Surgery | Admitting: Surgery

## 2020-08-14 ENCOUNTER — Other Ambulatory Visit: Payer: Self-pay

## 2020-08-14 ENCOUNTER — Encounter: Payer: Self-pay | Admitting: *Deleted

## 2020-08-14 DIAGNOSIS — Z01812 Encounter for preprocedural laboratory examination: Secondary | ICD-10-CM | POA: Diagnosis not present

## 2020-08-14 HISTORY — DX: Anxiety disorder, unspecified: F41.9

## 2020-08-14 LAB — BASIC METABOLIC PANEL
Anion gap: 10 (ref 5–15)
BUN: 17 mg/dL (ref 6–20)
CO2: 25 mmol/L (ref 22–32)
Calcium: 12.6 mg/dL — ABNORMAL HIGH (ref 8.9–10.3)
Chloride: 103 mmol/L (ref 98–111)
Creatinine, Ser: 1.04 mg/dL — ABNORMAL HIGH (ref 0.44–1.00)
GFR calc Af Amer: >60 mL/min (ref >60)
GFR calc non Af Amer: >60 mL/min (ref >60)
Glucose, Bld: 117 mg/dL — ABNORMAL HIGH (ref 70–99)
Potassium: 4 mmol/L (ref 3.5–5.1)
Sodium: 138 mmol/L (ref 135–145)

## 2020-08-14 LAB — CBC
HCT: 42.7 % (ref 36.0–46.0)
Hemoglobin: 13.9 g/dL (ref 12.0–15.0)
MCH: 30.7 pg (ref 26.0–34.0)
MCHC: 32.6 g/dL (ref 30.0–36.0)
MCV: 94.3 fL (ref 80.0–100.0)
Platelets: 320 10*3/uL (ref 150–400)
RBC: 4.53 MIL/uL (ref 3.87–5.11)
RDW: 12.2 % (ref 11.5–15.5)
WBC: 9.6 10*3/uL (ref 4.0–10.5)
nRBC: 0 % (ref 0.0–0.2)

## 2020-08-14 LAB — PREGNANCY, URINE: Preg Test, Ur: NEGATIVE

## 2020-08-14 NOTE — Progress Notes (Signed)
PCP - Roma Kayser Cardiologist - denies  Chest x-ray - denies EKG - denies Stress Test - denies ECHO - denies Cardiac Cath - denies  Blood Thinner Instructions: n/a Aspirin Instructions: ASA stopped 08/07/20 took occasionally  ERAS Protcol - Clears until 0530 PRE-SURGERY Ensure or G2- Ensure given  COVID TEST- Saturday Negative 08/12/20   Anesthesia review: NO  Patient denies shortness of breath, fever, cough and chest pain at PAT appointment   All instructions explained to the patient, with a verbal understanding of the material. Patient agrees to go over the instructions while at home for a better understanding. Patient also instructed to self quarantine after being tested for COVID-19. The opportunity to ask questions was provided.

## 2020-08-15 ENCOUNTER — Other Ambulatory Visit: Payer: Self-pay | Admitting: Surgery

## 2020-08-15 ENCOUNTER — Ambulatory Visit
Admission: RE | Admit: 2020-08-15 | Discharge: 2020-08-15 | Disposition: A | Discharge status: Home / Self Care | Payer: 59 | Source: Ambulatory Visit | Attending: Surgery | Admitting: Surgery

## 2020-08-15 DIAGNOSIS — C50912 Malignant neoplasm of unspecified site of left female breast: Secondary | ICD-10-CM

## 2020-08-15 IMAGING — MG MM PLC BREAST LOC DEV 1ST LESION INC MAMMO GUIDE*L*
8 of 11 series · 8 of 15 positions shown · non-contrast
Comparison: Previous exam(s).

CLINICAL DATA: Patient presents for seed localization of LEFT
breast lesion prior to LEFT lumpectomy.

EXAM:
MAMMOGRAPHIC GUIDED RADIOACTIVE SEED LOCALIZATION OF THE LEFT BREAST

[L CC (1 of 4)]
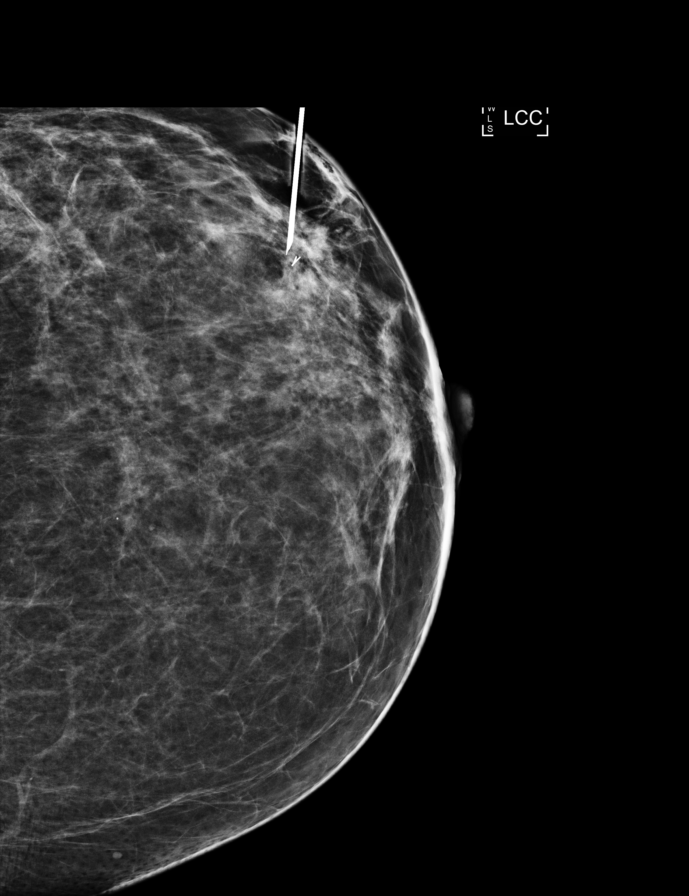

[L CC (2 of 4)]
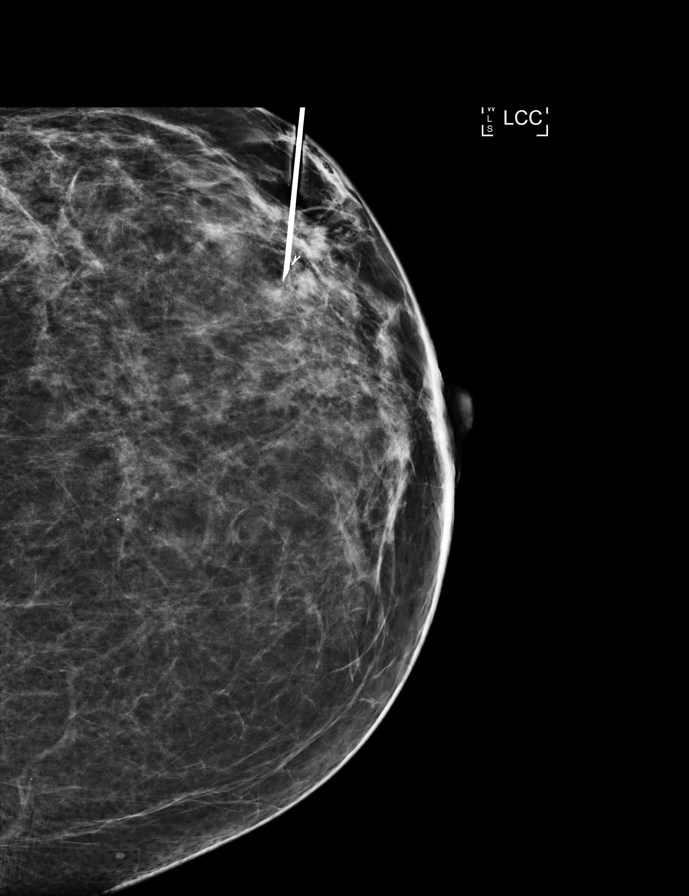

[L CC (3 of 4)]
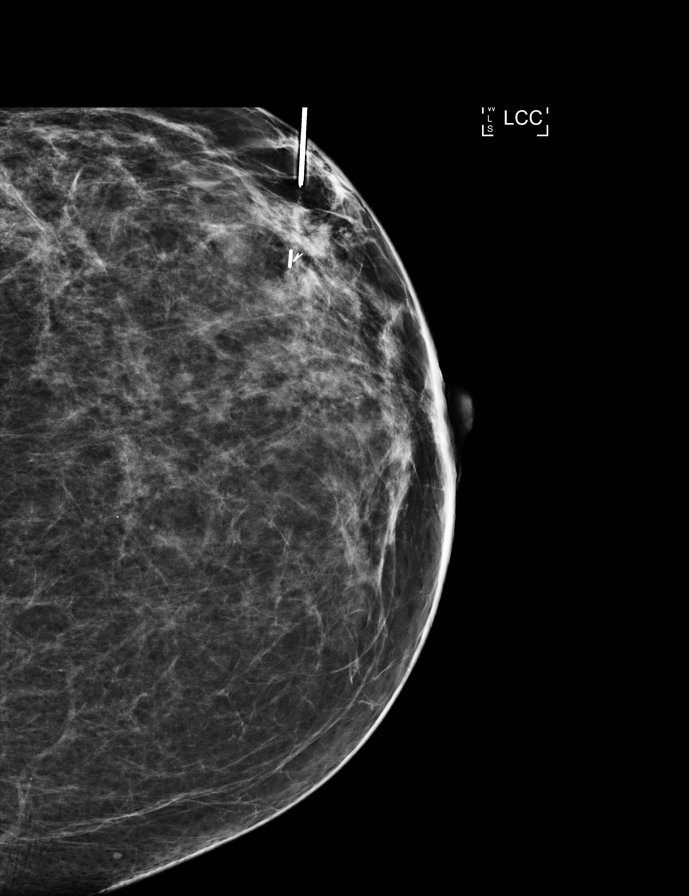

[L CC (4 of 4)]
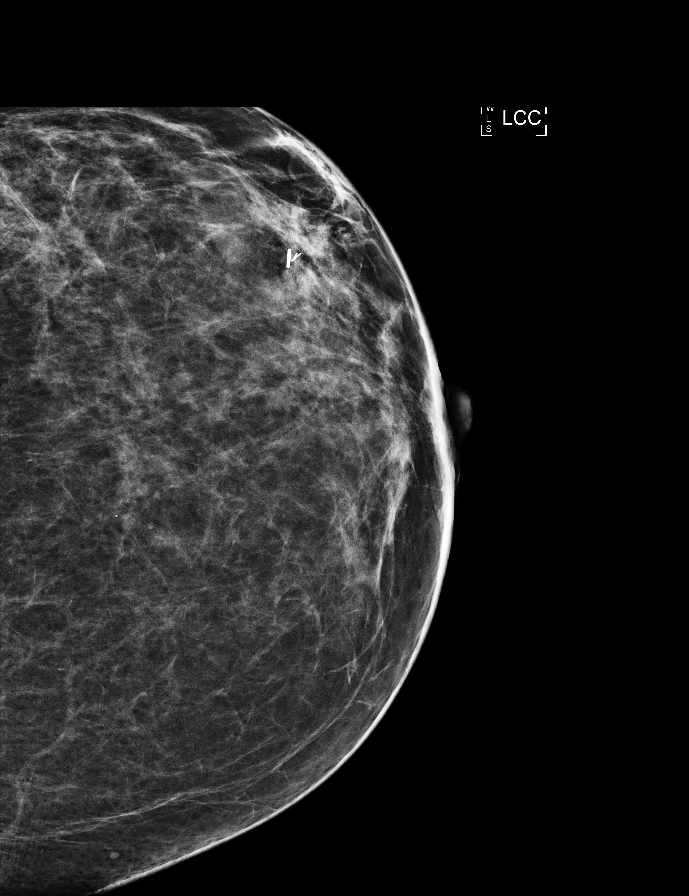

[L LM (1 of 4)]
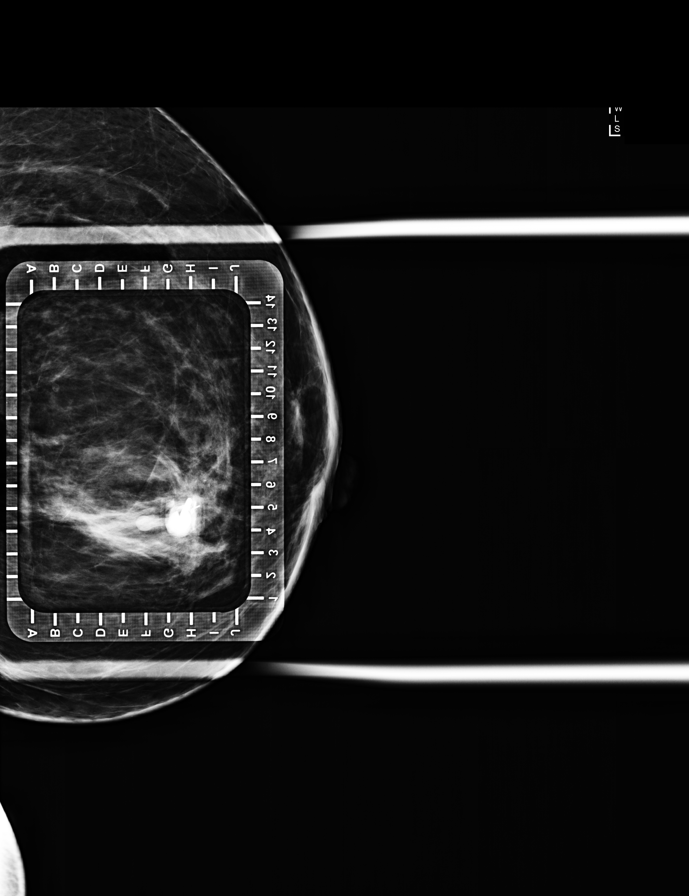

[L LM (2 of 4)]
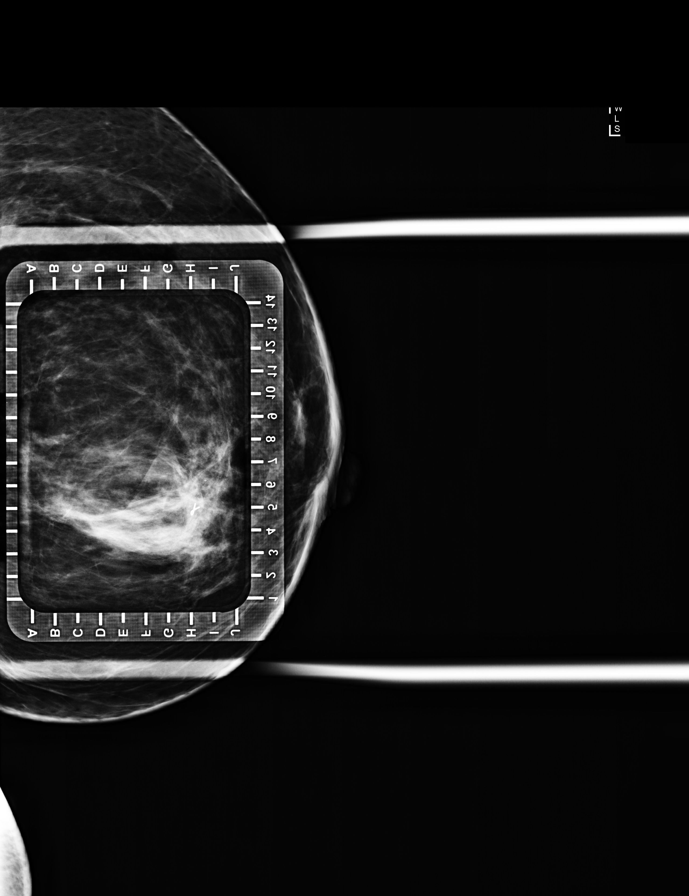

[L LM (3 of 4)]
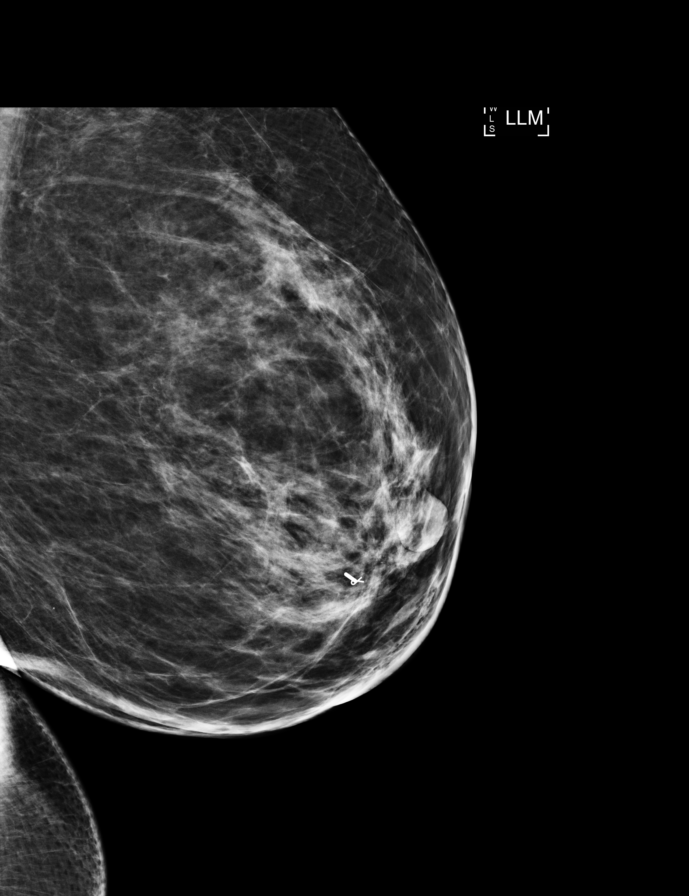

[L LM (4 of 4)]
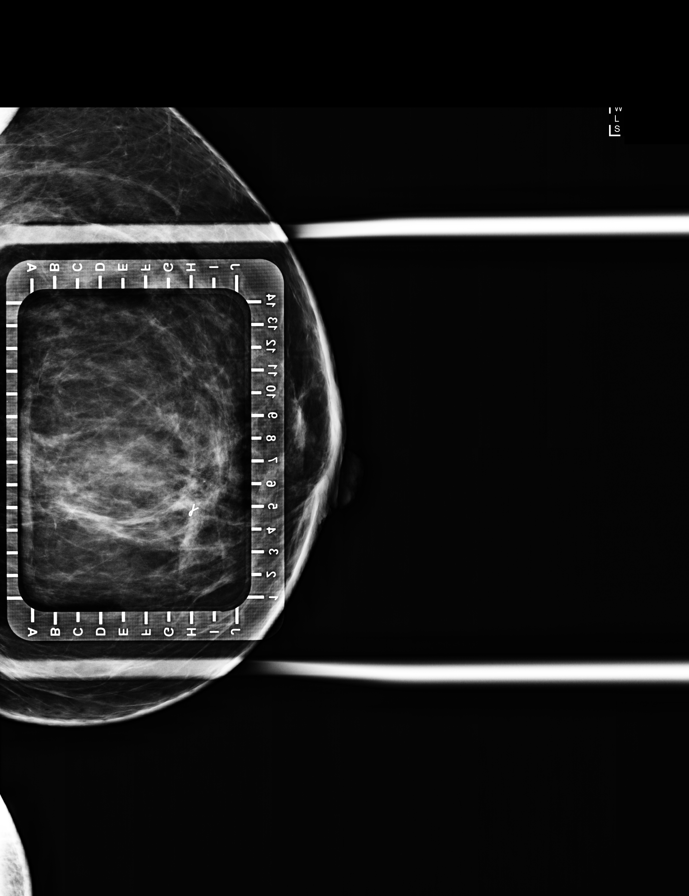

[8 of 15 positions shown; findings below may reference images not displayed]

FINDINGS: Patient presents for radioactive seed localization prior to
lumpectomy. I met with the patient and we discussed the procedure of
seed localization including benefits and alternatives. We discussed
the high likelihood of a successful procedure. We discussed the
risks of the procedure including infection, bleeding, tissue injury
and further surgery. We discussed the low dose of radioactivity
involved in the procedure. Informed, written consent was given.

The usual time-out protocol was performed immediately prior to the
procedure.

Using mammographic guidance, sterile technique, 1% lidocaine and an
[I5] radioactive seed, the ribbon shaped clip in the LATERAL
portion of the LEFT breast was localized using a LATERAL to MEDIAL
approach. The follow-up mammogram images confirm the seed in the
expected location and were marked for Dr. WOKS NATH.

Spot compression view of the LEFT axilla confirms radioactive seed
adjacent to Q-shaped clip within the LEFT axilla following
ultrasound-guided seed placement.

Follow-up survey of the patient confirms presence of the radioactive
seed.

Order number of [I5] seed:  [PHONE_NUMBER].

Total activity:  0.249 millicuries reference Date: [DATE]

The patient tolerated the procedure well and was released from the
[REDACTED]. She was given instructions regarding seed removal.
IMPRESSION: Radioactive seed localization LEFT breast. No apparent
complications.

## 2020-08-15 IMAGING — US US PLC BREAST LOC DEV 1ST LESION INC US GUIDE*L*
1 series · 4 of 4 positions shown · non-contrast
Comparison: Previous exam(s).

CLINICAL DATA: Patient presents for ultrasound-guided placement of
radioactive seed in the LEFT axilla.

EXAM:
ULTRASOUND GUIDED RADIOACTIVE SEED LOCALIZATION OF THE LEFT AXILLA

[Series 1: us plc breast loc dev 1st lesion inc us guide*left · 0.07mm/px · 4 of 4 slices shown]
[im 1/4]
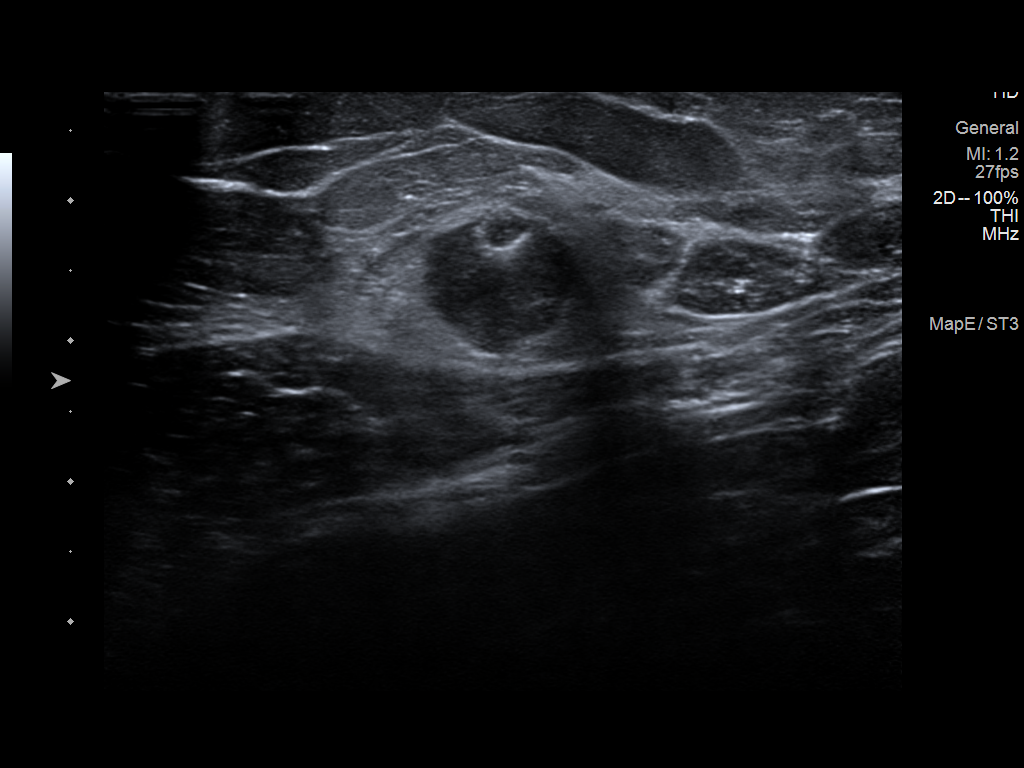
[im 2/4]
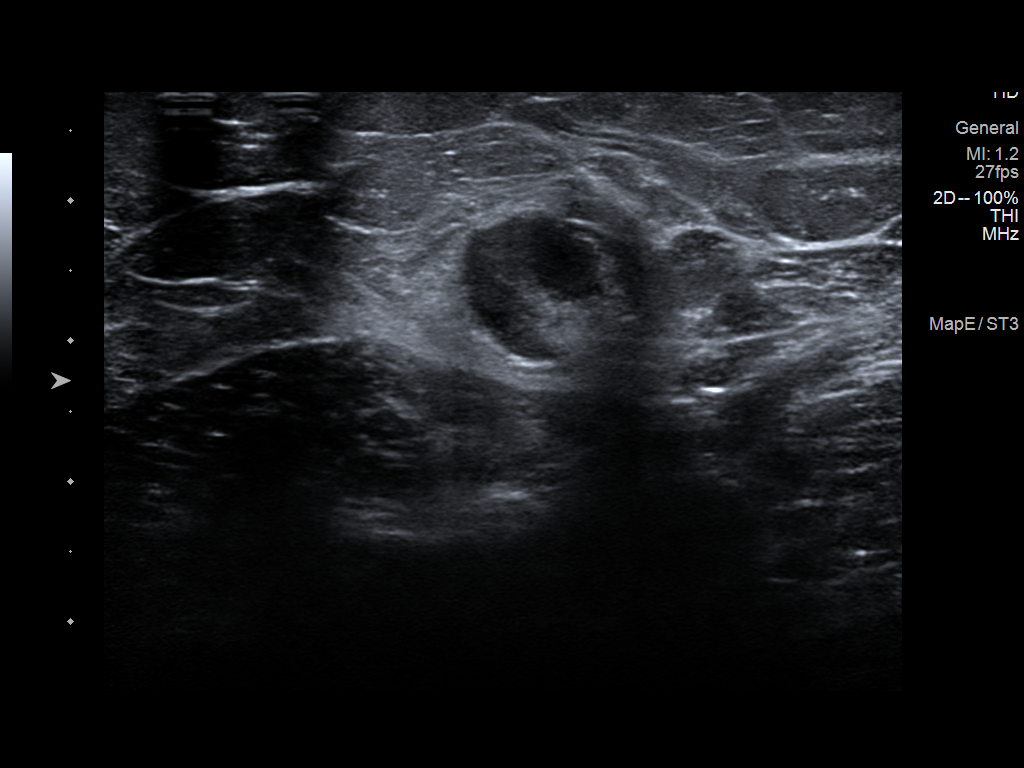
[im 3/4]
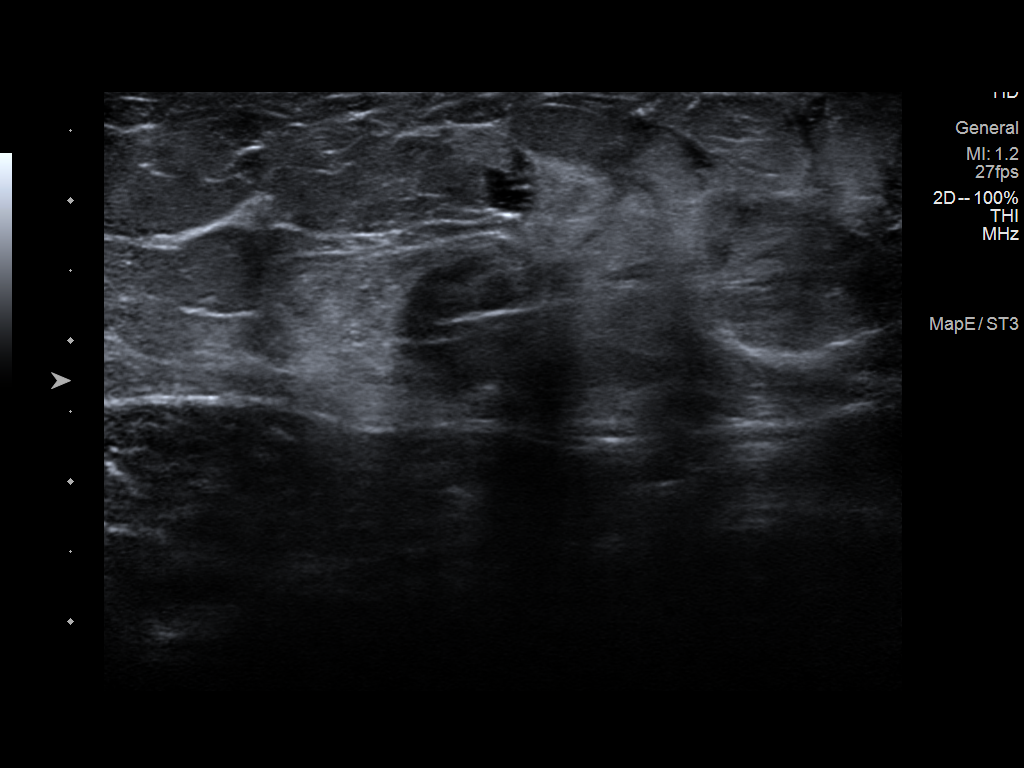
[im 4/4]
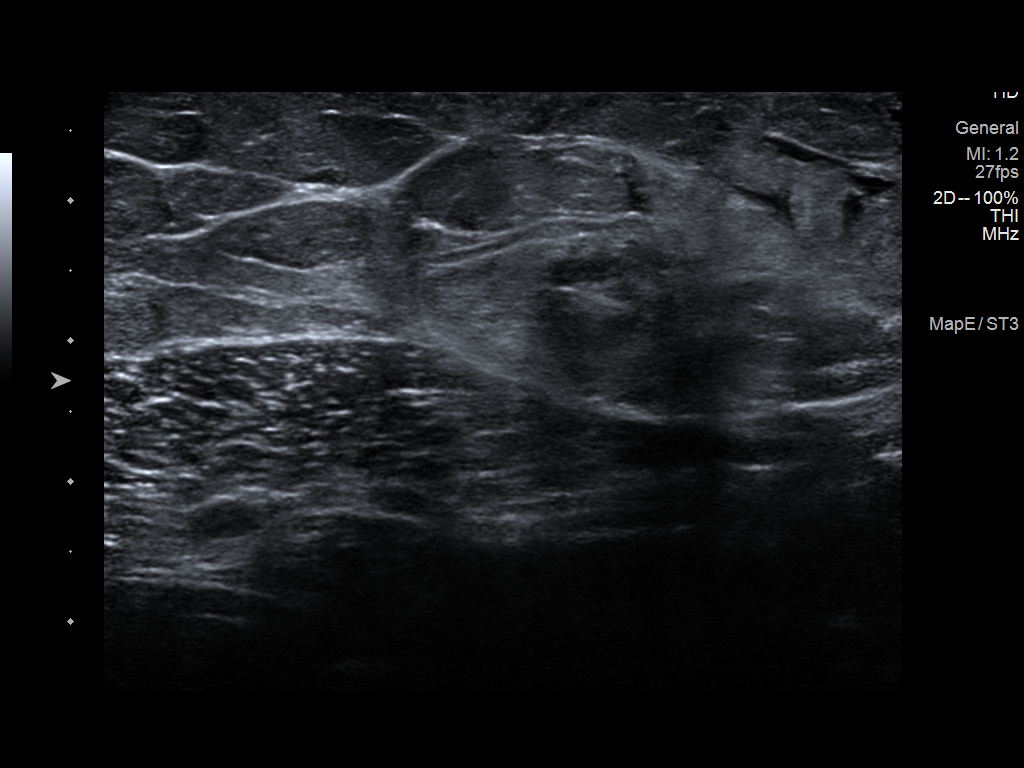

[4 of 4 positions shown; findings below may reference images not displayed]

FINDINGS: Patient presents for radioactive seed localization prior to targeted
axillary dissection. I met with the patient and we discussed the
procedure of seed localization including benefits and alternatives.
We discussed the high likelihood of a successful procedure. We
discussed the risks of the procedure including infection, bleeding,
tissue injury and further surgery. We discussed the low dose of
radioactivity involved in the procedure. Informed, written consent
was given.

The usual time-out protocol was performed immediately prior to the
procedure.

Using ultrasound guidance, sterile technique, 1% lidocaine and an
[17] radioactive seed, Q shaped clip in the LEFT axillary lymph
node was localized using a LATERAL to MEDIAL approach. The follow-up
mammogram images confirm the seed in the expected location and were
marked for Dr. AREND.

Follow-up survey of the patient confirms presence of the radioactive
seed.

Order number of [17] seed:  [PHONE_NUMBER].

Total activity:  0.249 millicuries reference Date: [DATE]

The patient tolerated the procedure well and was released from the
[REDACTED]. She was given instructions regarding seed removal.
IMPRESSION: Radioactive seed localization left axilla. No apparent
complications.

## 2020-08-15 NOTE — H&P (Signed)
Lindsay Hampton  Location: [facility] Patient #: [id number] DOB: [birth year] Undefined / Language: Lindsay Hampton / Race: White Female  History of Present Illness   The patient is a 54 year old female who presents with a complaint of breast cancer.  The PCP is [name], [facility])  The patient is at the [facility] - Oncology is Drs. Lindi Adie and [place]  She is accompanied by husband, [name]  She went for her regular mammogram. She has never had any prior breast problems. She is still having a period.  Mammograms: [facility] - 07/12/2020 - 2.4 x 1.8 x 2.2 cm mass in the left breast. there is a single abnormal left axillary lymph node that has cortical thickness of 0.6 cm. Radiology recommended MRI for extent of disease and given the ill defined margins of the mass. Biopsy: Left breast biopsy 3:30 o'clock on 07/17/2020 ([id number])- IDC, grade 2, ER - 95%, PR - 40%, Ki67 - 15%, Her2Neu - negative, Left axillary node - POSITIVE Family history of breast or ovarian cancer: Her mother had a lumpectomy and rad tx at age 13 - she is alive and doing well. She has a cousin on her dad's side who has breast cancer On hormone therapy: Still having periods  I discussed the options for breast cancer treatment with the patient. The patient is at the [facility], which includes medical oncology and radiation oncology. I discussed the surgical options of lumpectomy vs. mastectomy. If mastectomy, there is the possibility of reconstruction. I discussed the options of lymph node biopsy. The treatment plan depends on the pathologic staging of the tumor and the patient's personal wishes. The risks of surgery include, but are not limited to, bleeding, infection, the need for further surgery, and nerve injury. The patient has been given literature on the treatment of breast cancer.  Plan: 1. MRI, 2. If the tumor is the same size or  smaller on MRI - will proceed with left breast lumpectomy/left axillary targeted node dissection, 3. It the tumor is considerably larger or multiple, would consider Mammoprint and neoadjuvant therapy, 4. Mammoprint, 5. Radiation tx, 6. Antihormone therapy  Past Medical History: 1. Colonoscopy - 2019 - [place] 2. She has the Covid vaccine  Social History: She is accompanied by husband, [name]. He is the son of Al Straka She has 2 children - Lindsay Hampton - 84 yo in [place], and Lindsay Hampton - 53 yo, a hotel employee She is not working  Medication History Conni Slipper, RN; 07/26/2020 7:55 AM) Medications Reconciled   Physical Exam  General: Overweight WF who is alert and generally healthy appearing. She is wearing a mask. HEENT: Normal. Pupils equal.  Neck: Supple. No mass. No thyroid mass. Lymph Nodes: No supraclavicular or cervical nodes.  Lungs: Clear to auscultation and symmetric breath sounds. Heart: RRR. No murmur or rub.  Breast: right - no mass or nodule  Left - she has band aids/bruise at 4:30 o'clock about 5 cm off the areola. I am not sure I feel a mass, which is interesting as big as this looks on [place].  Abdomen: Soft. No mass. No tenderness. No hernia. Normal bowel sounds. Rectal: Not done.  Extremities: Good strength and ROM in upper and lower extremities.  Neurologic: Grossly intact to motor and sensory function. Psychiatric: Has normal mood and affect. Behavior is normal.  Assessment & Plan  1.  BREAST CANCER, STAGE 2, LEFT (E31.540) Story: Left breast biopsy 3:30 o'clock on 07/17/2020 ([id number])- IDC, grade 2,  ER - 95%, PR - 40%, Ki67 - 15%, Her2Neu - negative, Left axillary node - POSITIVE  Oncology - [place]  Plan:  1. MRI,   Breast MRI - 08/02/2020 - 1. Recently biopsy-proven malignancy in the anterior, lower outer left breast, 1.9 x 1.8 x 1.9 cm.   2. No other evidence of left breast malignancy.   3. No evidence of right breast  malignancy.   4. Enlarged, biopsy-proven metastatic left axillary lymph node. Adjacent smaller but abnormal appearing lymph node also noted in the left axilla. No other evidence of metastatic lymphadenopathy.   2. If the tumor is the same size or smaller on MRI - we will proceed with left breast lumpectomy/left axillary targeted node dissection, (the final specimen will a Mammoprint)  3.    4. Mammoprint either way  5. Radiation tx,  6. Antihormone therapy  2.  She had some stomach upset on 9/21  Pt called back and reports, she is "feeling fine now." She took some TUMS and is "fine." Pt requested that Dr [name] be notified of this. Pt thinks nausea is from her "nerves."  Thx  Larena Glassman, MD, [facility] Surgery Office phone:  [phone]

## 2020-08-15 NOTE — Anesthesia Preprocedure Evaluation (Addendum)
Anesthesia Evaluation  Patient identified by MRN, date of birth, ID band  Reviewed: Patient's Chart, lab work & pertinent test results  Airway Mallampati: II  TM Distance: >3 FB Neck ROM: Full    Dental  (+) Teeth Intact   Pulmonary neg pulmonary ROS,    Pulmonary exam normal        Cardiovascular negative cardio ROS   Rhythm:Regular Rate:Normal     Neuro/Psych Anxiety negative neurological ROS     GI/Hepatic negative GI ROS,   Endo/Other  negative endocrine ROS  Renal/GU negative Renal ROS  negative genitourinary   Musculoskeletal negative musculoskeletal ROS (+)   Abdominal Normal abdominal exam  (+)  Abdomen: soft. Bowel sounds: normal.  Peds negative pediatric ROS (+)  Hematology negative hematology ROS (+)   Anesthesia Other Findings   Reproductive/Obstetrics Left sided breast cancer                            Anesthesia Physical Anesthesia Plan  ASA: II  Anesthesia Plan: General and Regional   Post-op Pain Management:  Regional for Post-op pain   Induction: Intravenous  PONV Risk Score and Plan: 3 and Ondansetron and Dexamethasone  Airway Management Planned: Mask and LMA  Additional Equipment: None  Intra-op Plan:   Post-operative Plan: Extubation in OR  Informed Consent: I have reviewed the patients History and Physical, chart, labs and discussed the procedure including the risks, benefits and alternatives for the proposed anesthesia with the patient or authorized representative who has indicated his/her understanding and acceptance.     Dental advisory given  Plan Discussed with: CRNA and Surgeon  Anesthesia Plan Comments: (Lab Results      Component                Value               Date                      WBC                      9.6                 08/14/2020                HGB                      13.9                08/14/2020                HCT                       42.7                08/14/2020                MCV                      94.3                08/14/2020                PLT                      320  08/14/2020           Covid-19 Nucleic Acid Test Results Lab Results      Component                Value               Date                      [id number]              NEGATIVE            08/12/2020          )       Anesthesia Quick Evaluation

## 2020-08-16 ENCOUNTER — Ambulatory Visit
Admission: RE | Admit: 2020-08-16 | Discharge: 2020-08-16 | Disposition: A | Discharge status: Home / Self Care | Payer: 59 | Source: Ambulatory Visit | Attending: Surgery | Admitting: Surgery

## 2020-08-16 ENCOUNTER — Encounter (HOSPITAL_COMMUNITY)
Admission: RE | Disposition: A | Discharge status: Home / Self Care | Payer: Self-pay | Source: Home / Self Care | Attending: Surgery

## 2020-08-16 ENCOUNTER — Ambulatory Visit (HOSPITAL_COMMUNITY)
Admission: RE | Admit: 2020-08-16 | Discharge: 2020-08-16 | Disposition: A | Discharge status: Home / Self Care | Payer: 59 | Attending: Surgery | Admitting: Surgery

## 2020-08-16 ENCOUNTER — Ambulatory Visit (HOSPITAL_COMMUNITY): Payer: 59 | Admitting: Anesthesiology

## 2020-08-16 ENCOUNTER — Encounter (HOSPITAL_COMMUNITY): Payer: Self-pay | Admitting: Surgery

## 2020-08-16 ENCOUNTER — Other Ambulatory Visit: Payer: Self-pay

## 2020-08-16 ENCOUNTER — Encounter (HOSPITAL_COMMUNITY)
Admission: RE | Admit: 2020-08-16 | Discharge: 2020-08-16 | Disposition: A | Discharge status: Home / Self Care | Payer: 59 | Source: Ambulatory Visit | Attending: Surgery | Admitting: Surgery

## 2020-08-16 DIAGNOSIS — C50912 Malignant neoplasm of unspecified site of left female breast: Secondary | ICD-10-CM

## 2020-08-16 HISTORY — PX: AXILLARY LYMPH NODE DISSECTION: SHX5229

## 2020-08-16 HISTORY — PX: BREAST LUMPECTOMY WITH RADIOACTIVE SEED AND AXILLARY LYMPH NODE DISSECTION: SHX6656

## 2020-08-16 IMAGING — MG MM BREAST SURGICAL SPECIMEN
1 series · 1 of 1 positions shown · non-contrast
Comparison: Previous exam(s).

CLINICAL DATA: Surgical excision specimen mammogram following
radioactive seed localization of a left breast lesion.

EXAM:
SPECIMEN RADIOGRAPH OF THE LEFT BREAST

[L]
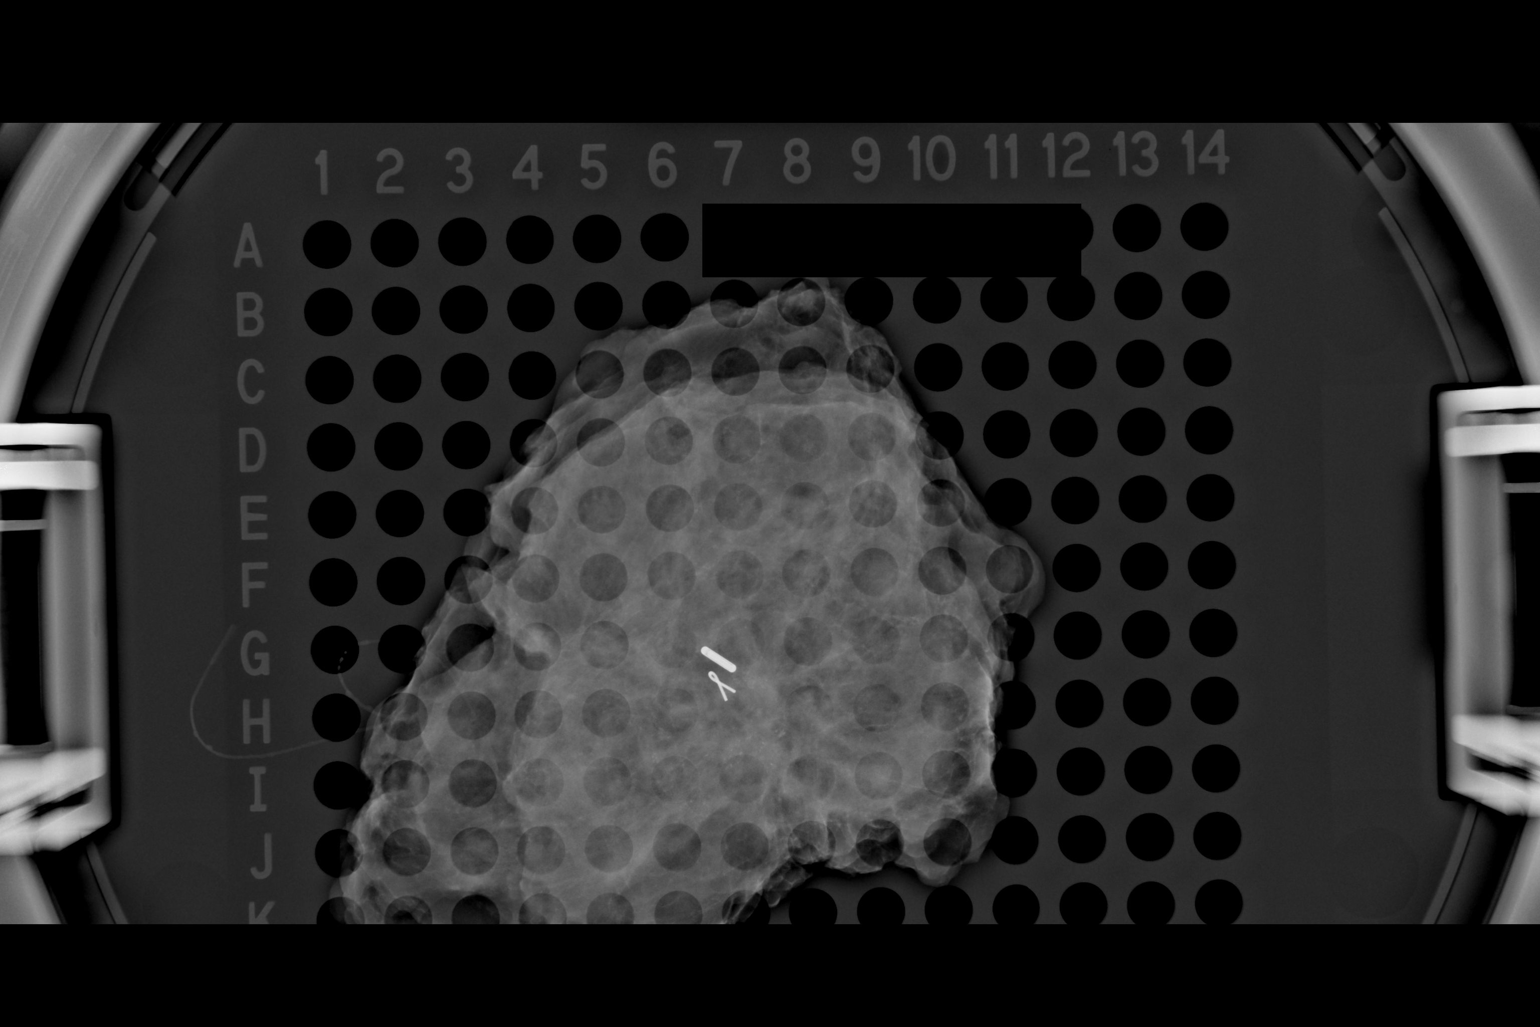

[1 of 1 positions shown; findings below may reference images not displayed]

FINDINGS: Status post excision of the left breast. The radioactive seed and
biopsy marker clip are present, completely intact, and were marked
for pathology.
IMPRESSION: Specimen radiograph of the left breast.

## 2020-08-16 IMAGING — MG MM BREAST SURGICAL SPECIMEN
1 series · 1 of 1 positions shown · non-contrast
Comparison: Previous exam(s).

CLINICAL DATA: Surgical specimen mammogram from the left axilla
following radioactive seed localization a left axillary lymph node.

EXAM:
SPECIMEN RADIOGRAPH OF THE LEFT AXILLA

[L]
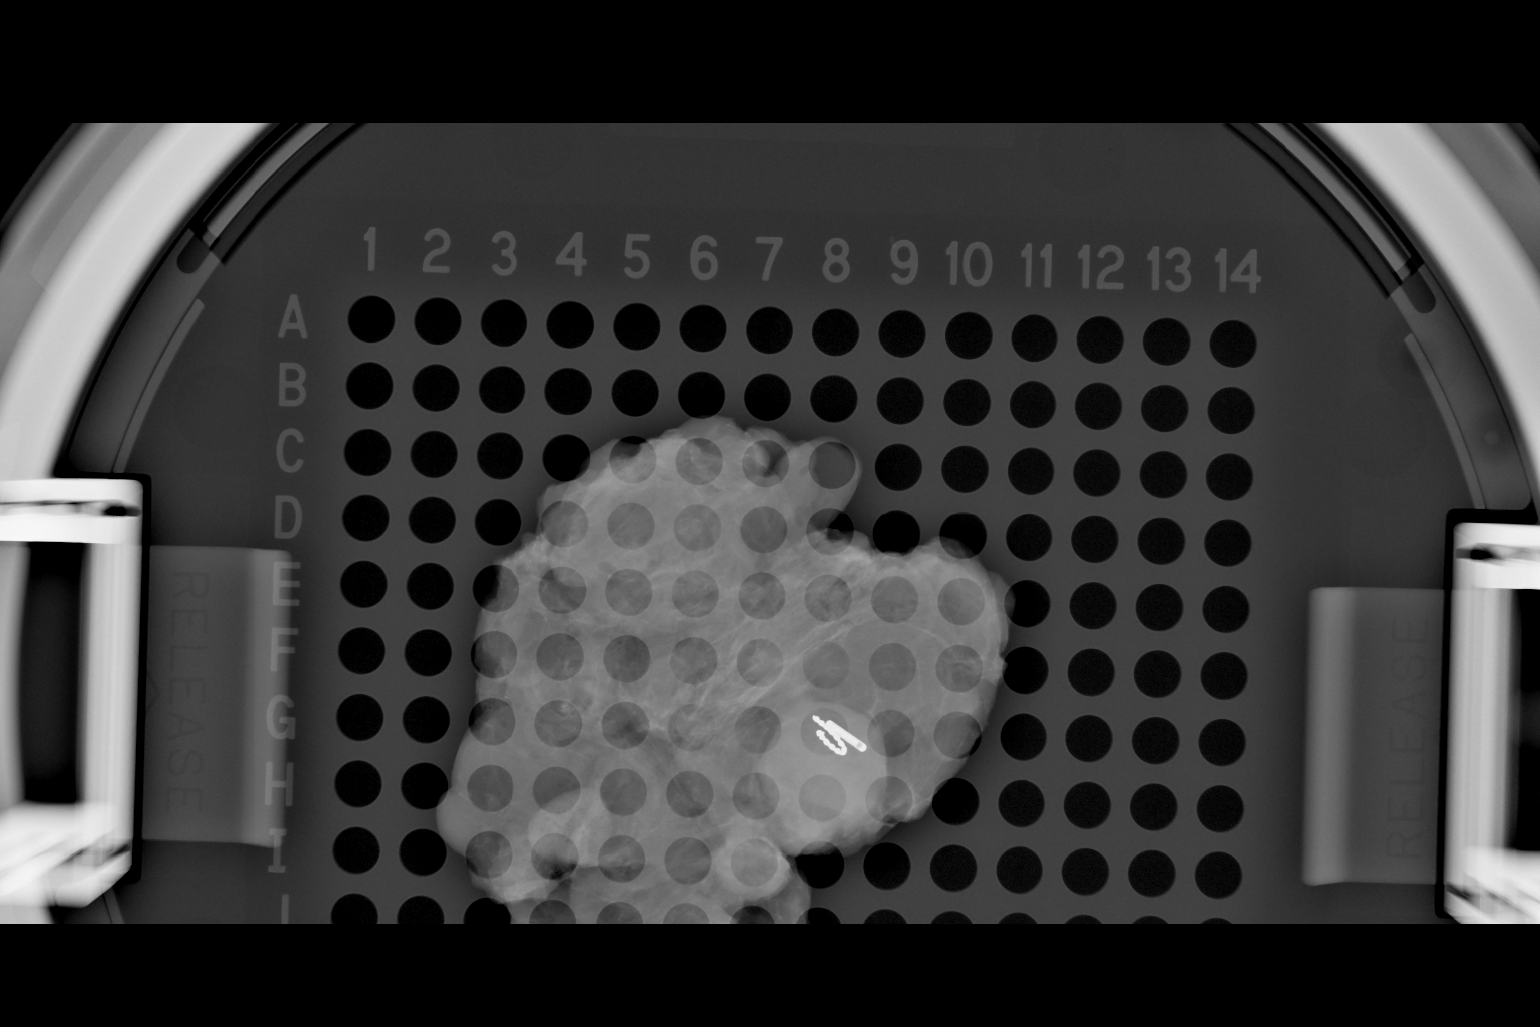

[1 of 1 positions shown; findings below may reference images not displayed]

FINDINGS: Status post excision of a left axillary lymph node. The radioactive
seed and biopsy marker clip are present, completely intact, and were
marked for pathology.
IMPRESSION: Specimen radiograph of the left axilla.

## 2020-08-16 SURGERY — BREAST LUMPECTOMY WITH RADIOACTIVE SEED AND AXILLARY LYMPH NODE DISSECTION
Anesthesia: Regional | Site: Breast | Laterality: Left

## 2020-08-16 MED ORDER — MIDAZOLAM HCL 2 MG/2ML IJ SOLN: 2.0000 mg | Freq: Once | INTRAMUSCULAR | Status: AC

## 2020-08-16 MED ORDER — DEXAMETHASONE SODIUM PHOSPHATE 10 MG/ML IJ SOLN
INTRAMUSCULAR | Status: AC
  Filled 2020-08-16: qty 1

## 2020-08-16 MED ORDER — ONDANSETRON HCL 4 MG/2ML IJ SOLN
INTRAMUSCULAR | Status: AC
  Filled 2020-08-16: qty 2

## 2020-08-16 MED ORDER — LIDOCAINE 2% (20 MG/ML) 5 ML SYRINGE
INTRAMUSCULAR | Status: AC
  Filled 2020-08-16: qty 5

## 2020-08-16 MED ORDER — ROPIVACAINE HCL 5 MG/ML IJ SOLN
INTRAMUSCULAR | Status: DC | PRN
  Administered 2020-08-16: 30 mL via PERINEURAL

## 2020-08-16 MED ORDER — ONDANSETRON HCL 4 MG/2ML IJ SOLN
INTRAMUSCULAR | Status: DC | PRN
  Administered 2020-08-16: 4 mg via INTRAVENOUS

## 2020-08-16 MED ORDER — MIDAZOLAM HCL 2 MG/2ML IJ SOLN
INTRAMUSCULAR | Status: AC
  Filled 2020-08-16: qty 2

## 2020-08-16 MED ORDER — LIDOCAINE HCL (CARDIAC) PF 100 MG/5ML IV SOSY
PREFILLED_SYRINGE | INTRAVENOUS | Status: DC | PRN
  Administered 2020-08-16: 60 mg via INTRATRACHEAL

## 2020-08-16 MED ORDER — METOCLOPRAMIDE HCL 5 MG/ML IJ SOLN
INTRAMUSCULAR | Status: AC
  Filled 2020-08-16: qty 2

## 2020-08-16 MED ORDER — CEFAZOLIN SODIUM-DEXTROSE 2-4 GM/100ML-% IV SOLN
2.0000 g | INTRAVENOUS | Status: AC
  Administered 2020-08-16: 2 g via INTRAVENOUS
  Filled 2020-08-16: qty 100

## 2020-08-16 MED ORDER — METOCLOPRAMIDE HCL 5 MG/ML IJ SOLN
INTRAMUSCULAR | Status: DC | PRN
  Administered 2020-08-16: 10 mg via INTRAVENOUS

## 2020-08-16 MED ORDER — CHLORHEXIDINE GLUCONATE 4 % EX LIQD: 60.0000 mL | Freq: Once | CUTANEOUS | Status: DC

## 2020-08-16 MED ORDER — FENTANYL CITRATE (PF) 250 MCG/5ML IJ SOLN
INTRAMUSCULAR | Status: AC
  Filled 2020-08-16: qty 5

## 2020-08-16 MED ORDER — FENTANYL CITRATE (PF) 250 MCG/5ML IJ SOLN
INTRAMUSCULAR | Status: DC | PRN
Start: 2020-08-16 — End: 2020-08-16
  Administered 2020-08-16 (×3): 50 ug via INTRAVENOUS

## 2020-08-16 MED ORDER — ACETAMINOPHEN 500 MG PO TABS
1000.0000 mg | ORAL_TABLET | ORAL | Status: AC
  Administered 2020-08-16: 1000 mg via ORAL
  Filled 2020-08-16: qty 2

## 2020-08-16 MED ORDER — PROPOFOL 10 MG/ML IV BOLUS
INTRAVENOUS | Status: AC
  Filled 2020-08-16: qty 40

## 2020-08-16 MED ORDER — PROPOFOL 10 MG/ML IV BOLUS
INTRAVENOUS | Status: DC | PRN
  Administered 2020-08-16: 170 mg via INTRAVENOUS

## 2020-08-16 MED ORDER — LACTATED RINGERS IV SOLN: INTRAVENOUS | Status: DC

## 2020-08-16 MED ORDER — DEXAMETHASONE SODIUM PHOSPHATE 10 MG/ML IJ SOLN
INTRAMUSCULAR | Status: DC | PRN
  Administered 2020-08-16: 5 mg via INTRAVENOUS

## 2020-08-16 MED ORDER — FENTANYL CITRATE (PF) 100 MCG/2ML IJ SOLN
INTRAMUSCULAR | Status: AC
  Administered 2020-08-16: 50 ug via INTRAVENOUS
  Filled 2020-08-16: qty 2

## 2020-08-16 MED ORDER — MIDAZOLAM HCL 2 MG/2ML IJ SOLN
INTRAMUSCULAR | Status: DC | PRN
  Administered 2020-08-16: 2 mg via INTRAVENOUS

## 2020-08-16 MED ORDER — BUPIVACAINE-EPINEPHRINE (PF) 0.25% -1:200000 IJ SOLN
INTRAMUSCULAR | Status: AC
  Filled 2020-08-16: qty 30

## 2020-08-16 MED ORDER — PHENYLEPHRINE HCL (PRESSORS) 10 MG/ML IV SOLN
INTRAVENOUS | Status: DC | PRN
  Administered 2020-08-16: 80 ug via INTRAVENOUS

## 2020-08-16 MED ORDER — METHYLENE BLUE 0.5 % INJ SOLN
INTRAVENOUS | Status: AC
  Filled 2020-08-16: qty 10

## 2020-08-16 MED ORDER — SODIUM CHLORIDE (PF) 0.9 % IJ SOLN
INTRAMUSCULAR | Status: AC
  Filled 2020-08-16: qty 10

## 2020-08-16 MED ORDER — TRAMADOL HCL 50 MG PO TABS: 50.0000 mg | ORAL_TABLET | Freq: Four times a day (QID) | ORAL | 0 refills | Status: DC | PRN

## 2020-08-16 MED ORDER — DIPHENHYDRAMINE HCL 50 MG/ML IJ SOLN
INTRAMUSCULAR | Status: DC | PRN
  Administered 2020-08-16: 12.5 mg via INTRAVENOUS

## 2020-08-16 MED ORDER — SODIUM CHLORIDE (PF) 0.9 % IJ SOLN
INTRAVENOUS | Status: DC | PRN
  Administered 2020-08-16: 1 mL

## 2020-08-16 MED ORDER — PHENYLEPHRINE 40 MCG/ML (10ML) SYRINGE FOR IV PUSH (FOR BLOOD PRESSURE SUPPORT)
PREFILLED_SYRINGE | INTRAVENOUS | Status: AC
  Filled 2020-08-16: qty 10

## 2020-08-16 MED ORDER — ORAL CARE MOUTH RINSE: 15.0000 mL | Freq: Once | OROMUCOSAL | Status: AC

## 2020-08-16 MED ORDER — BUPIVACAINE-EPINEPHRINE 0.25% -1:200000 IJ SOLN
INTRAMUSCULAR | Status: DC | PRN
  Administered 2020-08-16: 20 mL

## 2020-08-16 MED ORDER — CHLORHEXIDINE GLUCONATE 0.12 % MT SOLN
15.0000 mL | Freq: Once | OROMUCOSAL | Status: AC
  Administered 2020-08-16: 15 mL via OROMUCOSAL
  Filled 2020-08-16: qty 15

## 2020-08-16 MED ORDER — 0.9 % SODIUM CHLORIDE (POUR BTL) OPTIME
TOPICAL | Status: DC | PRN
  Administered 2020-08-16: 1000 mL

## 2020-08-16 MED ORDER — DIPHENHYDRAMINE HCL 50 MG/ML IJ SOLN
INTRAMUSCULAR | Status: AC
  Filled 2020-08-16: qty 1

## 2020-08-16 MED ORDER — MIDAZOLAM HCL 2 MG/2ML IJ SOLN
INTRAMUSCULAR | Status: AC
  Administered 2020-08-16: 2 mg via INTRAVENOUS
  Filled 2020-08-16: qty 2

## 2020-08-16 MED ORDER — FENTANYL CITRATE (PF) 100 MCG/2ML IJ SOLN: 50.0000 ug | Freq: Once | INTRAMUSCULAR | Status: AC

## 2020-08-16 SURGICAL SUPPLY — 43 items
ADH SKN CLS APL DERMABOND .7 (GAUZE/BANDAGES/DRESSINGS) ×4
APL PRP STRL LF DISP 70% ISPRP (MISCELLANEOUS) ×2
BINDER BREAST LRG (GAUZE/BANDAGES/DRESSINGS) IMPLANT
BINDER BREAST XLRG (GAUZE/BANDAGES/DRESSINGS) ×1 IMPLANT
CANISTER SUCT 3000ML PPV (MISCELLANEOUS) IMPLANT
CHLORAPREP W/TINT 26 (MISCELLANEOUS) ×3 IMPLANT
CLIP VESOCCLUDE SM WIDE 6/CT (CLIP) ×3 IMPLANT
COVER PROBE W GEL 5X96 (DRAPES) ×3 IMPLANT
COVER SURGICAL LIGHT HANDLE (MISCELLANEOUS) ×3 IMPLANT
COVER WAND RF STERILE (DRAPES) ×3 IMPLANT
DECANTER SPIKE VIAL GLASS SM (MISCELLANEOUS) ×3 IMPLANT
DERMABOND ADVANCED (GAUZE/BANDAGES/DRESSINGS) ×2
DERMABOND ADVANCED .7 DNX12 (GAUZE/BANDAGES/DRESSINGS) ×2 IMPLANT
DEVICE DUBIN SPECIMEN MAMMOGRA (MISCELLANEOUS) ×4 IMPLANT
DRAPE CHEST BREAST 15X10 FENES (DRAPES) ×3 IMPLANT
DRAPE HALF SHEET 70X43 (DRAPES) ×3 IMPLANT
ELECT COATED BLADE 2.86 ST (ELECTRODE) ×3 IMPLANT
ELECT REM PT RETURN 9FT ADLT (ELECTROSURGICAL) ×3
ELECTRODE REM PT RTRN 9FT ADLT (ELECTROSURGICAL) ×2 IMPLANT
GAUZE SPONGE 4X4 12PLY STRL (GAUZE/BANDAGES/DRESSINGS) ×5 IMPLANT
GOWN STRL REUS W/ TWL LRG LVL3 (GOWN DISPOSABLE) ×2 IMPLANT
GOWN STRL REUS W/ TWL XL LVL3 (GOWN DISPOSABLE) ×2 IMPLANT
GOWN STRL REUS W/TWL LRG LVL3 (GOWN DISPOSABLE) ×3
GOWN STRL REUS W/TWL XL LVL3 (GOWN DISPOSABLE) ×3
ILLUMINATOR WAVEGUIDE N/F (MISCELLANEOUS) IMPLANT
KIT BASIN OR (CUSTOM PROCEDURE TRAY) ×3 IMPLANT
KIT MARKER MARGIN INK (KITS) ×3 IMPLANT
LIGHT WAVEGUIDE WIDE FLAT (MISCELLANEOUS) IMPLANT
NDL 18GX1X1/2 (RX/OR ONLY) (NEEDLE) IMPLANT
NDL FILTER BLUNT 18X1 1/2 (NEEDLE) IMPLANT
NDL HYPO 25GX1X1/2 BEV (NEEDLE) ×2 IMPLANT
NEEDLE 18GX1X1/2 (RX/OR ONLY) (NEEDLE) IMPLANT
NEEDLE FILTER BLUNT 18X 1/2SAF (NEEDLE) ×1
NEEDLE FILTER BLUNT 18X1 1/2 (NEEDLE) ×2 IMPLANT
NEEDLE HYPO 25GX1X1/2 BEV (NEEDLE) ×3 IMPLANT
NS IRRIG 1000ML POUR BTL (IV SOLUTION) ×3 IMPLANT
PACK GENERAL/GYN (CUSTOM PROCEDURE TRAY) ×3 IMPLANT
PAD ABD 8X10 STRL (GAUZE/BANDAGES/DRESSINGS) ×1 IMPLANT
SUT MNCRL AB 4-0 PS2 18 (SUTURE) ×3 IMPLANT
SUT VIC AB 3-0 SH 8-18 (SUTURE) ×4 IMPLANT
SYR CONTROL 10ML LL (SYRINGE) ×3 IMPLANT
TOWEL GREEN STERILE (TOWEL DISPOSABLE) ×3 IMPLANT
TOWEL GREEN STERILE FF (TOWEL DISPOSABLE) ×3 IMPLANT

## 2020-08-16 NOTE — Anesthesia Procedure Notes (Signed)
Anesthesia Regional Block: Pectoralis block   Pre-Anesthetic Checklist: ,, timeout performed, Correct Patient, Correct Site, Correct Laterality, Correct Procedure, Correct Position, site marked, Risks and benefits discussed,  Surgical consent,  Pre-op evaluation,  At surgeon's request and post-op pain management  Laterality: Left  Prep: Dura Prep       Needles:  Injection technique: Single-shot  Needle Type: Echogenic Stimulator Needle      Needle Gauge: 20     Additional Needles:   Procedures:,,,, ultrasound used (permanent image in chart),,,,  Narrative:  Start time: 08/16/2020 7:55 AM End time: 08/16/2020 7:57 AM Injection made incrementally with aspirations every 5 mL.  Performed by: Personally  Anesthesiologist: Darral Dash, DO  Additional Notes: Patient identified. Risks/Benefits/Options discussed with patient including but not limited to bleeding, infection, nerve damage, failed block, incomplete pain control. Patient expressed understanding and wished to proceed. All questions were answered. Sterile technique was used throughout the entire procedure. Please see nursing notes for vital signs. Aspirated in 5cc intervals with injection for negative confirmation. Patient was given instructions on fall risk and not to get out of bed. All questions and concerns addressed with instructions to call with any issues or inadequate analgesia.

## 2020-08-16 NOTE — Anesthesia Procedure Notes (Signed)
Procedure Name: LMA Insertion Date/Time: 08/16/2020 9:05 AM Performed by: Kathryne Hitch, CRNA Pre-anesthesia Checklist: Patient identified, Emergency Drugs available, Suction available and Patient being monitored Patient Re-evaluated:Patient Re-evaluated prior to induction Oxygen Delivery Method: Circle system utilized Preoxygenation: Pre-oxygenation with 100% oxygen Induction Type: IV induction Ventilation: Mask ventilation without difficulty LMA: LMA inserted LMA Size: 4.0 Number of attempts: 1 Placement Confirmation: positive ETCO2 and breath sounds checked- equal and bilateral Tube secured with: Tape Dental Injury: Teeth and Oropharynx as per pre-operative assessment

## 2020-08-16 NOTE — Discharge Instructions (Addendum)
[facility] - DISCHARGE INSTRUCTIONS TO PATIENT  Activity:  Driving - May drive in 2 o 4 days, if doing well   Lifting - No lifting more than 15 pounds for 1 week, then no limit                       Practice your Covid-19 protection:  Wear a mask, social distance, and wash your hands frequently  Wound Care:   Leave the incision dry for 2 days, then you may shower  Diet:  As tolerated  Follow up appointment:  Call Dr. Pollie Friar office [facility]) at [phone] for an appointment in 2 to 4 weeks..  Medications and dosages:  Resume your home medications.  You have a prescription for:  Ultram             You may also take Tylenol, ibuprofen, or Aleve for pain  Call Dr. [name] or his office  [phone]) if you have:  Temperature greater than 100.4,  Severe uncontrolled pain,  Redness, tenderness, or signs of infection (pain, swelling, redness, odor or green/yellow discharge around the site),  Any other questions or concerns you may have after discharge.  In an emergency, call 911 or go to an Emergency Department at a nearby hospital.

## 2020-08-16 NOTE — Transfer of Care (Signed)
Immediate Anesthesia Transfer of Care Note  Patient: Batul Diego Cott  Procedure(s) Performed: LEFT BREAST LUMPECTOMY WITH RADIOACTIVE SEED (Left Breast) LEFT TARGETED AXILLARY LYMPH NODE DISSECTION (Left Axilla)  Patient Location: PACU  Anesthesia Type:General and Regional  Level of Consciousness: awake, alert  and patient cooperative  Airway & Oxygen Therapy: Patient Spontanous Breathing  Post-op Assessment: Report given to RN and Post -op Vital signs reviewed and stable  Post vital signs: Reviewed and stable  Last Vitals:  Vitals Value Taken Time  BP 107/78 08/16/20 1046  Temp    Pulse 94 08/16/20 1047  Resp 15 08/16/20 1047  SpO2 100 % 08/16/20 1047  Vitals shown include unvalidated device data.  Last Pain:  Vitals:   08/16/20 0747  TempSrc:   PainSc: 0-No pain      Patients Stated Pain Goal: 3 (83/46/21 9471)  Complications: No complications documented.

## 2020-08-16 NOTE — Anesthesia Postprocedure Evaluation (Signed)
Anesthesia Post Note  Patient: Lindsay Hampton  Procedure(s) Performed: LEFT BREAST LUMPECTOMY WITH RADIOACTIVE SEED (Left Breast) LEFT TARGETED AXILLARY LYMPH NODE DISSECTION (Left Axilla)     Patient location during evaluation: PACU Anesthesia Type: Regional and General Level of consciousness: awake and alert Pain management: pain level controlled Vital Signs Assessment: post-procedure vital signs reviewed and stable Respiratory status: spontaneous breathing, nonlabored ventilation, respiratory function stable and patient connected to nasal cannula oxygen Cardiovascular status: blood pressure returned to baseline and stable Postop Assessment: no apparent nausea or vomiting Anesthetic complications: no   No complications documented.  Last Vitals:  Vitals:   08/16/20 1130 08/16/20 1145  BP: 132/88 132/85  Pulse: 69 68  Resp: 12 13  Temp:  36.4 C  SpO2: 100% 100%    Last Pain:  Vitals:   08/16/20 1145  TempSrc:   PainSc: 0-No pain                 [name]

## 2020-08-16 NOTE — Op Note (Signed)
08/16/2020  10:35 AM  PATIENT:  Lindsay Hampton DOB: 09-12-1966 MRN: [id number]  PREOP DIAGNOSIS:   LEFT BREAST CANCER  POSTOP DIAGNOSIS:    Left breast cancer, 3:30 o'clock position (T1, N1)  PROCEDURE:   Procedure(s):  LEFT BREAST LUMPECTOMY WITH RADIOACTIVE SEED, LEFT TARGETED AXILLARY LYMPH NODE DISSECTION, Injection of peri areolar area of breast with methylene blue (1.0 cc), deep sentinel lymph node biopsy  SURGEON:   [name], M.D.  ANESTHESIA:   General  Anesthesiologist: Darral Dash, DO CRNA: Kathryne Hitch, CRNA  General  EBL:  minimal  ml  DRAINS:  none   LOCAL MEDICATIONS USED:   20 cc 1/4% marcaine  SPECIMEN:   Left breast lumpectomy (6 color), inferior margin (suture anterior), lateral margin (suture anterior), sentinel/targeted left axillary node (counts 600 and 250, blue stained)  COUNTS CORRECT:  YES  INDICATIONS FOR PROCEDURE:  Lindsay Hampton is a 54 y.o. (DOB: Aug 02, 1966) whtie female whose primary care physician is [name], [place] and comes for left breast lumpectomy and left targeted axillary lymph node biopsy.   She was seen at the [facility] for a left breast cancer with a positive left axillary node.  The options for breast cancer treatment have been discussed with the patient. She elected to proceed with lumpectomy and axillary sentinel lymph node.     The indications and potential complications of surgery were explained to the patient. Potential complications include, but are not limited to, bleeding, infection, the need for further surgery, and nerve injury.     She had a two I131 seed placed on 08/15/2020, one  in her left breast at 3:30 and one in the left axilla.   In the holding area, her left areola was injected with 1 millicurie of Technitium Sulfur Colloid.  OPERATIVE NOTE:   The patient was taken to operating room # 8 at [facility] where she underwent a general anesthesia  supervised by Anesthesiologist:  Stoltzfus, March Rummage, DO CRNA: Kathryne Hitch, CRNA. Her left breast and axilla were prepped with  ChloraPrep and sterilely draped.    A time-out was held and the surgical check list was reviewed.    I injected about 1.0 mL of 40% methylene blue around her left areola.   The cancer was about at the 4:00 o'clock position of the left breast.   It was 3 cm from the areola.  I used the Neoprobe to identify the I131 seed.  I tried to excise an area around the tumor of at least 1 cm. I made an inferior/laterally based circumareolar incision.   I excised this block of breast tissue approximately 4 cm by 4 cm  in diameter.   I painted the lumpectomy specimen with the 6 color paint kit and did a specimen mammogram which confirmed the mass, clip, and the seed were all in the right position in the specimen.  The specimen was sent to pathology who called back to confirm that they have the seed and the specimen.   I took additional margins at the inferior and lateral lumpectomy sites and painted these.   I then started the left deep axillary sentinel lymph node biopsy. I made an incision in the left axilla.  I found the seed at the junction of the breast and the pectoralis major muscle, deep in the axilla. I identified the seed and took this out.  I also identified a hot node that had counts of 600 and 250 and  the background has 15 counts. A lymph node was blue. I checked her internal mammary nodes and supraclavicular nodes with the neoprobe and found no other hot area. The axillary node was then sent to pathology.    I then irrigated the wound with saline. I infiltrated approximately 30 mL of 1/4% Marcaine between the incisions. I placed 4 clips to mark biopsy cavity, at 12, 3, 6, and 9 o'clock.  I then closed all the wounds in layers using 3-0 Vicryl sutures for the deep layer. At the skin, I closed the incisions with a 4-0 Monocryl suture. The incisions were then painted with Dermabond.  She had gauze placed  over the wounds and her breast placed in a breast binder.   The patient tolerated the procedure well, was transported to the recovery room in good condition. Sponge and needle count were correct at the end of the case.   Final pathology is pending.   [name], MD, [facility] Pager: [phone] Office phone:  [phone]

## 2020-08-16 NOTE — Interval H&P Note (Signed)
History and Physical Interval Note:  08/16/2020 8:33 AM  Lindsay Hampton  has presented today for surgery, with the diagnosis of LEFT BREAST CANCER.  The various methods of treatment have been discussed with the patient and family.  Both seeds identified.  Her husband is here with her today.  After consideration of risks, benefits and other options for treatment, the patient has consented to  Procedure(s) with comments: LEFT BREAST LUMPECTOMY WITH RADIOACTIVE SEED (Left) - PEC BLOCK LEFT TARGETED AXILLARY LYMPH NODE DISSECTION (Left) as a surgical intervention.  The patient's history has been reviewed, patient examined, no change in status, stable for surgery.  I have reviewed the patient's chart and labs.  Questions were answered to the patient's satisfaction.     Shann Medal

## 2020-08-17 ENCOUNTER — Encounter (HOSPITAL_COMMUNITY): Payer: Self-pay | Admitting: Surgery

## 2020-08-17 ENCOUNTER — Other Ambulatory Visit: Payer: Self-pay | Admitting: Hematology and Oncology

## 2020-08-18 ENCOUNTER — Telehealth: Payer: Self-pay

## 2020-08-18 NOTE — Telephone Encounter (Signed)
Spoke with patient. Patient reports labia is swollen with some "pink tinged" vag d/c. Started on 9/23, has improved some today. Denies pain, lesions, vag odor, itching, urinary symptoms or pelvic pain. She is s/p lumpectomy on 08/16/20, states the only new medication that she has taken is tramadol prn. Recommended OV for further evaluation, patient agreeable.   Patient declines OV for today, OV scheduled for 49/27 at 10:30am. If symptoms completely resolve over the weekend, patient will send MyChart message with update and call to cancel appt at 8am on Monday morning.   Routing to Dr. Talbert Nan for final review.

## 2020-08-18 NOTE — Telephone Encounter (Signed)
Patient is calling in regards to having "vaginal issues ".

## 2020-08-21 ENCOUNTER — Telehealth: Payer: Self-pay

## 2020-08-21 ENCOUNTER — Ambulatory Visit: Payer: Self-pay | Admitting: Obstetrics and Gynecology

## 2020-08-21 LAB — SURGICAL PATHOLOGY

## 2020-08-21 NOTE — Telephone Encounter (Signed)
Patient cancelled appointment because she is feeling better.

## 2020-08-21 NOTE — Telephone Encounter (Signed)
See 08/18/20 telephone encounter.   Encounter closed.

## 2020-08-21 NOTE — Progress Notes (Deleted)
GYNECOLOGY  VISIT   HPI: 54 y.o.   Married White or Caucasian Not Hispanic or Latino  female   [phone] with No LMP recorded.   here for swollen labia.     GYNECOLOGIC HISTORY: No LMP recorded. Contraception:none Menopausal hormone therapy: none        OB History    Gravida  3   Para  2   Term  2   Preterm      AB  1   Living  2     SAB  1   TAB      Ectopic      Multiple      Live Births  2              Patient Active Problem List   Diagnosis Date Noted  . Genetic testing 08/01/2020  . Family history of breast cancer 07/26/2020  . Family history of prostate cancer 07/26/2020  . Family history of colon cancer 07/26/2020  . Malignant neoplasm of lower-outer quadrant of left breast of female, estrogen receptor positive ([place]) 07/20/2020    Past Medical History:  Diagnosis Date  . Allergy    seasonal  . Anxiety   . Breast cancer ([place])   . Family history of breast cancer 07/26/2020  . Family history of colon cancer 07/26/2020  . Family history of prostate cancer 07/26/2020    Past Surgical History:  Procedure Laterality Date  . AXILLARY LYMPH NODE DISSECTION Left 08/16/2020   Procedure: LEFT TARGETED AXILLARY LYMPH NODE DISSECTION;  Surgeon: [name], MD;  Location: [facility];  Service: General;  Laterality: Left;  . BREAST LUMPECTOMY WITH RADIOACTIVE SEED AND AXILLARY LYMPH NODE DISSECTION Left 08/16/2020   Procedure: LEFT BREAST LUMPECTOMY WITH RADIOACTIVE SEED;  Surgeon: [name], MD;  Location: [facility];  Service: General;  Laterality: Left;  PEC BLOCK  . BREAST SURGERY Left 06/2020   breast bx   . CERVICAL BIOPSY  W/ LOOP ELECTRODE EXCISION  12/2017  . CESAREAN SECTION    . DILATION AND CURETTAGE OF UTERUS      Current Outpatient Medications  Medication Sig Dispense Refill  . ALPRAZolam (XANAX) 0.25 MG tablet Take 1 tablet (0.25 mg total) by mouth 2 (two) times daily as needed for anxiety. 30 tablet 1  . metroNIDAZOLE (METROCREAM) 0.75 % cream  Apply 1 application topically daily. Apply to face daily    . tamoxifen (NOLVADEX) 20 MG tablet TAKE 1 TABLET BY MOUTH EVERY DAY 30 tablet 1  . traMADol (ULTRAM) 50 MG tablet Take 1 tablet (50 mg total) by mouth every 6 (six) hours as needed for moderate pain or severe pain. 12 tablet 0   No current facility-administered medications for this visit.     ALLERGIES: Patient has no known allergies.  Family History  Problem Relation Age of Onset  . Breast cancer Mother        dx 55  . Prostate cancer Father        dx late 50s/early 60s  . Colon cancer Maternal Grandmother        early 64s  . Breast cancer Cousin        dx 77  . Esophageal cancer Maternal Grandfather        dx 53s  . Cancer Maternal Aunt        maternal grandmother's sister; possible ovarian? dx 41, d. 16s-80s  . Rectal cancer Neg Hx   . Stomach cancer Neg Hx  Social History   Socioeconomic History  . Marital status: Married    Spouse name: Not on file  . Number of children: Not on file  . Years of education: Not on file  . Highest education level: Not on file  Occupational History  . Not on file  Tobacco Use  . Smoking status: Never Smoker  . Smokeless tobacco: Never Used  Vaping Use  . Vaping Use: Never used  Substance and Sexual Activity  . Alcohol use: Not Currently    Comment: not since breast cancer dx  . Drug use: No  . Sexual activity: Not Currently    Partners: Male    Birth control/protection: None  Other Topics Concern  . Not on file  Social History Narrative  . Not on file   Social Determinants of Health   Financial Resource Strain: Low Risk   . Difficulty of Paying Living Expenses: Not hard at all  Food Insecurity: No Food Insecurity  . Worried About Charity fundraiser in the Last Year: Never true  . Ran Out of Food in the Last Year: Never true  Transportation Needs: No Transportation Needs  . Lack of Transportation (Medical): No  . Lack of Transportation (Non-Medical): No   Physical Activity:   . Days of Exercise per Week: Not on file  . Minutes of Exercise per Session: Not on file  Stress:   . Feeling of Stress : Not on file  Social Connections:   . Frequency of Communication with Friends and Family: Not on file  . Frequency of Social Gatherings with Friends and Family: Not on file  . Attends Religious Services: Not on file  . Active Member of Clubs or Organizations: Not on file  . Attends Archivist Meetings: Not on file  . Marital Status: Not on file  Intimate Partner Violence:   . Fear of Current or Ex-Partner: Not on file  . Emotionally Abused: Not on file  . Physically Abused: Not on file  . Sexually Abused: Not on file    ROS  PHYSICAL EXAMINATION:    There were no vitals taken for this visit.    General appearance: alert, cooperative and appears stated age Neck: no adenopathy, supple, symmetrical, trachea midline and thyroid {CHL AMB PHY EX THYROID NORM [phone]::"normal to inspection and palpation"} Breasts: {Exam; breast:13139::"normal appearance, no masses or tenderness"} Abdomen: soft, non-tender; non distended, no masses,  no organomegaly  Pelvic: External genitalia:  no lesions              Urethra:  normal appearing urethra with no masses, tenderness or lesions              Bartholins and Skenes: normal                 Vagina: normal appearing vagina with normal color and discharge, no lesions              Cervix: {CHL AMB PHY EX CERVIX NORM DEFAULT:[phone]::"no lesions"}              Bimanual Exam:  Uterus:  {CHL AMB PHY EX UTERUS NORM DEFAULT:[phone]::"normal size, contour, position, consistency, mobility, non-tender"}              Adnexa: {CHL AMB PHY EX ADNEXA NO MASS DEFAULT:[phone]::"no mass, fullness, tenderness"}              Rectovaginal: {yes no:[id number]}.  Confirms.  Anus:  normal sphincter tone, no lesions  Chaperone was present for exam.  ASSESSMENT     PLAN    An After  Visit Summary was printed and given to the patient.  *** minutes face to face time of which over 50% was spent in counseling.

## 2020-08-22 ENCOUNTER — Other Ambulatory Visit (HOSPITAL_COMMUNITY): Payer: 59

## 2020-08-23 ENCOUNTER — Encounter: Payer: Self-pay | Admitting: *Deleted

## 2020-08-23 ENCOUNTER — Telehealth: Payer: Self-pay | Admitting: *Deleted

## 2020-08-23 NOTE — Telephone Encounter (Signed)
Ordered mammaprint per Dr. Gudena.  Faxed requisition to pathology and [facility] 

## 2020-08-23 NOTE — Progress Notes (Signed)
Patient Care Team: Lindsay Hampton, Veteran as PCP - General (Internal Medicine) Lindsay Germany, RN as Oncology Nurse Navigator Lindsay Kaufmann, RN as Oncology Nurse Navigator Lindsay Overall, MD as Consulting Physician (General Surgery) Lindsay Lose, MD as Consulting Physician (Hematology and Oncology) Kyung Rudd, MD as Consulting Physician (Radiation Oncology)  DIAGNOSIS:    ICD-10-CM   1. Malignant neoplasm of lower-outer quadrant of left breast of female, estrogen receptor positive ([name])  C50.512    Z17.0     SUMMARY OF ONCOLOGIC HISTORY: Oncology History  Malignant neoplasm of lower-outer quadrant of left breast of female, estrogen receptor positive ([name])  07/20/2020 Initial Diagnosis   Screening mammogram detected left breast calcifications, enlarged left axillary lymph nodes, and an asymmetry in the right breast. Diagnostic mammogram showed in the left breast, a 2.4cm mass with calcifications, 3:30 position, a left axillary lymph node with cortical thickness, and in the right breast, no suspicious masses or calcifications. Left breast biopsy showed invasive and in situ mammary carcinoma in the breast and axilla, grade 2, HER-2 equivocal by IHC (2+), negative by FISH, ER+ 95%, PR+40%, Ki67 15%.    07/26/2020 Cancer Staging   Staging form: Breast, AJCC 8th Edition - Clinical stage from 07/26/2020: Stage IIA (cT2, cN1(f), cM0, G2, ER+, PR+, HER2-) - Signed by Lindsay Lose, MD on 07/26/2020   08/01/2020 Genetic Testing   No pathogenic variants detected in [facility] Multi-Cancer Panel.  The Multi-Cancer Panel offered by [facility] includes sequencing and/or deletion duplication testing of the following 85 genes: AIP, ALK, APC, ATM, AXIN2,BAP1,  BARD1, BLM, BMPR1A, BRCA1, BRCA2, BRIP1, CASR, CDC73, CDH1, CDK4, CDKN1B, CDKN1C, CDKN2A (p14ARF), CDKN2A (p16INK4a), CEBPA, CHEK2, CTNNA1, DICER1, DIS3L2, EGFR (c.2369C>T, p.Thr790Met variant only), EPCAM (Deletion/duplication testing only), FH, FLCN,  GATA2, GPC3, GREM1 (Promoter region deletion/duplication testing only), HOXB13 (c.251G>A, p.Gly84Glu), HRAS, KIT, MAX, MEN1, MET, MITF (c.952G>A, p.Glu318Lys variant only), MLH1, MSH2, MSH3, MSH6, MUTYH, NBN, NF1, NF2, NTHL1, PALB2, PDGFRA, PHOX2B, PMS2, POLD1, POLE, POT1, PRKAR1A, PTCH1, PTEN, RAD50, RAD51C, RAD51D, RB1, RECQL4, RET, RNF43, RUNX1, SDHAF2, SDHA (sequence changes only), SDHB, SDHC, SDHD, SMAD4, SMARCA4, SMARCB1, SMARCE1, STK11, SUFU, TERC, TERT, TMEM127, TP53, TSC1, TSC2, VHL, WRN and WT1.  The report date is August 01, 2020.    08/16/2020 Surgery   Left lumpectomy Lindsay Hampton): IDC, grade 2, 2.3cm, with intermediate grade DCIS, clear margins, 1/2 left axillary lymph nodes positive for carcinoma.HER-2 equivocal by IHC (2+), negative by FISH, ER+ 95%, PR+40%, Ki67 15%.    08/24/2020 Cancer Staging   Staging form: Breast, AJCC 8th Edition - Pathologic stage from 08/24/2020: Stage IB (pT2, pN1a, cM0, G2, ER+, PR+, HER2-) - Signed by Lindsay Lose, MD on 08/24/2020     CHIEF COMPLIANT: Follow-up s/p left lumpectomy   INTERVAL HISTORY: Lindsay Hampton is a 54 y.o. with above-mentioned history of invasive mammary carcinoma of the left breast. She underwent a left lumpectomy on 08/16/20 with Dr. Lucia Hampton for which pathology showed invasive ductal carcinoma, grade 2, 2.3cm, with intermediate grade DCIS, clear margins, 1/2 left axillary lymph nodes positive for carcinoma. She presents to the clinic today to review the pathology report and discuss further treatment.  She did very well from surgery.  Mild discomfort in the axilla.  ALLERGIES:  has No Known Allergies.  MEDICATIONS:  Current Outpatient Medications  Medication Sig Dispense Refill  . ALPRAZolam (XANAX) 0.25 MG tablet Take 1 tablet (0.25 mg total) by mouth 2 (two) times daily as needed for anxiety. 30 tablet 1  . metroNIDAZOLE (  METROCREAM) 0.75 % cream Apply 1 application topically daily. Apply to face daily    . tamoxifen (NOLVADEX) 20  MG tablet TAKE 1 TABLET BY MOUTH EVERY DAY 30 tablet 1  . traMADol (ULTRAM) 50 MG tablet Take 1 tablet (50 mg total) by mouth every 6 (six) hours as needed for moderate pain or severe pain. 12 tablet 0   No current facility-administered medications for this visit.    PHYSICAL EXAMINATION: ECOG PERFORMANCE STATUS: 1 - Symptomatic but completely ambulatory  There were no vitals filed for this visit. There were no vitals filed for this visit.  LABORATORY DATA:  I have reviewed the data as listed CMP Latest Ref Rng & Units 08/14/2020 07/26/2020 11/17/2019  Glucose 70 - 99 mg/dL 117(H) 129(H) 96  BUN 6 - 20 mg/dL _0 Creatinine 0.44 - 1.00 mg/dL 1.04(H) 0.89 0.77  Sodium 135 - 145 mmol/L 138 137 137  Potassium 3.5 - 5.1 mmol/L 4.0 3.8 3.7  Chloride 98 - 111 mmol/L 103 101 103  CO2 22 - 32 mmol/L _1 Calcium 8.9 - 10.3 mg/dL 12.6(H) 11.0(H) 9.5  Total Protein 6.5 - 8.1 g/dL - 7.9 7.3  Total Bilirubin 0.3 - 1.2 mg/dL - 0.9 0.8  Alkaline Phos 38 - 126 U/L - 55 39  AST 15 - 41 U/L - 31 24  ALT 0 - 44 U/L - 68(H) 31    Lab Results  Component Value Date   WBC 9.6 08/14/2020   HGB 13.9 08/14/2020   HCT 42.7 08/14/2020   MCV 94.3 08/14/2020   PLT 320 08/14/2020   NEUTROABS 7.2 07/26/2020    ASSESSMENT & PLAN:  Malignant neoplasm of lower-outer quadrant of left breast of female, estrogen receptor positive ([name]) 08/16/2020:Left lumpectomy Lindsay Hampton): IDC, grade 2, 2.3cm, with intermediate grade DCIS, clear margins, 1/2 left axillary lymph nodes positive for carcinoma.HER-2 equivocal by IHC (2+), negative by FISH, ER+ 95%, PR+40%, Ki67 15%.  T2 N1 M0 stage Ib  Pathology counseling: I discussed the final pathology report of the patient provided  a copy of this report. I discussed the margins as well as lymph node surgeries. We also discussed the final staging along with previously performed ER/PR and HER-2/neu testing.  Treatment plan: 1. MammaPrint testing to determine if  chemotherapy would be of any benefit followed by 2. Adjuvant radiation therapy followed by 3. Adjuvant antiestrogen therapy ------------------------------------------------------------------------------------------------------------------------------------ Tamoxifen toxicities: Patient received tamoxifen preoperatively Severe anxiety: On Xanax  Return to clinic based upon MammaPrint test results  No orders of the defined types were placed in this encounter.  The patient has a good understanding of the Hampton plan. she agrees with it. she will call with any problems that may develop before the next visit here.  Total time spent: 30 mins including face to face time and time spent for planning, charting and coordination of care  Lindsay Lose, MD 08/24/2020  I, Cloyde Reams Dorshimer, am acting as scribe for Dr. Nicholas Hampton.  I have reviewed the above documentation for accuracy and completeness, and I agree with the above.

## 2020-08-24 ENCOUNTER — Inpatient Hospital Stay (HOSPITAL_BASED_OUTPATIENT_CLINIC_OR_DEPARTMENT_OTHER): Payer: 59 | Admitting: Hematology and Oncology

## 2020-08-24 ENCOUNTER — Other Ambulatory Visit: Payer: Self-pay

## 2020-08-24 DIAGNOSIS — Z170 Estrogen receptor positive status [ER+]: Secondary | ICD-10-CM

## 2020-08-24 DIAGNOSIS — C50512 Malignant neoplasm of lower-outer quadrant of left female breast: Secondary | ICD-10-CM | POA: Diagnosis not present

## 2020-08-24 NOTE — Assessment & Plan Note (Signed)
08/16/2020:Left lumpectomy Lindsay Hampton): IDC, grade 2, 2.3cm, with intermediate grade DCIS, clear margins, 1/2 left axillary lymph nodes positive for carcinoma.HER-2 equivocal by IHC (2+), negative by FISH, ER+ 95%, PR+40%, Ki67 15%.  T2 N1 M0 stage Ib  Pathology counseling: I discussed the final pathology report of the patient provided  a copy of this report. I discussed the margins as well as lymph node surgeries. We also discussed the final staging along with previously performed ER/PR and HER-2/neu testing.  Treatment plan: 1. MammaPrint testing to determine if chemotherapy would be of any benefit followed by 2. Adjuvant radiation therapy followed by 3. Adjuvant antiestrogen therapy ------------------------------------------------------------------------------------------------------------------------------------ Tamoxifen toxicities: Patient received tamoxifen preoperatively Severe anxiety: On Xanax  Return to clinic based upon MammaPrint test results

## 2020-09-01 ENCOUNTER — Telehealth: Payer: Self-pay | Admitting: *Deleted

## 2020-09-01 ENCOUNTER — Encounter: Payer: Self-pay | Admitting: *Deleted

## 2020-09-01 NOTE — Telephone Encounter (Signed)
Received mammaprint results of high risk. Patient is aware.  Confirmed appointment for 10/11 at 845 to discuss with Dr. Lindi Adie.

## 2020-09-04 ENCOUNTER — Inpatient Hospital Stay: Payer: 59 | Attending: Hematology and Oncology | Admitting: Hematology and Oncology

## 2020-09-04 ENCOUNTER — Other Ambulatory Visit: Payer: Self-pay | Admitting: Surgery

## 2020-09-04 ENCOUNTER — Other Ambulatory Visit: Payer: Self-pay

## 2020-09-04 ENCOUNTER — Encounter: Payer: Self-pay | Admitting: *Deleted

## 2020-09-04 VITALS — BP 142/91 | HR 92 | Temp 97.8°F | Resp 18 | Ht 66.0 in | Wt 214.1 lb

## 2020-09-04 DIAGNOSIS — C773 Secondary and unspecified malignant neoplasm of axilla and upper limb lymph nodes: Secondary | ICD-10-CM | POA: Insufficient documentation

## 2020-09-04 DIAGNOSIS — Z5111 Encounter for antineoplastic chemotherapy: Secondary | ICD-10-CM | POA: Insufficient documentation

## 2020-09-04 DIAGNOSIS — Z170 Estrogen receptor positive status [ER+]: Secondary | ICD-10-CM | POA: Diagnosis not present

## 2020-09-04 DIAGNOSIS — Z79899 Other long term (current) drug therapy: Secondary | ICD-10-CM | POA: Diagnosis not present

## 2020-09-04 DIAGNOSIS — F4322 Adjustment disorder with anxiety: Secondary | ICD-10-CM | POA: Diagnosis not present

## 2020-09-04 DIAGNOSIS — F419 Anxiety disorder, unspecified: Secondary | ICD-10-CM | POA: Insufficient documentation

## 2020-09-04 DIAGNOSIS — C50512 Malignant neoplasm of lower-outer quadrant of left female breast: Secondary | ICD-10-CM | POA: Insufficient documentation

## 2020-09-04 MED ORDER — ONDANSETRON HCL 8 MG PO TABS: 8.0000 mg | ORAL_TABLET | Freq: Two times a day (BID) | ORAL | 1 refills | Status: DC | PRN

## 2020-09-04 MED ORDER — ALPRAZOLAM 0.25 MG PO TABS: 0.2500 mg | ORAL_TABLET | Freq: Two times a day (BID) | ORAL | 3 refills | Status: DC | PRN

## 2020-09-04 MED ORDER — LIDOCAINE-PRILOCAINE 2.5-2.5 % EX CREA: TOPICAL_CREAM | CUTANEOUS | 3 refills | Status: DC

## 2020-09-04 MED ORDER — PROCHLORPERAZINE MALEATE 10 MG PO TABS: 10.0000 mg | ORAL_TABLET | Freq: Four times a day (QID) | ORAL | 1 refills | Status: DC | PRN

## 2020-09-04 MED ORDER — DEXAMETHASONE 4 MG PO TABS: 4.0000 mg | ORAL_TABLET | Freq: Once | ORAL | 0 refills | Status: AC

## 2020-09-04 NOTE — Progress Notes (Signed)
Patient Care Team: [name], [place] as PCP - General (Internal Medicine) [name], RN as Oncology Nurse Navigator [name], RN as Oncology Nurse Navigator [name], MD as Consulting Physician (General Surgery) [name], MD as Consulting Physician (Hematology and Oncology) Kyung Rudd, MD as Consulting Physician (Radiation Oncology)  DIAGNOSIS:  Encounter Diagnosis  Name Primary?  . Malignant neoplasm of lower-outer quadrant of left breast of female, estrogen receptor positive ([place])     SUMMARY OF ONCOLOGIC HISTORY: Oncology History  Malignant neoplasm of lower-outer quadrant of left breast of female, estrogen receptor positive ([place])  07/20/2020 Initial Diagnosis   Screening mammogram detected left breast calcifications, enlarged left axillary lymph nodes, and an asymmetry in the right breast. Diagnostic mammogram showed in the left breast, a 2.4cm mass with calcifications, 3:30 position, a left axillary lymph node with cortical thickness, and in the right breast, no suspicious masses or calcifications. Left breast biopsy showed invasive and in situ mammary carcinoma in the breast and axilla, grade 2, HER-2 equivocal by IHC (2+), negative by FISH, ER+ 95%, PR+40%, Ki67 15%.    07/26/2020 Cancer Staging   Staging form: Breast, AJCC 8th Edition - Clinical stage from 07/26/2020: Stage IIA (cT2, cN1(f), cM0, G2, ER+, PR+, HER2-) - Signed by [name], MD on 07/26/2020   08/01/2020 Genetic Testing   No pathogenic variants detected in [facility] Multi-Cancer Panel.  The Multi-Cancer Panel offered by [facility] includes sequencing and/or deletion duplication testing of the following 85 genes: AIP, ALK, APC, ATM, AXIN2,BAP1,  BARD1, BLM, BMPR1A, BRCA1, BRCA2, BRIP1, CASR, CDC73, CDH1, CDK4, CDKN1B, CDKN1C, CDKN2A (p14ARF), CDKN2A (p16INK4a), CEBPA, CHEK2, CTNNA1, DICER1, DIS3L2, EGFR (c.2369C>T, p.Thr790Met variant only), EPCAM (Deletion/duplication testing only), FH,  FLCN, GATA2, GPC3, GREM1 (Promoter region deletion/duplication testing only), HOXB13 (c.251G>A, p.Gly84Glu), HRAS, KIT, MAX, MEN1, MET, MITF (c.952G>A, p.Glu318Lys variant only), MLH1, MSH2, MSH3, MSH6, MUTYH, NBN, NF1, NF2, NTHL1, PALB2, PDGFRA, PHOX2B, PMS2, POLD1, POLE, POT1, PRKAR1A, PTCH1, PTEN, RAD50, RAD51C, RAD51D, RB1, RECQL4, RET, RNF43, RUNX1, SDHAF2, SDHA (sequence changes only), SDHB, SDHC, SDHD, SMAD4, SMARCA4, SMARCB1, SMARCE1, STK11, SUFU, TERC, TERT, TMEM127, TP53, TSC1, TSC2, VHL, WRN and WT1.  The report date is August 01, 2020.    08/16/2020 Surgery   Left lumpectomy [name]): IDC, grade 2, 2.3cm, with intermediate grade DCIS, clear margins, 1/2 left axillary lymph nodes positive for carcinoma.HER-2 equivocal by IHC (2+), negative by FISH, ER+ 95%, PR+40%, Ki67 15%.    08/24/2020 Cancer Staging   Staging form: Breast, AJCC 8th Edition - Pathologic stage from 08/24/2020: Stage IB (pT2, pN1a, cM0, G2, ER+, PR+, HER2-) - Signed by [name], MD on 08/24/2020   09/18/2020 -  Chemotherapy   The patient had DEXAMETHASONE 4 MG PO TABS, 8 mg, Oral, Daily, 0 of 1 cycle, Start date: --, End date: -- DOXOrubicin (ADRIAMYCIN) chemo injection 130 mg, 60 mg/m2, Intravenous,  Once, 0 of 4 cycles PALONOSETRON HCL INJECTION 0.25 MG/5ML, 0.25 mg, Intravenous,  Once, 0 of 4 cycles pegfilgrastim-jmdb (FULPHILA) injection 6 mg, 6 mg, Subcutaneous,  Once, 0 of 4 cycles cyclophosphamide (CYTOXAN) 1,300 mg in sodium chloride 0.9 % 250 mL chemo infusion, 600 mg/m2, Intravenous,  Once, 0 of 4 cycles PACLitaxel (TAXOL) 174 mg in sodium chloride 0.9 % 250 mL chemo infusion (</= 22m/m2), 80 mg/m2, Intravenous,  Once, 0 of 12 cycles FOSAPREPITANT IV INFUSION 150 MG, 150 mg, Intravenous,  Once, 0 of 4 cycles  for chemotherapy treatment.      CHIEF COMPLIANT: Follow-up to  discuss results of MammaPrint  INTERVAL HISTORY: CHIARA COLTRIN is a 54 year old above-mentioned history of left breast cancer  treated with lumpectomy. She had one positive lymph node. She underwent MammaPrint testing to determine if she would benefit from chemotherapy. MammaPrint came back as high risk and she is here today accompanied by her mother to discuss the adjuvant treatment plan. She is extremely anxious and scared and worried.  ALLERGIES:  has No Known Allergies.  MEDICATIONS:  Current Outpatient Medications  Medication Sig Dispense Refill  . ALPRAZolam (XANAX) 0.25 MG tablet Take 1 tablet (0.25 mg total) by mouth 2 (two) times daily as needed for anxiety. 30 tablet 1  . metroNIDAZOLE (METROCREAM) 0.75 % cream Apply 1 application topically daily. Apply to face daily    . tamoxifen (NOLVADEX) 20 MG tablet TAKE 1 TABLET BY MOUTH EVERY DAY 30 tablet 1  . traMADol (ULTRAM) 50 MG tablet Take 1 tablet (50 mg total) by mouth every 6 (six) hours as needed for moderate pain or severe pain. 12 tablet 0   No current facility-administered medications for this visit.    PHYSICAL EXAMINATION: ECOG PERFORMANCE STATUS: 1 - Symptomatic but completely ambulatory  Vitals:   09/04/20 0846  BP: (!) 142/91  Pulse: 92  Resp: 18  Temp: 97.8 F (36.6 C)  SpO2: 98%   Filed Weights   09/04/20 0846  Weight: 214 lb 1.6 oz (97.1 kg)      LABORATORY DATA:  I have reviewed the data as listed CMP Latest Ref Rng & Units 08/14/2020 07/26/2020 11/17/2019  Glucose 70 - 99 mg/dL 117(H) 129(H) 96  BUN 6 - 20 mg/dL 17 11 12   Creatinine 0.44 - 1.00 mg/dL 1.04(H) 0.89 0.77  Sodium 135 - 145 mmol/L 138 137 137  Potassium 3.5 - 5.1 mmol/L 4.0 3.8 3.7  Chloride 98 - 111 mmol/L 103 101 103  CO2 22 - 32 mmol/L 25 26 20   Calcium 8.9 - 10.3 mg/dL 12.6(H) 11.0(H) 9.5  Total Protein 6.5 - 8.1 g/dL - 7.9 7.3  Total Bilirubin 0.3 - 1.2 mg/dL - 0.9 0.8  Alkaline Phos 38 - 126 U/L - 55 39  AST 15 - 41 U/L - 31 24  ALT 0 - 44 U/L - 68(H) 31    Lab Results  Component Value Date   WBC 9.6 08/14/2020   HGB 13.9 08/14/2020   HCT 42.7  08/14/2020   MCV 94.3 08/14/2020   PLT 320 08/14/2020   NEUTROABS 7.2 07/26/2020    ASSESSMENT & PLAN:  Malignant neoplasm of lower-outer quadrant of left breast of female, estrogen receptor positive ([place]) 08/16/2020:Left lumpectomy [name]): IDC, grade 2, 2.3cm, with intermediate grade DCIS, clear margins, 1/2 left axillary lymph nodes positive for carcinoma.HER-2 equivocal by IHC (2+), negative by FISH, ER+ 95%, PR+40%, Ki67 15%.  T2 N1 M0 stage Ib MammaPrint: High risk (average 10-year risk untreated: 29%, predicted benefit of chemo with hormone therapy at 5 years: 94.6%  Recommendation: Adjuvant chemotherapy with dose dense Adriamycin and Cytoxan x4 followed by Taxol x12  Chemotherapy Counseling: I discussed the risks and benefits of chemotherapy including the risks of nausea/ vomiting, risk of infection from low WBC count, fatigue due to chemo or anemia, bruising or bleeding due to low platelets, mouth sores, loss/ change in taste and decreased appetite. Liver and kidney function will be monitored through out chemotherapy as abnormalities in liver and kidney function may be a side effect of treatment.  Cardiac dysfunction due to Adriamycin and peripheral neuropathy  from Taxol were discussed in detail.  Risk of permanent bone marrow dysfunction and leukemia due to chemo were also discussed.  Severe anxiety: I will renew her prescription for Xanax.  Return to clinic to start chemo.    Orders Placed This Encounter  Procedures  . ECHOCARDIOGRAM COMPLETE    Standing Status:   Future    Standing Expiration Date:   09/04/2021    Order Specific Question:   Where should this test be performed    Answer:   [facility]    Order Specific Question:   Perflutren DEFINITY (image enhancing agent) should be administered unless hypersensitivity or allergy exist    Answer:   Administer Perflutren    Order Specific Question:   Reason for exam-Echo    Answer:   Chemo  V67.2 / Z09   The patient has a  good understanding of the overall plan. she agrees with it. she will call with any problems that may develop before the next visit here. Total time spent: 45 mins including face to face time and time spent for planning, charting and co-ordination of care   Harriette Ohara, MD 09/04/20

## 2020-09-04 NOTE — Assessment & Plan Note (Addendum)
08/16/2020:Left lumpectomy ([place]): IDC, grade 2, 2.3cm, with intermediate grade DCIS, clear margins, 1/2 left axillary lymph nodes positive for carcinoma.HER-2 equivocal by IHC (2+), negative by FISH, ER+ 95%, PR+40%, Ki67 15%.  T2 N1 M0 stage Ib MammaPrint: High risk (average 10-year risk untreated: 29%, predicted benefit of chemo with hormone therapy at 5 years: 94.6%  Recommendation: Adjuvant chemotherapy with dose dense Adriamycin and Cytoxan x4 followed by Taxol x12  Chemotherapy Counseling: I discussed the risks and benefits of chemotherapy including the risks of nausea/ vomiting, risk of infection from low WBC count, fatigue due to chemo or anemia, bruising or bleeding due to low platelets, mouth sores, loss/ change in taste and decreased appetite. Liver and kidney function will be monitored through out chemotherapy as abnormalities in liver and kidney function may be a side effect of treatment.  Cardiac dysfunction due to Adriamycin and peripheral neuropathy from Taxol were discussed in detail.  Risk of permanent bone marrow dysfunction and leukemia due to chemo were also discussed.  Return to clinic to start chemo. 

## 2020-09-04 NOTE — Progress Notes (Signed)
START ON PATHWAY REGIMEN - Breast     Cycles 1 through 4: A cycle is every 14 days:     Doxorubicin      Cyclophosphamide      Pegfilgrastim-xxxx    Cycles 5 through 16: A cycle is every 7 days:     Paclitaxel   **Always confirm dose/schedule in your pharmacy ordering system**  Patient Characteristics: Postoperative without Neoadjuvant Therapy (Pathologic Staging), Invasive Disease, Adjuvant Therapy, HER2 Negative/Unknown/Equivocal, ER Positive, Node Positive, Node Positive (1-3), MammaPrint(R) Ordered, High Genomic Risk Therapeutic Status: Postoperative without Neoadjuvant Therapy (Pathologic Staging) AJCC Grade: G2 AJCC N Category: pN1 AJCC M Category: cM0 ER Status: Positive (+) AJCC 8 Stage Grouping: IB HER2 Status: Negative (-) Oncotype Dx Recurrence Score: Ordered Other Genomic Test AJCC T Category: pT2 PR Status: Positive (+) Has this patient completed genomic testing<= Yes - MammaPrint(R) MammaPrint(R) Score: High Genomic Risk Intent of Therapy: Curative Intent, Discussed with Patient

## 2020-09-05 ENCOUNTER — Encounter (HOSPITAL_COMMUNITY): Payer: Self-pay

## 2020-09-06 ENCOUNTER — Telehealth: Payer: Self-pay | Admitting: Hematology and Oncology

## 2020-09-06 NOTE — Patient Instructions (Addendum)
DUE TO COVID-19 ONLY ONE VISITOR IS ALLOWED TO COME WITH YOU AND STAY IN THE WAITING ROOM ONLY  DURING PRE OP AND PROCEDURE.   IF YOU WILL BE ADMITTED INTO THE HOSPITAL YOU ARE ALLOWED ONE SUPPORT PERSON DURING VISITATION  HOURS ONLY (10AM -8PM)   . The support person may change daily. . The support person must pass our screening, gel in and out, and wear a mask at all times, including in the patient's room. . Patients must also wear a mask when staff or their support person are in the room.   COVID SWAB TESTING MUST BE COMPLETED ON:   Friday, 09-08-20 @ 12:05 PM   [address] [place], [place] [address]  (Must self quarantine after testing. Follow instructions on  handout.)   Your procedure is scheduled on:  Tuesday, 09-12-20   Report to [facility] Main  Entrance   Report to Short Stay at 5:30 AM   [facility])   Call this number if you have problems the morning of surgery [phone]   Do not eat food :After Midnight.   May have liquids until 4:30 AM  day of surgery   CLEAR LIQUID DIET  Foods Allowed                                                                     Foods Excluded  Water, Black Coffee and tea, regular and decaf                liquids that you cannot  Plain Jell-O in any flavor  (No red)                                      see through such as: Fruit ices (not with fruit pulp)                                      milk, soups, orange juice              Iced Popsicles (No red)                                      All solid food                                   Apple juices Sports drinks like Gatorade (No red) Lightly seasoned clear broth or consume(fat free) Sugar, honey syrup     Complete one Ensure drink the morning of surgery at 4:15 AM  the day of surgery.     Oral Hygiene is also important to reduce your risk of infection.                                    Remember - BRUSH YOUR TEETH THE MORNING OF SURGERY WITH YOUR REGULAR  TOOTHPASTE   Do  NOT smoke after Midnight   Take these medicines the morning of surgery with A SIP OF WATER:  None                                You may not have any metal on your body including hair pins, jewelry, and body piercings             Do not wear make-up, lotions, powders, perfumes/cologne, or deodorant             Do not wear nail polish.  Do not shave  48 hours prior to surgery.    Do not bring valuables to the hospital. [place].   Contacts, dentures or bridgework may not be worn into surgery.     Patients discharged the day of surgery will not be allowed to drive home.               Please read over the following fact sheets you were given: IF YOU HAVE QUESTIONS ABOUT YOUR PRE OP INSTRUCTIONS PLEASE CALL [phone]   [place] - Preparing for Surgery Before surgery, you can play an important role.  Because skin is not sterile, your skin needs to be as free of germs as possible.  You can reduce the number of germs on your skin by washing with CHG (chlorahexidine gluconate) soap before surgery.  CHG is an antiseptic cleaner which kills germs and bonds with the skin to continue killing germs even after washing. Please DO NOT use if you have an allergy to CHG or antibacterial soaps.  If your skin becomes reddened/irritated stop using the CHG and inform your nurse when you arrive at Short Stay. Do not shave (including legs and underarms) for at least 48 hours prior to the first CHG shower.  You may shave your face/neck.  Please follow these instructions carefully:  1.  Shower with CHG Soap the night before surgery and the  morning of surgery.  2.  If you choose to wash your hair, wash your hair first as usual with your normal  shampoo.  3.  After you shampoo, rinse your hair and body thoroughly to remove the shampoo.                             4.  Use CHG as you would any other liquid soap.  You can apply chg directly to the skin  and wash.  Gently with a scrungie or clean washcloth.  5.  Apply the CHG Soap to your body ONLY FROM THE NECK DOWN.   Do   not use on face/ open                           Wound or open sores. Avoid contact with eyes, ears mouth and   genitals (private parts).                       Wash face,  Genitals (private parts) with your normal soap.             6.  Wash thoroughly, paying special attention to the area where your    surgery  will be performed.  7.  Thoroughly rinse your body with warm water from the neck down.  8.  DO  NOT shower/wash with your normal soap after using and rinsing off the CHG Soap.                9.  Pat yourself dry with a clean towel.            10.  Wear clean pajamas.            11.  Place clean sheets on your bed the night of your first shower and do not  sleep with pets. Day of Surgery : Do not apply any lotions/deodorants the morning of surgery.  Please wear clean clothes to the hospital/surgery center.  FAILURE TO FOLLOW THESE INSTRUCTIONS MAY RESULT IN THE CANCELLATION OF YOUR SURGERY  PATIENT SIGNATURE_________________________________  NURSE SIGNATURE__________________________________  ________________________________________________________________________

## 2020-09-06 NOTE — Telephone Encounter (Signed)
Scheduled per 10/11 los. Called and spoke with pt, confirmed 10/14 appt

## 2020-09-06 NOTE — Progress Notes (Addendum)
COVID Vaccine Completed: x2 Date COVID Vaccine completed: 01-30-20 & 03-01-20 COVID vaccine manufacturer: [employer]   PCP - Jodi Mourning, FNP Cardiologist -   Chest x-ray -  EKG -  Stress Test -  ECHO -  Cardiac Cath -  Pacemaker/ICD device last checked:  Sleep Study -  CPAP -   Fasting Blood Sugar -  Checks Blood Sugar _____ times a day  Blood Thinner Instructions: Aspirin Instructions: Last Dose:  Anesthesia review:   Patient denies shortness of breath, fever, cough and chest pain at PAT appointment   Patient verbalized understanding of instructions that were given to them at the PAT appointment. Patient was also instructed that they will need to review over the PAT instructions again at home before surgery.

## 2020-09-07 ENCOUNTER — Encounter (HOSPITAL_COMMUNITY)
Admission: RE | Admit: 2020-09-07 | Discharge: 2020-09-07 | Disposition: A | Discharge status: Home / Self Care | Payer: 59 | Source: Ambulatory Visit | Attending: Surgery | Admitting: Surgery

## 2020-09-07 ENCOUNTER — Encounter (HOSPITAL_COMMUNITY): Payer: Self-pay

## 2020-09-07 ENCOUNTER — Other Ambulatory Visit: Payer: Self-pay | Admitting: *Deleted

## 2020-09-07 ENCOUNTER — Encounter: Payer: Self-pay | Admitting: *Deleted

## 2020-09-07 ENCOUNTER — Telehealth: Payer: Self-pay | Admitting: *Deleted

## 2020-09-07 ENCOUNTER — Other Ambulatory Visit: Payer: Self-pay

## 2020-09-07 ENCOUNTER — Inpatient Hospital Stay: Payer: 59

## 2020-09-07 DIAGNOSIS — Z01812 Encounter for preprocedural laboratory examination: Secondary | ICD-10-CM | POA: Insufficient documentation

## 2020-09-07 HISTORY — DX: Gastro-esophageal reflux disease without esophagitis: K21.9

## 2020-09-07 HISTORY — DX: Anemia, unspecified: D64.9

## 2020-09-07 HISTORY — DX: Pneumonia, unspecified organism: J18.9

## 2020-09-07 LAB — CBC
HCT: 41.2 % (ref 36.0–46.0)
Hemoglobin: 13.8 g/dL (ref 12.0–15.0)
MCH: 31.1 pg (ref 26.0–34.0)
MCHC: 33.5 g/dL (ref 30.0–36.0)
MCV: 92.8 fL (ref 80.0–100.0)
Platelets: 309 10*3/uL (ref 150–400)
RBC: 4.44 MIL/uL (ref 3.87–5.11)
RDW: 12.5 % (ref 11.5–15.5)
WBC: 8.3 10*3/uL (ref 4.0–10.5)
nRBC: 0 % (ref 0.0–0.2)

## 2020-09-07 MED ORDER — PEGFILGRASTIM-BMEZ 6 MG/0.6ML ~~LOC~~ SOSY: 6.0000 mg | PREFILLED_SYRINGE | Freq: Once | SUBCUTANEOUS | 3 refills | Status: AC

## 2020-09-07 NOTE — Telephone Encounter (Signed)
Called pt, relate questions were answered during chemo education class. Discussed hair loss and when to except the initiation of loss. Denies further questions or needs.

## 2020-09-07 NOTE — Progress Notes (Signed)
Pharmacist Chemotherapy Monitoring - Initial Assessment    Anticipated start date: 09/13/20   Regimen:   Are orders appropriate based on the patients diagnosis, regimen, and cycle? Yes  Does the plan date match the patients scheduled date? Yes  Is the sequencing of drugs appropriate? Yes  Are the premedications appropriate for the patients regimen? Yes  Prior Authorization for treatment is: Approved o If applicable, is the correct biosimilar selected based on the patient's insurance? not applicable  Organ Function and Labs:  Are dose adjustments needed based on the patient's renal function, hepatic function, or hematologic function? Yes  Are appropriate labs ordered prior to the start of patient's treatment? Yes  Other organ system assessment, if indicated: N/A  The following baseline labs, if indicated, have been ordered: N/A  Dose Assessment:  Are the drug doses appropriate? Yes  Are the following correct: o Drug concentrations Yes o IV fluid compatible with drug Yes o Administration routes Yes o Timing of therapy Yes  If applicable, does the patient have documented access for treatment and/or plans for port-a-cath placement? yes  If applicable, have lifetime cumulative doses been properly documented and assessed? yes Lifetime Dose Tracking  No doses have been documented on this patient for the following tracked chemicals: Doxorubicin, Epirubicin, Idarubicin, Daunorubicin, Mitoxantrone, Bleomycin, Oxaliplatin, Carboplatin, Liposomal Doxorubicin  o   Toxicity Monitoring/Prevention:  The patient has the following take home antiemetics prescribed: Ondansetron and Prochlorperazine  The patient has the following take home medications prescribed: N/A  Medication allergies and previous infusion related reactions, if applicable, have been reviewed and addressed. Yes  The patient's current medication list has been assessed for drug-drug interactions with their  chemotherapy regimen. no significant drug-drug interactions were identified on review.  Order Review:  Are the treatment plan orders signed? Yes  Is the patient scheduled to see a provider prior to their treatment? Yes  I verify that I have reviewed each item in the above checklist and answered each question accordingly.  Romualdo Bolk [facility] 09/07/2020 3:32 PM

## 2020-09-07 NOTE — Progress Notes (Signed)
Received message from [place] team stating pt insurance is requiring pt to self administer Ziextenzo at home.  RN successfully faxed [facility] Ziextenzo enrollment and prescription form to [phone]). Pt notified she will self inject Ziextenzo 0.6 mL (6mg  total) 48 hours post chemotherapy treatment every 2 weeks x4 treatment cycles starting on 09/15/20.  Pt educated that prescription has been sent to mail order pharmacy and the pharmacy should reach out to her to set up delivery and home health RN.  Pt verbalized understanding.

## 2020-09-08 ENCOUNTER — Ambulatory Visit (HOSPITAL_COMMUNITY)
Admission: RE | Admit: 2020-09-08 | Discharge: 2020-09-08 | Disposition: A | Discharge status: Home / Self Care | Payer: 59 | Source: Ambulatory Visit | Attending: Hematology and Oncology | Admitting: Hematology and Oncology

## 2020-09-08 ENCOUNTER — Other Ambulatory Visit (HOSPITAL_COMMUNITY)
Admission: RE | Admit: 2020-09-08 | Discharge: 2020-09-08 | Disposition: A | Discharge status: Home / Self Care | Payer: 59 | Source: Ambulatory Visit | Attending: Surgery | Admitting: Surgery

## 2020-09-08 ENCOUNTER — Telehealth: Payer: Self-pay | Admitting: Hematology and Oncology

## 2020-09-08 DIAGNOSIS — Z170 Estrogen receptor positive status [ER+]: Secondary | ICD-10-CM | POA: Insufficient documentation

## 2020-09-08 DIAGNOSIS — Z20822 Contact with and (suspected) exposure to covid-19: Secondary | ICD-10-CM | POA: Insufficient documentation

## 2020-09-08 DIAGNOSIS — Z0189 Encounter for other specified special examinations: Secondary | ICD-10-CM

## 2020-09-08 DIAGNOSIS — C50512 Malignant neoplasm of lower-outer quadrant of left female breast: Secondary | ICD-10-CM

## 2020-09-08 DIAGNOSIS — Z01818 Encounter for other preprocedural examination: Secondary | ICD-10-CM | POA: Diagnosis present

## 2020-09-08 DIAGNOSIS — Z01812 Encounter for preprocedural laboratory examination: Secondary | ICD-10-CM | POA: Insufficient documentation

## 2020-09-08 LAB — SARS CORONAVIRUS 2 (TAT 6-24 HRS): SARS Coronavirus 2: NEGATIVE

## 2020-09-08 LAB — ECHOCARDIOGRAM COMPLETE
Area-P 1/2: 5.66 cm2
S' Lateral: 2.8 cm

## 2020-09-08 NOTE — Telephone Encounter (Signed)
Called pt per 10/13 sch msg - left message for patient with appt date and time

## 2020-09-08 NOTE — Progress Notes (Signed)
  Echocardiogram 2D Echocardiogram has been performed.  Lindsay Hampton 09/08/2020, 9:38 AM

## 2020-09-11 ENCOUNTER — Encounter (HOSPITAL_COMMUNITY): Payer: Self-pay | Admitting: Surgery

## 2020-09-11 NOTE — Anesthesia Preprocedure Evaluation (Addendum)
Anesthesia Evaluation  Patient identified by MRN, date of birth, ID band Patient awake    Reviewed: Allergy & Precautions, NPO status , Patient's Chart, lab work & pertinent test results  History of Anesthesia Complications Negative for: history of anesthetic complications  Airway Mallampati: II  TM Distance: >3 FB Neck ROM: Full    Dental  (+) Teeth Intact   Pulmonary neg pulmonary ROS,    Pulmonary exam normal        Cardiovascular negative cardio ROS Normal cardiovascular exam     Neuro/Psych Anxiety negative neurological ROS     GI/Hepatic Neg liver ROS, GERD  ,  Endo/Other  negative endocrine ROS  Renal/GU negative Renal ROS  negative genitourinary   Musculoskeletal negative musculoskeletal ROS (+)   Abdominal   Peds  Hematology negative hematology ROS (+)   Anesthesia Other Findings  Breast cancer  Reproductive/Obstetrics                            Anesthesia Physical Anesthesia Plan  ASA: II  Anesthesia Plan: General   Post-op Pain Management:    Induction: Intravenous  PONV Risk Score and Plan: 3 and Ondansetron, Dexamethasone, Midazolam and Treatment may vary due to age or medical condition  Airway Management Planned: LMA  Additional Equipment: None  Intra-op Plan:   Post-operative Plan: Extubation in OR  Informed Consent: I have reviewed the patients History and Physical, chart, labs and discussed the procedure including the risks, benefits and alternatives for the proposed anesthesia with the patient or authorized representative who has indicated his/her understanding and acceptance.     Dental advisory given  Plan Discussed with:   Anesthesia Plan Comments:        Anesthesia Quick Evaluation

## 2020-09-11 NOTE — H&P (Signed)
Lindsay Hampton  Location: [facility] Patient #: [id number] DOB: May 30, 1966 Undefined / Language: [name] / Race: White Female  History of Present Illness   The patient is a 54 year old female who presents with a complaint of breast cancer.  The PCP is Lindsay Hampton, [facility])  The patient is at the [facility] - Oncology is Lindsay Hampton and Lindsay Hampton  She is accompanied by husband, Lindsay Hampton   She had a left breast lumpectomy wth left axillary targeted lymph node dissection - 08/16/2020 - path - 2.3 cm IDC, grade 2, closest margin is deep, additional inferior and lateral margins are okay, 1/2 nodes positive.   Her mammoprint is high risk.  She has seen Dr. Lindi Hampton and discussed chemotherapy.  We discussed power port placement.  I discussed the indications and potential complications of the power port placement.  The primary complications of the power port, include, but are not limited to, bleeding, infection, nerve injury, thrombosis, and pneumothorax.  History of left breast cancer Mammograms: [facility] [place] - 07/12/2020 - 2.4 x 1.8 x 2.2 cm mass in the left breast. there is a single abnormal left axillary lymph node that has cortical thickness of 0.6 cm. Radiology recommended MRI for extent of disease and given the ill defined margins of the mass. Biopsy: Left breast biopsy 3:30 o'clock on 07/17/2020 ([id number])- IDC, grade 2, ER - 95%, PR - 40%, Ki67 - 15%, Her2Neu - negative, Left axillary node - POSITIVE Family history of breast or ovarian cancer: Her mother had a lumpectomy and rad tx at age 67 - she is alive and doing well. She has a cousin on her dad's side who has breast cancer On hormone therapy: Still having periods  Plan: 1. For chemotherapy, 2. Power port placement  Past Medical History: 1.  Left breast cancer  Oncology - Lindsay Hampton/Lindsay Hampton  Story: Left breast biopsy 3:30 o'clock on 07/17/2020 ([id number])- IDC, grade 2, ER - 95%, PR - 40%, Ki67 - 15%,  Her2Neu - negative, Left axillary node - POSITIVE  Left breast lumpectomy wth left axillary targeted lymph node dissection - 08/16/2020 - path - 2.3 cm IDC, grade 2, closest margin is deep, additional inferior and lateral margins are okay, 1/2 nodes positive  Mammoprint high risk 2. Colonoscopy - 2019 - [place] 3. She has the Covid vaccine  Social History: She is accompanied by husband, Lindsay Hampton. He is the son of Al Kreuzer She has 2 children - [place] - 53 yo in [place], and [place] - 53 yo, a hotel employee She is not working  Medication History Conni Slipper, RN; 07/26/2020 7:55 AM) Medications Reconciled   Physical Exam  General: Overweight WF who is alert and generally healthy appearing. She is wearing a mask. HEENT: Normal. Pupils equal.  Neck: Supple. No mass. No thyroid mass. Lymph Nodes: No supraclavicular or cervical nodes.  Left axillary incision.  Lungs: Clear to auscultation and symmetric breath sounds. Heart: RRR. No murmur or rub.  Breast: right - no mass or nodule  Left - left c;ircumareolar incision.  Abdomen: Soft. No mass. No tenderness. No hernia. Normal bowel sounds.  Extremities: Good strength and ROM in upper and lower extremities.  Assessment & Plan  1.  BREAST CANCER, STAGE 2, LEFT (O75.643)  Story: Left breast biopsy 3:30 o'clock on 07/17/2020 ([id number])- IDC, grade 2, ER - 95%, PR - 40%, Ki67 - 15%, Her2Neu - negative, Left axillary node - POSITIVE  Oncology - Lindsay Hampton/Lindsay Hampton  Left breast lumpectomy wth left axillary  targeted lymph node dissection - 08/16/2020 - path - 2.3 cm IDC, grade 2, closest margin is deep, additional inferior and lateral margins are okay, 1/2 nodes positive   Addendum Note( H. [name] MD; 09/03/2020 4:27 PM)  Received mammaprint results of high risk. She is aware. I have scheduled her to see Dr. Lindi Hampton 10/11 at 845am to discuss.  2. Plan chemotherapy for high risk breast cancer - she has discussed this with Dr.  Lindi Hampton  Plan power port placement   [name], MD, [facility] Office phone:  [phone]

## 2020-09-12 ENCOUNTER — Ambulatory Visit (HOSPITAL_COMMUNITY): Payer: 59 | Admitting: Anesthesiology

## 2020-09-12 ENCOUNTER — Encounter (HOSPITAL_COMMUNITY): Payer: Self-pay | Admitting: Surgery

## 2020-09-12 ENCOUNTER — Ambulatory Visit (HOSPITAL_COMMUNITY)
Admission: RE | Admit: 2020-09-12 | Discharge: 2020-09-12 | Disposition: A | Discharge status: Home / Self Care | Payer: 59 | Attending: Surgery | Admitting: Surgery

## 2020-09-12 ENCOUNTER — Ambulatory Visit (HOSPITAL_COMMUNITY): Payer: 59

## 2020-09-12 ENCOUNTER — Encounter (HOSPITAL_COMMUNITY)
Admission: RE | Disposition: A | Discharge status: Home / Self Care | Payer: Self-pay | Source: Home / Self Care | Attending: Surgery

## 2020-09-12 DIAGNOSIS — Z95828 Presence of other vascular implants and grafts: Secondary | ICD-10-CM

## 2020-09-12 DIAGNOSIS — Z803 Family history of malignant neoplasm of breast: Secondary | ICD-10-CM | POA: Diagnosis not present

## 2020-09-12 DIAGNOSIS — C50912 Malignant neoplasm of unspecified site of left female breast: Secondary | ICD-10-CM | POA: Insufficient documentation

## 2020-09-12 HISTORY — PX: PORTACATH PLACEMENT: SHX2246

## 2020-09-12 LAB — HCG, SERUM, QUALITATIVE: Preg, Serum: NEGATIVE

## 2020-09-12 IMAGING — DX DG CHEST 1V PORT
1 series · 1 of 1 positions shown · non-contrast
Comparison: None.

CLINICAL DATA: Port-A-Cath placement

EXAM:
PORTABLE CHEST 1 VIEW

[chest ap]
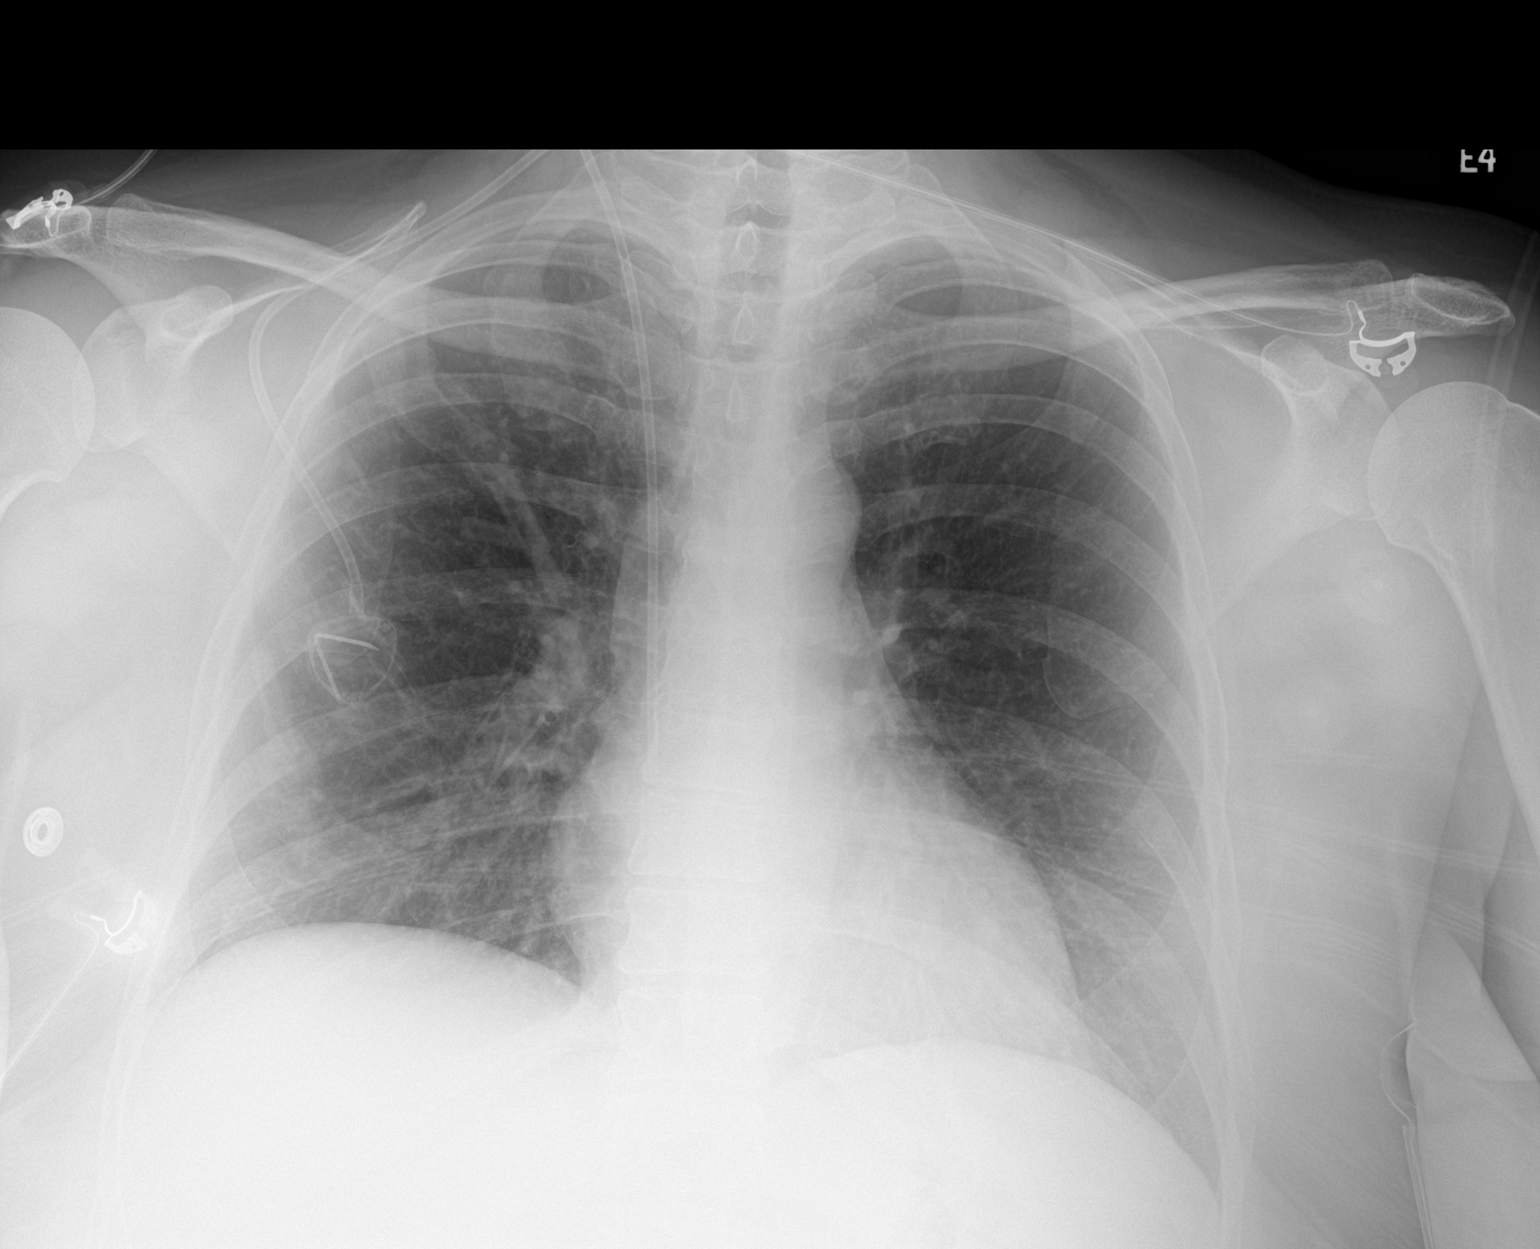

[1 of 1 positions shown; findings below may reference images not displayed]

FINDINGS: There is a right internal jugular approach Port-A-Cath with distal
tip terminating at the level of the superior cavoatrial junction.
Normal heart size. Lungs are clear. No pleural effusion or
pneumothorax. There is a small amount of expected postoperative
subcutaneous edema at the base of the right neck.
IMPRESSION: Satisfactory positioning of right internal jugular approach
Port-A-Cath. No pneumothorax.

## 2020-09-12 SURGERY — INSERTION, TUNNELED CENTRAL VENOUS DEVICE, WITH PORT
Anesthesia: General | Site: Chest

## 2020-09-12 MED ORDER — CHLORHEXIDINE GLUCONATE 4 % EX LIQD: 60.0000 mL | Freq: Once | CUTANEOUS | Status: DC

## 2020-09-12 MED ORDER — OXYCODONE HCL 5 MG/5ML PO SOLN: 5.0000 mg | Freq: Once | ORAL | Status: DC | PRN

## 2020-09-12 MED ORDER — MIDAZOLAM HCL 2 MG/2ML IJ SOLN
INTRAMUSCULAR | Status: AC
  Filled 2020-09-12: qty 2

## 2020-09-12 MED ORDER — ONDANSETRON HCL 4 MG/2ML IJ SOLN
INTRAMUSCULAR | Status: DC | PRN
  Administered 2020-09-12: 4 mg via INTRAVENOUS

## 2020-09-12 MED ORDER — ONDANSETRON HCL 4 MG/2ML IJ SOLN: 4.0000 mg | Freq: Once | INTRAMUSCULAR | Status: DC | PRN

## 2020-09-12 MED ORDER — AMISULPRIDE (ANTIEMETIC) 5 MG/2ML IV SOLN: 10.0000 mg | Freq: Once | INTRAVENOUS | Status: DC | PRN

## 2020-09-12 MED ORDER — SODIUM CHLORIDE 0.9 % IV SOLN
Freq: Once | INTRAVENOUS | Status: AC
  Administered 2020-09-12: 500 mL
  Filled 2020-09-12: qty 1.2

## 2020-09-12 MED ORDER — BUPIVACAINE-EPINEPHRINE 0.25% -1:200000 IJ SOLN
INTRAMUSCULAR | Status: AC
  Filled 2020-09-12: qty 1

## 2020-09-12 MED ORDER — ACETAMINOPHEN 500 MG PO TABS
1000.0000 mg | ORAL_TABLET | ORAL | Status: AC
  Administered 2020-09-12: 1000 mg via ORAL
  Filled 2020-09-12: qty 2

## 2020-09-12 MED ORDER — PROPOFOL 10 MG/ML IV BOLUS
INTRAVENOUS | Status: DC | PRN
  Administered 2020-09-12: 175 mg via INTRAVENOUS

## 2020-09-12 MED ORDER — HEPARIN SOD (PORK) LOCK FLUSH 100 UNIT/ML IV SOLN
INTRAVENOUS | Status: DC | PRN
  Administered 2020-09-12: 500 [IU] via INTRAVENOUS

## 2020-09-12 MED ORDER — FENTANYL CITRATE (PF) 100 MCG/2ML IJ SOLN
INTRAMUSCULAR | Status: AC
  Filled 2020-09-12: qty 2

## 2020-09-12 MED ORDER — HEPARIN SOD (PORK) LOCK FLUSH 100 UNIT/ML IV SOLN
INTRAVENOUS | Status: AC
  Filled 2020-09-12: qty 5

## 2020-09-12 MED ORDER — DEXAMETHASONE SODIUM PHOSPHATE 10 MG/ML IJ SOLN
INTRAMUSCULAR | Status: AC
  Filled 2020-09-12: qty 1

## 2020-09-12 MED ORDER — FENTANYL CITRATE (PF) 100 MCG/2ML IJ SOLN: 25.0000 ug | INTRAMUSCULAR | Status: DC | PRN

## 2020-09-12 MED ORDER — EPHEDRINE 5 MG/ML INJ
INTRAVENOUS | Status: AC
  Filled 2020-09-12: qty 10

## 2020-09-12 MED ORDER — ONDANSETRON HCL 4 MG/2ML IJ SOLN
INTRAMUSCULAR | Status: AC
  Filled 2020-09-12: qty 2

## 2020-09-12 MED ORDER — MIDAZOLAM HCL 5 MG/5ML IJ SOLN
INTRAMUSCULAR | Status: DC | PRN
  Administered 2020-09-12: 2 mg via INTRAVENOUS

## 2020-09-12 MED ORDER — DEXAMETHASONE SODIUM PHOSPHATE 4 MG/ML IJ SOLN
INTRAMUSCULAR | Status: DC | PRN
  Administered 2020-09-12: 5 mg via INTRAVENOUS

## 2020-09-12 MED ORDER — LIDOCAINE HCL (PF) 1 % IJ SOLN
INTRAMUSCULAR | Status: AC
  Filled 2020-09-12: qty 30

## 2020-09-12 MED ORDER — BUPIVACAINE-EPINEPHRINE (PF) 0.25% -1:200000 IJ SOLN
INTRAMUSCULAR | Status: DC | PRN
  Administered 2020-09-12: 10 mL

## 2020-09-12 MED ORDER — PROPOFOL 10 MG/ML IV BOLUS
INTRAVENOUS | Status: AC
  Filled 2020-09-12: qty 40

## 2020-09-12 MED ORDER — ORAL CARE MOUTH RINSE: 15.0000 mL | Freq: Once | OROMUCOSAL | Status: AC

## 2020-09-12 MED ORDER — EPHEDRINE SULFATE-NACL 50-0.9 MG/10ML-% IV SOSY
PREFILLED_SYRINGE | INTRAVENOUS | Status: DC | PRN
  Administered 2020-09-12: 10 mg via INTRAVENOUS
  Administered 2020-09-12: 5 mg via INTRAVENOUS

## 2020-09-12 MED ORDER — 0.9 % SODIUM CHLORIDE (POUR BTL) OPTIME
TOPICAL | Status: DC | PRN
  Administered 2020-09-12: 1000 mL

## 2020-09-12 MED ORDER — DEXMEDETOMIDINE (PRECEDEX) IN NS 20 MCG/5ML (4 MCG/ML) IV SYRINGE
PREFILLED_SYRINGE | INTRAVENOUS | Status: DC | PRN
  Administered 2020-09-12 (×2): 8 ug via INTRAVENOUS
  Administered 2020-09-12: 4 ug via INTRAVENOUS

## 2020-09-12 MED ORDER — FENTANYL CITRATE (PF) 100 MCG/2ML IJ SOLN
INTRAMUSCULAR | Status: DC | PRN
  Administered 2020-09-12 (×2): 50 ug via INTRAVENOUS

## 2020-09-12 MED ORDER — CHLORHEXIDINE GLUCONATE 0.12 % MT SOLN
15.0000 mL | Freq: Once | OROMUCOSAL | Status: AC
  Administered 2020-09-12: 15 mL via OROMUCOSAL

## 2020-09-12 MED ORDER — LIDOCAINE 2% (20 MG/ML) 5 ML SYRINGE
INTRAMUSCULAR | Status: AC
  Filled 2020-09-12: qty 5

## 2020-09-12 MED ORDER — DEXMEDETOMIDINE (PRECEDEX) IN NS 20 MCG/5ML (4 MCG/ML) IV SYRINGE
PREFILLED_SYRINGE | INTRAVENOUS | Status: AC
  Filled 2020-09-12: qty 5

## 2020-09-12 MED ORDER — CEFAZOLIN SODIUM-DEXTROSE 2-4 GM/100ML-% IV SOLN
2.0000 g | INTRAVENOUS | Status: AC
  Administered 2020-09-12: 2 g via INTRAVENOUS
  Filled 2020-09-12: qty 100

## 2020-09-12 MED ORDER — OXYCODONE HCL 5 MG PO TABS: 5.0000 mg | ORAL_TABLET | Freq: Once | ORAL | Status: DC | PRN

## 2020-09-12 MED ORDER — PHENYLEPHRINE HCL (PRESSORS) 10 MG/ML IV SOLN
INTRAVENOUS | Status: DC | PRN
  Administered 2020-09-12: 80 ug via INTRAVENOUS
  Administered 2020-09-12 (×2): 120 ug via INTRAVENOUS

## 2020-09-12 MED ORDER — LIDOCAINE HCL (CARDIAC) PF 100 MG/5ML IV SOSY
PREFILLED_SYRINGE | INTRAVENOUS | Status: DC | PRN
  Administered 2020-09-12: 100 mg via INTRAVENOUS

## 2020-09-12 MED ORDER — LACTATED RINGERS IV SOLN: INTRAVENOUS | Status: DC

## 2020-09-12 MED ORDER — LIDOCAINE 2% (20 MG/ML) 5 ML SYRINGE
INTRAMUSCULAR | Status: AC
  Filled 2020-09-12: qty 10

## 2020-09-12 MED ORDER — ROCURONIUM BROMIDE 10 MG/ML (PF) SYRINGE
PREFILLED_SYRINGE | INTRAVENOUS | Status: AC
  Filled 2020-09-12: qty 10

## 2020-09-12 SURGICAL SUPPLY — 39 items
ADH SKN CLS APL DERMABOND .7 (GAUZE/BANDAGES/DRESSINGS)
APL PRP STRL LF DISP 70% ISPRP (MISCELLANEOUS) ×1
APL SKNCLS STERI-STRIP NONHPOA (GAUZE/BANDAGES/DRESSINGS) ×1
BAG DECANTER FOR FLEXI CONT (MISCELLANEOUS) ×2 IMPLANT
BENZOIN TINCTURE PRP APPL 2/3 (GAUZE/BANDAGES/DRESSINGS) ×1 IMPLANT
BLADE SURG 15 STRL LF DISP TIS (BLADE) ×1 IMPLANT
BLADE SURG 15 STRL SS (BLADE) ×2
CHLORAPREP W/TINT 26 (MISCELLANEOUS) ×2 IMPLANT
COVER PROBE U/S 5X48 (MISCELLANEOUS) ×1 IMPLANT
COVER SURGICAL LIGHT HANDLE (MISCELLANEOUS) ×2 IMPLANT
COVER WAND RF STERILE (DRAPES) IMPLANT
DECANTER SPIKE VIAL GLASS SM (MISCELLANEOUS) ×2 IMPLANT
DERMABOND ADVANCED (GAUZE/BANDAGES/DRESSINGS)
DERMABOND ADVANCED .7 DNX12 (GAUZE/BANDAGES/DRESSINGS) IMPLANT
DRAPE C-ARM 42X120 X-RAY (DRAPES) ×2 IMPLANT
DRAPE LAPAROSCOPIC ABDOMINAL (DRAPES) ×2 IMPLANT
ELECT REM PT RETURN 15FT ADLT (MISCELLANEOUS) ×2 IMPLANT
GAUZE 4X4 16PLY RFD (DISPOSABLE) ×2 IMPLANT
GAUZE SPONGE 2X2 8PLY STRL LF (GAUZE/BANDAGES/DRESSINGS) IMPLANT
GLOVE SURG SYN 7.5  E (GLOVE) ×2
GLOVE SURG SYN 7.5 E (GLOVE) ×1 IMPLANT
GLOVE SURG SYN 7.5 PF PI (GLOVE) ×1 IMPLANT
GOWN STRL REUS W/TWL XL LVL3 (GOWN DISPOSABLE) ×4 IMPLANT
IV CONNECTOR ONE LINK NDLESS (IV SETS) ×2 IMPLANT
KIT BASIN OR (CUSTOM PROCEDURE TRAY) ×2 IMPLANT
KIT PORT POWER 8FR ISP CVUE (Port) ×1 IMPLANT
KIT TURNOVER KIT A (KITS) ×1 IMPLANT
NDL HYPO 25X1 1.5 SAFETY (NEEDLE) ×1 IMPLANT
NEEDLE HYPO 25X1 1.5 SAFETY (NEEDLE) ×2 IMPLANT
PACK BASIC VI WITH GOWN DISP (CUSTOM PROCEDURE TRAY) ×2 IMPLANT
PENCIL SMOKE EVACUATOR (MISCELLANEOUS) IMPLANT
SPONGE GAUZE 2X2 STER 10/PKG (GAUZE/BANDAGES/DRESSINGS)
STRIP CLOSURE SKIN 1/2X4 (GAUZE/BANDAGES/DRESSINGS) ×1 IMPLANT
SUT MNCRL AB 4-0 PS2 18 (SUTURE) ×2 IMPLANT
SUT VIC AB 3-0 SH 18 (SUTURE) ×2 IMPLANT
SYR 10ML ECCENTRIC (SYRINGE) ×2 IMPLANT
SYR CONTROL 10ML LL (SYRINGE) ×2 IMPLANT
TOWEL OR 17X26 10 PK STRL BLUE (TOWEL DISPOSABLE) ×2 IMPLANT
TOWEL OR NON WOVEN STRL DISP B (DISPOSABLE) ×2 IMPLANT

## 2020-09-12 NOTE — Transfer of Care (Signed)
Immediate Anesthesia Transfer of Care Note  Patient: Lindsay Hampton  Procedure(s) Performed: INSERTION PORT-A-CATH WITH ULTRASOUND (Chest)  Patient Location: PACU  Anesthesia Type:General  Level of Consciousness: awake, alert  and patient cooperative  Airway & Oxygen Therapy: Patient Spontanous Breathing and Patient connected to face mask oxygen  Post-op Assessment: Report given to RN and Post -op Vital signs reviewed and stable  Post vital signs: Reviewed and stable  Last Vitals:  Vitals Value Taken Time  BP 143/72 09/12/20 0831  Temp 36.7 C 09/12/20 0831  Pulse 88 09/12/20 0834  Resp 18 09/12/20 0831  SpO2 100 % 09/12/20 0834  Vitals shown include unvalidated device data.  Last Pain:  Vitals:   09/12/20 0831  TempSrc:   PainSc: 0-No pain         Complications: No complications documented.

## 2020-09-12 NOTE — Anesthesia Postprocedure Evaluation (Signed)
Anesthesia Post Note  Patient: Lindsay Hampton  Procedure(s) Performed: INSERTION PORT-A-CATH WITH ULTRASOUND (Chest)     Patient location during evaluation: PACU Anesthesia Type: General Level of consciousness: awake and alert Pain management: pain level controlled Vital Signs Assessment: post-procedure vital signs reviewed and stable Respiratory status: spontaneous breathing, nonlabored ventilation and respiratory function stable Cardiovascular status: blood pressure returned to baseline and stable Postop Assessment: no apparent nausea or vomiting Anesthetic complications: no   No complications documented.  Last Vitals:  Vitals:   09/12/20 0845 09/12/20 0915  BP: 126/81 128/75  Pulse: 80 81  Resp: 12   Temp:    SpO2: 100% 99%    Last Pain:  Vitals:   09/12/20 0915  TempSrc:   PainSc: 0-No pain                 Lidia Collum

## 2020-09-12 NOTE — Interval H&P Note (Signed)
History and Physical Interval Note:  09/12/2020 7:18 AM  Lindsay Hampton  has presented today for surgery, with the diagnosis of LEFT BREAST CANCER.  The various methods of treatment have been discussed with the patient and family.   Her mother is to pick her up.  After consideration of risks, benefits and other options for treatment, the patient has consented to  Procedure(s): INSERTION PORT-A-CATH (N/A) as a surgical intervention.  The patient's history has been reviewed, patient examined, no change in status, stable for surgery.  I have reviewed the patient's chart and labs.  Questions were answered to the patient's satisfaction.     Shann Medal

## 2020-09-12 NOTE — Progress Notes (Signed)
Patient Care Team: [name], [place] as PCP - General (Internal Medicine) [name], RN as Oncology Nurse Navigator [name], RN as Oncology Nurse Navigator [name], MD as Consulting Physician (General Surgery) [name], MD as Consulting Physician (Hematology and Oncology) Kyung Rudd, MD as Consulting Physician (Radiation Oncology)  DIAGNOSIS:    ICD-10-CM   1. Malignant neoplasm of lower-outer quadrant of left breast of female, estrogen receptor positive ([place])  C50.512    Z17.0     SUMMARY OF ONCOLOGIC HISTORY: Oncology History  Malignant neoplasm of lower-outer quadrant of left breast of female, estrogen receptor positive ([place])  07/20/2020 Initial Diagnosis   Screening mammogram detected left breast calcifications, enlarged left axillary lymph nodes, and an asymmetry in the right breast. Diagnostic mammogram showed in the left breast, a 2.4cm mass with calcifications, 3:30 position, a left axillary lymph node with cortical thickness, and in the right breast, no suspicious masses or calcifications. Left breast biopsy showed invasive and in situ mammary carcinoma in the breast and axilla, grade 2, HER-2 equivocal by IHC (2+), negative by FISH, ER+ 95%, PR+40%, Ki67 15%.    07/26/2020 Cancer Staging   Staging form: Breast, AJCC 8th Edition - Clinical stage from 07/26/2020: Stage IIA (cT2, cN1(f), cM0, G2, ER+, PR+, HER2-) - Signed by [name], MD on 07/26/2020   08/01/2020 Genetic Testing   No pathogenic variants detected in [facility] Multi-Cancer Panel.  The Multi-Cancer Panel offered by [facility] includes sequencing and/or deletion duplication testing of the following 85 genes: AIP, ALK, APC, ATM, AXIN2,BAP1,  BARD1, BLM, BMPR1A, BRCA1, BRCA2, BRIP1, CASR, CDC73, CDH1, CDK4, CDKN1B, CDKN1C, CDKN2A (p14ARF), CDKN2A (p16INK4a), CEBPA, CHEK2, CTNNA1, DICER1, DIS3L2, EGFR (c.2369C>T, p.Thr790Met variant only), EPCAM (Deletion/duplication testing only), FH, FLCN,  GATA2, GPC3, GREM1 (Promoter region deletion/duplication testing only), HOXB13 (c.251G>A, p.Gly84Glu), HRAS, KIT, MAX, MEN1, MET, MITF (c.952G>A, p.Glu318Lys variant only), MLH1, MSH2, MSH3, MSH6, MUTYH, NBN, NF1, NF2, NTHL1, PALB2, PDGFRA, PHOX2B, PMS2, POLD1, POLE, POT1, PRKAR1A, PTCH1, PTEN, RAD50, RAD51C, RAD51D, RB1, RECQL4, RET, RNF43, RUNX1, SDHAF2, SDHA (sequence changes only), SDHB, SDHC, SDHD, SMAD4, SMARCA4, SMARCB1, SMARCE1, STK11, SUFU, TERC, TERT, TMEM127, TP53, TSC1, TSC2, VHL, WRN and WT1.  The report date is August 01, 2020.    08/16/2020 Surgery   Left lumpectomy [name]): IDC, grade 2, 2.3cm, with intermediate grade DCIS, clear margins, 1/2 left axillary lymph nodes positive for carcinoma.HER-2 equivocal by IHC (2+), negative by FISH, ER+ 95%, PR+40%, Ki67 15%.    08/24/2020 Cancer Staging   Staging form: Breast, AJCC 8th Edition - Pathologic stage from 08/24/2020: Stage IB (pT2, pN1a, cM0, G2, ER+, PR+, HER2-) - Signed by [name], MD on 08/24/2020   08/30/2020 Miscellaneous   MammaPrint high risk   09/13/2020 -  Chemotherapy   The patient had dexamethasone (DECADRON) 4 MG tablet, 4 mg (100 % of original dose 4 mg), Oral,  Once, 1 of 1 cycle, Start date: 09/04/2020, End date: 09/04/2020 Dose modification: 4 mg (original dose 4 mg, Cycle 0) DOXOrubicin (ADRIAMYCIN) chemo injection 130 mg, 60 mg/m2 = 130 mg, Intravenous,  Once, 0 of 4 cycles ondansetron (ZOFRAN) 16 mg in sodium chloride 0.9 % 50 mL IVPB, 16 mg (100 % of original dose 16 mg), Intravenous,  Once, 0 of 4 cycles Dose modification: 16 mg (original dose 16 mg, Cycle 1) cyclophosphamide (CYTOXAN) 1,300 mg in sodium chloride 0.9 % 250 mL chemo infusion, 600 mg/m2 = 1,300 mg, Intravenous,  Once, 0 of 4 cycles PACLitaxel (TAXOL) 174  mg in sodium chloride 0.9 % 250 mL chemo infusion (</= 44m/m2), 80 mg/m2 = 174 mg, Intravenous,  Once, 0 of 12 cycles fosaprepitant (EMEND) 150 mg in sodium chloride 0.9 % 145 mL IVPB, 150  mg, Intravenous,  Once, 0 of 4 cycles  for chemotherapy treatment.      CHIEF COMPLIANT: Cycle 1 Adriamycin and Cytoxan   INTERVAL HISTORY: Lindsay NEVERSis a 54y.o. with above-mentioned history of left breast cancer treated with lumpectomy and who is currently on adjuvant chemotherapy with dose dense Adriamycin and Cytoxan. Echo on 09/08/20 showed an ejection fraction of 60-65%. Her port was placed on 09/12/20. She presents to the clinic today for cycle 1.   ALLERGIES:  has No Known Allergies.  MEDICATIONS:  Current Outpatient Medications  Medication Sig Dispense Refill  . ALPRAZolam (XANAX) 0.25 MG tablet Take 1 tablet (0.25 mg total) by mouth 2 (two) times daily as needed for anxiety. 60 tablet 3  . dexamethasone (DECADRON) 4 MG tablet Take 4 mg by mouth daily.    .Marland Kitchenlidocaine-prilocaine (EMLA) cream Apply to affected area once (Patient taking differently: Apply 1 application topically as needed. Apply to affected area once) 30 g 3  . metroNIDAZOLE (METROCREAM) 0.75 % cream Apply 1 application topically once a week. Apply to face daily    . ondansetron (ZOFRAN) 8 MG tablet Take 1 tablet (8 mg total) by mouth 2 (two) times daily as needed. Start on the third day after chemotherapy. 30 tablet 1  . prochlorperazine (COMPAZINE) 10 MG tablet Take 1 tablet (10 mg total) by mouth every 6 (six) hours as needed (Nausea or vomiting). 30 tablet 1  . tamoxifen (NOLVADEX) 20 MG tablet TAKE 1 TABLET BY MOUTH EVERY DAY (Patient taking differently: Take 20 mg by mouth daily. ) 30 tablet 1  . traMADol (ULTRAM) 50 MG tablet Take 1 tablet (50 mg total) by mouth every 6 (six) hours as needed for moderate pain or severe pain. 12 tablet 0   No current facility-administered medications for this visit.    PHYSICAL EXAMINATION: ECOG PERFORMANCE STATUS: 1 - Symptomatic but completely ambulatory  There were no vitals filed for this visit. There were no vitals filed for this visit.  LABORATORY DATA:  I  have reviewed the data as listed CMP Latest Ref Rng & Units 08/14/2020 07/26/2020 11/17/2019  Glucose 70 - 99 mg/dL 117(H) 129(H) 96  BUN 6 - 20 mg/dL 17 11 12   Creatinine 0.44 - 1.00 mg/dL 1.04(H) 0.89 0.77  Sodium 135 - 145 mmol/L 138 137 137  Potassium 3.5 - 5.1 mmol/L 4.0 3.8 3.7  Chloride 98 - 111 mmol/L 103 101 103  CO2 22 - 32 mmol/L 25 26 20   Calcium 8.9 - 10.3 mg/dL 12.6(H) 11.0(H) 9.5  Total Protein 6.5 - 8.1 g/dL - 7.9 7.3  Total Bilirubin 0.3 - 1.2 mg/dL - 0.9 0.8  Alkaline Phos 38 - 126 U/L - 55 39  AST 15 - 41 U/L - 31 24  ALT 0 - 44 U/L - 68(H) 31    Lab Results  Component Value Date   WBC 8.3 09/07/2020   HGB 13.8 09/07/2020   HCT 41.2 09/07/2020   MCV 92.8 09/07/2020   PLT 309 09/07/2020   NEUTROABS 7.2 07/26/2020    ASSESSMENT & PLAN:  Malignant neoplasm of lower-outer quadrant of left breast of female, estrogen receptor positive ([place] 08/16/2020:Left lumpectomy ([name]: IDC, grade 2, 2.3cm, with intermediate grade DCIS, clear margins, 1/2 left axillary lymph  nodes positive for carcinoma.HER-2 equivocal by IHC (2+), negative by FISH, ER+ 95%, PR+40%, Ki67 15%. T2 N1 M0 stage Ib MammaPrint: High risk (average 10-year risk untreated: 29%, predicted benefit of chemo with hormone therapy at 5 years: 94.6%  Treatment plan:  1. Adjuvant chemotherapy with dose dense Adriamycin and Cytoxan x4 followed by Taxol x12 2. followed by adjuvant radiation 3.  Follow-up adjuvant antiestrogen therapy ------------------------------------------------------------------------------------------------------------------------------------ Current treatment: Cycle day 1 dose consider medicine Cytoxan Echocardiogram 09/08/2020: EF 60 to 65% Labs reviewed Chemo consent obtained, chemo education completed Antiemetics were reviewed  Return to clinic in 1 week for toxicity check    No orders of the defined types were placed in this encounter.  The patient has a good understanding  of the overall plan. she agrees with it. she will call with any problems that may develop before the next visit here.  Total time spent: 30 mins including face to face time and time spent for planning, charting and coordination of care  [name], MD 09/13/2020  I, Cloyde Reams Dorshimer, am acting as scribe for Dr. [name].  I have reviewed the above documentation for accuracy and completeness, and I agree with the above.

## 2020-09-12 NOTE — Op Note (Signed)
09/12/2020  8:25 AM  PATIENT:  Lindsay Hampton, 54 y.o., female MRN: [id number] DOB: [birth year]  PREOP DIAGNOSIS:  LEFT BREAST CANCER, anticipate chemotherapy  POSTOP DIAGNOSIS:   Left breast cancer, anticipate chemotherapy  PROCEDURE:   Procedure(s):  INSERTION Power port WITH ULTRASOUND guidance  SURGEON:   [name], M.D.  ANESTHESIA:   general  Anesthesiologist: Lidia Collum, MD CRNA: Gean Maidens, CRNA; Key, Kristopher, CRNA  General  EBL:  minimal  ml  COUNTS CORRECT:  YES  INDICATIONS FOR PROCEDURE:  Lindsay Hampton is a 54 y.o. (DOB: 05-16-1966) white female whose primary care physician is [name], FNP and comes for power port placement for the treatment of left breast cancer cancer.  Dr. Lindi Adie is her treating oncologist.   The indications and risks of the surgery were explained to the patient.  The risks include, but are not limited to, infection, bleeding, pneumothorax, nerve injury, and thrombosis of the vein.  OPERATIVE NOTE:  The patient was taken to OR room #1 at [facility].  Anesthesia was provided by Anesthesiologist: Lidia Collum, MD CRNA: Gean Maidens, CRNA; Key, Kristopher, CRNA.  At the beginning of the operation, the patient was given 2 gm Ancef, had a roll placed under her back, and had the upper chest/neck prepped with Chloroprep and draped.   A time out was held and the surgery checklist reviewed.   The patient was placed in Trendelenburg position.  The right internal jugular vein was accessed with a 16 gauge needle using ultrasound guidance and a guide wire threaded through the needle into the vein.  The position of the wire was checked with fluoroscopy.   I then developed a pocket in the upper inner aspect of the right chest for the port reservoir.  I injected 10 cc of 1/4% marcaine in the wounds.  I used the [employer] for venous access.  The reservoir was sewn in place with a 3-0 Vicryl suture.  The  reservoir had been flushed with dilute (10 units/cc) heparin.   I then passed the silastic tubing from the reservoir incision to the right subclavian incision and to the right internal jugular stick site.   I used the 8 [place] introducer to pass the tubing into the vein.  The tip of the silastic catheter was position at the junction of the SVC and the right atrium under fluoroscopy.  The silastic catheter was then cut and attached to the port with the bayonet device.     The entire port and tubing were checked with fluoroscopy.  She is to get chemotherapy tomorrow.  The wounds were then closed with 3-0 vicryl subcutaneous sutures and the skin closed with a 4-0 Monocryl suture.  The skin was painted with tincture of benzoin and steristripped.   I then accessed the port with a right angle Huber needle and flushed the catheter with 4 cc of concentrated heparin (100 units/cc).  The patient is to get her first chemotherapy tomorrow, so the port was left accessed.   The patient was transferred to the recovery room in good condition.  The sponge and needle count were correct at the end of the case.  A CXR is ordered for port placement and pending at the time of this note.  [name], MD, [facility] Office phone:  [phone]

## 2020-09-12 NOTE — Discharge Instructions (Signed)
[facility] - DISCHARGE INSTRUCTIONS TO PATIENT  Activity:  Driving - May drive in one or two days, if doing well                       Practice your Covid-19 protection:  Wear a mask, social distance, and wash your hands frequently  Wound Care:   Leave the incision dry for 2 days, then you may shower  Diet:  As tolerated  Follow up appointment:  Call Dr. Pollie Friar office [facility]) at [phone] for an appointment in 4 to 6 weeks.  Medications and dosages:  Resume your home medications. You have pain meds from the last operation.             You may also take Tylenol, ibuprofen, or Aleve for pain  Call Dr. [name] or his office  [phone]) if you have:  Temperature greater than 100.4,  Persistent nausea and vomiting,  Severe uncontrolled pain,  Redness, tenderness, or signs of infection (pain, swelling, redness, odor or green/yellow discharge around the site),  Difficulty breathing, headache or visual disturbances,  Any other questions or concerns you may have after discharge.  In an emergency, call 911 or go to an Emergency Department at a nearby hospital.

## 2020-09-12 NOTE — Anesthesia Procedure Notes (Signed)
Procedure Name: LMA Insertion Performed by: Lavina Hamman, CRNA Pre-anesthesia Checklist: Patient identified Patient Re-evaluated:Patient Re-evaluated prior to induction Oxygen Delivery Method: Circle system utilized Preoxygenation: Pre-oxygenation with 100% oxygen Induction Type: IV induction LMA: LMA inserted LMA Size: 4.0 Number of attempts: 1 Placement Confirmation: positive ETCO2 and breath sounds checked- equal and bilateral Tube secured with: Tape Dental Injury: Teeth and Oropharynx as per pre-operative assessment  Comments: Performed by Burnadette Pop

## 2020-09-13 ENCOUNTER — Encounter: Payer: Self-pay | Admitting: *Deleted

## 2020-09-13 ENCOUNTER — Inpatient Hospital Stay (HOSPITAL_BASED_OUTPATIENT_CLINIC_OR_DEPARTMENT_OTHER): Payer: 59 | Admitting: Hematology and Oncology

## 2020-09-13 ENCOUNTER — Other Ambulatory Visit: Payer: Self-pay

## 2020-09-13 ENCOUNTER — Inpatient Hospital Stay: Payer: 59

## 2020-09-13 ENCOUNTER — Encounter (HOSPITAL_COMMUNITY): Payer: Self-pay | Admitting: Surgery

## 2020-09-13 DIAGNOSIS — Z170 Estrogen receptor positive status [ER+]: Secondary | ICD-10-CM

## 2020-09-13 DIAGNOSIS — C50512 Malignant neoplasm of lower-outer quadrant of left female breast: Secondary | ICD-10-CM | POA: Diagnosis not present

## 2020-09-13 LAB — CBC WITH DIFFERENTIAL (CANCER CENTER ONLY)
Abs Immature Granulocytes: 0.04 10*3/uL (ref 0.00–0.07)
Basophils Absolute: 0.1 10*3/uL (ref 0.0–0.1)
Basophils Relative: 1 %
Eosinophils Absolute: 0.1 10*3/uL (ref 0.0–0.5)
Eosinophils Relative: 1 %
HCT: 35.5 % — ABNORMAL LOW (ref 36.0–46.0)
Hemoglobin: 11.8 g/dL — ABNORMAL LOW (ref 12.0–15.0)
Immature Granulocytes: 0 %
Lymphocytes Relative: 34 %
Lymphs Abs: 3.4 10*3/uL (ref 0.7–4.0)
MCH: 30.6 pg (ref 26.0–34.0)
MCHC: 33.2 g/dL (ref 30.0–36.0)
MCV: 92 fL (ref 80.0–100.0)
Monocytes Absolute: 0.7 10*3/uL (ref 0.1–1.0)
Monocytes Relative: 7 %
Neutro Abs: 5.9 10*3/uL (ref 1.7–7.7)
Neutrophils Relative %: 57 %
Platelet Count: 265 10*3/uL (ref 150–400)
RBC: 3.86 MIL/uL — ABNORMAL LOW (ref 3.87–5.11)
RDW: 12.7 % (ref 11.5–15.5)
WBC Count: 10.2 10*3/uL (ref 4.0–10.5)
nRBC: 0 % (ref 0.0–0.2)

## 2020-09-13 LAB — CMP (CANCER CENTER ONLY)
ALT: 50 U/L — ABNORMAL HIGH (ref 0–44)
AST: 23 U/L (ref 15–41)
Albumin: 3.6 g/dL (ref 3.5–5.0)
Alkaline Phosphatase: 37 U/L — ABNORMAL LOW (ref 38–126)
Anion gap: 7 (ref 5–15)
BUN: 11 mg/dL (ref 6–20)
CO2: 22 mmol/L (ref 22–32)
Calcium: 9.3 mg/dL (ref 8.9–10.3)
Chloride: 109 mmol/L (ref 98–111)
Creatinine: 0.78 mg/dL (ref 0.44–1.00)
GFR, Estimated: >60 mL/min (ref >60)
Glucose, Bld: 90 mg/dL (ref 70–99)
Potassium: 3.8 mmol/L (ref 3.5–5.1)
Sodium: 138 mmol/L (ref 135–145)
Total Bilirubin: 0.5 mg/dL (ref 0.3–1.2)
Total Protein: 6.7 g/dL (ref 6.5–8.1)

## 2020-09-13 MED ORDER — SODIUM CHLORIDE 0.9 % IV SOLN
16.0000 mg | Freq: Once | INTRAVENOUS | Status: AC
  Administered 2020-09-13: 16 mg via INTRAVENOUS
  Filled 2020-09-13: qty 8

## 2020-09-13 MED ORDER — SODIUM CHLORIDE 0.9 % IV SOLN
600.0000 mg/m2 | Freq: Once | INTRAVENOUS | Status: AC
  Administered 2020-09-13: 1300 mg via INTRAVENOUS
  Filled 2020-09-13: qty 65

## 2020-09-13 MED ORDER — SODIUM CHLORIDE 0.9 % IV SOLN
10.0000 mg | Freq: Once | INTRAVENOUS | Status: AC
  Administered 2020-09-13: 10 mg via INTRAVENOUS
  Filled 2020-09-13: qty 10

## 2020-09-13 MED ORDER — HEPARIN SOD (PORK) LOCK FLUSH 100 UNIT/ML IV SOLN
500.0000 [IU] | Freq: Once | INTRAVENOUS | Status: AC | PRN
  Administered 2020-09-13: 500 [IU]
  Filled 2020-09-13: qty 5

## 2020-09-13 MED ORDER — DOXORUBICIN HCL CHEMO IV INJECTION 2 MG/ML
60.0000 mg/m2 | Freq: Once | INTRAVENOUS | Status: AC
  Administered 2020-09-13: 130 mg via INTRAVENOUS
  Filled 2020-09-13: qty 65

## 2020-09-13 MED ORDER — SODIUM CHLORIDE 0.9 % IV SOLN
Freq: Once | INTRAVENOUS | Status: AC
  Filled 2020-09-13: qty 250

## 2020-09-13 MED ORDER — SODIUM CHLORIDE 0.9% FLUSH
10.0000 mL | INTRAVENOUS | Status: DC | PRN
  Administered 2020-09-13: 10 mL
  Filled 2020-09-13: qty 10

## 2020-09-13 MED ORDER — SODIUM CHLORIDE 0.9 % IV SOLN
150.0000 mg | Freq: Once | INTRAVENOUS | Status: AC
  Administered 2020-09-13: 150 mg via INTRAVENOUS
  Filled 2020-09-13: qty 150

## 2020-09-13 NOTE — Patient Instructions (Signed)
[facility] Discharge Instructions for Patients Receiving Chemotherapy  Today you received the following chemotherapy agents: Doxorubicin, cyclophosphamide   To help prevent nausea and vomiting after your treatment, we encourage you to take your nausea medication as needed.    If you develop nausea and vomiting that is not controlled by your nausea medication, call the clinic.   BELOW ARE SYMPTOMS THAT SHOULD BE REPORTED IMMEDIATELY:  *FEVER GREATER THAN 100.5 F  *CHILLS WITH OR WITHOUT FEVER  NAUSEA AND VOMITING THAT IS NOT CONTROLLED WITH YOUR NAUSEA MEDICATION  *UNUSUAL SHORTNESS OF BREATH  *UNUSUAL BRUISING OR BLEEDING  TENDERNESS IN MOUTH AND THROAT WITH OR WITHOUT PRESENCE OF ULCERS  *URINARY PROBLEMS  *BOWEL PROBLEMS  UNUSUAL RASH Items with * indicate a potential emergency and should be followed up as soon as possible.  Feel free to call the clinic should you have any questions or concerns. The clinic phone number is [phone].  Please show the [place] at check-in to the Emergency Department and triage nurse.  Doxorubicin injection What is this medicine? DOXORUBICIN (dox oh ROO bi sin) is a chemotherapy drug. It is used to treat many kinds of cancer like leukemia, lymphoma, neuroblastoma, sarcoma, and Wilms' tumor. It is also used to treat bladder cancer, breast cancer, lung cancer, ovarian cancer, stomach cancer, and thyroid cancer. This medicine may be used for other purposes; ask your health care provider or pharmacist if you have questions. COMMON BRAND NAME(S): Adriamycin, Adriamycin PFS, Adriamycin RDF, Rubex What should I tell my health care provider before I take this medicine? They need to know if you have any of these conditions:  heart disease  history of low blood counts caused by a medicine  liver disease  recent or ongoing radiation therapy  an unusual or allergic reaction to doxorubicin, other chemotherapy agents,  other medicines, foods, dyes, or preservatives  pregnant or trying to get pregnant  breast-feeding How should I use this medicine? This drug is given as an infusion into a vein. It is administered in a hospital or clinic by a specially trained health care professional. If you have pain, swelling, burning or any unusual feeling around the site of your injection, tell your health care professional right away. Talk to your pediatrician regarding the use of this medicine in children. Special care may be needed. Overdosage: If you think you have taken too much of this medicine contact a poison control center or emergency room at once. NOTE: This medicine is only for you. Do not share this medicine with others. What if I miss a dose? It is important not to miss your dose. Call your doctor or health care professional if you are unable to keep an appointment. What may interact with this medicine? This medicine may interact with the following medications:  6-mercaptopurine  paclitaxel  phenytoin  St. John's Wort  trastuzumab  verapamil This list may not describe all possible interactions. Give your health care provider a list of all the medicines, herbs, non-prescription drugs, or dietary supplements you use. Also tell them if you smoke, drink alcohol, or use illegal drugs. Some items may interact with your medicine. What should I watch for while using this medicine? This drug may make you feel generally unwell. This is not uncommon, as chemotherapy can affect healthy cells as well as cancer cells. Report any side effects. Continue your course of treatment even though you feel ill unless your doctor tells you to stop. There is a maximum  amount of this medicine you should receive throughout your life. The amount depends on the medical condition being treated and your overall health. Your doctor will watch how much of this medicine you receive in your lifetime. Tell your doctor if you have taken  this medicine before. You may need blood work done while you are taking this medicine. Your urine may turn red for a few days after your dose. This is not blood. If your urine is dark or brown, call your doctor. In some cases, you may be given additional medicines to help with side effects. Follow all directions for their use. Call your doctor or health care professional for advice if you get a fever, chills or sore throat, or other symptoms of a cold or flu. Do not treat yourself. This drug decreases your body's ability to fight infections. Try to avoid being around people who are sick. This medicine may increase your risk to bruise or bleed. Call your doctor or health care professional if you notice any unusual bleeding. Talk to your doctor about your risk of cancer. You may be more at risk for certain types of cancers if you take this medicine. Do not become pregnant while taking this medicine or for 6 months after stopping it. Women should inform their doctor if they wish to become pregnant or think they might be pregnant. Men should not father a child while taking this medicine and for 6 months after stopping it. There is a potential for serious side effects to an unborn child. Talk to your health care professional or pharmacist for more information. Do not breast-feed an infant while taking this medicine. This medicine has caused ovarian failure in some women and reduced sperm counts in some men This medicine may interfere with the ability to have a child. Talk with your doctor or health care professional if you are concerned about your fertility. This medicine may cause a decrease in Co-Enzyme Q-10. You should make sure that you get enough Co-Enzyme Q-10 while you are taking this medicine. Discuss the foods you eat and the vitamins you take with your health care professional. What side effects may I notice from receiving this medicine? Side effects that you should report to your doctor or health  care professional as soon as possible:  allergic reactions like skin rash, itching or hives, swelling of the face, lips, or tongue  breathing problems  chest pain  fast or irregular heartbeat  low blood counts - this medicine may decrease the number of white blood cells, red blood cells and platelets. You may be at increased risk for infections and bleeding.  pain, redness, or irritation at site where injected  signs of infection - fever or chills, cough, sore throat, pain or difficulty passing urine  signs of decreased platelets or bleeding - bruising, pinpoint red spots on the skin, black, tarry stools, blood in the urine  swelling of the ankles, feet, hands  tiredness  weakness Side effects that usually do not require medical attention (report to your doctor or health care professional if they continue or are bothersome):  diarrhea  hair loss  mouth sores  nail discoloration or damage  nausea  red colored urine  vomiting This list may not describe all possible side effects. Call your doctor for medical advice about side effects. You may report side effects to FDA at 1-800-FDA-1088. Where should I keep my medicine? This drug is given in a hospital or clinic and will not be stored at  home. NOTE: This sheet is a summary. It may not cover all possible information. If you have questions about this medicine, talk to your doctor, pharmacist, or health care provider.  2020 Elsevier/Gold Standard (2017-06-25 11:01:26)  Cyclophosphamide Injection What is this medicine? CYCLOPHOSPHAMIDE (sye kloe FOSS fa mide) is a chemotherapy drug. It slows the growth of cancer cells. This medicine is used to treat many types of cancer like lymphoma, myeloma, leukemia, breast cancer, and ovarian cancer, to name a few. This medicine may be used for other purposes; ask your health care provider or pharmacist if you have questions. COMMON BRAND NAME(S): Cytoxan, Neosar What should I tell my  health care provider before I take this medicine? They need to know if you have any of these conditions:  heart disease  history of irregular heartbeat  infection  kidney disease  liver disease  low blood counts, like white cells, platelets, or red blood cells  on hemodialysis  recent or ongoing radiation therapy  scarring or thickening of the lungs  trouble passing urine  an unusual or allergic reaction to cyclophosphamide, other medicines, foods, dyes, or preservatives  pregnant or trying to get pregnant  breast-feeding How should I use this medicine? This drug is usually given as an injection into a vein or muscle or by infusion into a vein. It is administered in a hospital or clinic by a specially trained health care professional. Talk to your pediatrician regarding the use of this medicine in children. Special care may be needed. Overdosage: If you think you have taken too much of this medicine contact a poison control center or emergency room at once. NOTE: This medicine is only for you. Do not share this medicine with others. What if I miss a dose? It is important not to miss your dose. Call your doctor or health care professional if you are unable to keep an appointment. What may interact with this medicine?  amphotericin B  azathioprine  certain antivirals for HIV or hepatitis  certain medicines for blood pressure, heart disease, irregular heart beat  certain medicines that treat or prevent blood clots like warfarin  certain other medicines for cancer  cyclosporine  etanercept  indomethacin  medicines that relax muscles for surgery  medicines to increase blood counts  metronidazole This list may not describe all possible interactions. Give your health care provider a list of all the medicines, herbs, non-prescription drugs, or dietary supplements you use. Also tell them if you smoke, drink alcohol, or use illegal drugs. Some items may interact with  your medicine. What should I watch for while using this medicine? Your condition will be monitored carefully while you are receiving this medicine. You may need blood work done while you are taking this medicine. Drink water or other fluids as directed. Urinate often, even at night. Some products may contain alcohol. Ask your health care professional if this medicine contains alcohol. Be sure to tell all health care professionals you are taking this medicine. Certain medicines, like metronidazole and disulfiram, can cause an unpleasant reaction when taken with alcohol. The reaction includes flushing, headache, nausea, vomiting, sweating, and increased thirst. The reaction can last from 30 minutes to several hours. Do not become pregnant while taking this medicine or for 1 year after stopping it. Women should inform their health care professional if they wish to become pregnant or think they might be pregnant. Men should not father a child while taking this medicine and for 4 months after stopping it. There  is potential for serious side effects to an unborn child. Talk to your health care professional for more information. Do not breast-feed an infant while taking this medicine or for 1 week after stopping it. This medicine has caused ovarian failure in some women. This medicine may make it more difficult to get pregnant. Talk to your health care professional if you are concerned about your fertility. This medicine has caused decreased sperm counts in some men. This may make it more difficult to father a child. Talk to your health care professional if you are concerned about your fertility. Call your health care professional for advice if you get a fever, chills, or sore throat, or other symptoms of a cold or flu. Do not treat yourself. This medicine decreases your body's ability to fight infections. Try to avoid being around people who are sick. Avoid taking medicines that contain aspirin, acetaminophen,  ibuprofen, naproxen, or ketoprofen unless instructed by your health care professional. These medicines may hide a fever. Talk to your health care professional about your risk of cancer. You may be more at risk for certain types of cancer if you take this medicine. If you are going to need surgery or other procedure, tell your health care professional that you are using this medicine. Be careful brushing or flossing your teeth or using a toothpick because you may get an infection or bleed more easily. If you have any dental work done, tell your dentist you are receiving this medicine. What side effects may I notice from receiving this medicine? Side effects that you should report to your doctor or health care professional as soon as possible:  allergic reactions like skin rash, itching or hives, swelling of the face, lips, or tongue  breathing problems  nausea, vomiting  signs and symptoms of bleeding such as bloody or black, tarry stools; red or dark brown urine; spitting up blood or brown material that looks like coffee grounds; red spots on the skin; unusual bruising or bleeding from the eyes, gums, or nose  signs and symptoms of heart failure like fast, irregular heartbeat, sudden weight gain; swelling of the ankles, feet, hands  signs and symptoms of infection like fever; chills; cough; sore throat; pain or trouble passing urine  signs and symptoms of kidney injury like trouble passing urine or change in the amount of urine  signs and symptoms of liver injury like dark yellow or brown urine; general ill feeling or flu-like symptoms; light-colored stools; loss of appetite; nausea; right upper belly pain; unusually weak or tired; yellowing of the eyes or skin Side effects that usually do not require medical attention (report to your doctor or health care professional if they continue or are bothersome):  confusion  decreased hearing  diarrhea  facial flushing  hair  loss  headache  loss of appetite  missed menstrual periods  signs and symptoms of low red blood cells or anemia such as unusually weak or tired; feeling faint or lightheaded; falls  skin discoloration This list may not describe all possible side effects. Call your doctor for medical advice about side effects. You may report side effects to FDA at 1-800-FDA-1088. Where should I keep my medicine? This drug is given in a hospital or clinic and will not be stored at home. NOTE: This sheet is a summary. It may not cover all possible information. If you have questions about this medicine, talk to your doctor, pharmacist, or health care provider.  2020 Elsevier/Gold Standard (2019-08-16 09:53:29)

## 2020-09-13 NOTE — Assessment & Plan Note (Signed)
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 T2 N1 M0 stage Ib MammaPrint: High risk (average 10-year risk untreated: 29%, predicted benefit of chemo with hormone therapy at 5 years: 94.6%  Treatment plan:  1. Adjuvant chemotherapy with dose dense Adriamycin and Cytoxan x4 followed by Taxol x12 2. followed by adjuvant radiation 3.  Follow-up adjuvant antiestrogen therapy ------------------------------------------------------------------------------------------------------------------------------------ Current treatment: Cycle day 1 dose consider medicine Cytoxan Echocardiogram 09/08/2020: EF 60 to 65% Labs reviewed Chemo consent obtained, chemo education completed Antiemetics were reviewed  Return to clinic in 1 week for toxicity check

## 2020-09-14 ENCOUNTER — Other Ambulatory Visit: Payer: Self-pay | Admitting: *Deleted

## 2020-09-14 ENCOUNTER — Ambulatory Visit: Payer: 59 | Attending: Surgery | Admitting: Physical Therapy

## 2020-09-14 ENCOUNTER — Encounter: Payer: Self-pay | Admitting: Physical Therapy

## 2020-09-14 ENCOUNTER — Encounter: Payer: Self-pay | Admitting: *Deleted

## 2020-09-14 ENCOUNTER — Telehealth: Payer: Self-pay | Admitting: Hematology and Oncology

## 2020-09-14 ENCOUNTER — Telehealth: Payer: Self-pay | Admitting: *Deleted

## 2020-09-14 DIAGNOSIS — M25512 Pain in left shoulder: Secondary | ICD-10-CM | POA: Diagnosis present

## 2020-09-14 DIAGNOSIS — M25612 Stiffness of left shoulder, not elsewhere classified: Secondary | ICD-10-CM

## 2020-09-14 DIAGNOSIS — R293 Abnormal posture: Secondary | ICD-10-CM | POA: Diagnosis present

## 2020-09-14 DIAGNOSIS — Z170 Estrogen receptor positive status [ER+]: Secondary | ICD-10-CM | POA: Diagnosis present

## 2020-09-14 DIAGNOSIS — C50512 Malignant neoplasm of lower-outer quadrant of left female breast: Secondary | ICD-10-CM

## 2020-09-14 DIAGNOSIS — G8929 Other chronic pain: Secondary | ICD-10-CM | POA: Insufficient documentation

## 2020-09-14 NOTE — Telephone Encounter (Signed)
No 10/20 los, no changes made to pt schedule

## 2020-09-14 NOTE — Patient Instructions (Signed)
            [facility]         [address], [place] [address]         [phone]         Annia Friendly, PT, CLT   After Breast Cancer Class It is recommended you attend the ABC class to be educated on lymphedema risk reduction. This class is free of charge and lasts for 1 hour. It is a 1-time class.  You are scheduled for Nov. 15th at 11:00.  Scar massage You can begin gentle scar massage with coconut oil or vitamin E cream - a few minutes each day to you left breast and axillary incision.   Home exercise Program Continue the stretches - we will add on for rotator cuff rehab.   Follow up PT: It is recommended you return every 3 months for the first 3 years following surgery to be assessed on the SOZO machine for an L-Dex score. This helps prevent clinically significant lymphedema in 95% of patients. These follow up screens are 15 minute appointments that you are not billed for. You are scheduled for Dec. 27th, 2022 at 8:30.

## 2020-09-14 NOTE — Therapy (Signed)
[place], [place], [address] Phone: [phone]   Fax:  [phone]  Physical Therapy Treatment  Patient Details  Name: Lindsay Hampton MRN: [id number] Date of Birth: Dec 17, 1965 Referring Provider (PT): Dr. [name]   Encounter Date: 09/14/2020   PT End of Session - 09/14/20 1104    Visit Number 2    Number of Visits 10    Date for PT Re-Evaluation 10/12/20    PT Start Time 1030    PT Stop Time 1104    PT Time Calculation (min) 34 min    Activity Tolerance Patient tolerated treatment well    Behavior During Therapy [facility] for tasks assessed/performed           Past Medical History:  Diagnosis Date  . Allergy    seasonal  . Anemia    After childbirth  . Anxiety   . Breast cancer ([place])   . Family history of breast cancer 07/26/2020  . Family history of colon cancer 07/26/2020  . Family history of prostate cancer 07/26/2020  . GERD (gastroesophageal reflux disease)   . Pneumonia    In 20s    Past Surgical History:  Procedure Laterality Date  . AXILLARY LYMPH NODE DISSECTION Left 08/16/2020   Procedure: LEFT TARGETED AXILLARY LYMPH NODE DISSECTION;  Surgeon: [name], MD;  Location: [place];  Service: General;  Laterality: Left;  . BREAST LUMPECTOMY WITH RADIOACTIVE SEED AND AXILLARY LYMPH NODE DISSECTION Left 08/16/2020   Procedure: LEFT BREAST LUMPECTOMY WITH RADIOACTIVE SEED;  Surgeon: [name], MD;  Location: [place];  Service: General;  Laterality: Left;  PEC BLOCK  . BREAST SURGERY Left 06/2020   breast bx   . CERVICAL BIOPSY  W/ LOOP ELECTRODE EXCISION  12/2017  . CESAREAN SECTION    . DILATION AND CURETTAGE OF UTERUS    . PORTACATH PLACEMENT  09/12/2020   Procedure: INSERTION PORT-A-CATH WITH ULTRASOUND;  Surgeon: [name], MD;  Location[facility];  Service: General;;  . WISDOM TOOTH EXTRACTION  2020    There were no vitals filed for this visit.   Subjective Assessment -  09/14/20 1037    Subjective Patient reports she underwent a left lumpectomy and sentinel node biopsy (1/2 nodes positive) on 08/16/2020. Chemo began 09/13/2020 and will be followed by left breast and axillary radiation and anti-estrogen therapy.    Pertinent History Patient was diagnosed on 07/06/2020 with left grade II invasive ductal carcinoma breast cancer. Patient reports she underwent a left lumpectomy and sentinel node biopsy (1/2 nodes positive) on 08/16/2020. It is ER/PR positive and HER2 negative with a Ki67 of 15%. She has a positive axillary lymph node.    Patient Stated Goals See how my arm is doing    Currently in Pain? No/denies              [facility] PT Assessment - 09/14/20 0001      Assessment   Medical Diagnosis s/p left lumpectomy and SLNB    Referring Provider (PT) Dr. [name]    Onset Date/Surgical Date 08/16/20    Hand Dominance Right    Prior Therapy Baselines      Precautions   Precautions Other (comment)    Precaution Comments recent surgery and left arm lymphedema risk      Restrictions   Weight Bearing Restrictions No      Balance Screen   Has the patient fallen in the past 6 months No  Has the patient had a decrease in activity level because of a fear of falling?  No    Is the patient reluctant to leave their home because of a fear of falling?  No      Home Ecologist residence    Living Arrangements Spouse/significant other    Available Help at Discharge Family      Prior Function   Level of Independence Independent    Vocation Unemployed    Leisure She is walking 30 min/day      Cognition   Overall Cognitive Status Within Functional Limits for tasks assessed      Observation/Other Assessments   Observations Left axillary and breast incisions both appear to be healing well with no sign of infection. Mild scar tissue present just superior and lateral to nipple. Tender to palpation left acromion and left bicipital  groove.      Posture/Postural Control   Posture/Postural Control Postural limitations    Postural Limitations Rounded Shoulders;Forward head      ROM / Strength   AROM / PROM / Strength AROM      AROM   Overall AROM Comments Some c/o left shoulder pain with AROM measurements    AROM Assessment Site Shoulder    Right/Left Shoulder Left    Left Shoulder Extension 42 Degrees    Left Shoulder Flexion 133 Degrees    Left Shoulder ABduction 137 Degrees    Left Shoulder Internal Rotation 77 Degrees    Left Shoulder External Rotation 69 Degrees      Strength   Overall Strength Within functional limits for tasks performed      Special Tests    Special Tests Rotator Cuff Impingement    Rotator Cuff Impingment tests Empty Can test      Empty Can test   Findings Positive    Side Left    Comment Pain and weakness             LYMPHEDEMA/ONCOLOGY QUESTIONNAIRE - 09/14/20 0001      Type   Cancer Type Left breast cancer      Surgeries   Lumpectomy Date 08/16/20    Sentinel Lymph Node Biopsy Date 08/16/20    Number Lymph Nodes Removed 2      Treatment   Active Chemotherapy Treatment Yes    Date 09/13/20    Past Chemotherapy Treatment No    Active Radiation Treatment No    Past Radiation Treatment No    Current Hormone Treatment No    Past Hormone Therapy No      What other symptoms do you have   Are you Having Heaviness or Tightness No    Are you having Pain No    Are you having pitting edema No    Is it Hard or Difficult finding clothes that fit No    Do you have infections No    Is there Decreased scar mobility No    Stemmer Sign No      Lymphedema Assessments   Lymphedema Assessments Upper extremities      Right Upper Extremity Lymphedema   10 cm Proximal to Olecranon Process 35 cm    Olecranon Process 26.7 cm    10 cm Proximal to Ulnar Styloid Process 24.7 cm    Just Proximal to Ulnar Styloid Process 18.3 cm    Across Hand at [employer] 20.1 cm    At  [place] of 2nd Digit 6.5 cm  Left Upper Extremity Lymphedema   10 cm Proximal to Olecranon Process 36.7 cm    Olecranon Process 26.8 cm    10 cm Proximal to Ulnar Styloid Process 24.5 cm    Just Proximal to Ulnar Styloid Process 18 cm    Across Hand at [employer] 29.7 cm    At [place] of 2nd Digit 6.6 cm              Quick Dash - 09/14/20 0001    Open a tight or new jar No difficulty    Do heavy household chores (wash walls, wash floors) No difficulty    Carry a shopping bag or briefcase No difficulty    Wash your back No difficulty    Use a knife to cut food No difficulty    Recreational activities in which you take some force or impact through your arm, shoulder, or hand (golf, hammering, tennis) No difficulty    During the past week, to what extent has your arm, shoulder or hand problem interfered with your normal social activities with family, friends, neighbors, or groups? Not at all    During the past week, to what extent has your arm, shoulder or hand problem limited your work or other regular daily activities Not at all    Arm, shoulder, or hand pain. Mild    Tingling (pins and needles) in your arm, shoulder, or hand None    Difficulty Sleeping No difficulty    DASH Score 2.27 %                               PT Long Term Goals - 09/14/20 1109      PT LONG TERM GOAL #1   Title Patient will demonstrate she has regained full shoulder ROM and function post operatively compared to baseline measurements.    Time 4    Period Weeks    Status On-going    Target Date 10/12/20      PT LONG TERM GOAL #2   Title Patient will report >/= 25% reduction in left shoulder pain while performing daily tasks.    Time 4    Period Weeks    Status New    Target Date 10/12/20      PT LONG TERM GOAL #3   Title Patient will report >/= 25% improvement in her ability to sleep at night    Time 4    Period Weeks    Status New    Target Date 10/12/20      PT LONG  TERM GOAL #4   Title Patient will increase left shoulder active flexion and abduction to >/= 145 degrees for increased each reaching overhead.    Time 4    Period Weeks    Status New    Target Date 10/12/20                 Plan - 09/14/20 1105    Clinical Impression Statement Patient is doing very well s/p left lumpectomy and SLNB. She had 1 of 2 axillary nodes test positive for cancer. She is currently undergoing chemotherapy (began yesterday) and will undergo radiation and anti-estrogen therapy. Her shoulder ROM is lacking but appears to be due to a chronic rotator cuff issue which appears to be impingement rather than a post surgical issue. There are no signs of lymphedema and incisions appear to be healing well. She plans to attend the  ABC class on 10/09/2021. She will benefit from PT now to address her left rotator cuff issue. She has shoulder ROM tightness and pain with certain activities.    PT Frequency 2x / week    PT Duration 4 weeks    PT Treatment/Interventions ADLs/Self Care Home Management;Therapeutic exercise;Patient/family education;Iontophoresis 74m/ml Dexamethasone;Manual techniques;Joint Manipulations;Passive range of motion    PT Next Visit Plan Begin left shoulder rehab for possible impingement. Ionto if MD signs cert. PROM, AROM exercises, cross friction massage to left anterior shoulder.    PT Home Exercise Plan Post op shoulder ROM HEP    Consulted and Agree with Plan of Care Patient           Patient will benefit from skilled therapeutic intervention in order to improve the following deficits and impairments:  Postural dysfunction, Decreased knowledge of precautions, Impaired UE functional use, Pain, Decreased range of motion  Visit Diagnosis: Malignant neoplasm of lower-outer quadrant of left breast of female, estrogen receptor positive ([place] - Plan: PT plan of care cert/re-cert  Abnormal posture - Plan: PT plan of care cert/re-cert  Stiffness of left  shoulder, not elsewhere classified - Plan: PT plan of care cert/re-cert  Chronic left shoulder pain - Plan: PT plan of care cert/re-cert     Problem List Patient Active Problem List   Diagnosis Date Noted  . Genetic testing 08/01/2020  . Family history of breast cancer 07/26/2020  . Family history of prostate cancer 07/26/2020  . Family history of colon cancer 07/26/2020  . Malignant neoplasm of lower-outer quadrant of left breast of female, estrogen receptor positive ([facility] 07/20/2020   MAnnia Friendly PT 09/14/20 11:12 AM  [place] [address]Phone: [phone]  Fax:  [phone] Name: Lindsay ERTLMRN: [id number] of Birth: 7August 28, 1967

## 2020-09-14 NOTE — Progress Notes (Signed)
RN placed call to pt to f/u day after treatment.  Pt states she feels great and has no complaints at this time.  RN reviewed nausea medications and encouraged pt to increase fluid intake.  Pt verbalized understanding and appreciative of the call.

## 2020-09-14 NOTE — Telephone Encounter (Signed)
TCT patient s/p 1st treatment  With Adriamycin, cytoxan,  Pt states she did very well.  She drank a lot of fluids, went for a walk, took her nausea medicine before bed. She states she slept well.  Denies fever, chills, nausea, diarrhea.  She states she feels good today.

## 2020-09-15 ENCOUNTER — Ambulatory Visit: Payer: 59

## 2020-09-15 NOTE — Addendum Note (Signed)
Addended by: Toribio Harbour on: 09/15/2020 09:39 AM   Modules accepted: Orders

## 2020-09-18 ENCOUNTER — Other Ambulatory Visit: Payer: Self-pay | Admitting: Hematology and Oncology

## 2020-09-19 ENCOUNTER — Other Ambulatory Visit: Payer: Self-pay

## 2020-09-19 ENCOUNTER — Ambulatory Visit: Payer: 59

## 2020-09-19 DIAGNOSIS — M25612 Stiffness of left shoulder, not elsewhere classified: Secondary | ICD-10-CM

## 2020-09-19 DIAGNOSIS — C50512 Malignant neoplasm of lower-outer quadrant of left female breast: Secondary | ICD-10-CM

## 2020-09-19 DIAGNOSIS — G8929 Other chronic pain: Secondary | ICD-10-CM

## 2020-09-19 DIAGNOSIS — M25512 Pain in left shoulder: Secondary | ICD-10-CM

## 2020-09-19 DIAGNOSIS — R293 Abnormal posture: Secondary | ICD-10-CM

## 2020-09-19 DIAGNOSIS — Z170 Estrogen receptor positive status [ER+]: Secondary | ICD-10-CM

## 2020-09-19 NOTE — Patient Instructions (Signed)
Access Code: [email or web address] URL: [email or web address] Date: 09/19/2020 Prepared by: Cheral Almas  Exercises Standing Shoulder Row with Anchored Resistance - 1 x daily - 7 x weekly - 1 sets - 10 reps Shoulder Extension with Resistance - 1 x daily - 7 x weekly - 1 sets - 10 reps Shoulder Internal Rotation with Resistance - 1 x daily - 7 x weekly - 1 sets - 10 reps Shoulder External Rotation and Scapular Retraction with Resistance - 1 x daily - 7 x weekly - 1 sets - 10 reps Sidelying Shoulder External Rotation AROM - 1 x daily - 7 x weekly - 1 sets - 10 reps Supine Single Arm Shoulder Protraction - 1 x daily - 7 x weekly - 1 sets - 10 reps Pt. Advised to perform exercises in ROM without increased pain.  May use gentle ice at home as needed for pain

## 2020-09-19 NOTE — Therapy (Signed)
[place], [place], [address] Phone: [phone]   Fax:  [phone]  Physical Therapy Treatment  Patient Details  Name: Lindsay Hampton MRN: [id number] Date of Birth: 02/18/1966 Referring Provider (PT): Dr. [name]   Encounter Date: 09/19/2020   PT End of Session - 09/19/20 1730    Visit Number 3    Number of Visits 10    Date for PT Re-Evaluation 10/12/20    PT Start Time 4259    PT Stop Time 1448    PT Time Calculation (min) 54 min    Activity Tolerance Patient tolerated treatment well    Behavior During Therapy [facility] for tasks assessed/performed           Past Medical History:  Diagnosis Date  . Allergy    seasonal  . Anemia    After childbirth  . Anxiety   . Breast cancer ([place])   . Family history of breast cancer 07/26/2020  . Family history of colon cancer 07/26/2020  . Family history of prostate cancer 07/26/2020  . GERD (gastroesophageal reflux disease)   . Pneumonia    In 20s    Past Surgical History:  Procedure Laterality Date  . AXILLARY LYMPH NODE DISSECTION Left 08/16/2020   Procedure: LEFT TARGETED AXILLARY LYMPH NODE DISSECTION;  Surgeon: [name], MD;  Location: [place];  Service: General;  Laterality: Left;  . BREAST LUMPECTOMY WITH RADIOACTIVE SEED AND AXILLARY LYMPH NODE DISSECTION Left 08/16/2020   Procedure: LEFT BREAST LUMPECTOMY WITH RADIOACTIVE SEED;  Surgeon: [name], MD;  Location: [place];  Service: General;  Laterality: Left;  PEC BLOCK  . BREAST SURGERY Left 06/2020   breast bx   . CERVICAL BIOPSY  W/ LOOP ELECTRODE EXCISION  12/2017  . CESAREAN SECTION    . DILATION AND CURETTAGE OF UTERUS    . PORTACATH PLACEMENT  09/12/2020   Procedure: INSERTION PORT-A-CATH WITH ULTRASOUND;  Surgeon: [name], MD;  [facility];  Service: General;;  . WISDOM TOOTH EXTRACTION  2020    There were no vitals filed for this visit.   Subjective Assessment -  09/19/20 1353    Subjective Did well after last visit. Still having pain with raising her arm or to lay on it.  think I did it lifting heavy furniture in August before I ever found out about my cancer diagnosis    Pertinent History Patient was diagnosed on 07/06/2020 with left grade II invasive ductal carcinoma breast cancer. Patient reports she underwent a left lumpectomy and sentinel node biopsy (1/2 nodes positive) on 08/16/2020. It is ER/PR positive and HER2 negative with a Ki67 of 15%. She has a positive axillary lymph node.    Currently in Pain? Yes    Pain Score 4     Pain Location Shoulder    Pain Orientation Left    Pain Descriptors / Indicators Constant;Aching    Pain Onset More than a month ago    Pain Frequency Constant    Aggravating Factors  raising arm, reaching to side, reaching behind                             [facility] Adult PT Treatment/Exercise - 09/19/20 0001      Neuro Re-ed    Neuro Re-ed Details  alternating isometrics IR/ER  3 positions x 20 sec, Rhythmic stabs 90 deg flexion x 30      Shoulder  Exercises: Supine   Protraction AROM;Strengthening;Left;10 reps    Other Supine Exercises supine ABC's      Shoulder Exercises: Standing   External Rotation Strengthening;10 reps   SL AROM   Theraband Level (Shoulder External Rotation) Level 1 (Yellow)    External Rotation Weight (lbs) 10 reps    Internal Rotation Strengthening;Left;10 reps    Theraband Level (Shoulder Internal Rotation) Level 1 (Yellow)    Internal Rotation Weight (lbs) --   10 reps   Extension Strengthening;10 reps;Left    Theraband Level (Shoulder Extension) Level 1 (Yellow)    Row 10 reps    Theraband Level (Shoulder Row) Level 1 (Yellow)      Iontophoresis   Type of Iontophoresis Dexamethasone    Location Left proximal biceps    Dose 21m/ml    Time 4 hr wear time      Manual Therapy   Passive ROM Left shoulder in all directions limited by pain for flex/abd,/IR .  Resisted  alternating isometrics IR/ER 3 positions x 20 sec ea, Rhythmic stabs 90 deg flexion 30 sec                 PT Education - 09/19/20 1451    Education Details Pt educated in shoulder, scapular strengthening for HEP 1 x 10 reps to be performed in ROM without pain.  Supine scap protraction, SL ER, yellow TB Scap retraction, shoulder ext, IR, and ER all x 10.  Pt given illustrated and written instructions .  Pt educated about Ionto patch and how it works, and was advised to remove if having any discomfort, itching etc.  Advised to remove with soap and water in 4 hours.   Person(s) Educated Patient    Methods Explanation;Demonstration;Handout    Comprehension Verbalized understanding;Returned demonstration               PT Long Term Goals - 09/14/20 1109      PT LONG TERM GOAL #1   Title Patient will demonstrate she has regained full shoulder ROM and function post operatively compared to baseline measurements.    Time 4    Period Weeks    Status On-going    Target Date 10/12/20      PT LONG TERM GOAL #2   Title Patient will report >/= 25% reduction in left shoulder pain while performing daily tasks.    Time 4    Period Weeks    Status New    Target Date 10/12/20      PT LONG TERM GOAL #3   Title Patient will report >/= 25% improvement in her ability to sleep at night    Time 4    Period Weeks    Status New    Target Date 10/12/20      PT LONG TERM GOAL #4   Title Patient will increase left shoulder active flexion and abduction to >/= 145 degrees for increased each reaching overhead.    Time 4    Period Weeks    Status New    Target Date 10/12/20                 Plan - 09/19/20 1731    Clinical Impression Statement Treatment consisted of PROM, stab exs, HEP with theraband and AROM, and she will continue post op exs.  Also performed IONTO #1.A/ PROM was limited by pain but not tightness.. continued tenderness in left bicipital groove.  good tolerance and  understanding of exercises.    Stability/Clinical  Decision Making Stable/Uncomplicated    Rehab Potential Excellent    PT Frequency 2x / week    PT Duration 4 weeks    PT Treatment/Interventions ADLs/Self Care Home Management;Therapeutic exercise;Patient/family education;Iontophoresis 64m/ml Dexamethasone;Manual techniques;Joint Manipulations;Passive range of motion    PT Next Visit Plan Review exercises instructed today, cont AROM/PROM, IONTo, add Cross friction massage    PT Home Exercise Plan Post op shoulder ROM HEP, theraband exs, AROM serratus punch and SL ER    Consulted and Agree with Plan of Care Patient           Patient will benefit from skilled therapeutic intervention in order to improve the following deficits and impairments:  Postural dysfunction, Decreased knowledge of precautions, Impaired UE functional use, Pain, Decreased range of motion  Visit Diagnosis: Malignant neoplasm of lower-outer quadrant of left breast of female, estrogen receptor positive ([facility])  Abnormal posture  Stiffness of left shoulder, not elsewhere classified  Chronic left shoulder pain     Problem List Patient Active Problem List   Diagnosis Date Noted  . Genetic testing 08/01/2020  . Family history of breast cancer 07/26/2020  . Family history of prostate cancer 07/26/2020  . Family history of colon cancer 07/26/2020  . Malignant neoplasm of lower-outer quadrant of left breast of female, estrogen receptor positive ([place] 07/20/2020    RClaris Pong PT 09/19/2020, 5:45 PM  [place] [address]Phone: [phone]  Fax:  [phone] Name: RWREATHA STURGEONMRN: [id number] of Birth: 720-Apr-1967

## 2020-09-19 NOTE — Progress Notes (Signed)
Patient Care Team: [name], [place] as PCP - General (Internal Medicine) [name], RN as Oncology Nurse Navigator [name], RN as Oncology Nurse Navigator [name], MD as Consulting Physician (General Surgery) [name], MD as Consulting Physician (Hematology and Oncology) Kyung Rudd, MD as Consulting Physician (Radiation Oncology)  DIAGNOSIS:    ICD-10-CM   1. Malignant neoplasm of lower-outer quadrant of left breast of female, estrogen receptor positive ([place])  C50.512    Z17.0     SUMMARY OF ONCOLOGIC HISTORY: Oncology History  Malignant neoplasm of lower-outer quadrant of left breast of female, estrogen receptor positive ([place])  07/20/2020 Initial Diagnosis   Screening mammogram detected left breast calcifications, enlarged left axillary lymph nodes, and an asymmetry in the right breast. Diagnostic mammogram showed in the left breast, a 2.4cm mass with calcifications, 3:30 position, a left axillary lymph node with cortical thickness, and in the right breast, no suspicious masses or calcifications. Left breast biopsy showed invasive and in situ mammary carcinoma in the breast and axilla, grade 2, HER-2 equivocal by IHC (2+), negative by FISH, ER+ 95%, PR+40%, Ki67 15%.    07/26/2020 Cancer Staging   Staging form: Breast, AJCC 8th Edition - Clinical stage from 07/26/2020: Stage IIA (cT2, cN1(f), cM0, G2, ER+, PR+, HER2-) - Signed by [name], MD on 07/26/2020   08/01/2020 Genetic Testing   No pathogenic variants detected in [facility] Multi-Cancer Panel.  The Multi-Cancer Panel offered by [facility] includes sequencing and/or deletion duplication testing of the following 85 genes: AIP, ALK, APC, ATM, AXIN2,BAP1,  BARD1, BLM, BMPR1A, BRCA1, BRCA2, BRIP1, CASR, CDC73, CDH1, CDK4, CDKN1B, CDKN1C, CDKN2A (p14ARF), CDKN2A (p16INK4a), CEBPA, CHEK2, CTNNA1, DICER1, DIS3L2, EGFR (c.2369C>T, p.Thr790Met variant only), EPCAM (Deletion/duplication testing only), FH, FLCN,  GATA2, GPC3, GREM1 (Promoter region deletion/duplication testing only), HOXB13 (c.251G>A, p.Gly84Glu), HRAS, KIT, MAX, MEN1, MET, MITF (c.952G>A, p.Glu318Lys variant only), MLH1, MSH2, MSH3, MSH6, MUTYH, NBN, NF1, NF2, NTHL1, PALB2, PDGFRA, PHOX2B, PMS2, POLD1, POLE, POT1, PRKAR1A, PTCH1, PTEN, RAD50, RAD51C, RAD51D, RB1, RECQL4, RET, RNF43, RUNX1, SDHAF2, SDHA (sequence changes only), SDHB, SDHC, SDHD, SMAD4, SMARCA4, SMARCB1, SMARCE1, STK11, SUFU, TERC, TERT, TMEM127, TP53, TSC1, TSC2, VHL, WRN and WT1.  The report date is August 01, 2020.    08/16/2020 Surgery   Left lumpectomy [name]): IDC, grade 2, 2.3cm, with intermediate grade DCIS, clear margins, 1/2 left axillary lymph nodes positive for carcinoma.HER-2 equivocal by IHC (2+), negative by FISH, ER+ 95%, PR+40%, Ki67 15%.    08/24/2020 Cancer Staging   Staging form: Breast, AJCC 8th Edition - Pathologic stage from 08/24/2020: Stage IB (pT2, pN1a, cM0, G2, ER+, PR+, HER2-) - Signed by [name], MD on 08/24/2020   08/30/2020 Miscellaneous   MammaPrint high risk   09/13/2020 -  Chemotherapy   The patient had dexamethasone (DECADRON) 4 MG tablet, 4 mg (100 % of original dose 4 mg), Oral,  Once, 1 of 1 cycle, Start date: 09/04/2020, End date: 09/04/2020 Dose modification: 4 mg (original dose 4 mg, Cycle 0) DOXOrubicin (ADRIAMYCIN) chemo injection 130 mg, 60 mg/m2 = 130 mg, Intravenous,  Once, 1 of 4 cycles Administration: 130 mg (09/13/2020) ondansetron (ZOFRAN) 16 mg in sodium chloride 0.9 % 50 mL IVPB, 16 mg (100 % of original dose 16 mg), Intravenous,  Once, 1 of 4 cycles Dose modification: 16 mg (original dose 16 mg, Cycle 1) Administration: 16 mg (09/13/2020) cyclophosphamide (CYTOXAN) 1,300 mg in sodium chloride 0.9 % 250 mL chemo infusion, 600 mg/m2 = 1,300 mg, Intravenous,  Once, 1 of 4 cycles Administration: 1,300 mg (09/13/2020) PACLitaxel (TAXOL) 174 mg in sodium chloride 0.9 % 250 mL chemo infusion (</= 84m/m2), 80 mg/m2 = 174  mg, Intravenous,  Once, 0 of 12 cycles fosaprepitant (EMEND) 150 mg in sodium chloride 0.9 % 145 mL IVPB, 150 mg, Intravenous,  Once, 1 of 4 cycles Administration: 150 mg (09/13/2020)  for chemotherapy treatment.      CHIEF COMPLIANT: Cycle 1 Day 8 Adriamycin and Cytoxan   INTERVAL HISTORY: Lindsay KLINKNERis a 54y.o. with above-mentioned history of left breast cancer treated with lumpectomy and who is currently on adjuvant chemotherapy with dose dense Adriamycin and Cytoxan. She presents to the clinic today for a toxicity check following cycle 1.    ALLERGIES:  has No Known Allergies.  MEDICATIONS:  Current Outpatient Medications  Medication Sig Dispense Refill  . ALPRAZolam (XANAX) 0.25 MG tablet Take 1 tablet (0.25 mg total) by mouth 2 (two) times daily as needed for anxiety. 60 tablet 3  . dexamethasone (DECADRON) 4 MG tablet Take 4 mg by mouth daily.    .Marland Kitchenlidocaine-prilocaine (EMLA) cream Apply to affected area once (Patient taking differently: Apply 1 application topically as needed. Apply to affected area once) 30 g 3  . metroNIDAZOLE (METROCREAM) 0.75 % cream Apply 1 application topically once a week. Apply to face daily    . ondansetron (ZOFRAN) 8 MG tablet Take 1 tablet (8 mg total) by mouth 2 (two) times daily as needed. Start on the third day after chemotherapy. 30 tablet 1  . prochlorperazine (COMPAZINE) 10 MG tablet Take 1 tablet (10 mg total) by mouth every 6 (six) hours as needed (Nausea or vomiting). 30 tablet 1  . traMADol (ULTRAM) 50 MG tablet Take 1 tablet (50 mg total) by mouth every 6 (six) hours as needed for moderate pain or severe pain. 12 tablet 0   No current facility-administered medications for this visit.    PHYSICAL EXAMINATION: ECOG PERFORMANCE STATUS: 1 - Symptomatic but completely ambulatory  Vitals:   09/20/20 0822  BP: 118/84  Pulse: 91  Resp: 17  Temp: (!) 97.5 F (36.4 C)  SpO2: 99%   Filed Weights   09/20/20 0822  Weight: 212 lb 1.6 oz  (96.2 kg)    LABORATORY DATA:  I have reviewed the data as listed CMP Latest Ref Rng & Units 09/13/2020 08/14/2020 07/26/2020  Glucose 70 - 99 mg/dL 90 117(H) 129(H)  BUN 6 - 20 mg/dL _0 Creatinine 0.44 - 1.00 mg/dL 0.78 1.04(H) 0.89  Sodium 135 - 145 mmol/L 138 138 137  Potassium 3.5 - 5.1 mmol/L 3.8 4.0 3.8  Chloride 98 - 111 mmol/L 109 103 101  CO2 22 - 32 mmol/L _1 Calcium 8.9 - 10.3 mg/dL 9.3 12.6(H) 11.0(H)  Total Protein 6.5 - 8.1 g/dL 6.7 - 7.9  Total Bilirubin 0.3 - 1.2 mg/dL 0.5 - 0.9  Alkaline Phos 38 - 126 U/L 37(L) - 55  AST 15 - 41 U/L 23 - 31  ALT 0 - 44 U/L 50(H) - 68(H)    Lab Results  Component Value Date   WBC 1.5 (L) 09/20/2020   HGB 11.7 (L) 09/20/2020   HCT 34.5 (L) 09/20/2020   MCV 92.5 09/20/2020   PLT 216 09/20/2020   NEUTROABS PENDING 09/20/2020    ASSESSMENT & PLAN:  Malignant neoplasm of lower-outer quadrant of left breast of female, estrogen receptor positive ([place] 08/16/2020:Left lumpectomy ([name]: IDC, grade 2, 2.3cm,  with intermediate grade DCIS, clear margins, 1/2 left axillary lymph nodes positive for carcinoma.HER-2 equivocal by IHC (2+), negative by FISH, ER+ 95%, PR+40%, Ki67 15%. T2 N1 M0 stage Ib MammaPrint: High risk (average 10-year risk untreated: 29%, predicted benefit of chemo with hormone therapy at 5 years: 94.6%  Treatment plan:  1. Adjuvant chemotherapy withdose dense Adriamycin and Cytoxan x4 followed by Taxol x12 2. followed by adjuvant radiation 3.  Follow-up adjuvant antiestrogen therapy ------------------------------------------------------------------------------------------------------------------------------------ Current treatment: Cycle day 1 day 8 dose consider medicine Cytoxan Echocardiogram 09/08/2020: EF 60 to 65%  Chemo toxicities: Denies any adverse effects.  Did not have any nausea or vomiting. WBC count low as expected. She tolerated treatment extremely well. Mild fatigue.  Return to  clinic in 1 week for cycle 2    No orders of the defined types were placed in this encounter.  The patient has a good understanding of the overall plan. she agrees with it. she will call with any problems that may develop before the next visit here.  Total time spent: 30 mins including face to face time and time spent for planning, charting and coordination of care  [name], MD 09/20/2020  I, Cloyde Reams Dorshimer, am acting as scribe for Dr. [name].  I have reviewed the above documentation for accuracy and completeness, and I agree with the above.

## 2020-09-19 NOTE — Assessment & Plan Note (Signed)
08/16/2020:Left lumpectomy Lindsay [place]): IDC, grade 2, 2.3cm, with intermediate grade DCIS, clear margins, 1/2 left axillary lymph nodes positive for carcinoma.HER-2 equivocal by IHC (2+), negative by FISH, ER+ 95%, PR+40%, Ki67 15%. T2 N1 M0 stage Ib MammaPrint: High risk (average 10-year risk untreated: 29%, predicted benefit of chemo with hormone therapy at 5 years: 94.6%  Treatment plan:  1. Adjuvant chemotherapy withdose dense Adriamycin and Cytoxan x4 followed by Taxol x12 2. followed by adjuvant radiation 3.  Follow-up adjuvant antiestrogen therapy ------------------------------------------------------------------------------------------------------------------------------------ Current treatment: Cycle day 1 day 8 dose consider medicine Cytoxan Echocardiogram 09/08/2020: EF 60 to 65%  Chemo toxicities  Return to clinic in 1 week for cycle 2

## 2020-09-20 ENCOUNTER — Other Ambulatory Visit: Payer: 59

## 2020-09-20 ENCOUNTER — Encounter: Payer: Self-pay | Admitting: *Deleted

## 2020-09-20 ENCOUNTER — Ambulatory Visit: Payer: 59 | Admitting: Hematology and Oncology

## 2020-09-20 ENCOUNTER — Inpatient Hospital Stay (HOSPITAL_BASED_OUTPATIENT_CLINIC_OR_DEPARTMENT_OTHER): Payer: 59 | Admitting: Hematology and Oncology

## 2020-09-20 ENCOUNTER — Inpatient Hospital Stay: Payer: 59

## 2020-09-20 ENCOUNTER — Other Ambulatory Visit: Payer: Self-pay

## 2020-09-20 DIAGNOSIS — C50512 Malignant neoplasm of lower-outer quadrant of left female breast: Secondary | ICD-10-CM | POA: Diagnosis not present

## 2020-09-20 DIAGNOSIS — Z170 Estrogen receptor positive status [ER+]: Secondary | ICD-10-CM | POA: Diagnosis not present

## 2020-09-20 DIAGNOSIS — Z95828 Presence of other vascular implants and grafts: Secondary | ICD-10-CM

## 2020-09-20 LAB — CMP (CANCER CENTER ONLY)
ALT: 37 U/L (ref 0–44)
AST: 21 U/L (ref 15–41)
Albumin: 3.8 g/dL (ref 3.5–5.0)
Alkaline Phosphatase: 49 U/L (ref 38–126)
Anion gap: 7 (ref 5–15)
BUN: 16 mg/dL (ref 6–20)
CO2: 25 mmol/L (ref 22–32)
Calcium: 10.1 mg/dL (ref 8.9–10.3)
Chloride: 105 mmol/L (ref 98–111)
Creatinine: 0.86 mg/dL (ref 0.44–1.00)
GFR, Estimated: >60 mL/min (ref >60)
Glucose, Bld: 123 mg/dL — ABNORMAL HIGH (ref 70–99)
Potassium: 4.1 mmol/L (ref 3.5–5.1)
Sodium: 137 mmol/L (ref 135–145)
Total Bilirubin: 0.7 mg/dL (ref 0.3–1.2)
Total Protein: 6.7 g/dL (ref 6.5–8.1)

## 2020-09-20 LAB — CBC WITH DIFFERENTIAL (CANCER CENTER ONLY)
Abs Immature Granulocytes: 0.01 10*3/uL (ref 0.00–0.07)
Basophils Absolute: 0 10*3/uL (ref 0.0–0.1)
Basophils Relative: 3 %
Eosinophils Absolute: 0.1 10*3/uL (ref 0.0–0.5)
Eosinophils Relative: 4 %
HCT: 34.5 % — ABNORMAL LOW (ref 36.0–46.0)
Hemoglobin: 11.7 g/dL — ABNORMAL LOW (ref 12.0–15.0)
Immature Granulocytes: 1 %
Lymphocytes Relative: 52 %
Lymphs Abs: 0.8 10*3/uL (ref 0.7–4.0)
MCH: 31.4 pg (ref 26.0–34.0)
MCHC: 33.9 g/dL (ref 30.0–36.0)
MCV: 92.5 fL (ref 80.0–100.0)
Monocytes Absolute: 0 10*3/uL — ABNORMAL LOW (ref 0.1–1.0)
Monocytes Relative: 3 %
Neutro Abs: 0.6 10*3/uL — ABNORMAL LOW (ref 1.7–7.7)
Neutrophils Relative %: 37 %
Platelet Count: 216 10*3/uL (ref 150–400)
RBC: 3.73 MIL/uL — ABNORMAL LOW (ref 3.87–5.11)
RDW: 12.3 % (ref 11.5–15.5)
WBC Count: 1.5 10*3/uL — ABNORMAL LOW (ref 4.0–10.5)
nRBC: 0 % (ref 0.0–0.2)

## 2020-09-20 MED ORDER — HEPARIN SOD (PORK) LOCK FLUSH 100 UNIT/ML IV SOLN
500.0000 [IU] | Freq: Once | INTRAVENOUS | Status: AC
  Administered 2020-09-20: 500 [IU] via INTRAVENOUS
  Filled 2020-09-20: qty 5

## 2020-09-20 MED ORDER — SODIUM CHLORIDE 0.9% FLUSH
10.0000 mL | Freq: Once | INTRAVENOUS | Status: AC
  Administered 2020-09-20: 10 mL via INTRAVENOUS
  Filled 2020-09-20: qty 10

## 2020-09-21 ENCOUNTER — Inpatient Hospital Stay: Payer: 59

## 2020-09-21 ENCOUNTER — Inpatient Hospital Stay: Payer: 59 | Admitting: Medical

## 2020-09-25 ENCOUNTER — Telehealth: Payer: Self-pay | Admitting: Hematology and Oncology

## 2020-09-25 ENCOUNTER — Other Ambulatory Visit: Payer: Self-pay

## 2020-09-25 ENCOUNTER — Ambulatory Visit: Payer: 59 | Attending: Surgery

## 2020-09-25 DIAGNOSIS — G8929 Other chronic pain: Secondary | ICD-10-CM

## 2020-09-25 DIAGNOSIS — C50512 Malignant neoplasm of lower-outer quadrant of left female breast: Secondary | ICD-10-CM | POA: Diagnosis not present

## 2020-09-25 DIAGNOSIS — M25512 Pain in left shoulder: Secondary | ICD-10-CM | POA: Diagnosis present

## 2020-09-25 DIAGNOSIS — R293 Abnormal posture: Secondary | ICD-10-CM

## 2020-09-25 DIAGNOSIS — Z170 Estrogen receptor positive status [ER+]: Secondary | ICD-10-CM | POA: Diagnosis present

## 2020-09-25 DIAGNOSIS — M25612 Stiffness of left shoulder, not elsewhere classified: Secondary | ICD-10-CM | POA: Diagnosis present

## 2020-09-25 NOTE — Therapy (Signed)
[facility] [place], [place], [address] Phone: [phone]   Fax:  [phone]  Physical Therapy Treatment  Patient Details  Name: Lindsay [place] MRN: [id number] Date of Birth: 06/27/1966 Referring Provider (PT): Dr. [name]   Encounter Date: 09/25/2020   PT End of Session - 09/25/20 1204    Visit Number 4    Number of Visits 10    Date for PT Re-Evaluation 10/12/20    PT Start Time 1106    PT Stop Time 1153    PT Time Calculation (min) 47 min    Activity Tolerance Patient tolerated treatment well    Behavior During Therapy [facility] for tasks assessed/performed           Past Medical History:  Diagnosis Date  . Allergy    seasonal  . Anemia    After childbirth  . Anxiety   . Breast cancer ([place])   . Family history of breast cancer 07/26/2020  . Family history of colon cancer 07/26/2020  . Family history of prostate cancer 07/26/2020  . GERD (gastroesophageal reflux disease)   . Pneumonia    In 20s    Past Surgical History:  Procedure Laterality Date  . AXILLARY LYMPH NODE DISSECTION Left 08/16/2020   Procedure: LEFT TARGETED AXILLARY LYMPH NODE DISSECTION;  Surgeon: [name], MD;  Location: [place];  Service: General;  Laterality: Left;  . BREAST LUMPECTOMY WITH RADIOACTIVE SEED AND AXILLARY LYMPH NODE DISSECTION Left 08/16/2020   Procedure: LEFT BREAST LUMPECTOMY WITH RADIOACTIVE SEED;  Surgeon: [name], MD;  Location: [place];  Service: General;  Laterality: Left;  PEC BLOCK  . BREAST SURGERY Left 06/2020   breast bx   . CERVICAL BIOPSY  W/ LOOP ELECTRODE EXCISION  12/2017  . CESAREAN SECTION    . DILATION AND CURETTAGE OF UTERUS    . PORTACATH PLACEMENT  09/12/2020   Procedure: INSERTION PORT-A-CATH WITH ULTRASOUND;  Surgeon: [name], MD;  Location: [facility];  Service: General;;  . WISDOM TOOTH EXTRACTION  2020    There were no vitals filed for this visit.   Subjective Assessment -  09/25/20 1102    Subjective Did well after last weeks visit.  Left the patch on for 4 hours and had no skin irritation.  Have been doing the exercises without any problems.  Feeling a [place] better overall but I did lift some plants to get them inside and felt it in my shoulder. Feeling less popping in shoulder and can sleep on left side a Camilli better in last couple of weeks/    Pertinent History Patient was diagnosed on 07/06/2020 with left grade II invasive ductal carcinoma breast cancer. Patient reports she underwent a left lumpectomy and sentinel node biopsy (1/2 nodes positive) on 08/16/2020. It is ER/PR positive and HER2 negative with a Ki67 of 15%. She has a positive axillary lymph node.    Currently in Pain? Yes    Pain Score 4     Pain Location Shoulder    Pain Descriptors / Indicators Aching;Dull    Pain Type Chronic pain    Pain Onset More than a month ago    Pain Frequency Intermittent    Aggravating Factors  raising arm, sleeping on left side, reaching behind.                             [place] Adult PT Treatment/Exercise - 09/25/20 0001  Neuro Re-ed    Neuro Re-ed Details  alternating isometrics IR/ER  3 positions x 20 sec, Rhythmic stabs 90 deg flexion x  20 sec x 2      Exercises   Exercises Shoulder      Shoulder Exercises: Supine   Protraction AROM;Strengthening;Left;20 reps    Other Supine Exercises supine ABC's 1#      Shoulder Exercises: Sidelying   Other Sidelying Exercises ER 2x10 left    AROM 0#     Shoulder Exercises: Standing   Horizontal ABduction Strengthening;Both;10 reps    Theraband Level (Shoulder Horizontal ABduction) Level 1 (Yellow)   supine   External Rotation Strengthening;10 reps   SL AROM   Theraband Level (Shoulder External Rotation) Level 1 (Yellow)    External Rotation Weight (lbs) 10 reps    Internal Rotation Strengthening;Left;10 reps    Theraband Level (Shoulder Internal Rotation) Level 1 (Yellow)    Extension  Strengthening;10 reps;Left    Theraband Level (Shoulder Extension) Level 1 (Yellow)    Row 10 reps    Theraband Level (Shoulder Row) Level 1 (Yellow)      Shoulder Exercises: Pulleys   Flexion 2 minutes    Scaption 2 minutes      Shoulder Exercises: Therapy Ball   Flexion 10 reps;Both   yellow ball     Iontophoresis   Type of Iontophoresis Dexamethasone    Location Left proximal biceps    Dose 51m/ml    Time 4 hr wear time      Manual Therapy   Soft tissue mobilization transverse friction massage to prox biceps tendon.  Pt. instructed in same    Passive ROM left shoulder flex, scaption, IR in ROM with minimal discomfort                  PT Education - 09/25/20 1202    Education Details Pt was educated in supine horizontal abd with yellow theraband x 10, and supine alphabet to perform for HEP, also instructed in TFM to proximal biceps.    Person(s) Educated Patient    Methods Explanation;Demonstration;Handout    Comprehension Verbalized understanding;Returned demonstration               PT Long Term Goals - 09/14/20 1109      PT LONG TERM GOAL #1   Title Patient will demonstrate she has regained full shoulder ROM and function post operatively compared to baseline measurements.    Time 4    Period Weeks    Status On-going    Target Date 10/12/20      PT LONG TERM GOAL #2   Title Patient will report >/= 25% reduction in left shoulder pain while performing daily tasks.    Time 4    Period Weeks    Status New    Target Date 10/12/20      PT LONG TERM GOAL #3   Title Patient will report >/= 25% improvement in her ability to sleep at night    Time 4    Period Weeks    Status New    Target Date 10/12/20      PT LONG TERM GOAL #4   Title Patient will increase left shoulder active flexion and abduction to >/= 145 degrees for increased each reaching overhead.    Time 4    Period Weeks    Status New    Target Date 10/12/20  Plan -  09/25/20 1204    Clinical Impression Statement Treatment has consisted of left shoulder strengthening, stabilization, PROM , Home exercise program and Iontophoresis.  Pt is compliant with HEP and is feeling a [place] better.  She notices less popping in her shoulder.  She did well with the pulleys today for self ROM    Stability/Clinical Decision Making Stable/Uncomplicated    PT Frequency 2x / week    PT Duration 4 weeks    PT Treatment/Interventions ADLs/Self Care Home Management;Therapeutic exercise;Patient/family education;Iontophoresis 65m/ml Dexamethasone;Manual techniques;Joint Manipulations;Passive range of motion    PT Next Visit Plan Continue therex/ PROM, stab exs, TFM, Ionto, check IR behind back    Consulted and Agree with Plan of Care Patient           Patient will benefit from skilled therapeutic intervention in order to improve the following deficits and impairments:     Visit Diagnosis: Malignant neoplasm of lower-outer quadrant of left breast of female, estrogen receptor positive ([facility])  Abnormal posture  Stiffness of left shoulder, not elsewhere classified  Chronic left shoulder pain     Problem List Patient Active Problem List   Diagnosis Date Noted  . Genetic testing 08/01/2020  . Family history of breast cancer 07/26/2020  . Family history of prostate cancer 07/26/2020  . Family history of colon cancer 07/26/2020  . Malignant neoplasm of lower-outer quadrant of left breast of female, estrogen receptor positive ([facility] 07/20/2020    RElsie RaWSpringhill Surgery Center LLC11/11/2019, 12:15 PM  [place] [address]Phone: [phone]  Fax:  [phone] Name: RMERCIE BALSLEYMRN: [id number] of Birth: 701-20-1967

## 2020-09-25 NOTE — Telephone Encounter (Signed)
No 10/27 los, no changes made to pt schedule

## 2020-09-25 NOTE — Patient Instructions (Signed)
Access Code: [email or web address] URL: [email or web address] Date: 09/25/2020 Prepared by: Cheral Almas  Exercises Supine Shoulder Horizontal Abduction with Resistance - 1 x daily - 7 x weekly - 3 sets - 10 reps Supine Shoulder Alphabet - 1 x daily - 7 x weekly - 3 sets - 10 reps

## 2020-09-26 NOTE — Progress Notes (Signed)
Patient Care Team: [name], [place] as PCP - General (Internal Medicine) [name], RN as Oncology Nurse Navigator [name], RN as Oncology Nurse Navigator [name], MD as Consulting Physician (General Surgery) [name], MD as Consulting Physician (Hematology and Oncology) Kyung Rudd, MD as Consulting Physician (Radiation Oncology)  DIAGNOSIS:    ICD-10-CM   1. Malignant neoplasm of lower-outer quadrant of left breast of female, estrogen receptor positive ([place])  C50.512    Z17.0     SUMMARY OF ONCOLOGIC HISTORY: Oncology History  Malignant neoplasm of lower-outer quadrant of left breast of female, estrogen receptor positive ([place])  07/20/2020 Initial Diagnosis   Screening mammogram detected left breast calcifications, enlarged left axillary lymph nodes, and an asymmetry in the right breast. Diagnostic mammogram showed in the left breast, a 2.4cm mass with calcifications, 3:30 position, a left axillary lymph node with cortical thickness, and in the right breast, no suspicious masses or calcifications. Left breast biopsy showed invasive and in situ mammary carcinoma in the breast and axilla, grade 2, HER-2 equivocal by IHC (2+), negative by FISH, ER+ 95%, PR+40%, Ki67 15%.    07/26/2020 Cancer Staging   Staging form: Breast, AJCC 8th Edition - Clinical stage from 07/26/2020: Stage IIA (cT2, cN1(f), cM0, G2, ER+, PR+, HER2-) - Signed by [name], MD on 07/26/2020   08/01/2020 Genetic Testing   No pathogenic variants detected in [facility] Multi-Cancer Panel.  The Multi-Cancer Panel offered by [facility] includes sequencing and/or deletion duplication testing of the following 85 genes: AIP, ALK, APC, ATM, AXIN2,BAP1,  BARD1, BLM, BMPR1A, BRCA1, BRCA2, BRIP1, CASR, CDC73, CDH1, CDK4, CDKN1B, CDKN1C, CDKN2A (p14ARF), CDKN2A (p16INK4a), CEBPA, CHEK2, CTNNA1, DICER1, DIS3L2, EGFR (c.2369C>T, p.Thr790Met variant only), EPCAM (Deletion/duplication testing only), FH, FLCN,  GATA2, GPC3, GREM1 (Promoter region deletion/duplication testing only), HOXB13 (c.251G>A, p.Gly84Glu), HRAS, KIT, MAX, MEN1, MET, MITF (c.952G>A, p.Glu318Lys variant only), MLH1, MSH2, MSH3, MSH6, MUTYH, NBN, NF1, NF2, NTHL1, PALB2, PDGFRA, PHOX2B, PMS2, POLD1, POLE, POT1, PRKAR1A, PTCH1, PTEN, RAD50, RAD51C, RAD51D, RB1, RECQL4, RET, RNF43, RUNX1, SDHAF2, SDHA (sequence changes only), SDHB, SDHC, SDHD, SMAD4, SMARCA4, SMARCB1, SMARCE1, STK11, SUFU, TERC, TERT, TMEM127, TP53, TSC1, TSC2, VHL, WRN and WT1.  The report date is August 01, 2020.    08/16/2020 Surgery   Left lumpectomy [name]): IDC, grade 2, 2.3cm, with intermediate grade DCIS, clear margins, 1/2 left axillary lymph nodes positive for carcinoma.HER-2 equivocal by IHC (2+), negative by FISH, ER+ 95%, PR+40%, Ki67 15%.    08/24/2020 Cancer Staging   Staging form: Breast, AJCC 8th Edition - Pathologic stage from 08/24/2020: Stage IB (pT2, pN1a, cM0, G2, ER+, PR+, HER2-) - Signed by [name], MD on 08/24/2020   08/30/2020 Miscellaneous   MammaPrint high risk   09/13/2020 -  Chemotherapy   The patient had dexamethasone (DECADRON) 4 MG tablet, 4 mg (100 % of original dose 4 mg), Oral,  Once, 1 of 1 cycle, Start date: 09/04/2020, End date: 09/04/2020 Dose modification: 4 mg (original dose 4 mg, Cycle 0) DOXOrubicin (ADRIAMYCIN) chemo injection 130 mg, 60 mg/m2 = 130 mg, Intravenous,  Once, 1 of 4 cycles Administration: 130 mg (09/13/2020) ondansetron (ZOFRAN) 16 mg in sodium chloride 0.9 % 50 mL IVPB, 16 mg (100 % of original dose 16 mg), Intravenous,  Once, 1 of 4 cycles Dose modification: 16 mg (original dose 16 mg, Cycle 1) Administration: 16 mg (09/13/2020) cyclophosphamide (CYTOXAN) 1,300 mg in sodium chloride 0.9 % 250 mL chemo infusion, 600 mg/m2 = 1,300 mg, Intravenous,  Once, 1 of 4 cycles Administration: 1,300 mg (09/13/2020) PACLitaxel (TAXOL) 174 mg in sodium chloride 0.9 % 250 mL chemo infusion (</= $RemoveBefor'80mg'gXiSjjzrEeiO$ /m2), 80 mg/m2 = 174  mg, Intravenous,  Once, 0 of 12 cycles fosaprepitant (EMEND) 150 mg in sodium chloride 0.9 % 145 mL IVPB, 150 mg, Intravenous,  Once, 1 of 4 cycles Administration: 150 mg (09/13/2020)  for chemotherapy treatment.      CHIEF COMPLIANT: Cycle 2Adriamycin and Cytoxan   INTERVAL HISTORY: Lindsay [place] is a 54 y.o. with above-mentioned history of left breast cancer treated with lumpectomyand who is currently on adjuvant chemotherapy withdose dense Adriamycin and Cytoxan. She presents to the clinic todayfor a toxicity check and cycle 2. overall she has done extremely well from chemo standpoint.  She did have mild fatigue which has resolved.  Taste and appetite are excellent.  She took MiraLAX for constipation.   ALLERGIES:  has No Known Allergies.  MEDICATIONS:  Current Outpatient Medications  Medication Sig Dispense Refill  . ALPRAZolam (XANAX) 0.25 MG tablet Take 1 tablet (0.25 mg total) by mouth 2 (two) times daily as needed for anxiety. 60 tablet 3  . dexamethasone (DECADRON) 4 MG tablet Take 4 mg by mouth daily.    Marland Kitchen lidocaine-prilocaine (EMLA) cream Apply to affected area once (Patient taking differently: Apply 1 application topically as needed. Apply to affected area once) 30 g 3  . metroNIDAZOLE (METROCREAM) 0.75 % cream Apply 1 application topically once a week. Apply to face daily    . ondansetron (ZOFRAN) 8 MG tablet Take 1 tablet (8 mg total) by mouth 2 (two) times daily as needed. Start on the third day after chemotherapy. 30 tablet 1  . prochlorperazine (COMPAZINE) 10 MG tablet Take 1 tablet (10 mg total) by mouth every 6 (six) hours as needed (Nausea or vomiting). 30 tablet 1  . traMADol (ULTRAM) 50 MG tablet Take 1 tablet (50 mg total) by mouth every 6 (six) hours as needed for moderate pain or severe pain. 12 tablet 0   No current facility-administered medications for this visit.    PHYSICAL EXAMINATION: ECOG PERFORMANCE STATUS: 1 - Symptomatic but completely  ambulatory  Vitals:   09/27/20 0949  BP: (!) 137/92  Pulse: 95  Resp: 20  Temp: 98.6 F (37 C)  SpO2: 98%   Filed Weights   09/27/20 0949  Weight: 216 lb 4.8 oz (98.1 kg)    LABORATORY DATA:  I have reviewed the data as listed CMP Latest Ref Rng & Units 09/20/2020 09/13/2020 08/14/2020  Glucose 70 - 99 mg/dL 123(H) 90 117(H)  BUN 6 - 20 mg/dL $Remove'16 11 17  'IbgKdfS$ Creatinine 0.44 - 1.00 mg/dL 0.86 0.78 1.04(H)  Sodium 135 - 145 mmol/L 137 138 138  Potassium 3.5 - 5.1 mmol/L 4.1 3.8 4.0  Chloride 98 - 111 mmol/L 105 109 103  CO2 22 - 32 mmol/L $RemoveB'25 22 25  'tuwXRTEr$ Calcium 8.9 - 10.3 mg/dL 10.1 9.3 12.6(H)  Total Protein 6.5 - 8.1 g/dL 6.7 6.7 -  Total Bilirubin 0.3 - 1.2 mg/dL 0.7 0.5 -  Alkaline Phos 38 - 126 U/L 49 37(L) -  AST 15 - 41 U/L 21 23 -  ALT 0 - 44 U/L 37 50(H) -    Lab Results  Component Value Date   WBC 10.1 09/27/2020   HGB 12.0 09/27/2020   HCT 35.7 (L) 09/27/2020   MCV 92.7 09/27/2020   PLT 260 09/27/2020   NEUTROABS PENDING 09/27/2020    ASSESSMENT & PLAN:  Malignant neoplasm of lower-outer quadrant of left breast of female, estrogen receptor positive ([place]) 08/16/2020:Left lumpectomy [name]): IDC, grade 2, 2.3cm, with intermediate grade DCIS, clear margins, 1/2 left axillary lymph nodes positive for carcinoma.HER-2 equivocal by IHC (2+), negative by FISH, ER+ 95%, PR+40%, Ki67 15%. T2 N1 M0 stage Ib MammaPrint: High risk (average 10-year risk untreated: 29%, predicted benefit of chemo with hormone therapy at 5 years: 94.6%  Treatment plan: 1.Adjuvant chemotherapy withdose dense Adriamycin and Cytoxan x4 followed by Taxol x12 2.followed by adjuvant radiation 3.Follow-up adjuvant antiestrogen therapy ------------------------------------------------------------------------------------------------------------------------------------ Current treatment: Cycle day 2 dose consider medicine Cytoxan Echocardiogram 09/08/2020: EF 60 to 65%  Chemo toxicities: Denies  any adverse effects.  Did not have any nausea or vomiting. WBC count low as expected. She tolerated treatment extremely well. Mild fatigue.  Return to clinic in 2 weeks for cycle 3    No orders of the defined types were placed in this encounter.  The patient has a good understanding of the overall plan. she agrees with it. she will call with any problems that may develop before the next visit here.  Total time spent: 30 mins including face to face time and time spent for planning, charting and coordination of care  [name], MD 09/27/2020  I, Cloyde Reams Dorshimer, am acting as scribe for Dr. [name].  I have reviewed the above documentation for accuracy and completeness, and I agree with the above.

## 2020-09-27 ENCOUNTER — Inpatient Hospital Stay: Payer: 59

## 2020-09-27 ENCOUNTER — Inpatient Hospital Stay (HOSPITAL_BASED_OUTPATIENT_CLINIC_OR_DEPARTMENT_OTHER): Payer: 59 | Admitting: Hematology and Oncology

## 2020-09-27 ENCOUNTER — Other Ambulatory Visit: Payer: Self-pay

## 2020-09-27 ENCOUNTER — Inpatient Hospital Stay: Payer: 59 | Attending: Hematology and Oncology

## 2020-09-27 DIAGNOSIS — C50512 Malignant neoplasm of lower-outer quadrant of left female breast: Secondary | ICD-10-CM | POA: Diagnosis not present

## 2020-09-27 DIAGNOSIS — Z7952 Long term (current) use of systemic steroids: Secondary | ICD-10-CM | POA: Insufficient documentation

## 2020-09-27 DIAGNOSIS — Z170 Estrogen receptor positive status [ER+]: Secondary | ICD-10-CM | POA: Diagnosis not present

## 2020-09-27 DIAGNOSIS — Z79899 Other long term (current) drug therapy: Secondary | ICD-10-CM | POA: Insufficient documentation

## 2020-09-27 DIAGNOSIS — Z5111 Encounter for antineoplastic chemotherapy: Secondary | ICD-10-CM | POA: Diagnosis not present

## 2020-09-27 LAB — CMP (CANCER CENTER ONLY)
ALT: 46 U/L — ABNORMAL HIGH (ref 0–44)
AST: 29 U/L (ref 15–41)
Albumin: 3.8 g/dL (ref 3.5–5.0)
Alkaline Phosphatase: 48 U/L (ref 38–126)
Anion gap: 9 (ref 5–15)
BUN: 12 mg/dL (ref 6–20)
CO2: 22 mmol/L (ref 22–32)
Calcium: 9.3 mg/dL (ref 8.9–10.3)
Chloride: 108 mmol/L (ref 98–111)
Creatinine: 0.75 mg/dL (ref 0.44–1.00)
GFR, Estimated: >60 mL/min (ref >60)
Glucose, Bld: 115 mg/dL — ABNORMAL HIGH (ref 70–99)
Potassium: 4.2 mmol/L (ref 3.5–5.1)
Sodium: 139 mmol/L (ref 135–145)
Total Bilirubin: 0.3 mg/dL (ref 0.3–1.2)
Total Protein: 7.1 g/dL (ref 6.5–8.1)

## 2020-09-27 LAB — CBC WITH DIFFERENTIAL (CANCER CENTER ONLY)
Abs Immature Granulocytes: 0.8 10*3/uL — ABNORMAL HIGH (ref 0.00–0.07)
Basophils Absolute: 0.1 10*3/uL (ref 0.0–0.1)
Basophils Relative: 1 %
Eosinophils Absolute: 0 10*3/uL (ref 0.0–0.5)
Eosinophils Relative: 0 %
HCT: 35.7 % — ABNORMAL LOW (ref 36.0–46.0)
Hemoglobin: 12 g/dL (ref 12.0–15.0)
Immature Granulocytes: 8 %
Lymphocytes Relative: 21 %
Lymphs Abs: 2.1 10*3/uL (ref 0.7–4.0)
MCH: 31.2 pg (ref 26.0–34.0)
MCHC: 33.6 g/dL (ref 30.0–36.0)
MCV: 92.7 fL (ref 80.0–100.0)
Monocytes Absolute: 1 10*3/uL (ref 0.1–1.0)
Monocytes Relative: 10 %
Neutro Abs: 6.1 10*3/uL (ref 1.7–7.7)
Neutrophils Relative %: 60 %
Platelet Count: 260 10*3/uL (ref 150–400)
RBC: 3.85 MIL/uL — ABNORMAL LOW (ref 3.87–5.11)
RDW: 13.3 % (ref 11.5–15.5)
WBC Count: 10.1 10*3/uL (ref 4.0–10.5)
nRBC: 0.5 % — ABNORMAL HIGH (ref 0.0–0.2)

## 2020-09-27 MED ORDER — SODIUM CHLORIDE 0.9 % IV SOLN
16.0000 mg | Freq: Once | INTRAVENOUS | Status: AC
  Administered 2020-09-27: 16 mg via INTRAVENOUS
  Filled 2020-09-27: qty 8

## 2020-09-27 MED ORDER — SODIUM CHLORIDE 0.9 % IV SOLN
150.0000 mg | Freq: Once | INTRAVENOUS | Status: AC
  Administered 2020-09-27: 150 mg via INTRAVENOUS
  Filled 2020-09-27: qty 150

## 2020-09-27 MED ORDER — SODIUM CHLORIDE 0.9 % IV SOLN
Freq: Once | INTRAVENOUS | Status: AC
  Filled 2020-09-27: qty 250

## 2020-09-27 MED ORDER — SODIUM CHLORIDE 0.9 % IV SOLN
10.0000 mg | Freq: Once | INTRAVENOUS | Status: AC
  Administered 2020-09-27: 10 mg via INTRAVENOUS
  Filled 2020-09-27: qty 10

## 2020-09-27 MED ORDER — SODIUM CHLORIDE 0.9 % IV SOLN
600.0000 mg/m2 | Freq: Once | INTRAVENOUS | Status: AC
  Administered 2020-09-27: 1300 mg via INTRAVENOUS
  Filled 2020-09-27: qty 65

## 2020-09-27 MED ORDER — DOXORUBICIN HCL CHEMO IV INJECTION 2 MG/ML
60.0000 mg/m2 | Freq: Once | INTRAVENOUS | Status: AC
  Administered 2020-09-27: 130 mg via INTRAVENOUS
  Filled 2020-09-27: qty 65

## 2020-09-27 MED ORDER — HEPARIN SOD (PORK) LOCK FLUSH 100 UNIT/ML IV SOLN
500.0000 [IU] | Freq: Once | INTRAVENOUS | Status: AC | PRN
  Administered 2020-09-27: 500 [IU]
  Filled 2020-09-27: qty 5

## 2020-09-27 MED ORDER — SODIUM CHLORIDE 0.9% FLUSH
10.0000 mL | INTRAVENOUS | Status: DC | PRN
  Administered 2020-09-27: 10 mL
  Filled 2020-09-27: qty 10

## 2020-09-27 NOTE — Assessment & Plan Note (Signed)
08/16/2020:Left lumpectomy Lindsay [place]): IDC, grade 2, 2.3cm, with intermediate grade DCIS, clear margins, 1/2 left axillary lymph nodes positive for carcinoma.HER-2 equivocal by IHC (2+), negative by FISH, ER+ 95%, PR+40%, Ki67 15%. T2 N1 M0 stage Ib MammaPrint: High risk (average 10-year risk untreated: 29%, predicted benefit of chemo with hormone therapy at 5 years: 94.6%  Treatment plan: 1.Adjuvant chemotherapy withdose dense Adriamycin and Cytoxan x4 followed by Taxol x12 2.followed by adjuvant radiation 3.Follow-up adjuvant antiestrogen therapy ------------------------------------------------------------------------------------------------------------------------------------ Current treatment: Cycle day 2 dose consider medicine Cytoxan Echocardiogram 09/08/2020: EF 60 to 65%  Chemo toxicities: Denies any adverse effects.  Did not have any nausea or vomiting. WBC count low as expected. She tolerated treatment extremely well. Mild fatigue.  Return to clinic in 2 weeks for cycle 3

## 2020-09-28 ENCOUNTER — Ambulatory Visit: Payer: 59

## 2020-09-28 DIAGNOSIS — R293 Abnormal posture: Secondary | ICD-10-CM

## 2020-09-28 DIAGNOSIS — M25512 Pain in left shoulder: Secondary | ICD-10-CM

## 2020-09-28 DIAGNOSIS — Z170 Estrogen receptor positive status [ER+]: Secondary | ICD-10-CM

## 2020-09-28 DIAGNOSIS — M25612 Stiffness of left shoulder, not elsewhere classified: Secondary | ICD-10-CM

## 2020-09-28 DIAGNOSIS — C50512 Malignant neoplasm of lower-outer quadrant of left female breast: Secondary | ICD-10-CM | POA: Diagnosis not present

## 2020-09-28 DIAGNOSIS — G8929 Other chronic pain: Secondary | ICD-10-CM

## 2020-09-28 NOTE — Therapy (Signed)
[place] [place], [place], [address] Phone: [phone]   Fax:  [phone]  Physical Therapy Treatment  Patient Details  Name: Lindsay [place] MRN: [id number] Date of Birth: [birth year] Referring Provider (PT): Dr. [name]   Encounter Date: 09/28/2020   PT End of Session - 09/28/20 1201    Visit Number 4    Number of Visits 10    PT Start Time 1104    PT Stop Time 2979    PT Time Calculation (min) 50 min    Activity Tolerance Patient tolerated treatment well    Behavior During Therapy [facility] for tasks assessed/performed           Past Medical History:  Diagnosis Date   Allergy    seasonal   Anemia    After childbirth   Anxiety    Breast cancer ([place])    Family history of breast cancer 07/26/2020   Family history of colon cancer 07/26/2020   Family history of prostate cancer 07/26/2020   GERD (gastroesophageal reflux disease)    Pneumonia    In 20s    Past Surgical History:  Procedure Laterality Date   AXILLARY LYMPH NODE DISSECTION Left 08/16/2020   Procedure: LEFT TARGETED AXILLARY LYMPH NODE DISSECTION;  Surgeon: [name], MD;  Location: [place];  Service: General;  Laterality: Left;   BREAST LUMPECTOMY WITH RADIOACTIVE SEED AND AXILLARY LYMPH NODE DISSECTION Left 08/16/2020   Procedure: LEFT BREAST LUMPECTOMY WITH RADIOACTIVE SEED;  Surgeon: [name], MD;  Location: [place];  Service: General;  Laterality: Left;  PEC BLOCK   BREAST SURGERY Left 06/2020   breast bx    CERVICAL BIOPSY  W/ LOOP ELECTRODE EXCISION  12/2017   CESAREAN SECTION     DILATION AND CURETTAGE OF UTERUS     PORTACATH PLACEMENT  09/12/2020   Procedure: INSERTION PORT-A-CATH WITH ULTRASOUND;  Surgeon: [name], MD;  Location: [facility];  Service: General;;   [place] EXTRACTION  2020    There were no vitals filed for this visit.   Subjective Assessment - 09/28/20 1101    Subjective Did very  well after last visit.  Felt really good and I think this is really helping. Can sleep more consistently on left side.  Patch is staying on well with no irritation.  Using arm a Markert more too.  Pain is improving overall.    Currently in Pain? Yes    Pain Score 3     Pain Location Shoulder    Pain Orientation Left;Anterior    Pain Descriptors / Indicators Aching    Pain Type Chronic pain    Pain Onset More than a month ago    Pain Frequency Intermittent    Pain Relieving Factors not reaching behind car seat                             [facility] Adult PT Treatment/Exercise - 09/28/20 0001      Shoulder Exercises: Supine   Protraction Strengthening;Left;12 reps    Protraction Weight (lbs) 1 lb    Other Supine Exercises supine ABC's 1#      Shoulder Exercises: Standing   Horizontal ABduction Both;12 reps    Theraband Level (Shoulder Horizontal ABduction) Level 1 (Yellow)    External Rotation Strengthening;Left;12 reps    Theraband Level (Shoulder External Rotation) Level 1 (Yellow)    Internal Rotation Strengthening;12 reps    Theraband  Level (Shoulder Internal Rotation) Level 1 (Yellow)    Extension Strengthening;Both    Theraband Level (Shoulder Extension) Level 1 (Yellow)    Row Both;12 reps    Theraband Level (Shoulder Row) Level 1 (Yellow)    Other Standing Exercises shoulder flex and scaption x 10      Shoulder Exercises: Pulleys   Flexion 2 minutes    Scaption 2 minutes      Shoulder Exercises: Stretch   Internal Rotation Stretch 5 reps   10 sec     Iontophoresis   Type of Iontophoresis Dexamethasone    Location Left proximal biceps    Dose 4mg /ml    Time 4 hr wear time                  PT Education - 09/28/20 1158    Education Details Pt. was educated in standing jobes flex and scaption, and IR stretch with strap and given illustrated and written instructions    Person(s) Educated Patient    Methods Explanation;Demonstration;Handout     Comprehension Verbalized understanding;Returned demonstration               PT Long Term Goals - 09/14/20 1109      PT LONG TERM GOAL #1   Title Patient will demonstrate she has regained full shoulder ROM and function post operatively compared to baseline measurements.    Time 4    Period Weeks    Status On-going    Target Date 10/12/20      PT LONG TERM GOAL #2   Title Patient will report >/= 25% reduction in left shoulder pain while performing daily tasks.    Time 4    Period Weeks    Status New    Target Date 10/12/20      PT LONG TERM GOAL #3   Title Patient will report >/= 25% improvement in her ability to sleep at night    Time 4    Period Weeks    Status New    Target Date 10/12/20      PT LONG TERM GOAL #4   Title Patient will increase left shoulder active flexion and abduction to >/= 145 degrees for increased each reaching overhead.    Time 4    Period Weeks    Status New    Target Date 10/12/20                 Plan - 09/28/20 1202    Clinical Impression Statement Pt consisted of left shoulder ROM, strength, stabilization , HEP and Iontophoresis.  She is compliant with HEP and can now sleep on her left shoulder, and notes good improvement in pain.  She is limited with IR behind her back and at end ranges of AROM flex and scaption secondary to pain,    Rehab Potential Excellent    PT Frequency 2x / week    PT Duration 4 weeks    PT Treatment/Interventions ADLs/Self Care Home Management;Therapeutic exercise;Patient/family education;Iontophoresis 4mg /ml Dexamethasone;Manual techniques;Joint Manipulations;Passive range of motion    PT Next Visit Plan Continue therex/ PROM, stab exs, TFM, Ionto,    PT Home Exercise Plan Post op shoulder ROM HEP, theraband exs, AROM serratus punch and SL ER, standing flex and scaption, IR with strap    Consulted and Agree with Plan of Care Patient           Patient will benefit from skilled therapeutic intervention in  order to improve the following deficits and  impairments:  Postural dysfunction, Decreased knowledge of precautions, Impaired UE functional use, Pain, Decreased range of motion  Visit Diagnosis: Malignant neoplasm of lower-outer quadrant of left breast of female, estrogen receptor positive ([facility])  Abnormal posture  Stiffness of left shoulder, not elsewhere classified  Chronic left shoulder pain     Problem List Patient Active Problem List   Diagnosis Date Noted   Genetic testing 08/01/2020   Family history of breast cancer 07/26/2020   Family history of prostate cancer 07/26/2020   Family history of colon cancer 07/26/2020   Malignant neoplasm of lower-outer quadrant of left breast of female, estrogen receptor positive ([place]) 07/20/2020    [facility] 09/28/2020, 12:09 PM  [place], [place], [address] Phone: [phone]   Fax:  [phone]  Name: GERARDO TERRITO MRN: [id number] Date of Birth: 1966/06/11

## 2020-09-28 NOTE — Patient Instructions (Signed)
Access Code: [email or web address] URL: [email or web address] Date: 09/28/2020 Prepared by: Cheral Almas  Exercises Standing Shoulder Internal Rotation Stretch with Towel - 2 x daily - 7 x weekly - 1 sets - 5 reps Standing Shoulder Flexion to 90 Degrees - 1 x daily - 7 x weekly - 1 sets - 10 reps Standing Shoulder Scaption - 1 x daily - 7 x weekly - 1 sets - 10 reps

## 2020-09-29 ENCOUNTER — Ambulatory Visit: Payer: 59

## 2020-10-06 ENCOUNTER — Ambulatory Visit: Payer: 59

## 2020-10-06 ENCOUNTER — Other Ambulatory Visit: Payer: Self-pay

## 2020-10-06 DIAGNOSIS — C50512 Malignant neoplasm of lower-outer quadrant of left female breast: Secondary | ICD-10-CM | POA: Diagnosis not present

## 2020-10-06 DIAGNOSIS — G8929 Other chronic pain: Secondary | ICD-10-CM

## 2020-10-06 DIAGNOSIS — R293 Abnormal posture: Secondary | ICD-10-CM

## 2020-10-06 DIAGNOSIS — Z170 Estrogen receptor positive status [ER+]: Secondary | ICD-10-CM

## 2020-10-06 DIAGNOSIS — M25612 Stiffness of left shoulder, not elsewhere classified: Secondary | ICD-10-CM

## 2020-10-06 NOTE — Therapy (Signed)
[place], [place], [address] Phone: [phone]   Fax:  [phone]  Physical Therapy Treatment  Patient Details  Name: Lindsay Hampton MRN: [id number] Date of Birth: 1966/11/13 Referring Provider (PT): Dr. [name]   Encounter Date: 10/06/2020   PT End of Session - 10/06/20 1157    Visit Number 4    Number of Visits 10    Date for PT Re-Evaluation 10/12/20    PT Start Time 1103    PT Stop Time 0017    PT Time Calculation (min) 52 min    Activity Tolerance Patient tolerated treatment well    Behavior During Therapy [facility] for tasks assessed/performed           Past Medical History:  Diagnosis Date   Allergy    seasonal   Anemia    After childbirth   Anxiety    Breast cancer ([place])    Family history of breast cancer 07/26/2020   Family history of colon cancer 07/26/2020   Family history of prostate cancer 07/26/2020   GERD (gastroesophageal reflux disease)    Pneumonia    In 20s    Past Surgical History:  Procedure Laterality Date   AXILLARY LYMPH NODE DISSECTION Left 08/16/2020   Procedure: LEFT TARGETED AXILLARY LYMPH NODE DISSECTION;  Surgeon: [name], MD;  Location: [place];  Service: General;  Laterality: Left;   BREAST LUMPECTOMY WITH RADIOACTIVE SEED AND AXILLARY LYMPH NODE DISSECTION Left 08/16/2020   Procedure: LEFT BREAST LUMPECTOMY WITH RADIOACTIVE SEED;  Surgeon: [name], MD;  Location: [place];  Service: General;  Laterality: Left;  PEC BLOCK   BREAST SURGERY Left 06/2020   breast bx    CERVICAL BIOPSY  W/ LOOP ELECTRODE EXCISION  12/2017   CESAREAN SECTION     DILATION AND CURETTAGE OF UTERUS     PORTACATH PLACEMENT  09/12/2020   Procedure: INSERTION PORT-A-CATH WITH ULTRASOUND;  Surgeon: [name], MD;  Location[facility];  Service: General;;   [place] EXTRACTION  2020    There were no vitals filed for this visit.   Subjective Assessment -  10/06/20 1105    Subjective Had a great time at [facility] and just got back last night.  Kept up with my exercises.  Shoulder is definitely getting better. Feel like my ROM is better.  Still can't sleep on arm for more than an hour or 2.    Pertinent History Patient was diagnosed on 07/06/2020 with left grade II invasive ductal carcinoma breast cancer. Patient reports she underwent a left lumpectomy and sentinel node biopsy (1/2 nodes positive) on 08/16/2020. It is ER/PR positive and HER2 negative with a Ki67 of 15%. She has a positive axillary lymph node.    Patient Stated Goals See how my arm is doing    Currently in Pain? Yes    Pain Score 3     Pain Location Shoulder    Pain Orientation Left;Anterior    Pain Descriptors / Indicators Aching;Dull    Pain Type Chronic pain    Pain Onset More than a month ago    Pain Frequency Intermittent                             [facility] Adult PT Treatment/Exercise - 10/06/20 0001      Neuro Re-ed    Neuro Re-ed Details  alternating isometrics IR/ER 3 x 20 sec, rhythmic  stabs 3 x 20 at 90 deg      Shoulder Exercises: Supine   Protraction Strengthening;Both;15 reps;Weights    Protraction Weight (lbs) 1 lb    Other Supine Exercises supine ABC's 1#      Shoulder Exercises: Sidelying   Other Sidelying Exercises ER 2 x 10 1#      Shoulder Exercises: Standing   External Rotation Strengthening;Left;12 reps;15 reps    Theraband Level (Shoulder External Rotation) Level 1 (Yellow)    Internal Rotation Strengthening;15 reps    Theraband Level (Shoulder Internal Rotation) Level 1 (Yellow)    Extension Strengthening;Both;15 reps    Theraband Level (Shoulder Extension) Level 1 (Yellow)    Row Strengthening;Both;15 reps    Theraband Level (Shoulder Row) Level 1 (Yellow)    Other Standing Exercises shoulder flex and scaption x 12    Other Standing Exercises Elbow flexion 2# x 12 B      Shoulder Exercises: Pulleys   Flexion 2 minutes      Scaption 2 minutes      Shoulder Exercises: Therapy Ball   Flexion Both;10 reps   yellow ball   Other Therapy Ball Exercises 2 D ball rolls on wall   yellow x 10     Iontophoresis   Type of Iontophoresis Dexamethasone    Location Left proximal biceps    Dose 64m/ml    Time 4 hr wear time      Manual Therapy   Soft tissue mobilization Transverse friction massage left prox biceps    Passive ROM Left shoulder flex, scaption, IR and ER                       PT Long Term Goals - 09/14/20 1109      PT LONG TERM GOAL #1   Title Patient will demonstrate she has regained full shoulder ROM and function post operatively compared to baseline measurements.    Time 4    Period Weeks    Status On-going    Target Date 10/12/20      PT LONG TERM GOAL #2   Title Patient will report >/= 25% reduction in left shoulder pain while performing daily tasks.    Time 4    Period Weeks    Status New    Target Date 10/12/20      PT LONG TERM GOAL #3   Title Patient will report >/= 25% improvement in her ability to sleep at night    Time 4    Period Weeks    Status New    Target Date 10/12/20      PT LONG TERM GOAL #4   Title Patient will increase left shoulder active flexion and abduction to >/= 145 degrees for increased each reaching overhead.    Time 4    Period Weeks    Status New    Target Date 10/12/20                 Plan - 10/06/20 1157    Clinical Impression Statement therapy consisted of AAROM, PROM, strengthening, stabilization, HEP and Iontophoresis to left shoulder.  Pt continues to note good improvement with pain, but is still limited  greatest with abd secondary to pain,.  She has good tolerance for progression of repetitions with exs with fatigue only    Stability/Clinical Decision Making Stable/Uncomplicated    Rehab Potential Excellent    PT Frequency 2x / week    PT Duration 4 weeks  PT Treatment/Interventions ADLs/Self Care Home  Management;Therapeutic exercise;Patient/family education;Iontophoresis 110m/ml Dexamethasone;Manual techniques;Joint Manipulations;Passive range of motion    PT Next Visit Plan Continue therex/ PROM, stab exs, TFM, Ionto,    PT Home Exercise Plan Post op shoulder ROM HEP, theraband exs, AROM serratus punch and SL ER, standing flex and scaption, IR with strap    Consulted and Agree with Plan of Care Patient           Patient will benefit from skilled therapeutic intervention in order to improve the following deficits and impairments:  Postural dysfunction, Decreased knowledge of precautions, Impaired UE functional use, Pain, Decreased range of motion  Visit Diagnosis: Malignant neoplasm of lower-outer quadrant of left breast of female, estrogen receptor positive ([facility])  Abnormal posture  Stiffness of left shoulder, not elsewhere classified  Chronic left shoulder pain     Problem List Patient Active Problem List   Diagnosis Date Noted   Genetic testing 08/01/2020   Family history of breast cancer 07/26/2020   Family history of prostate cancer 07/26/2020   Family history of colon cancer 07/26/2020   Malignant neoplasm of lower-outer quadrant of left breast of female, estrogen receptor positive ([place] 07/20/2020    RClaris Pong11/10/2020, 12:02 PM  [place] [address]Phone: [phone]  Fax:  [phone] Name: Lindsay BURRILLMRN: [id number] of Birth: 729-Feb-1967

## 2020-10-09 ENCOUNTER — Other Ambulatory Visit: Payer: Self-pay

## 2020-10-09 ENCOUNTER — Ambulatory Visit: Payer: 59

## 2020-10-09 DIAGNOSIS — C50512 Malignant neoplasm of lower-outer quadrant of left female breast: Secondary | ICD-10-CM

## 2020-10-09 DIAGNOSIS — Z170 Estrogen receptor positive status [ER+]: Secondary | ICD-10-CM

## 2020-10-09 DIAGNOSIS — M25612 Stiffness of left shoulder, not elsewhere classified: Secondary | ICD-10-CM

## 2020-10-09 DIAGNOSIS — G8929 Other chronic pain: Secondary | ICD-10-CM

## 2020-10-09 DIAGNOSIS — R293 Abnormal posture: Secondary | ICD-10-CM

## 2020-10-09 NOTE — Therapy (Signed)
[place], [place], [address] Phone: [phone]   Fax:  [phone]  Physical Therapy Treatment  Patient Details  Name: Lindsay Hampton MRN: [id number] Date of Birth: 1966-08-25 Referring Provider (PT): Dr. [name]   Encounter Date: 10/09/2020   PT End of Session - 10/09/20 1154    Visit Number 5    Number of Visits 10    Date for PT Re-Evaluation 10/12/20    PT Start Time 4888    PT Stop Time 1151    PT Time Calculation (min) 46 min    Activity Tolerance Patient tolerated treatment well    Behavior During Therapy [facility] for tasks assessed/performed           Past Medical History:  Diagnosis Date  . Allergy    seasonal  . Anemia    After childbirth  . Anxiety   . Breast cancer ([place])   . Family history of breast cancer 07/26/2020  . Family history of colon cancer 07/26/2020  . Family history of prostate cancer 07/26/2020  . GERD (gastroesophageal reflux disease)   . Pneumonia    In 20s    Past Surgical History:  Procedure Laterality Date  . AXILLARY LYMPH NODE DISSECTION Left 08/16/2020   Procedure: LEFT TARGETED AXILLARY LYMPH NODE DISSECTION;  Surgeon: [name], MD;  Location: [facility];  Service: General;  Laterality: Left;  . BREAST LUMPECTOMY WITH RADIOACTIVE SEED AND AXILLARY LYMPH NODE DISSECTION Left 08/16/2020   Procedure: LEFT BREAST LUMPECTOMY WITH RADIOACTIVE SEED;  Surgeon: [name], MD;  Location: [place] [facility];  Service: General;  Laterality: Left;  PEC BLOCK  . BREAST SURGERY Left 06/2020   breast bx   . CERVICAL BIOPSY  W/ LOOP ELECTRODE EXCISION  12/2017  . CESAREAN SECTION    . DILATION AND CURETTAGE OF UTERUS    . PORTACATH PLACEMENT  09/12/2020   Procedure: INSERTION PORT-A-CATH WITH ULTRASOUND;  Surgeon: [name], MD;  Location: [facility];  Service: General;;  . WISDOM TOOTH EXTRACTION  2020    There were no vitals filed for this visit.   Subjective Assessment -  10/09/20 1101    Subjective Left shoulder continues to do well, but it revs up at night when I am sleeping.  If I sleep on my right I have to prop my left arm on pillows, and  if I sleep on left I can't stay there long.  Once in a while during the day I notice it.  During the day it is 40% better overall.    Pertinent History Patient was diagnosed on 07/06/2020 with left grade II invasive ductal carcinoma breast cancer. Patient reports she underwent a left lumpectomy and sentinel node biopsy (1/2 nodes positive) on 08/16/2020. It is ER/PR positive and HER2 negative with a Ki67 of 15%. She has a positive axillary lymph node.    Currently in Pain? Yes    Pain Score 5     Pain Orientation Left    Pain Descriptors / Indicators Aching;Dull    Pain Type Chronic pain    Pain Onset More than a month ago    Pain Frequency Intermittent                             [facility] Adult PT Treatment/Exercise - 10/09/20 0001      Neuro Re-ed    Neuro Re-ed Details  alternating isometrics IR/ER 3 x 20  sec, rhythmic stabs 3 x 20 at 90 deg      Shoulder Exercises: Supine   Protraction Strengthening;Both;15 reps;Weights    Protraction Weight (lbs) 1 lb      Shoulder Exercises: Sidelying   Other Sidelying Exercises ER 2 x 10 1#      Shoulder Exercises: Standing   Horizontal ABduction Strengthening;Both;15 reps    Theraband Level (Shoulder Horizontal ABduction) Level 1 (Yellow)    External Rotation Strengthening;Left;15 reps    Theraband Level (Shoulder External Rotation) Level 1 (Yellow)    Internal Rotation Strengthening;15 reps    Theraband Level (Shoulder Internal Rotation) Level 1 (Yellow)    Extension Strengthening;Both;15 reps    Theraband Level (Shoulder Extension) Level 1 (Yellow)    Row Strengthening;Both;15 reps    Theraband Level (Shoulder Row) Level 1 (Yellow)    Other Standing Exercises shoulder flex and scaption x 12    Other Standing Exercises Elbow flexion 2# x 12 B       Shoulder Exercises: Pulleys   Flexion 2 minutes    Scaption 2 minutes      Shoulder Exercises: Therapy Ball   Flexion Both;10 reps   yellow ball   Other Therapy Ball Exercises 2 D ball rolls on wall   yellow x 10     Iontophoresis   Type of Iontophoresis Dexamethasone    Location Left proximal biceps    Dose 39m/ml    Time 4 hr wear time      Manual Therapy   Passive ROM Left shoulder flex, scaption, IR and ER                  PT Education - 10/09/20 1152    Education Details pt was educated in prophylactic compression sleeve and has given me permission to have her benefits checked    Person(s) Educated Patient    Methods Explanation    Comprehension Verbalized understanding               PT Long Term Goals - 09/14/20 1109      PT LONG TERM GOAL #1   Title Patient will demonstrate she has regained full shoulder ROM and function post operatively compared to baseline measurements.    Time 4    Period Weeks    Status On-going    Target Date 10/12/20      PT LONG TERM GOAL #2   Title Patient will report >/= 25% reduction in left shoulder pain while performing daily tasks.    Time 4    Period Weeks    Status New    Target Date 10/12/20      PT LONG TERM GOAL #3   Title Patient will report >/= 25% improvement in her ability to sleep at night    Time 4    Period Weeks    Status New    Target Date 10/12/20      PT LONG TERM GOAL #4   Title Patient will increase left shoulder active flexion and abduction to >/= 145 degrees for increased each reaching overhead.    Time 4    Period Weeks    Status New    Target Date 10/12/20                 Plan - 10/09/20 1154    Clinical Impression Statement Pt with excellent improvement in PROM today.  Progressing well with strengthening, stabilization , HEP and Ionto.  40 % reduction in daytime pain overall  Stability/Clinical Decision Making Stable/Uncomplicated    Clinical Decision Making Low    Rehab  Potential Excellent    PT Frequency 2x / week    PT Duration 4 weeks    PT Treatment/Interventions ADLs/Self Care Home Management;Therapeutic exercise;Patient/family education;Iontophoresis 75m/ml Dexamethasone;Manual techniques;Joint Manipulations;Passive range of motion    PT Next Visit Plan Continue therex/ PROM, stab exs, TFM, Ionto,see if benefits have been approved for sleeve/gauntlet.  Sending to SBadger Leeop shoulder ROM HEP, theraband exs, AROM serratus punch and SL ER, standing flex and scaption, IR with strap    Consulted and Agree with Plan of Care Patient           Patient will benefit from skilled therapeutic intervention in order to improve the following deficits and impairments:  Postural dysfunction, Decreased knowledge of precautions, Impaired UE functional use, Pain, Decreased range of motion  Visit Diagnosis: Malignant neoplasm of lower-outer quadrant of left breast of female, estrogen receptor positive (HCC)  Abnormal posture  Stiffness of left shoulder, not elsewhere classified  Chronic left shoulder pain     Problem List Patient Active Problem List   Diagnosis Date Noted  . Genetic testing 08/01/2020  . Family history of breast cancer 07/26/2020  . Family history of prostate cancer 07/26/2020  . Family history of colon cancer 07/26/2020  . Malignant neoplasm of lower-outer quadrant of left breast of female, estrogen receptor positive [place] 07/20/2020    RClaris Pong PT 10/09/2020, 12:00 PM  [place] [address]: [phone]  Fax:  [phone] Name: RDARREL BARONIMRN: [id number] of Birth: 7Apr 08, 1967

## 2020-10-10 NOTE — Assessment & Plan Note (Signed)
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 T2 N1 M0 stage Ib MammaPrint: High risk (average 10-year risk untreated: 29%, predicted benefit of chemo with hormone therapy at 5 years: 94.6%  Treatment plan: 1.Adjuvant chemotherapy withdose dense Adriamycin and Cytoxan x4 followed by Taxol x12 2.followed by adjuvant radiation 3.Follow-up adjuvant antiestrogen therapy ------------------------------------------------------------------------------------------------------------------------------------ Current treatment: Cycle 3dose Adriamycin and Cytoxan Echocardiogram 09/08/2020: EF 60 to 65%  Chemo toxicities: Denies any adverse effects. Did not have any nausea or vomiting. WBC count low as expected. She tolerated treatment extremely well. Mild fatigue.  Return to clinic in 2 weeks forcycle 4

## 2020-10-10 NOTE — Progress Notes (Signed)
Patient Care Team: [name], [place] as PCP - General (Internal Medicine) [name], RN as Oncology Nurse Navigator [name], RN as Oncology Nurse Navigator [name], MD as Consulting Physician (General Surgery) [name], MD as Consulting Physician (Hematology and Oncology) Kyung Rudd, MD as Consulting Physician (Radiation Oncology)  DIAGNOSIS:    ICD-10-CM   1. Malignant neoplasm of lower-outer quadrant of left breast of female, estrogen receptor positive ([place])  C50.512    Z17.0     SUMMARY OF ONCOLOGIC HISTORY: Oncology History  Malignant neoplasm of lower-outer quadrant of left breast of female, estrogen receptor positive ([place])  07/20/2020 Initial Diagnosis   Screening mammogram detected left breast calcifications, enlarged left axillary lymph nodes, and an asymmetry in the right breast. Diagnostic mammogram showed in the left breast, a 2.4cm mass with calcifications, 3:30 position, a left axillary lymph node with cortical thickness, and in the right breast, no suspicious masses or calcifications. Left breast biopsy showed invasive and in situ mammary carcinoma in the breast and axilla, grade 2, HER-2 equivocal by IHC (2+), negative by FISH, ER+ 95%, PR+40%, Ki67 15%.    07/26/2020 Cancer Staging   Staging form: Breast, AJCC 8th Edition - Clinical stage from 07/26/2020: Stage IIA (cT2, cN1(f), cM0, G2, ER+, PR+, HER2-) - Signed by [name], MD on 07/26/2020   08/01/2020 Genetic Testing   No pathogenic variants detected in [facility] Multi-Cancer Panel.  The Multi-Cancer Panel offered by [facility] includes sequencing and/or deletion duplication testing of the following 85 genes: AIP, ALK, APC, ATM, AXIN2,BAP1,  BARD1, BLM, BMPR1A, BRCA1, BRCA2, BRIP1, CASR, CDC73, CDH1, CDK4, CDKN1B, CDKN1C, CDKN2A (p14ARF), CDKN2A (p16INK4a), CEBPA, CHEK2, CTNNA1, DICER1, DIS3L2, EGFR (c.2369C>T, p.Thr790Met variant only), EPCAM (Deletion/duplication testing only), FH, FLCN,  GATA2, GPC3, GREM1 (Promoter region deletion/duplication testing only), HOXB13 (c.251G>A, p.Gly84Glu), HRAS, KIT, MAX, MEN1, MET, MITF (c.952G>A, p.Glu318Lys variant only), MLH1, MSH2, MSH3, MSH6, MUTYH, NBN, NF1, NF2, NTHL1, PALB2, PDGFRA, PHOX2B, PMS2, POLD1, POLE, POT1, PRKAR1A, PTCH1, PTEN, RAD50, RAD51C, RAD51D, RB1, RECQL4, RET, RNF43, RUNX1, SDHAF2, SDHA (sequence changes only), SDHB, SDHC, SDHD, SMAD4, SMARCA4, SMARCB1, SMARCE1, STK11, SUFU, TERC, TERT, TMEM127, TP53, TSC1, TSC2, VHL, WRN and WT1.  The report date is August 01, 2020.    08/16/2020 Surgery   Left lumpectomy [name]): IDC, grade 2, 2.3cm, with intermediate grade DCIS, clear margins, 1/2 left axillary lymph nodes positive for carcinoma.HER-2 equivocal by IHC (2+), negative by FISH, ER+ 95%, PR+40%, Ki67 15%.    08/24/2020 Cancer Staging   Staging form: Breast, AJCC 8th Edition - Pathologic stage from 08/24/2020: Stage IB (pT2, pN1a, cM0, G2, ER+, PR+, HER2-) - Signed by [name], MD on 08/24/2020   08/30/2020 Miscellaneous   MammaPrint high risk   09/13/2020 -  Chemotherapy   The patient had dexamethasone (DECADRON) 4 MG tablet, 4 mg (100 % of original dose 4 mg), Oral,  Once, 1 of 1 cycle, Start date: 09/04/2020, End date: 09/04/2020 Dose modification: 4 mg (original dose 4 mg, Cycle 0) DOXOrubicin (ADRIAMYCIN) chemo injection 130 mg, 60 mg/m2 = 130 mg, Intravenous,  Once, 2 of 4 cycles Administration: 130 mg (09/13/2020), 130 mg (09/27/2020) ondansetron (ZOFRAN) 16 mg in sodium chloride 0.9 % 50 mL IVPB, 16 mg (100 % of original dose 16 mg), Intravenous,  Once, 2 of 4 cycles Dose modification: 16 mg (original dose 16 mg, Cycle 1) Administration: 16 mg (09/13/2020), 16 mg (09/27/2020) cyclophosphamide (CYTOXAN) 1,300 mg in sodium chloride 0.9 % 250 mL chemo infusion, 600  mg/m2 = 1,300 mg, Intravenous,  Once, 2 of 4 cycles Administration: 1,300 mg (09/13/2020), 1,300 mg (09/27/2020) PACLitaxel (TAXOL) 174 mg in sodium  chloride 0.9 % 250 mL chemo infusion (</= 53m/m2), 80 mg/m2 = 174 mg, Intravenous,  Once, 0 of 12 cycles fosaprepitant (EMEND) 150 mg in sodium chloride 0.9 % 145 mL IVPB, 150 mg, Intravenous,  Once, 2 of 4 cycles Administration: 150 mg (09/13/2020), 150 mg (09/27/2020)  for chemotherapy treatment.      CHIEF COMPLIANT: Cycle 3Adriamycin and Cytoxan  INTERVAL HISTORY: Lindsay CUELLOis a 54y.o. with above-mentioned history of left breast cancer treated with lumpectomyand who is currently on adjuvant chemotherapy withdose dense Adriamycin and Cytoxan.She presents to the clinic todayfora toxicity check andcycle 3.  Other than mild nausea there is no other side effects to treatment.  ALLERGIES:  has No Known Allergies.  MEDICATIONS:  Current Outpatient Medications  Medication Sig Dispense Refill  . ALPRAZolam (XANAX) 0.25 MG tablet Take 1 tablet (0.25 mg total) by mouth 2 (two) times daily as needed for anxiety. 60 tablet 3  . dexamethasone (DECADRON) 4 MG tablet Take 4 mg by mouth daily.    .Marland Kitchenlidocaine-prilocaine (EMLA) cream Apply to affected area once (Patient taking differently: Apply 1 application topically as needed. Apply to affected area once) 30 g 3  . metroNIDAZOLE (METROCREAM) 0.75 % cream Apply 1 application topically once a week. Apply to face daily    . ondansetron (ZOFRAN) 8 MG tablet Take 1 tablet (8 mg total) by mouth 2 (two) times daily as needed. Start on the third day after chemotherapy. 30 tablet 1  . prochlorperazine (COMPAZINE) 10 MG tablet Take 1 tablet (10 mg total) by mouth every 6 (six) hours as needed (Nausea or vomiting). 30 tablet 1  . traMADol (ULTRAM) 50 MG tablet Take 1 tablet (50 mg total) by mouth every 6 (six) hours as needed for moderate pain or severe pain. 12 tablet 0   No current facility-administered medications for this visit.    PHYSICAL EXAMINATION: ECOG PERFORMANCE STATUS: 1 - Symptomatic but completely ambulatory  Vitals:   10/11/20  0835  BP: 127/89  Pulse: 96  Resp: 18  Temp: 98.1 F (36.7 C)  SpO2: 98%   Filed Weights   10/11/20 0835  Weight: 217 lb 14.4 oz (98.8 kg)    LABORATORY DATA:  I have reviewed the data as listed CMP Latest Ref Rng & Units 09/27/2020 09/20/2020 09/13/2020  Glucose 70 - 99 mg/dL 115(H) 123(H) 90  BUN 6 - 20 mg/dL 12 16 11   Creatinine 0.44 - 1.00 mg/dL 0.75 0.86 0.78  Sodium 135 - 145 mmol/L 139 137 138  Potassium 3.5 - 5.1 mmol/L 4.2 4.1 3.8  Chloride 98 - 111 mmol/L 108 105 109  CO2 22 - 32 mmol/L 22 25 22   Calcium 8.9 - 10.3 mg/dL 9.3 10.1 9.3  Total Protein 6.5 - 8.1 g/dL 7.1 6.7 6.7  Total Bilirubin 0.3 - 1.2 mg/dL 0.3 0.7 0.5  Alkaline Phos 38 - 126 U/L 48 49 37(L)  AST 15 - 41 U/L 29 21 23   ALT 0 - 44 U/L 46(H) 37 50(H)    Lab Results  Component Value Date   WBC 9.8 10/11/2020   HGB 11.7 (L) 10/11/2020   HCT 34.2 (L) 10/11/2020   MCV 92.2 10/11/2020   PLT 235 10/11/2020   NEUTROABS PENDING 10/11/2020    ASSESSMENT & PLAN:  Malignant neoplasm of lower-outer quadrant of left breast of female,  estrogen receptor positive ([place]) 08/16/2020:Left lumpectomy [name]): IDC, grade 2, 2.3cm, with intermediate grade DCIS, clear margins, 1/2 left axillary lymph nodes positive for carcinoma.HER-2 equivocal by IHC (2+), negative by FISH, ER+ 95%, PR+40%, Ki67 15%. T2 N1 M0 stage Ib MammaPrint: High risk (average 10-year risk untreated: 29%, predicted benefit of chemo with hormone therapy at 5 years: 94.6%  Treatment plan: 1.Adjuvant chemotherapy withdose dense Adriamycin and Cytoxan x4 followed by Taxol x12 2.followed by adjuvant radiation 3.Follow-up adjuvant antiestrogen therapy ------------------------------------------------------------------------------------------------------------------------------------ Current treatment: Cycle 3dose Adriamycin and Cytoxan Echocardiogram 09/08/2020: EF 60 to 65%  Chemo toxicities: Denies any adverse effects. Did not have  any nausea or vomiting.  She tolerated treatment extremely well. Mild fatigue.  Return to clinic in 2 weeks forcycle 4    No orders of the defined types were placed in this encounter.  The patient has a good understanding of the overall plan. she agrees with it. she will call with any problems that may develop before the next visit here.  Total time spent: 30 mins including face to face time and time spent for planning, charting and coordination of care  [name], MD 10/11/2020  I, Cloyde Reams Dorshimer, am acting as scribe for Dr. [name].  I have reviewed the above documentation for accuracy and completeness, and I agree with the above.

## 2020-10-11 ENCOUNTER — Inpatient Hospital Stay (HOSPITAL_BASED_OUTPATIENT_CLINIC_OR_DEPARTMENT_OTHER): Payer: 59 | Admitting: Hematology and Oncology

## 2020-10-11 ENCOUNTER — Other Ambulatory Visit: Payer: Self-pay

## 2020-10-11 ENCOUNTER — Inpatient Hospital Stay: Payer: 59

## 2020-10-11 DIAGNOSIS — C50512 Malignant neoplasm of lower-outer quadrant of left female breast: Secondary | ICD-10-CM | POA: Diagnosis not present

## 2020-10-11 DIAGNOSIS — Z95828 Presence of other vascular implants and grafts: Secondary | ICD-10-CM

## 2020-10-11 DIAGNOSIS — Z170 Estrogen receptor positive status [ER+]: Secondary | ICD-10-CM | POA: Diagnosis not present

## 2020-10-11 LAB — CBC WITH DIFFERENTIAL (CANCER CENTER ONLY)
Abs Immature Granulocytes: 0.88 10*3/uL — ABNORMAL HIGH (ref 0.00–0.07)
Basophils Absolute: 0 10*3/uL (ref 0.0–0.1)
Basophils Relative: 0 %
Eosinophils Absolute: 0 10*3/uL (ref 0.0–0.5)
Eosinophils Relative: 0 %
HCT: 34.2 % — ABNORMAL LOW (ref 36.0–46.0)
Hemoglobin: 11.7 g/dL — ABNORMAL LOW (ref 12.0–15.0)
Immature Granulocytes: 9 %
Lymphocytes Relative: 18 %
Lymphs Abs: 1.8 10*3/uL (ref 0.7–4.0)
MCH: 31.5 pg (ref 26.0–34.0)
MCHC: 34.2 g/dL (ref 30.0–36.0)
MCV: 92.2 fL (ref 80.0–100.0)
Monocytes Absolute: 0.7 10*3/uL (ref 0.1–1.0)
Monocytes Relative: 7 %
Neutro Abs: 6.4 10*3/uL (ref 1.7–7.7)
Neutrophils Relative %: 66 %
Platelet Count: 235 10*3/uL (ref 150–400)
RBC: 3.71 MIL/uL — ABNORMAL LOW (ref 3.87–5.11)
RDW: 14.6 % (ref 11.5–15.5)
WBC Count: 9.8 10*3/uL (ref 4.0–10.5)
nRBC: 0.6 % — ABNORMAL HIGH (ref 0.0–0.2)

## 2020-10-11 LAB — CMP (CANCER CENTER ONLY)
ALT: 34 U/L (ref 0–44)
AST: 28 U/L (ref 15–41)
Albumin: 3.8 g/dL (ref 3.5–5.0)
Alkaline Phosphatase: 52 U/L (ref 38–126)
Anion gap: 9 (ref 5–15)
BUN: 16 mg/dL (ref 6–20)
CO2: 24 mmol/L (ref 22–32)
Calcium: 9.1 mg/dL (ref 8.9–10.3)
Chloride: 107 mmol/L (ref 98–111)
Creatinine: 0.78 mg/dL (ref 0.44–1.00)
GFR, Estimated: >60 mL/min (ref >60)
Glucose, Bld: 124 mg/dL — ABNORMAL HIGH (ref 70–99)
Potassium: 3.9 mmol/L (ref 3.5–5.1)
Sodium: 140 mmol/L (ref 135–145)
Total Bilirubin: 0.4 mg/dL (ref 0.3–1.2)
Total Protein: 6.8 g/dL (ref 6.5–8.1)

## 2020-10-11 MED ORDER — SODIUM CHLORIDE 0.9% FLUSH
10.0000 mL | Freq: Once | INTRAVENOUS | Status: AC
  Administered 2020-10-11: 10 mL via INTRAVENOUS
  Filled 2020-10-11: qty 10

## 2020-10-11 MED ORDER — SODIUM CHLORIDE 0.9 % IV SOLN
150.0000 mg | Freq: Once | INTRAVENOUS | Status: AC
  Administered 2020-10-11: 150 mg via INTRAVENOUS
  Filled 2020-10-11: qty 150

## 2020-10-11 MED ORDER — SODIUM CHLORIDE 0.9 % IV SOLN
Freq: Once | INTRAVENOUS | Status: AC
  Filled 2020-10-11: qty 250

## 2020-10-11 MED ORDER — SODIUM CHLORIDE 0.9 % IV SOLN
10.0000 mg | Freq: Once | INTRAVENOUS | Status: AC
  Administered 2020-10-11: 10 mg via INTRAVENOUS
  Filled 2020-10-11: qty 10

## 2020-10-11 MED ORDER — SODIUM CHLORIDE 0.9 % IV SOLN
16.0000 mg | Freq: Once | INTRAVENOUS | Status: AC
  Administered 2020-10-11: 16 mg via INTRAVENOUS
  Filled 2020-10-11: qty 8

## 2020-10-11 MED ORDER — SODIUM CHLORIDE 0.9 % IV SOLN
600.0000 mg/m2 | Freq: Once | INTRAVENOUS | Status: AC
  Administered 2020-10-11: 1300 mg via INTRAVENOUS
  Filled 2020-10-11: qty 65

## 2020-10-11 MED ORDER — DOXORUBICIN HCL CHEMO IV INJECTION 2 MG/ML
60.0000 mg/m2 | Freq: Once | INTRAVENOUS | Status: AC
  Administered 2020-10-11: 130 mg via INTRAVENOUS
  Filled 2020-10-11: qty 65

## 2020-10-11 MED ORDER — HEPARIN SOD (PORK) LOCK FLUSH 100 UNIT/ML IV SOLN
500.0000 [IU] | Freq: Once | INTRAVENOUS | Status: AC
  Administered 2020-10-11: 500 [IU] via INTRAVENOUS
  Filled 2020-10-11: qty 5

## 2020-10-11 MED ORDER — SODIUM CHLORIDE 0.9% FLUSH
10.0000 mL | INTRAVENOUS | Status: AC | PRN
  Administered 2020-10-11: 10 mL
  Filled 2020-10-11: qty 10

## 2020-10-11 NOTE — Patient Instructions (Signed)
[facility] Discharge Instructions for Patients Receiving Chemotherapy  Today you received the following chemotherapy agents: Doxorubicin and Cytoxan  To help prevent nausea and vomiting after your treatment, we encourage you to take your nausea medication  as prescribed.    If you develop nausea and vomiting that is not controlled by your nausea medication, call the clinic.   BELOW ARE SYMPTOMS THAT SHOULD BE REPORTED IMMEDIATELY:  *FEVER GREATER THAN 100.5 F  *CHILLS WITH OR WITHOUT FEVER  NAUSEA AND VOMITING THAT IS NOT CONTROLLED WITH YOUR NAUSEA MEDICATION  *UNUSUAL SHORTNESS OF BREATH  *UNUSUAL BRUISING OR BLEEDING  TENDERNESS IN MOUTH AND THROAT WITH OR WITHOUT PRESENCE OF ULCERS  *URINARY PROBLEMS  *BOWEL PROBLEMS  UNUSUAL RASH Items with * indicate a potential emergency and should be followed up as soon as possible.  Feel free to call the clinic should you have any questions or concerns. The clinic phone number is [phone].  Please show the [facility] at check-in to the Emergency Department and triage nurse.

## 2020-10-12 ENCOUNTER — Encounter: Payer: Self-pay | Admitting: *Deleted

## 2020-10-12 ENCOUNTER — Ambulatory Visit: Payer: 59

## 2020-10-12 DIAGNOSIS — R293 Abnormal posture: Secondary | ICD-10-CM

## 2020-10-12 DIAGNOSIS — G8929 Other chronic pain: Secondary | ICD-10-CM

## 2020-10-12 DIAGNOSIS — C50512 Malignant neoplasm of lower-outer quadrant of left female breast: Secondary | ICD-10-CM

## 2020-10-12 DIAGNOSIS — Z170 Estrogen receptor positive status [ER+]: Secondary | ICD-10-CM

## 2020-10-12 DIAGNOSIS — M25612 Stiffness of left shoulder, not elsewhere classified: Secondary | ICD-10-CM

## 2020-10-12 DIAGNOSIS — M25512 Pain in left shoulder: Secondary | ICD-10-CM

## 2020-10-12 NOTE — Therapy (Addendum)
[place], [place], [address] Phone: [phone]   Fax:  [phone]  Physical Therapy Treatment  Patient Details  Name: Lindsay Hampton MRN: [id number] Date of Birth: [birth year] Referring Provider (PT): Dr. [name]   Encounter Date: 10/12/2020   PT End of Session - 10/12/20 1152    Visit Number 6    Number of Visits 10    Date for PT Re-Evaluation 11/09/20    PT Start Time 1100    PT Stop Time 1150    PT Time Calculation (min) 50 min    Activity Tolerance Patient tolerated treatment well           Past Medical History:  Diagnosis Date  . Allergy    seasonal  . Anemia    After childbirth  . Anxiety   . Breast cancer ([facility])   . Family history of breast cancer 07/26/2020  . Family history of colon cancer 07/26/2020  . Family history of prostate cancer 07/26/2020  . GERD (gastroesophageal reflux disease)   . Pneumonia    In 20s    Past Surgical History:  Procedure Laterality Date  . AXILLARY LYMPH NODE DISSECTION Left 08/16/2020   Procedure: LEFT TARGETED AXILLARY LYMPH NODE DISSECTION;  Surgeon: [name], MD;  Location: [facility];  Service: General;  Laterality: Left;  . BREAST LUMPECTOMY WITH RADIOACTIVE SEED AND AXILLARY LYMPH NODE DISSECTION Left 08/16/2020   Procedure: LEFT BREAST LUMPECTOMY WITH RADIOACTIVE SEED;  Surgeon: [name], MD;  Location: [facility];  Service: General;  Laterality: Left;  PEC BLOCK  . BREAST SURGERY Left 06/2020   breast bx   . CERVICAL BIOPSY  W/ LOOP ELECTRODE EXCISION  12/2017  . CESAREAN SECTION    . DILATION AND CURETTAGE OF UTERUS    . PORTACATH PLACEMENT  09/12/2020   Procedure: INSERTION PORT-A-CATH WITH ULTRASOUND;  Surgeon: [name], MD;  Location: [facility];  Service: General;;  . WISDOM TOOTH EXTRACTION  2020    There were no vitals filed for this visit.   Subjective Assessment - 10/12/20 1059    Subjective Able to lay on shoulder a lot  longer last night.  Slept the best I have in a long time.  Overall much better with ROM. 1 more visit of the chemo every 2 weeks, and then start taxol every week 2 weeks after that for 12 weeks. I feel like I can continue on my own at home now.   Patient Stated Goals See how my arm is doing    Currently in Pain? Yes    Pain Location Shoulder    Pain Orientation Left    Pain Descriptors / Indicators Aching              [facility] PT Assessment - 10/12/20 0001      AROM   Left Shoulder Extension 50 Degrees    Left Shoulder Flexion 149 Degrees    Left Shoulder ABduction 155 Degrees    Left Shoulder Internal Rotation 77 Degrees    Left Shoulder External Rotation 92 Degrees      Special Tests    Special Tests Rotator Cuff Impingement   mildly positive   Rotator Cuff Impingment tests Empty Can test   mildly positive            LYMPHEDEMA/ONCOLOGY QUESTIONNAIRE - 10/12/20 0001      Right Upper Extremity Lymphedema   10 cm Proximal to Olecranon Process 36 cm  Olecranon Process 26.7 cm    10 cm Proximal to Ulnar Styloid Process 22.5 cm    Just Proximal to Ulnar Styloid Process 18.5 cm    Across Hand at [employer] 20.8 cm    At [place] of 2nd Digit 7 cm      Left Upper Extremity Lymphedema   10 cm Proximal to Olecranon Process 36.7 cm    Olecranon Process 27.8 cm    15 cm Proximal to Ulnar Styloid Process 26.9 cm    10 cm Proximal to Ulnar Styloid Process 23.3 cm    Just Proximal to Ulnar Styloid Process 18.7 cm    Across Hand at [employer] 20.5 cm    At [place] of 2nd Digit 6.9 cm                      [facility] Adult PT Treatment/Exercise - 10/12/20 0001      Shoulder Exercises: Pulleys   Flexion 2 minutes      Shoulder Exercises: Therapy Ball   Flexion Both;10 reps   yellow ball   Other Therapy Ball Exercises 2 D ball rolls on wall   yellow x 10     Iontophoresis   Type of Iontophoresis Dexamethasone    Location Left proximal biceps    Dose 49m/ml     Time 4 hr wear time      Manual Therapy   Passive ROM Left shoulder flex, scaption, IR and ER                       PT Long Term Goals - 10/12/20 1136      PT LONG TERM GOAL #1   Title Patient will demonstrate she has regained full shoulder ROM and function post operatively compared to baseline measurements.    Time 4    Period Weeks    Status Achieved      PT LONG TERM GOAL #2   Title Patient will report >/= 25% reduction in left shoulder pain while performing daily tasks.    Baseline 60-70% better    Time 4    Period Weeks    Status Achieved      PT LONG TERM GOAL #3   Title Patient will report >/= 25% improvement in her ability to sleep at night    Baseline 60% better    Time 4    Period Weeks    Status Achieved      PT LONG TERM GOAL #4   Title 155 abd, 149 flex    Time 4    Status Achieved      PT LONG TERM GOAL #5   Title Pt will be fit for compression sleeve/gauntlet and will be independent with donning/doffing and proper wear    Time 4    Period Weeks    Status New    Target Date 11/09/20                 Plan - 10/12/20 1153    Clinical Impression Statement Pt has achieved all shoulder goals established and feels she can continue with her HEP now. She does have mildly positive impingement test, and mild discomfort with IR PROM.  She is to be measured for compression sleeve/gauntlet on Monday and will return for sleeve to be assessed and to review proper donning/doffing.  She will contact [facility] the mean time should she have any further problems with her shoulder  Stability/Clinical Decision Making Stable/Uncomplicated    Clinical Decision Making Low    Rehab Potential Excellent    PT Frequency 1x / week    PT Duration 4 weeks   will only schedule 1 visit for after she recieves sleeve   PT Treatment/Interventions ADLs/Self Care Home Management;Therapeutic exercise;Patient/family education;Iontophoresis 71m/ml Dexamethasone;Manual  techniques;Joint Manipulations;Passive range of motion    PT Next Visit Plan Assess sleeve and gauntlet, educate in donning/doffing and wear time etc    PT Home Exercise Plan HEP as instruction    Consulted and Agree with Plan of Care Patient           Patient will benefit from skilled therapeutic intervention in order to improve the following deficits and impairments:  Postural dysfunction, Decreased knowledge of precautions, Impaired UE functional use, Pain, Decreased range of motion  Visit Diagnosis: Malignant neoplasm of lower-outer quadrant of left breast of female, estrogen receptor positive (HCC)  Abnormal posture  Stiffness of left shoulder, not elsewhere classified  Chronic left shoulder pain     Problem List Patient Active Problem List   Diagnosis Date Noted  . Genetic testing 08/01/2020  . Family history of breast cancer 07/26/2020  . Family history of prostate cancer 07/26/2020  . Family history of colon cancer 07/26/2020  . Malignant neoplasm of lower-outer quadrant of left breast of female, estrogen receptor positive ([place] 07/20/2020   PHYSICAL THERAPY DISCHARGE SUMMARY  Visits from Start of Care: 6  Current functional level related to goals / functional outcomes: Achieved goals.  Received sleeve and gauntlet and has been able to wear without any questions at this time.   Remaining deficits: none   Education / Equipment: Sleeve/guantlet  Plan: Patient agrees to discharge.  Patient goals were met. Patient is being discharged due to meeting the stated rehab goals.  ?????      RClaris Pong11/18/2021, 12:01 PM  [place] [address]Phone: [phone]  Fax:  [phone] Name: Lindsay EMRYMRN: [id number] of Birth: 710-24-1967 Lindsay Hampton PT 10/12/20 12:02 PM Lindsay Hampton PT 11/27/20 11:54 AM

## 2020-10-13 ENCOUNTER — Ambulatory Visit: Payer: 59

## 2020-10-25 ENCOUNTER — Inpatient Hospital Stay: Payer: 59

## 2020-10-25 ENCOUNTER — Encounter: Payer: Self-pay | Admitting: Hematology and Oncology

## 2020-10-25 ENCOUNTER — Other Ambulatory Visit: Payer: Self-pay | Admitting: Hematology and Oncology

## 2020-10-25 ENCOUNTER — Inpatient Hospital Stay: Payer: 59 | Attending: Hematology and Oncology

## 2020-10-25 ENCOUNTER — Encounter: Payer: Self-pay | Admitting: Adult Health

## 2020-10-25 ENCOUNTER — Inpatient Hospital Stay (HOSPITAL_BASED_OUTPATIENT_CLINIC_OR_DEPARTMENT_OTHER): Payer: 59 | Admitting: Adult Health

## 2020-10-25 ENCOUNTER — Other Ambulatory Visit: Payer: Self-pay

## 2020-10-25 VITALS — BP 134/80 | HR 91 | Temp 98.1°F | Resp 20 | Ht 66.0 in | Wt 222.8 lb

## 2020-10-25 DIAGNOSIS — Z5111 Encounter for antineoplastic chemotherapy: Secondary | ICD-10-CM | POA: Insufficient documentation

## 2020-10-25 DIAGNOSIS — C50512 Malignant neoplasm of lower-outer quadrant of left female breast: Secondary | ICD-10-CM

## 2020-10-25 DIAGNOSIS — Z8042 Family history of malignant neoplasm of prostate: Secondary | ICD-10-CM | POA: Insufficient documentation

## 2020-10-25 DIAGNOSIS — Z170 Estrogen receptor positive status [ER+]: Secondary | ICD-10-CM | POA: Insufficient documentation

## 2020-10-25 DIAGNOSIS — R5383 Other fatigue: Secondary | ICD-10-CM | POA: Insufficient documentation

## 2020-10-25 DIAGNOSIS — R110 Nausea: Secondary | ICD-10-CM | POA: Insufficient documentation

## 2020-10-25 DIAGNOSIS — Z800 Family history of malignant neoplasm of digestive organs: Secondary | ICD-10-CM | POA: Insufficient documentation

## 2020-10-25 DIAGNOSIS — Z803 Family history of malignant neoplasm of breast: Secondary | ICD-10-CM | POA: Diagnosis not present

## 2020-10-25 DIAGNOSIS — Z95828 Presence of other vascular implants and grafts: Secondary | ICD-10-CM

## 2020-10-25 LAB — CMP (CANCER CENTER ONLY)
ALT: 34 U/L (ref 0–44)
AST: 29 U/L (ref 15–41)
Albumin: 3.7 g/dL (ref 3.5–5.0)
Alkaline Phosphatase: 64 U/L (ref 38–126)
Anion gap: 7 (ref 5–15)
BUN: 14 mg/dL (ref 6–20)
CO2: 24 mmol/L (ref 22–32)
Calcium: 9.1 mg/dL (ref 8.9–10.3)
Chloride: 108 mmol/L (ref 98–111)
Creatinine: 0.78 mg/dL (ref 0.44–1.00)
GFR, Estimated: >60 mL/min (ref >60)
Glucose, Bld: 110 mg/dL — ABNORMAL HIGH (ref 70–99)
Potassium: 3.6 mmol/L (ref 3.5–5.1)
Sodium: 139 mmol/L (ref 135–145)
Total Bilirubin: 0.4 mg/dL (ref 0.3–1.2)
Total Protein: 6.9 g/dL (ref 6.5–8.1)

## 2020-10-25 LAB — CBC WITH DIFFERENTIAL (CANCER CENTER ONLY)
Abs Immature Granulocytes: 0.66 10*3/uL — ABNORMAL HIGH (ref 0.00–0.07)
Basophils Absolute: 0.1 10*3/uL (ref 0.0–0.1)
Basophils Relative: 1 %
Eosinophils Absolute: 0 10*3/uL (ref 0.0–0.5)
Eosinophils Relative: 0 %
HCT: 33.5 % — ABNORMAL LOW (ref 36.0–46.0)
Hemoglobin: 11.2 g/dL — ABNORMAL LOW (ref 12.0–15.0)
Immature Granulocytes: 7 %
Lymphocytes Relative: 14 %
Lymphs Abs: 1.4 10*3/uL (ref 0.7–4.0)
MCH: 31.4 pg (ref 26.0–34.0)
MCHC: 33.4 g/dL (ref 30.0–36.0)
MCV: 93.8 fL (ref 80.0–100.0)
Monocytes Absolute: 1 10*3/uL (ref 0.1–1.0)
Monocytes Relative: 10 %
Neutro Abs: 6.8 10*3/uL (ref 1.7–7.7)
Neutrophils Relative %: 68 %
Platelet Count: 213 10*3/uL (ref 150–400)
RBC: 3.57 MIL/uL — ABNORMAL LOW (ref 3.87–5.11)
RDW: 16 % — ABNORMAL HIGH (ref 11.5–15.5)
WBC Count: 10 10*3/uL (ref 4.0–10.5)
nRBC: 0.4 % — ABNORMAL HIGH (ref 0.0–0.2)

## 2020-10-25 MED ORDER — SODIUM CHLORIDE 0.9 % IV SOLN
10.0000 mg | Freq: Once | INTRAVENOUS | Status: AC
  Administered 2020-10-25: 10 mg via INTRAVENOUS
  Filled 2020-10-25: qty 10

## 2020-10-25 MED ORDER — SODIUM CHLORIDE 0.9 % IV SOLN
Freq: Once | INTRAVENOUS | Status: AC
  Filled 2020-10-25: qty 250

## 2020-10-25 MED ORDER — DOXORUBICIN HCL CHEMO IV INJECTION 2 MG/ML
60.0000 mg/m2 | Freq: Once | INTRAVENOUS | Status: AC
  Administered 2020-10-25: 130 mg via INTRAVENOUS
  Filled 2020-10-25: qty 65

## 2020-10-25 MED ORDER — SODIUM CHLORIDE 0.9% FLUSH
10.0000 mL | INTRAVENOUS | Status: DC | PRN
  Administered 2020-10-25: 10 mL
  Filled 2020-10-25: qty 10

## 2020-10-25 MED ORDER — SODIUM CHLORIDE 0.9 % IV SOLN
16.0000 mg | Freq: Once | INTRAVENOUS | Status: AC
  Administered 2020-10-25: 16 mg via INTRAVENOUS
  Filled 2020-10-25: qty 8

## 2020-10-25 MED ORDER — SODIUM CHLORIDE 0.9% FLUSH
10.0000 mL | Freq: Once | INTRAVENOUS | Status: AC
  Administered 2020-10-25: 10 mL
  Filled 2020-10-25: qty 10

## 2020-10-25 MED ORDER — SODIUM CHLORIDE 0.9 % IV SOLN
600.0000 mg/m2 | Freq: Once | INTRAVENOUS | Status: AC
  Administered 2020-10-25: 1300 mg via INTRAVENOUS
  Filled 2020-10-25: qty 65

## 2020-10-25 MED ORDER — SODIUM CHLORIDE 0.9 % IV SOLN
150.0000 mg | Freq: Once | INTRAVENOUS | Status: AC
  Administered 2020-10-25: 150 mg via INTRAVENOUS
  Filled 2020-10-25: qty 150

## 2020-10-25 MED ORDER — HEPARIN SOD (PORK) LOCK FLUSH 100 UNIT/ML IV SOLN
500.0000 [IU] | Freq: Once | INTRAVENOUS | Status: AC | PRN
  Administered 2020-10-25: 500 [IU]
  Filled 2020-10-25: qty 5

## 2020-10-25 NOTE — Progress Notes (Signed)
Met with patient at registration to introduce myself as Financial Resource Specialist and to offer available resources.  Discussed one-time $1000 [employer] grant and qualifications to assist with personal expenses while going through treatment.  Gave her my card if interested in applying and for any additional financial questions or concerns. 

## 2020-10-25 NOTE — Patient Instructions (Signed)

## 2020-10-25 NOTE — Progress Notes (Signed)
Blood return noted before and after administration of adriamycin today   Patient discharged in stable condition, ambulated with no complaints our of facility

## 2020-10-25 NOTE — Progress Notes (Signed)
[facility] Follow up:    Lindsay Hampton, [place] [address]   DIAGNOSIS: Cancer Staging Malignant neoplasm of lower-outer quadrant of left breast of female, estrogen receptor positive ([facility] [place]) Staging form: Breast, AJCC 8th Edition - Clinical stage from 07/26/2020: Stage IIA (cT2, cN1(f), cM0, G2, ER+, PR+, HER2-) - Signed by [name], MD on 07/26/2020 - Pathologic stage from 08/24/2020: Stage IB (pT2, pN1a, cM0, G2, ER+, PR+, HER2-) - Signed by [name], MD on 08/24/2020   SUMMARY OF ONCOLOGIC HISTORY: Oncology History  Malignant neoplasm of lower-outer quadrant of left breast of female, estrogen receptor positive ([facility])  07/20/2020 Initial Diagnosis   Screening mammogram detected left breast calcifications, enlarged left axillary lymph nodes, and an asymmetry in the right breast. Diagnostic mammogram showed in the left breast, a 2.4cm mass with calcifications, 3:30 position, a left axillary lymph node with cortical thickness, and in the right breast, no suspicious masses or calcifications. Left breast biopsy showed invasive and in situ mammary carcinoma in the breast and axilla, grade 2, HER-2 equivocal by IHC (2+), negative by FISH, ER+ 95%, PR+40%, Ki67 15%.    07/26/2020 Cancer Staging   Staging form: Breast, AJCC 8th Edition - Clinical stage from 07/26/2020: Stage IIA (cT2, cN1(f), cM0, G2, ER+, PR+, HER2-) - Signed by [name], MD on 07/26/2020   08/01/2020 Genetic Testing   No pathogenic variants detected in [facility] Multi-Cancer Panel.  The Multi-Cancer Panel offered by [facility] includes sequencing and/or deletion duplication testing of the following 85 genes: AIP, ALK, APC, ATM, AXIN2,BAP1,  BARD1, BLM, BMPR1A, BRCA1, BRCA2, BRIP1, CASR, CDC73, CDH1, CDK4, CDKN1B, CDKN1C, CDKN2A (p14ARF), CDKN2A (p16INK4a), CEBPA, CHEK2, CTNNA1, DICER1, DIS3L2, EGFR (c.2369C>T, p.Thr790Met variant only), EPCAM (Deletion/duplication testing only),  FH, FLCN, GATA2, GPC3, GREM1 (Promoter region deletion/duplication testing only), HOXB13 (c.251G>A, p.Gly84Glu), HRAS, KIT, MAX, MEN1, MET, MITF (c.952G>A, p.Glu318Lys variant only), MLH1, MSH2, MSH3, MSH6, MUTYH, NBN, NF1, NF2, NTHL1, PALB2, PDGFRA, PHOX2B, PMS2, POLD1, POLE, POT1, PRKAR1A, PTCH1, PTEN, RAD50, RAD51C, RAD51D, RB1, RECQL4, RET, RNF43, RUNX1, SDHAF2, SDHA (sequence changes only), SDHB, SDHC, SDHD, SMAD4, SMARCA4, SMARCB1, SMARCE1, STK11, SUFU, TERC, TERT, TMEM127, TP53, TSC1, TSC2, VHL, WRN and WT1.  The report date is August 01, 2020.    08/16/2020 Surgery   Left lumpectomy [name]): IDC, grade 2, 2.3cm, with intermediate grade DCIS, clear margins, 1/2 left axillary lymph nodes positive for carcinoma.HER-2 equivocal by IHC (2+), negative by FISH, ER+ 95%, PR+40%, Ki67 15%.    08/24/2020 Cancer Staging   Staging form: Breast, AJCC 8th Edition - Pathologic stage from 08/24/2020: Stage IB (pT2, pN1a, cM0, G2, ER+, PR+, HER2-) - Signed by [name], MD on 08/24/2020   08/30/2020 Miscellaneous   MammaPrint high risk   09/13/2020 -  Chemotherapy   The patient had dexamethasone (DECADRON) 4 MG tablet, 4 mg (100 % of original dose 4 mg), Oral,  Once, 1 of 1 cycle, Start date: 09/04/2020, End date: 09/04/2020 Dose modification: 4 mg (original dose 4 mg, Cycle 0) DOXOrubicin (ADRIAMYCIN) chemo injection 130 mg, 60 mg/m2 = 130 mg, Intravenous,  Once, 3 of 4 cycles Administration: 130 mg (09/13/2020), 130 mg (09/27/2020), 130 mg (10/11/2020) ondansetron (ZOFRAN) 16 mg in sodium chloride 0.9 % 50 mL IVPB, 16 mg (100 % of original dose 16 mg), Intravenous,  Once, 3 of 4 cycles Dose modification: 16 mg (original dose 16 mg, Cycle 1) Administration: 16 mg (09/13/2020), 16 mg (09/27/2020), 16 mg (10/11/2020) cyclophosphamide (CYTOXAN) 1,300 mg  in sodium chloride 0.9 % 250 mL chemo infusion, 600 mg/m2 = 1,300 mg, Intravenous,  Once, 3 of 4 cycles Administration: 1,300 mg (09/13/2020), 1,300 mg  (09/27/2020), 1,300 mg (10/11/2020) PACLitaxel (TAXOL) 174 mg in sodium chloride 0.9 % 250 mL chemo infusion (</= 14m/m2), 80 mg/m2 = 174 mg, Intravenous,  Once, 0 of 12 cycles fosaprepitant (EMEND) 150 mg in sodium chloride 0.9 % 145 mL IVPB, 150 mg, Intravenous,  Once, 3 of 4 cycles Administration: 150 mg (09/13/2020), 150 mg (09/27/2020), 150 mg (10/11/2020)  for chemotherapy treatment.      CURRENT THERAPY:adriamycin/cytoxan  INTERVAL HISTORY: Lindsay Hampton 53y.o. female returns for evaluation prior to receiving her fourth cycle of adriamycin and cytoxan.  She notes that after each cycle she has mild nausea that is managed by anti emetics, and she also has fatigue.  She is walking daily and notes that this has helped her fatigue tremendously.  She denies any new issues today, and a detailed ROS was otherwise non contributory.    Patient Active Problem List   Diagnosis Date Noted  . Genetic testing 08/01/2020  . Family history of breast cancer 07/26/2020  . Family history of prostate cancer 07/26/2020  . Family history of colon cancer 07/26/2020  . Malignant neoplasm of lower-outer quadrant of left breast of female, estrogen receptor positive ([place] 07/20/2020    has No Known Allergies.  MEDICAL HISTORY: Past Medical History:  Diagnosis Date  . Allergy    seasonal  . Anemia    After childbirth  . Anxiety   . Breast cancer ([place]   . Family history of breast cancer 07/26/2020  . Family history of colon cancer 07/26/2020  . Family history of prostate cancer 07/26/2020  . GERD (gastroesophageal reflux disease)   . Pneumonia    In 20s    SURGICAL HISTORY: Past Surgical History:  Procedure Laterality Date  . AXILLARY LYMPH NODE DISSECTION Left 08/16/2020   Procedure: LEFT TARGETED AXILLARY LYMPH NODE DISSECTION;  Surgeon: NAlphonsa Overall MD;  [facility]  Service: General;  Laterality: Left;  . BREAST LUMPECTOMY WITH RADIOACTIVE SEED AND AXILLARY LYMPH NODE DISSECTION Left  08/16/2020   Procedure: LEFT BREAST LUMPECTOMY WITH RADIOACTIVE SEED;  Surgeon: NAlphonsa Overall MD;  [facility]  Service: General;  Laterality: Left;  PEC BLOCK  . BREAST SURGERY Left 06/2020   breast bx   . CERVICAL BIOPSY  W/ LOOP ELECTRODE EXCISION  12/2017  . CESAREAN SECTION    . DILATION AND CURETTAGE OF UTERUS    . PORTACATH PLACEMENT  09/12/2020   Procedure: INSERTION PORT-A-CATH WITH ULTRASOUND;  Surgeon: NAlphonsa Overall MD;  [facility];  Service: General;;  . WISDOM TOOTH EXTRACTION  2020    SOCIAL HISTORY: Social History   Socioeconomic History  . Marital status: Married    Spouse name: Not on file  . Number of children: Not on file  . Years of education: Not on file  . Highest education level: Not on file  Occupational History  . Not on file  Tobacco Use  . Smoking status: Never Smoker  . Smokeless tobacco: Never Used  Vaping Use  . Vaping Use: Never used  Substance and Sexual Activity  . Alcohol use: Not Currently    Comment: not since breast cancer dx  . Drug use: No  . Sexual activity: Not Currently    Partners: Male    Birth control/protection: None  Other Topics Concern  . Not on file  Social  History Narrative  . Not on file   Social Determinants of Health   Financial Resource Strain: Low Risk   . Difficulty of Paying Living Expenses: Not hard at all  Food Insecurity: No Food Insecurity  . Worried About Charity fundraiser in the Last Year: Never true  . Ran Out of Food in the Last Year: Never true  Transportation Needs: No Transportation Needs  . Lack of Transportation (Medical): No  . Lack of Transportation (Non-Medical): No  Physical Activity:   . Days of Exercise per Week: Not on file  . Minutes of Exercise per Session: Not on file  Stress:   . Feeling of Stress : Not on file  Social Connections:   . Frequency of Communication with Friends and Family: Not on file  . Frequency of Social Gatherings with Friends and Family: Not on  file  . Attends Religious Services: Not on file  . Active Member of Clubs or Organizations: Not on file  . Attends Archivist Meetings: Not on file  . Marital Status: Not on file  Intimate Partner Violence:   . Fear of Current or Ex-Partner: Not on file  . Emotionally Abused: Not on file  . Physically Abused: Not on file  . Sexually Abused: Not on file    FAMILY HISTORY: Family History  Problem Relation Age of Onset  . Breast cancer Mother        dx 48  . Prostate cancer Father        dx late 50s/early 82s  . Colon cancer Maternal Grandmother        early 99s  . Breast cancer Cousin        dx 67  . Esophageal cancer Maternal Grandfather        dx 68s  . Cancer Maternal Aunt        maternal grandmother's sister; possible ovarian? dx 72, d. 74s-80s  . Rectal cancer Neg Hx   . Stomach cancer Neg Hx     Review of Systems  Constitutional: Positive for fatigue. Negative for appetite change, chills, fever and unexpected weight change.  HENT:   Negative for hearing loss, lump/mass and trouble swallowing.   Eyes: Negative for eye problems and icterus.  Respiratory: Negative for chest tightness, cough and shortness of breath.   Cardiovascular: Negative for chest pain, leg swelling and palpitations.  Gastrointestinal: Positive for nausea. Negative for abdominal distention, abdominal pain, constipation, diarrhea and vomiting.  Endocrine: Negative for hot flashes.  Genitourinary: Negative for difficulty urinating.   Musculoskeletal: Negative for arthralgias.  Skin: Negative for itching and rash.  Neurological: Negative for dizziness, extremity weakness, headaches and numbness.  Hematological: Negative for adenopathy. Does not bruise/bleed easily.  Psychiatric/Behavioral: Negative for depression. The patient is not nervous/anxious.       PHYSICAL EXAMINATION  ECOG PERFORMANCE STATUS: 1 - Symptomatic but completely ambulatory  Vitals:   10/25/20 1220  BP: 134/80   Pulse: 91  Resp: 20  Temp: 98.1 F (36.7 C)  SpO2: 98%    Physical Exam Constitutional:      General: She is not in acute distress.    Appearance: Normal appearance. She is not toxic-appearing.  HENT:     Head: Normocephalic and atraumatic.  Eyes:     General: No scleral icterus. Cardiovascular:     Rate and Rhythm: Normal rate and regular rhythm.     Pulses: Normal pulses.     Heart sounds: Normal heart sounds.  Pulmonary:  Effort: Pulmonary effort is normal.     Breath sounds: Normal breath sounds.  Abdominal:     General: Abdomen is flat. Bowel sounds are normal. There is no distension.     Palpations: Abdomen is soft.     Tenderness: There is no abdominal tenderness.  Musculoskeletal:        General: No swelling.     Cervical back: Neck supple.  Lymphadenopathy:     Cervical: No cervical adenopathy.  Skin:    General: Skin is warm and dry.     Findings: No rash.  Neurological:     General: No focal deficit present.     Mental Status: She is alert.  Psychiatric:        Mood and Affect: Mood normal.        Behavior: Behavior normal.     LABORATORY DATA:  CBC    Component Value Date/Time   WBC 10.0 10/25/2020 1150   WBC 8.3 09/07/2020 1028   RBC 3.57 (L) 10/25/2020 1150   HGB 11.2 (L) 10/25/2020 1150   HGB 13.1 12/15/2017 1515   HCT 33.5 (L) 10/25/2020 1150   HCT 39.2 12/15/2017 1515   PLT 213 10/25/2020 1150   PLT 360 12/15/2017 1515   MCV 93.8 10/25/2020 1150   MCV 93 12/15/2017 1515   MCH 31.4 10/25/2020 1150   MCHC 33.4 10/25/2020 1150   RDW 16.0 (H) 10/25/2020 1150   RDW 13.4 12/15/2017 1515   LYMPHSABS 1.4 10/25/2020 1150   MONOABS 1.0 10/25/2020 1150   EOSABS 0.0 10/25/2020 1150   BASOSABS 0.1 10/25/2020 1150    CMP     Component Value Date/Time   NA 139 10/25/2020 1150   NA 139 12/15/2017 1515   K 3.6 10/25/2020 1150   CL 108 10/25/2020 1150   CO2 24 10/25/2020 1150   GLUCOSE 110 (H) 10/25/2020 1150   BUN 14 10/25/2020 1150    BUN 14 12/15/2017 1515   CREATININE 0.78 10/25/2020 1150   CREATININE 0.94 12/17/2016 1248   CALCIUM 9.1 10/25/2020 1150   PROT 6.9 10/25/2020 1150   PROT 6.8 03/16/2018 1353   ALBUMIN 3.7 10/25/2020 1150   ALBUMIN 4.6 03/16/2018 1353   AST 29 10/25/2020 1150   ALT 34 10/25/2020 1150   ALKPHOS 64 10/25/2020 1150   BILITOT 0.4 10/25/2020 1150   GFRNONAA >60 10/25/2020 1150   GFRAA >60 08/14/2020 0958   GFRAA >60 07/26/2020 0801          ASSESSMENT and THERAPY PLAN:   Malignant neoplasm of lower-outer quadrant of left breast of female, estrogen receptor positive ([facility]) 08/16/2020:Left lumpectomy [name]): IDC, grade 2, 2.3cm, with intermediate grade DCIS, clear margins, 1/2 left axillary lymph nodes positive for carcinoma.HER-2 equivocal by IHC (2+), negative by FISH, ER+ 95%, PR+40%, Ki67 15%. T2 N1 M0 stage Ib MammaPrint: High risk (average 10-year risk untreated: 29%, predicted benefit of chemo with hormone therapy at 5 years: 94.6%  Treatment plan: 1.Adjuvant chemotherapy withdose dense Adriamycin and Cytoxan x4 followed by Taxol x12 2.Followed by adjuvant radiation 3.Followed by adjuvant antiestrogen therapy ------------------------------------------------------------------------------------------------------------------------------------ Current treatment: Cycle 4dose Adriamycin and Cytoxan Echocardiogram 09/08/2020: EF 60 to 65%  Chemo toxicities: Mild nausea managed with antiemetics Mild fatigue managed with activity I reviewed her next chemotherapy treatment plan with weekly Taxol I reviewed that she will need adjuvant radiation about 3-4 weeks after Taxol is completed and this will take about 4-6 weeks which is determined by her radiation oncologist.    Return to  clinic in 2 weeks to start weekly Taxol.    She knows to call for any questions that may arise between now and her next appointment.  We are happy to see her sooner if needed.   Total  encounter time: 20 minutes*  Wilber Bihari, NP 10/25/20 1:28 PM Medical Oncology and Hematology [facility] [place], [place] [address] Tel. [phone]    Fax. [phone]  *Total Encounter Time as defined by the Centers for Medicare and Medicaid Services includes, in addition to the face-to-face time of a patient visit (documented in the note above) non-face-to-face time: obtaining and reviewing outside history, ordering and reviewing medications, tests or procedures, care coordination (communications with other health care professionals or caregivers) and documentation in the medical record.

## 2020-10-25 NOTE — Patient Instructions (Signed)
[facility] Discharge Instructions for Patients Receiving Chemotherapy  Today you received the following chemotherapy agents: Doxorubicin and Cytoxan  To help prevent nausea and vomiting after your treatment, we encourage you to take your nausea medication  as prescribed.    If you develop nausea and vomiting that is not controlled by your nausea medication, call the clinic.   BELOW ARE SYMPTOMS THAT SHOULD BE REPORTED IMMEDIATELY:  *FEVER GREATER THAN 100.5 F  *CHILLS WITH OR WITHOUT FEVER  NAUSEA AND VOMITING THAT IS NOT CONTROLLED WITH YOUR NAUSEA MEDICATION  *UNUSUAL SHORTNESS OF BREATH  *UNUSUAL BRUISING OR BLEEDING  TENDERNESS IN MOUTH AND THROAT WITH OR WITHOUT PRESENCE OF ULCERS  *URINARY PROBLEMS  *BOWEL PROBLEMS  UNUSUAL RASH Items with * indicate a potential emergency and should be followed up as soon as possible.  Feel free to call the clinic should you have any questions or concerns. The clinic phone number is [phone].  Please show the [facility] at check-in to the Emergency Department and triage nurse.

## 2020-10-25 NOTE — Assessment & Plan Note (Signed)
08/16/2020:Left lumpectomy Lindsay Hampton): IDC, grade 2, 2.3cm, with intermediate grade DCIS, clear margins, 1/2 left axillary lymph nodes positive for carcinoma.HER-2 equivocal by IHC (2+), negative by FISH, ER+ 95%, PR+40%, Ki67 15%. T2 N1 M0 stage Ib MammaPrint: High risk (average 10-year risk untreated: 29%, predicted benefit of chemo with hormone therapy at 5 years: 94.6%  Treatment plan: 1.Adjuvant chemotherapy withdose dense Adriamycin and Cytoxan x4 followed by Taxol x12 2.Followed by adjuvant radiation 3.Followed by adjuvant antiestrogen therapy ------------------------------------------------------------------------------------------------------------------------------------ Current treatment: Cycle 4dose Adriamycin and Cytoxan Echocardiogram 09/08/2020: EF 60 to 65%  Chemo toxicities: Mild nausea managed with antiemetics Mild fatigue managed with activity I reviewed her next chemotherapy treatment plan with weekly Taxol I reviewed that she will need adjuvant radiation about 3-4 weeks after Taxol is completed and this will take about 4-6 weeks which is determined by her radiation oncologist.    Return to clinic in 2 weeks to start weekly Taxol.

## 2020-10-27 ENCOUNTER — Ambulatory Visit: Payer: 59

## 2020-11-03 MED FILL — Dexamethasone Sodium Phosphate Inj 100 MG/10ML: INTRAMUSCULAR | Qty: 2 | Status: AC

## 2020-11-05 NOTE — Progress Notes (Signed)
Patient Care Team: [name], [place] as PCP - General (Internal Medicine) [name], RN as Oncology Nurse Navigator [name], RN as Oncology Nurse Navigator [name], MD as Consulting Physician (General Surgery) [name], MD as Consulting Physician (Hematology and Oncology) Kyung Rudd, MD as Consulting Physician (Radiation Oncology)  DIAGNOSIS:    ICD-10-CM   1. Malignant neoplasm of lower-outer quadrant of left breast of female, estrogen receptor positive ([place])  C50.512    Z17.0     SUMMARY OF ONCOLOGIC HISTORY: Oncology History  Malignant neoplasm of lower-outer quadrant of left breast of female, estrogen receptor positive ([place])  07/20/2020 Initial Diagnosis   Screening mammogram detected left breast calcifications, enlarged left axillary lymph nodes, and an asymmetry in the right breast. Diagnostic mammogram showed in the left breast, a 2.4cm mass with calcifications, 3:30 position, a left axillary lymph node with cortical thickness, and in the right breast, no suspicious masses or calcifications. Left breast biopsy showed invasive and in situ mammary carcinoma in the breast and axilla, grade 2, HER-2 equivocal by IHC (2+), negative by FISH, ER+ 95%, PR+40%, Ki67 15%.    07/26/2020 Cancer Staging   Staging form: Breast, AJCC 8th Edition - Clinical stage from 07/26/2020: Stage IIA (cT2, cN1(f), cM0, G2, ER+, PR+, HER2-) - Signed by [name], MD on 07/26/2020   08/01/2020 Genetic Testing   No pathogenic variants detected in [facility] Multi-Cancer Panel.  The Multi-Cancer Panel offered by [facility] includes sequencing and/or deletion duplication testing of the following 85 genes: AIP, ALK, APC, ATM, AXIN2,BAP1,  BARD1, BLM, BMPR1A, BRCA1, BRCA2, BRIP1, CASR, CDC73, CDH1, CDK4, CDKN1B, CDKN1C, CDKN2A (p14ARF), CDKN2A (p16INK4a), CEBPA, CHEK2, CTNNA1, DICER1, DIS3L2, EGFR (c.2369C>T, p.Thr790Met variant only), EPCAM (Deletion/duplication testing only), FH, FLCN,  GATA2, GPC3, GREM1 (Promoter region deletion/duplication testing only), HOXB13 (c.251G>A, p.Gly84Glu), HRAS, KIT, MAX, MEN1, MET, MITF (c.952G>A, p.Glu318Lys variant only), MLH1, MSH2, MSH3, MSH6, MUTYH, NBN, NF1, NF2, NTHL1, PALB2, PDGFRA, PHOX2B, PMS2, POLD1, POLE, POT1, PRKAR1A, PTCH1, PTEN, RAD50, RAD51C, RAD51D, RB1, RECQL4, RET, RNF43, RUNX1, SDHAF2, SDHA (sequence changes only), SDHB, SDHC, SDHD, SMAD4, SMARCA4, SMARCB1, SMARCE1, STK11, SUFU, TERC, TERT, TMEM127, TP53, TSC1, TSC2, VHL, WRN and WT1.  The report date is August 01, 2020.    08/16/2020 Surgery   Left lumpectomy [name]): IDC, grade 2, 2.3cm, with intermediate grade DCIS, clear margins, 1/2 left axillary lymph nodes positive for carcinoma.HER-2 equivocal by IHC (2+), negative by FISH, ER+ 95%, PR+40%, Ki67 15%.    08/24/2020 Cancer Staging   Staging form: Breast, AJCC 8th Edition - Pathologic stage from 08/24/2020: Stage IB (pT2, pN1a, cM0, G2, ER+, PR+, HER2-) - Signed by [name], MD on 08/24/2020   08/30/2020 Miscellaneous   MammaPrint high risk   09/13/2020 -  Chemotherapy   The patient had dexamethasone (DECADRON) 4 MG tablet, 4 mg (100 % of original dose 4 mg), Oral,  Once, 1 of 1 cycle, Start date: 09/04/2020, End date: 09/04/2020 Dose modification: 4 mg (original dose 4 mg, Cycle 0) DOXOrubicin (ADRIAMYCIN) chemo injection 130 mg, 60 mg/m2 = 130 mg, Intravenous,  Once, 4 of 4 cycles Administration: 130 mg (09/13/2020), 130 mg (09/27/2020), 130 mg (10/11/2020), 130 mg (10/25/2020) ondansetron (ZOFRAN) 16 mg in sodium chloride 0.9 % 50 mL IVPB, 16 mg (100 % of original dose 16 mg), Intravenous,  Once, 4 of 4 cycles Dose modification: 16 mg (original dose 16 mg, Cycle 1) Administration: 16 mg (09/13/2020), 16 mg (09/27/2020), 16 mg (10/11/2020), 16 mg (10/25/2020) cyclophosphamide (CYTOXAN)  1,300 mg in sodium chloride 0.9 % 250 mL chemo infusion, 600 mg/m2 = 1,300 mg, Intravenous,  Once, 4 of 4 cycles Administration: 1,300  mg (09/13/2020), 1,300 mg (09/27/2020), 1,300 mg (10/11/2020), 1,300 mg (10/25/2020) PACLitaxel (TAXOL) 174 mg in sodium chloride 0.9 % 250 mL chemo infusion (</= 76m/m2), 80 mg/m2 = 174 mg, Intravenous,  Once, 1 of 12 cycles fosaprepitant (EMEND) 150 mg in sodium chloride 0.9 % 145 mL IVPB, 150 mg, Intravenous,  Once, 4 of 4 cycles Administration: 150 mg (09/13/2020), 150 mg (09/27/2020), 150 mg (10/11/2020), 150 mg (10/25/2020)  for chemotherapy treatment.      CHIEF COMPLIANT: Cycle1 Taxol  INTERVAL HISTORY: Lindsay JEANBAPTISTEis a 54y.o. with above-mentioned history of  left breast cancer treated with lumpectomyand who is currently on adjuvant chemotherapy withweekly Taxol after completing 4 cycles of dose dense Adriamycin and Cytoxan.She presents to the clinic todayfora toxicity checkandcycle1.    She experienced more fatigue after this past cycle of chemotherapy.  ALLERGIES:  has No Known Allergies.  MEDICATIONS:  Current Outpatient Medications  Medication Sig Dispense Refill  . ALPRAZolam (XANAX) 0.25 MG tablet Take 1 tablet (0.25 mg total) by mouth 2 (two) times daily as needed for anxiety. 60 tablet 3  . dexamethasone (DECADRON) 4 MG tablet Take 4 mg by mouth daily.    .Marland Kitchenlidocaine-prilocaine (EMLA) cream Apply to affected area once (Patient taking differently: Apply 1 application topically as needed. Apply to affected area once) 30 g 3  . metroNIDAZOLE (METROCREAM) 0.75 % cream Apply 1 application topically once a week. Apply to face daily    . ondansetron (ZOFRAN) 8 MG tablet Take 1 tablet (8 mg total) by mouth 2 (two) times daily as needed. Start on the third day after chemotherapy. 30 tablet 1  . prochlorperazine (COMPAZINE) 10 MG tablet Take 1 tablet (10 mg total) by mouth every 6 (six) hours as needed (Nausea or vomiting). 30 tablet 1  . ZIEXTENZO 6 MG/0.6ML injection INJECT 0.6 ML (6 MG) UNDER THE SKIN 48 HOURS AFTER COMPLETION OF CHEMOTHERAPY EVERY 2 WEEKS. TREATMENT CYCLE  STARTS 09/15/2020. 0.6 mL 0   No current facility-administered medications for this visit.    PHYSICAL EXAMINATION: ECOG PERFORMANCE STATUS: 1 - Symptomatic but completely ambulatory  Vitals:   11/06/20 1336  BP: 131/90  Pulse: 95  Resp: 20  Temp: 98 F (36.7 C)  SpO2: 100%   Filed Weights   11/06/20 1336  Weight: 224 lb 14.4 oz (102 kg)    LABORATORY DATA:  I have reviewed the data as listed CMP Latest Ref Rng & Units 10/25/2020 10/11/2020 09/27/2020  Glucose 70 - 99 mg/dL 110(H) 124(H) 115(H)  BUN 6 - 20 mg/dL 14 16 12   Creatinine 0.44 - 1.00 mg/dL 0.78 0.78 0.75  Sodium 135 - 145 mmol/L 139 140 139  Potassium 3.5 - 5.1 mmol/L 3.6 3.9 4.2  Chloride 98 - 111 mmol/L 108 107 108  CO2 22 - 32 mmol/L 24 24 22   Calcium 8.9 - 10.3 mg/dL 9.1 9.1 9.3  Total Protein 6.5 - 8.1 g/dL 6.9 6.8 7.1  Total Bilirubin 0.3 - 1.2 mg/dL 0.4 0.4 0.3  Alkaline Phos 38 - 126 U/L 64 52 48  AST 15 - 41 U/L 29 28 29   ALT 0 - 44 U/L 34 34 46(H)    Lab Results  Component Value Date   WBC 8.9 11/06/2020   HGB 10.6 (L) 11/06/2020   HCT 31.1 (L) 11/06/2020   MCV 93.7  11/06/2020   PLT 191 11/06/2020   NEUTROABS PENDING 11/06/2020    ASSESSMENT & PLAN:  Malignant neoplasm of lower-outer quadrant of left breast of female, estrogen receptor positive ([place]) 08/16/2020:Left lumpectomy [name]): IDC, grade 2, 2.3cm, with intermediate grade DCIS, clear margins, 1/2 left axillary lymph nodes positive for carcinoma.HER-2 equivocal by IHC (2+), negative by FISH, ER+ 95%, PR+40%, Ki67 15%. T2 N1 M0 stage Ib MammaPrint: High risk (average 10-year risk untreated: 29%, predicted benefit of chemo with hormone therapy at 5 years: 94.6%  Treatment plan: 1.Adjuvant chemotherapy withdose dense Adriamycin and Cytoxan x4 followed by Taxol x12 2.Followed by adjuvant radiation 3.Followed by adjuvant antiestrogen  therapy ------------------------------------------------------------------------------------------------------------------------------------ Current treatment: Completed 4 cycles ofdose Adriamycin and Cytoxan, today cycle 1 Taxol Echocardiogram 09/08/2020: EF 60 to 65%  Chemo toxicities: Mild nausea managed with antiemetics Mild fatigue managed with activity   Patient will use eyes to prevent peripheral neuropathy. Return to clinic in 1 week for cycle 2 Taxol    No orders of the defined types were placed in this encounter.  The patient has a good understanding of the overall plan. she agrees with it. she will call with any problems that may develop before the next visit here.  Total time spent: 30 mins including face to face time and time spent for planning, charting and coordination of care  [name], MD 11/06/2020  I, Cloyde Reams Dorshimer, am acting as scribe for Dr. [name].  I have reviewed the above documentation for accuracy and completeness, and I agree with the above.

## 2020-11-06 ENCOUNTER — Inpatient Hospital Stay: Payer: 59

## 2020-11-06 ENCOUNTER — Inpatient Hospital Stay (HOSPITAL_BASED_OUTPATIENT_CLINIC_OR_DEPARTMENT_OTHER): Payer: 59 | Admitting: Hematology and Oncology

## 2020-11-06 ENCOUNTER — Other Ambulatory Visit: Payer: Self-pay

## 2020-11-06 VITALS — BP 143/92 | HR 87 | Temp 98.3°F | Resp 18

## 2020-11-06 DIAGNOSIS — Z170 Estrogen receptor positive status [ER+]: Secondary | ICD-10-CM

## 2020-11-06 DIAGNOSIS — C50512 Malignant neoplasm of lower-outer quadrant of left female breast: Secondary | ICD-10-CM | POA: Diagnosis not present

## 2020-11-06 DIAGNOSIS — Z95828 Presence of other vascular implants and grafts: Secondary | ICD-10-CM

## 2020-11-06 LAB — CBC WITH DIFFERENTIAL (CANCER CENTER ONLY)
Abs Immature Granulocytes: 0.7 10*3/uL — ABNORMAL HIGH (ref 0.00–0.07)
Basophils Absolute: 0.1 10*3/uL (ref 0.0–0.1)
Basophils Relative: 1 %
Eosinophils Absolute: 0 10*3/uL (ref 0.0–0.5)
Eosinophils Relative: 0 %
HCT: 31.1 % — ABNORMAL LOW (ref 36.0–46.0)
Hemoglobin: 10.6 g/dL — ABNORMAL LOW (ref 12.0–15.0)
Immature Granulocytes: 8 %
Lymphocytes Relative: 16 %
Lymphs Abs: 1.5 10*3/uL (ref 0.7–4.0)
MCH: 31.9 pg (ref 26.0–34.0)
MCHC: 34.1 g/dL (ref 30.0–36.0)
MCV: 93.7 fL (ref 80.0–100.0)
Monocytes Absolute: 1 10*3/uL (ref 0.1–1.0)
Monocytes Relative: 11 %
Neutro Abs: 5.7 10*3/uL (ref 1.7–7.7)
Neutrophils Relative %: 64 %
Platelet Count: 191 10*3/uL (ref 150–400)
RBC: 3.32 MIL/uL — ABNORMAL LOW (ref 3.87–5.11)
RDW: 16.9 % — ABNORMAL HIGH (ref 11.5–15.5)
WBC Count: 8.9 10*3/uL (ref 4.0–10.5)
nRBC: 1.1 % — ABNORMAL HIGH (ref 0.0–0.2)

## 2020-11-06 LAB — CMP (CANCER CENTER ONLY)
ALT: 25 U/L (ref 0–44)
AST: 22 U/L (ref 15–41)
Albumin: 3.8 g/dL (ref 3.5–5.0)
Alkaline Phosphatase: 66 U/L (ref 38–126)
Anion gap: 5 (ref 5–15)
BUN: 15 mg/dL (ref 6–20)
CO2: 26 mmol/L (ref 22–32)
Calcium: 9.6 mg/dL (ref 8.9–10.3)
Chloride: 108 mmol/L (ref 98–111)
Creatinine: 0.74 mg/dL (ref 0.44–1.00)
GFR, Estimated: >60 mL/min (ref >60)
Glucose, Bld: 95 mg/dL (ref 70–99)
Potassium: 3.7 mmol/L (ref 3.5–5.1)
Sodium: 139 mmol/L (ref 135–145)
Total Bilirubin: 0.5 mg/dL (ref 0.3–1.2)
Total Protein: 6.9 g/dL (ref 6.5–8.1)

## 2020-11-06 MED ORDER — SODIUM CHLORIDE 0.9% FLUSH
10.0000 mL | INTRAVENOUS | Status: DC | PRN
  Filled 2020-11-06: qty 10

## 2020-11-06 MED ORDER — DIPHENHYDRAMINE HCL 50 MG/ML IJ SOLN
INTRAMUSCULAR | Status: AC
  Filled 2020-11-06: qty 1

## 2020-11-06 MED ORDER — FAMOTIDINE IN NACL 20-0.9 MG/50ML-% IV SOLN
20.0000 mg | Freq: Once | INTRAVENOUS | Status: AC
Start: 2020-11-06 — End: 2020-11-06
  Administered 2020-11-06: 20 mg via INTRAVENOUS

## 2020-11-06 MED ORDER — DIPHENHYDRAMINE HCL 50 MG/ML IJ SOLN
25.0000 mg | Freq: Once | INTRAMUSCULAR | Status: AC
Start: 2020-11-06 — End: 2020-11-06
  Administered 2020-11-06: 25 mg via INTRAVENOUS

## 2020-11-06 MED ORDER — SODIUM CHLORIDE 0.9 % IV SOLN
10.0000 mg | Freq: Once | INTRAVENOUS | Status: AC
  Administered 2020-11-06: 10 mg via INTRAVENOUS
  Filled 2020-11-06: qty 10

## 2020-11-06 MED ORDER — FAMOTIDINE IN NACL 20-0.9 MG/50ML-% IV SOLN
INTRAVENOUS | Status: AC
  Filled 2020-11-06: qty 50

## 2020-11-06 MED ORDER — ONDANSETRON HCL 4 MG/2ML IJ SOLN
INTRAMUSCULAR | Status: AC
  Filled 2020-11-06: qty 4

## 2020-11-06 MED ORDER — HEPARIN SOD (PORK) LOCK FLUSH 100 UNIT/ML IV SOLN
500.0000 [IU] | Freq: Once | INTRAVENOUS | Status: DC | PRN
  Filled 2020-11-06: qty 5

## 2020-11-06 MED ORDER — PACLITAXEL CHEMO INJECTION 300 MG/50ML
80.0000 mg/m2 | Freq: Once | INTRAVENOUS | Status: AC
  Administered 2020-11-06: 174 mg via INTRAVENOUS
  Filled 2020-11-06: qty 29

## 2020-11-06 MED ORDER — ONDANSETRON HCL 4 MG/2ML IJ SOLN
8.0000 mg | Freq: Once | INTRAMUSCULAR | Status: AC
  Administered 2020-11-06: 8 mg via INTRAVENOUS

## 2020-11-06 MED ORDER — SODIUM CHLORIDE 0.9 % IV SOLN
Freq: Once | INTRAVENOUS | Status: AC
  Filled 2020-11-06: qty 250

## 2020-11-06 NOTE — Progress Notes (Signed)
Pt discharged in no apparent distress. Pt left ambulatory without assistance. Pt aware of discharge instructions and verbalized understanding and had no further questions.  

## 2020-11-06 NOTE — Patient Instructions (Addendum)
[facility] Discharge Instructions for Patients Receiving Chemotherapy  Today you received the following chemotherapy agents Paclitaxel   To help prevent nausea and vomiting after your treatment, we encourage you to take your nausea medication as prescribed by your physician.    If you develop nausea and vomiting that is not controlled by your nausea medication, call the clinic.   BELOW ARE SYMPTOMS THAT SHOULD BE REPORTED IMMEDIATELY:  *FEVER GREATER THAN 100.5 F  *CHILLS WITH OR WITHOUT FEVER  NAUSEA AND VOMITING THAT IS NOT CONTROLLED WITH YOUR NAUSEA MEDICATION  *UNUSUAL SHORTNESS OF BREATH  *UNUSUAL BRUISING OR BLEEDING  TENDERNESS IN MOUTH AND THROAT WITH OR WITHOUT PRESENCE OF ULCERS  *URINARY PROBLEMS  *BOWEL PROBLEMS  UNUSUAL RASH Items with * indicate a potential emergency and should be followed up as soon as possible.  Feel free to call the clinic should you have any questions or concerns. The clinic phone number is [phone].  Please show the [facility] at check-in to the Emergency Department and triage nurse.   Paclitaxel injection What is this medicine? PACLITAXEL (PAK li TAX el) is a chemotherapy drug. It targets fast dividing cells, like cancer cells, and causes these cells to die. This medicine is used to treat ovarian cancer, breast cancer, lung cancer, Kaposi's sarcoma, and other cancers. This medicine may be used for other purposes; ask your health care provider or pharmacist if you have questions. COMMON BRAND NAME(S): Onxol, Taxol What should I tell my health care provider before I take this medicine? They need to know if you have any of these conditions:  history of irregular heartbeat  liver disease  low blood counts, like low white cell, platelet, or red cell counts  lung or breathing disease, like asthma  tingling of the fingers or toes, or other nerve disorder  an unusual or allergic reaction to paclitaxel,  alcohol, polyoxyethylated castor oil, other chemotherapy, other medicines, foods, dyes, or preservatives  pregnant or trying to get pregnant  breast-feeding How should I use this medicine? This drug is given as an infusion into a vein. It is administered in a hospital or clinic by a specially trained health care professional. Talk to your pediatrician regarding the use of this medicine in children. Special care may be needed. Overdosage: If you think you have taken too much of this medicine contact a poison control center or emergency room at once. NOTE: This medicine is only for you. Do not share this medicine with others. What if I miss a dose? It is important not to miss your dose. Call your doctor or health care professional if you are unable to keep an appointment. What may interact with this medicine? Do not take this medicine with any of the following medications:  disulfiram  metronidazole This medicine may also interact with the following medications:  antiviral medicines for hepatitis, HIV or AIDS  certain antibiotics like erythromycin and clarithromycin  certain medicines for fungal infections like ketoconazole and itraconazole  certain medicines for seizures like carbamazepine, phenobarbital, phenytoin  gemfibrozil  nefazodone  rifampin  St. John's wort This list may not describe all possible interactions. Give your health care provider a list of all the medicines, herbs, non-prescription drugs, or dietary supplements you use. Also tell them if you smoke, drink alcohol, or use illegal drugs. Some items may interact with your medicine. What should I watch for while using this medicine? Your condition will be monitored carefully while you are receiving this medicine.  You will need important blood work done while you are taking this medicine. This medicine can cause serious allergic reactions. To reduce your risk you will need to take other medicine(s) before treatment  with this medicine. If you experience allergic reactions like skin rash, itching or hives, swelling of the face, lips, or tongue, tell your doctor or health care professional right away. In some cases, you may be given additional medicines to help with side effects. Follow all directions for their use. This drug may make you feel generally unwell. This is not uncommon, as chemotherapy can affect healthy cells as well as cancer cells. Report any side effects. Continue your course of treatment even though you feel ill unless your doctor tells you to stop. Call your doctor or health care professional for advice if you get a fever, chills or sore throat, or other symptoms of a cold or flu. Do not treat yourself. This drug decreases your body's ability to fight infections. Try to avoid being around people who are sick. This medicine may increase your risk to bruise or bleed. Call your doctor or health care professional if you notice any unusual bleeding. Be careful brushing and flossing your teeth or using a toothpick because you may get an infection or bleed more easily. If you have any dental work done, tell your dentist you are receiving this medicine. Avoid taking products that contain aspirin, acetaminophen, ibuprofen, naproxen, or ketoprofen unless instructed by your doctor. These medicines may hide a fever. Do not become pregnant while taking this medicine. Women should inform their doctor if they wish to become pregnant or think they might be pregnant. There is a potential for serious side effects to an unborn child. Talk to your health care professional or pharmacist for more information. Do not breast-feed an infant while taking this medicine. Men are advised not to father a child while receiving this medicine. This product may contain alcohol. Ask your pharmacist or healthcare provider if this medicine contains alcohol. Be sure to tell all healthcare providers you are taking this medicine. Certain  medicines, like metronidazole and disulfiram, can cause an unpleasant reaction when taken with alcohol. The reaction includes flushing, headache, nausea, vomiting, sweating, and increased thirst. The reaction can last from 30 minutes to several hours. What side effects may I notice from receiving this medicine? Side effects that you should report to your doctor or health care professional as soon as possible:  allergic reactions like skin rash, itching or hives, swelling of the face, lips, or tongue  breathing problems  changes in vision  fast, irregular heartbeat  high or low blood pressure  mouth sores  pain, tingling, numbness in the hands or feet  signs of decreased platelets or bleeding - bruising, pinpoint red spots on the skin, black, tarry stools, blood in the urine  signs of decreased red blood cells - unusually weak or tired, feeling faint or lightheaded, falls  signs of infection - fever or chills, cough, sore throat, pain or difficulty passing urine  signs and symptoms of liver injury like dark yellow or brown urine; general ill feeling or flu-like symptoms; light-colored stools; loss of appetite; nausea; right upper belly pain; unusually weak or tired; yellowing of the eyes or skin  swelling of the ankles, feet, hands  unusually slow heartbeat Side effects that usually do not require medical attention (report to your doctor or health care professional if they continue or are bothersome):  diarrhea  hair loss  loss of appetite  muscle or joint pain  nausea, vomiting  pain, redness, or irritation at site where injected  tiredness This list may not describe all possible side effects. Call your doctor for medical advice about side effects. You may report side effects to FDA at 1-800-FDA-1088. Where should I keep my medicine? This drug is given in a hospital or clinic and will not be stored at home. NOTE: This sheet is a summary. It may not cover all possible  information. If you have questions about this medicine, talk to your doctor, pharmacist, or health care provider.  2020 Elsevier/Gold Standard (2017-07-15 13:14:55)

## 2020-11-06 NOTE — Assessment & Plan Note (Signed)
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 T2 N1 M0 stage Ib MammaPrint: High risk (average 10-year risk untreated: 29%, predicted benefit of chemo with hormone therapy at 5 years: 94.6%  Treatment plan: 1.Adjuvant chemotherapy withdose dense Adriamycin and Cytoxan x4 followed by Taxol x12 2.Followed by adjuvant radiation 3.Followed by adjuvant antiestrogen therapy ------------------------------------------------------------------------------------------------------------------------------------ Current treatment: Completed 4 cycles ofdose Adriamycin and Cytoxan, today cycle 1 Taxol Echocardiogram 09/08/2020: EF 60 to 65%  Chemo toxicities: Mild nausea managed with antiemetics Mild fatigue managed with activity   Return to clinic in 1 week for cycle 2 Taxol

## 2020-11-07 ENCOUNTER — Telehealth: Payer: Self-pay | Admitting: *Deleted

## 2020-11-07 NOTE — Telephone Encounter (Signed)
-----   Message from Teodoro Spray, RN sent at 11/07/2020  7:41 AM EST ----- Regarding: [place] first time chemo followup Dr. Lindi Adie patient first time taxol on 11/06/20. Patient tolerated treatment well. Thank you

## 2020-11-07 NOTE — Telephone Encounter (Signed)
Called pt to see how she did with her treatment yest.  She reports doing well, no problems with administration of chemo & slept good, no nausea, good appetitie, no questions. She reports that she knows how to reach [place] if needed.

## 2020-11-08 ENCOUNTER — Other Ambulatory Visit: Payer: 59

## 2020-11-08 ENCOUNTER — Ambulatory Visit: Payer: 59 | Admitting: Hematology and Oncology

## 2020-11-08 ENCOUNTER — Ambulatory Visit: Payer: 59

## 2020-11-12 NOTE — Progress Notes (Signed)
Patient Care Team: [name], [place] as PCP - General (Internal Medicine) [name], RN as Oncology Nurse Navigator [name], RN as Oncology Nurse Navigator [name], MD as Consulting Physician (General Surgery) [name], MD as Consulting Physician (Hematology and Oncology) Kyung Rudd, MD as Consulting Physician (Radiation Oncology)  DIAGNOSIS:    ICD-10-CM   1. Malignant neoplasm of lower-outer quadrant of left breast of female, estrogen receptor positive ([place])  C50.512    Z17.0     SUMMARY OF ONCOLOGIC HISTORY: Oncology History  Malignant neoplasm of lower-outer quadrant of left breast of female, estrogen receptor positive ([place])  07/20/2020 Initial Diagnosis   Screening mammogram detected left breast calcifications, enlarged left axillary lymph nodes, and an asymmetry in the right breast. Diagnostic mammogram showed in the left breast, a 2.4cm mass with calcifications, 3:30 position, a left axillary lymph node with cortical thickness, and in the right breast, no suspicious masses or calcifications. Left breast biopsy showed invasive and in situ mammary carcinoma in the breast and axilla, grade 2, HER-2 equivocal by IHC (2+), negative by FISH, ER+ 95%, PR+40%, Ki67 15%.    07/26/2020 Cancer Staging   Staging form: Breast, AJCC 8th Edition - Clinical stage from 07/26/2020: Stage IIA (cT2, cN1(f), cM0, G2, ER+, PR+, HER2-) - Signed by [name], MD on 07/26/2020   08/01/2020 Genetic Testing   No pathogenic variants detected in [facility] Multi-Cancer Panel.  The Multi-Cancer Panel offered by [facility] includes sequencing and/or deletion duplication testing of the following 85 genes: AIP, ALK, APC, ATM, AXIN2,BAP1,  BARD1, BLM, BMPR1A, BRCA1, BRCA2, BRIP1, CASR, CDC73, CDH1, CDK4, CDKN1B, CDKN1C, CDKN2A (p14ARF), CDKN2A (p16INK4a), CEBPA, CHEK2, CTNNA1, DICER1, DIS3L2, EGFR (c.2369C>T, p.Thr790Met variant only), EPCAM (Deletion/duplication testing only), FH, FLCN,  GATA2, GPC3, GREM1 (Promoter region deletion/duplication testing only), HOXB13 (c.251G>A, p.Gly84Glu), HRAS, KIT, MAX, MEN1, MET, MITF (c.952G>A, p.Glu318Lys variant only), MLH1, MSH2, MSH3, MSH6, MUTYH, NBN, NF1, NF2, NTHL1, PALB2, PDGFRA, PHOX2B, PMS2, POLD1, POLE, POT1, PRKAR1A, PTCH1, PTEN, RAD50, RAD51C, RAD51D, RB1, RECQL4, RET, RNF43, RUNX1, SDHAF2, SDHA (sequence changes only), SDHB, SDHC, SDHD, SMAD4, SMARCA4, SMARCB1, SMARCE1, STK11, SUFU, TERC, TERT, TMEM127, TP53, TSC1, TSC2, VHL, WRN and WT1.  The report date is August 01, 2020.    08/16/2020 Surgery   Left lumpectomy [name]): IDC, grade 2, 2.3cm, with intermediate grade DCIS, clear margins, 1/2 left axillary lymph nodes positive for carcinoma.HER-2 equivocal by IHC (2+), negative by FISH, ER+ 95%, PR+40%, Ki67 15%.    08/24/2020 Cancer Staging   Staging form: Breast, AJCC 8th Edition - Pathologic stage from 08/24/2020: Stage IB (pT2, pN1a, cM0, G2, ER+, PR+, HER2-) - Signed by [name], MD on 08/24/2020   08/30/2020 Miscellaneous   MammaPrint high risk   09/13/2020 -  Chemotherapy   The patient had dexamethasone (DECADRON) 4 MG tablet, 4 mg (100 % of original dose 4 mg), Oral,  Once, 1 of 1 cycle, Start date: 09/04/2020, End date: 09/04/2020 Dose modification: 4 mg (original dose 4 mg, Cycle 0) DOXOrubicin (ADRIAMYCIN) chemo injection 130 mg, 60 mg/m2 = 130 mg, Intravenous,  Once, 4 of 4 cycles Administration: 130 mg (09/13/2020), 130 mg (09/27/2020), 130 mg (10/11/2020), 130 mg (10/25/2020) ondansetron (ZOFRAN) 16 mg in sodium chloride 0.9 % 50 mL IVPB, 16 mg (100 % of original dose 16 mg), Intravenous,  Once, 5 of 16 cycles Dose modification: 16 mg (original dose 16 mg, Cycle 1), 8 mg (original dose 8 mg, Cycle 5) Administration: 16 mg (09/13/2020), 16 mg (09/27/2020),  16 mg (10/11/2020), 16 mg (10/25/2020), 8 mg (11/06/2020) cyclophosphamide (CYTOXAN) 1,300 mg in sodium chloride 0.9 % 250 mL chemo infusion, 600 mg/m2 = 1,300 mg,  Intravenous,  Once, 4 of 4 cycles Administration: 1,300 mg (09/13/2020), 1,300 mg (09/27/2020), 1,300 mg (10/11/2020), 1,300 mg (10/25/2020) PACLitaxel (TAXOL) 174 mg in sodium chloride 0.9 % 250 mL chemo infusion (</= 68m/m2), 80 mg/m2 = 174 mg, Intravenous,  Once, 1 of 12 cycles Administration: 174 mg (11/06/2020) fosaprepitant (EMEND) 150 mg in sodium chloride 0.9 % 145 mL IVPB, 150 mg, Intravenous,  Once, 4 of 4 cycles Administration: 150 mg (09/13/2020), 150 mg (09/27/2020), 150 mg (10/11/2020), 150 mg (10/25/2020)  for chemotherapy treatment.      CHIEF COMPLIANT: Cycle2 Taxol  INTERVAL HISTORY: Lindsay OBERis a 54y.o. with above-mentioned history of left breast cancer treated with lumpectomyand who is currently on adjuvant chemotherapy withweekly Taxol after completing 4 cycles of dose dense Adriamycin and Cytoxan.She presents to the clinic todayfora toxicity checkandcycle2.she tolerated cycle 1 Taxol extremely well without any problems or concerns.  She only took 1 tablet of Zofran but did not eat anything since then.  Denies any rashes or bowel issues.  ALLERGIES:  has No Known Allergies.  MEDICATIONS:  Current Outpatient Medications  Medication Sig Dispense Refill  . ALPRAZolam (XANAX) 0.25 MG tablet Take 1 tablet (0.25 mg total) by mouth 2 (two) times daily as needed for anxiety. 60 tablet 3  . dexamethasone (DECADRON) 4 MG tablet Take 4 mg by mouth daily.    .Marland Kitchenlidocaine-prilocaine (EMLA) cream Apply to affected area once (Patient taking differently: Apply 1 application topically as needed. Apply to affected area once) 30 g 3  . metroNIDAZOLE (METROCREAM) 0.75 % cream Apply 1 application topically once a week. Apply to face daily    . ondansetron (ZOFRAN) 8 MG tablet Take 1 tablet (8 mg total) by mouth 2 (two) times daily as needed. Start on the third day after chemotherapy. 30 tablet 1  . prochlorperazine (COMPAZINE) 10 MG tablet Take 1 tablet (10 mg total) by mouth  every 6 (six) hours as needed (Nausea or vomiting). 30 tablet 1  . ZIEXTENZO 6 MG/0.6ML injection INJECT 0.6 ML (6 MG) UNDER THE SKIN 48 HOURS AFTER COMPLETION OF CHEMOTHERAPY EVERY 2 WEEKS. TREATMENT CYCLE STARTS 09/15/2020. 0.6 mL 0   No current facility-administered medications for this visit.    PHYSICAL EXAMINATION: ECOG PERFORMANCE STATUS: 1 - Symptomatic but completely ambulatory  Vitals:   11/13/20 1204  BP: 117/69  Pulse: (!) 104  Resp: 18  Temp: 97.8 F (36.6 C)  SpO2: 99%   Filed Weights   11/13/20 1204  Weight: 221 lb 12.8 oz (100.6 kg)    LABORATORY DATA:  I have reviewed the data as listed CMP Latest Ref Rng & Units 11/06/2020 10/25/2020 10/11/2020  Glucose 70 - 99 mg/dL 95 110(H) 124(H)  BUN 6 - 20 mg/dL 15 14 16   Creatinine 0.44 - 1.00 mg/dL 0.74 0.78 0.78  Sodium 135 - 145 mmol/L 139 139 140  Potassium 3.5 - 5.1 mmol/L 3.7 3.6 3.9  Chloride 98 - 111 mmol/L 108 108 107  CO2 22 - 32 mmol/L 26 24 24   Calcium 8.9 - 10.3 mg/dL 9.6 9.1 9.1  Total Protein 6.5 - 8.1 g/dL 6.9 6.9 6.8  Total Bilirubin 0.3 - 1.2 mg/dL 0.5 0.4 0.4  Alkaline Phos 38 - 126 U/L 66 64 52  AST 15 - 41 U/L 22 29 28   ALT 0 -  44 U/L 25 34 34    Lab Results  Component Value Date   WBC 4.5 11/13/2020   HGB 10.5 (L) 11/13/2020   HCT 30.0 (L) 11/13/2020   MCV 92.3 11/13/2020   PLT 322 11/13/2020   NEUTROABS 3.0 11/13/2020    ASSESSMENT & PLAN:  Malignant neoplasm of lower-outer quadrant of left breast of female, estrogen receptor positive ([place]) 08/16/2020:Left lumpectomy [name]): IDC, grade 2, 2.3cm, with intermediate grade DCIS, clear margins, 1/2 left axillary lymph nodes positive for carcinoma.HER-2 equivocal by IHC (2+), negative by FISH, ER+ 95%, PR+40%, Ki67 15%. T2 N1 M0 stage Ib MammaPrint: High risk (average 10-year risk untreated: 29%, predicted benefit of chemo with hormone therapy at 5 years: 94.6%  Treatment plan: 1.Adjuvant chemotherapy withdose dense Adriamycin  and Cytoxan x4 followed by Taxol x12 2.Followed by adjuvant radiation 3.Followed byadjuvant antiestrogen therapy ------------------------------------------------------------------------------------------------------------------------------------ Current treatment: Completed 4 cycles ofdose Adriamycin and Cytoxan, today cycle 2 Taxol Echocardiogram 09/08/2020: EF 60 to 65%  Chemo toxicities: Denies any nausea or vomiting to Taxol.   Patient will use ice to prevent peripheral neuropathy. Return to clinic weekly for Taxol every other week for follow-up with [place]    No orders of the defined types were placed in this encounter.  The patient has a good understanding of the overall plan. she agrees with it. she will call with any problems that may develop before the next visit here.  Total time spent: 30 mins including face to face time and time spent for planning, charting and coordination of care  [name], MD 11/13/2020  I, Cloyde Reams Dorshimer, am acting as scribe for Dr. [name].  I have reviewed the above documentation for accuracy and completeness, and I agree with the above.

## 2020-11-13 ENCOUNTER — Inpatient Hospital Stay: Payer: 59

## 2020-11-13 ENCOUNTER — Encounter: Payer: Self-pay | Admitting: *Deleted

## 2020-11-13 ENCOUNTER — Other Ambulatory Visit: Payer: Self-pay

## 2020-11-13 ENCOUNTER — Inpatient Hospital Stay (HOSPITAL_BASED_OUTPATIENT_CLINIC_OR_DEPARTMENT_OTHER): Payer: 59 | Admitting: Hematology and Oncology

## 2020-11-13 VITALS — HR 98

## 2020-11-13 DIAGNOSIS — Z95828 Presence of other vascular implants and grafts: Secondary | ICD-10-CM

## 2020-11-13 DIAGNOSIS — Z170 Estrogen receptor positive status [ER+]: Secondary | ICD-10-CM | POA: Diagnosis not present

## 2020-11-13 DIAGNOSIS — C50512 Malignant neoplasm of lower-outer quadrant of left female breast: Secondary | ICD-10-CM

## 2020-11-13 DIAGNOSIS — Z23 Encounter for immunization: Secondary | ICD-10-CM

## 2020-11-13 LAB — CBC WITH DIFFERENTIAL (CANCER CENTER ONLY)
Abs Immature Granulocytes: 0.05 10*3/uL (ref 0.00–0.07)
Basophils Absolute: 0.1 10*3/uL (ref 0.0–0.1)
Basophils Relative: 2 %
Eosinophils Absolute: 0 10*3/uL (ref 0.0–0.5)
Eosinophils Relative: 0 %
HCT: 30 % — ABNORMAL LOW (ref 36.0–46.0)
Hemoglobin: 10.5 g/dL — ABNORMAL LOW (ref 12.0–15.0)
Immature Granulocytes: 1 %
Lymphocytes Relative: 22 %
Lymphs Abs: 1 10*3/uL (ref 0.7–4.0)
MCH: 32.3 pg (ref 26.0–34.0)
MCHC: 35 g/dL (ref 30.0–36.0)
MCV: 92.3 fL (ref 80.0–100.0)
Monocytes Absolute: 0.4 10*3/uL (ref 0.1–1.0)
Monocytes Relative: 9 %
Neutro Abs: 3 10*3/uL (ref 1.7–7.7)
Neutrophils Relative %: 66 %
Platelet Count: 322 10*3/uL (ref 150–400)
RBC: 3.25 MIL/uL — ABNORMAL LOW (ref 3.87–5.11)
RDW: 16.9 % — ABNORMAL HIGH (ref 11.5–15.5)
WBC Count: 4.5 10*3/uL (ref 4.0–10.5)
nRBC: 0 % (ref 0.0–0.2)

## 2020-11-13 LAB — CMP (CANCER CENTER ONLY)
ALT: 54 U/L — ABNORMAL HIGH (ref 0–44)
AST: 35 U/L (ref 15–41)
Albumin: 3.7 g/dL (ref 3.5–5.0)
Alkaline Phosphatase: 58 U/L (ref 38–126)
Anion gap: 10 (ref 5–15)
BUN: 13 mg/dL (ref 6–20)
CO2: 23 mmol/L (ref 22–32)
Calcium: 9.8 mg/dL (ref 8.9–10.3)
Chloride: 105 mmol/L (ref 98–111)
Creatinine: 0.77 mg/dL (ref 0.44–1.00)
GFR, Estimated: >60 mL/min (ref >60)
Glucose, Bld: 96 mg/dL (ref 70–99)
Potassium: 4 mmol/L (ref 3.5–5.1)
Sodium: 138 mmol/L (ref 135–145)
Total Bilirubin: 0.5 mg/dL (ref 0.3–1.2)
Total Protein: 7 g/dL (ref 6.5–8.1)

## 2020-11-13 MED ORDER — ONDANSETRON HCL 4 MG/2ML IJ SOLN
INTRAMUSCULAR | Status: AC
  Filled 2020-11-13: qty 4

## 2020-11-13 MED ORDER — SODIUM CHLORIDE 0.9 % IV SOLN
80.0000 mg/m2 | Freq: Once | INTRAVENOUS | Status: AC
  Administered 2020-11-13: 174 mg via INTRAVENOUS
  Filled 2020-11-13: qty 29

## 2020-11-13 MED ORDER — SODIUM CHLORIDE 0.9% FLUSH
10.0000 mL | INTRAVENOUS | Status: AC | PRN
  Administered 2020-11-13: 10 mL
  Filled 2020-11-13: qty 10

## 2020-11-13 MED ORDER — ONDANSETRON HCL 4 MG/2ML IJ SOLN
8.0000 mg | Freq: Once | INTRAMUSCULAR | Status: AC
  Administered 2020-11-13: 8 mg via INTRAVENOUS

## 2020-11-13 MED ORDER — DIPHENHYDRAMINE HCL 50 MG/ML IJ SOLN
INTRAMUSCULAR | Status: AC
  Filled 2020-11-13: qty 1

## 2020-11-13 MED ORDER — SODIUM CHLORIDE 0.9 % IV SOLN
10.0000 mg | Freq: Once | INTRAVENOUS | Status: AC
  Administered 2020-11-13: 10 mg via INTRAVENOUS
  Filled 2020-11-13: qty 10

## 2020-11-13 MED ORDER — SODIUM CHLORIDE 0.9% FLUSH
10.0000 mL | INTRAVENOUS | Status: DC | PRN
Start: 2020-11-13 — End: 2020-11-13
  Administered 2020-11-13: 10 mL
  Filled 2020-11-13: qty 10

## 2020-11-13 MED ORDER — SODIUM CHLORIDE 0.9 % IV SOLN
Freq: Once | INTRAVENOUS | Status: AC
  Filled 2020-11-13: qty 250

## 2020-11-13 MED ORDER — FAMOTIDINE IN NACL 20-0.9 MG/50ML-% IV SOLN
20.0000 mg | Freq: Once | INTRAVENOUS | Status: AC
  Administered 2020-11-13: 20 mg via INTRAVENOUS

## 2020-11-13 MED ORDER — HEPARIN SOD (PORK) LOCK FLUSH 100 UNIT/ML IV SOLN
500.0000 [IU] | Freq: Once | INTRAVENOUS | Status: AC | PRN
  Administered 2020-11-13: 500 [IU]
  Filled 2020-11-13: qty 5

## 2020-11-13 MED ORDER — DIPHENHYDRAMINE HCL 50 MG/ML IJ SOLN
25.0000 mg | Freq: Once | INTRAMUSCULAR | Status: AC
  Administered 2020-11-13: 25 mg via INTRAVENOUS

## 2020-11-13 MED ORDER — FAMOTIDINE IN NACL 20-0.9 MG/50ML-% IV SOLN
INTRAVENOUS | Status: AC
  Filled 2020-11-13: qty 50

## 2020-11-13 NOTE — Patient Instructions (Signed)

## 2020-11-13 NOTE — Patient Instructions (Signed)
[facility] Discharge Instructions for Patients Receiving Chemotherapy  Today you received the following chemotherapy agents :  Taxol.  To help prevent nausea and vomiting after your treatment, we encourage you to take your nausea medication as prescribed.   If you develop nausea and vomiting that is not controlled by your nausea medication, call the clinic.   BELOW ARE SYMPTOMS THAT SHOULD BE REPORTED IMMEDIATELY:  *FEVER GREATER THAN 100.5 F  *CHILLS WITH OR WITHOUT FEVER  NAUSEA AND VOMITING THAT IS NOT CONTROLLED WITH YOUR NAUSEA MEDICATION  *UNUSUAL SHORTNESS OF BREATH  *UNUSUAL BRUISING OR BLEEDING  TENDERNESS IN MOUTH AND THROAT WITH OR WITHOUT PRESENCE OF ULCERS  *URINARY PROBLEMS  *BOWEL PROBLEMS  UNUSUAL RASH Items with * indicate a potential emergency and should be followed up as soon as possible.  Feel free to call the clinic should you have any questions or concerns. The clinic phone number is [phone].  Please show the CHEMO ALERT CARD at check-in to the Emergency Department and triage nurse.   

## 2020-11-13 NOTE — Assessment & Plan Note (Signed)
08/16/2020:Left lumpectomy Lindsay Hampton): IDC, grade 2, 2.3cm, with intermediate grade DCIS, clear margins, 1/2 left axillary lymph nodes positive for carcinoma.HER-2 equivocal by IHC (2+), negative by FISH, ER+ 95%, PR+40%, Ki67 15%. T2 N1 M0 stage Ib MammaPrint: High risk (average 10-year risk untreated: 29%, predicted benefit of chemo with hormone therapy at 5 years: 94.6%  Treatment plan: 1.Adjuvant chemotherapy withdose dense Adriamycin and Cytoxan x4 followed by Taxol x12 2.Followed by adjuvant radiation 3.Followed byadjuvant antiestrogen therapy ------------------------------------------------------------------------------------------------------------------------------------ Current treatment: Completed 4 cycles ofdose Adriamycin and Cytoxan, today cycle 2 Taxol Echocardiogram 09/08/2020: EF 60 to 65%  Chemo toxicities: Mild nausea managed with antiemetics Mild fatigue managed with activity   Patient will use eyes to prevent peripheral neuropathy. Return to clinic weekly for Taxol every other week for follow-up with [place]

## 2020-11-13 NOTE — Progress Notes (Signed)
   Covid-19 Vaccination Clinic  Name:  DESTANY SEVERNS    MRN: [id number] DOB: March 20, 1966  11/13/2020  Ms. Perren was observed post Covid-19 immunization for [employer] booster shot without incident. She was provided with Vaccine Information Sheet and instruction to access the V-Safe system.   Ms. Iovine was instructed to call 911 with any severe reactions post vaccine: Marland Kitchen Difficulty breathing  . Swelling of face and throat  . A fast heartbeat  . A bad rash all over body  . Dizziness and weakness

## 2020-11-14 ENCOUNTER — Telehealth: Payer: Self-pay | Admitting: *Deleted

## 2020-11-14 NOTE — Telephone Encounter (Signed)
Received call from pt with complaint of blurry vision in right eye.  Pt states her contacts were really bothering her this morning and replaced them several times.  Pt states she finally decided to remove contacts and wear glasses today.  Pt states blurry vision is starting to subside since removing contact lenses.  Per MD eyes may be irritated from repetitive contact usage today in combination with dry eye symptoms from chemotherapy. Per MD pt to apply OTC artificial tears and  wear glasses to allow eyes to recover .  Pt educated to call our office if symptoms get worse or if pt starts to experience headache and double vision.  Pt verbalized understanding and appreciative of the advice.

## 2020-11-15 ENCOUNTER — Other Ambulatory Visit: Payer: 59

## 2020-11-15 ENCOUNTER — Ambulatory Visit: Payer: 59 | Admitting: Hematology and Oncology

## 2020-11-15 ENCOUNTER — Ambulatory Visit: Payer: 59

## 2020-11-16 ENCOUNTER — Telehealth: Payer: Self-pay | Admitting: Hematology and Oncology

## 2020-11-16 NOTE — Telephone Encounter (Signed)
No 12/20 los, no changes made to pt schedule

## 2020-11-20 ENCOUNTER — Ambulatory Visit: Payer: 59 | Attending: Surgery | Admitting: Physical Therapy

## 2020-11-20 ENCOUNTER — Inpatient Hospital Stay: Payer: 59

## 2020-11-20 ENCOUNTER — Other Ambulatory Visit: Payer: Self-pay

## 2020-11-20 VITALS — BP 121/71 | HR 84 | Temp 98.1°F | Resp 18

## 2020-11-20 DIAGNOSIS — Z170 Estrogen receptor positive status [ER+]: Secondary | ICD-10-CM

## 2020-11-20 DIAGNOSIS — C50512 Malignant neoplasm of lower-outer quadrant of left female breast: Secondary | ICD-10-CM | POA: Diagnosis not present

## 2020-11-20 DIAGNOSIS — Z95828 Presence of other vascular implants and grafts: Secondary | ICD-10-CM

## 2020-11-20 DIAGNOSIS — Z483 Aftercare following surgery for neoplasm: Secondary | ICD-10-CM

## 2020-11-20 LAB — CBC WITH DIFFERENTIAL (CANCER CENTER ONLY)
Abs Immature Granulocytes: 0.05 10*3/uL (ref 0.00–0.07)
Basophils Absolute: 0.1 10*3/uL (ref 0.0–0.1)
Basophils Relative: 3 %
Eosinophils Absolute: 0.1 10*3/uL (ref 0.0–0.5)
Eosinophils Relative: 2 %
HCT: 28.6 % — ABNORMAL LOW (ref 36.0–46.0)
Hemoglobin: 9.9 g/dL — ABNORMAL LOW (ref 12.0–15.0)
Immature Granulocytes: 1 %
Lymphocytes Relative: 27 %
Lymphs Abs: 1 10*3/uL (ref 0.7–4.0)
MCH: 32.8 pg (ref 26.0–34.0)
MCHC: 34.6 g/dL (ref 30.0–36.0)
MCV: 94.7 fL (ref 80.0–100.0)
Monocytes Absolute: 0.3 10*3/uL (ref 0.1–1.0)
Monocytes Relative: 10 %
Neutro Abs: 2.1 10*3/uL (ref 1.7–7.7)
Neutrophils Relative %: 57 %
Platelet Count: 391 10*3/uL (ref 150–400)
RBC: 3.02 MIL/uL — ABNORMAL LOW (ref 3.87–5.11)
RDW: 16.9 % — ABNORMAL HIGH (ref 11.5–15.5)
WBC Count: 3.6 10*3/uL — ABNORMAL LOW (ref 4.0–10.5)
nRBC: 0.6 % — ABNORMAL HIGH (ref 0.0–0.2)

## 2020-11-20 LAB — CMP (CANCER CENTER ONLY)
ALT: 46 U/L — ABNORMAL HIGH (ref 0–44)
AST: 33 U/L (ref 15–41)
Albumin: 3.7 g/dL (ref 3.5–5.0)
Alkaline Phosphatase: 44 U/L (ref 38–126)
Anion gap: 6 (ref 5–15)
BUN: 17 mg/dL (ref 6–20)
CO2: 25 mmol/L (ref 22–32)
Calcium: 9.5 mg/dL (ref 8.9–10.3)
Chloride: 107 mmol/L (ref 98–111)
Creatinine: 0.77 mg/dL (ref 0.44–1.00)
GFR, Estimated: >60 mL/min (ref >60)
Glucose, Bld: 92 mg/dL (ref 70–99)
Potassium: 4 mmol/L (ref 3.5–5.1)
Sodium: 138 mmol/L (ref 135–145)
Total Bilirubin: 0.5 mg/dL (ref 0.3–1.2)
Total Protein: 6.9 g/dL (ref 6.5–8.1)

## 2020-11-20 MED ORDER — SODIUM CHLORIDE 0.9 % IV SOLN
Freq: Once | INTRAVENOUS | Status: AC
  Filled 2020-11-20: qty 250

## 2020-11-20 MED ORDER — SODIUM CHLORIDE 0.9 % IV SOLN
80.0000 mg/m2 | Freq: Once | INTRAVENOUS | Status: AC
  Administered 2020-11-20: 174 mg via INTRAVENOUS
  Filled 2020-11-20: qty 29

## 2020-11-20 MED ORDER — SODIUM CHLORIDE 0.9% FLUSH
10.0000 mL | Freq: Once | INTRAVENOUS | Status: AC
  Administered 2020-11-20: 10 mL
  Filled 2020-11-20: qty 10

## 2020-11-20 MED ORDER — SODIUM CHLORIDE 0.9 % IV SOLN
10.0000 mg | Freq: Once | INTRAVENOUS | Status: AC
  Administered 2020-11-20: 10 mg via INTRAVENOUS
  Filled 2020-11-20: qty 10

## 2020-11-20 MED ORDER — SODIUM CHLORIDE 0.9% FLUSH
10.0000 mL | INTRAVENOUS | Status: DC | PRN
  Administered 2020-11-20: 10 mL
  Filled 2020-11-20: qty 10

## 2020-11-20 MED ORDER — DIPHENHYDRAMINE HCL 50 MG/ML IJ SOLN
INTRAMUSCULAR | Status: AC
  Filled 2020-11-20: qty 1

## 2020-11-20 MED ORDER — DIPHENHYDRAMINE HCL 50 MG/ML IJ SOLN
25.0000 mg | Freq: Once | INTRAMUSCULAR | Status: AC
  Administered 2020-11-20: 25 mg via INTRAVENOUS

## 2020-11-20 MED ORDER — ONDANSETRON HCL 4 MG/2ML IJ SOLN
INTRAMUSCULAR | Status: AC
  Filled 2020-11-20: qty 4

## 2020-11-20 MED ORDER — FAMOTIDINE IN NACL 20-0.9 MG/50ML-% IV SOLN
INTRAVENOUS | Status: AC
  Filled 2020-11-20: qty 50

## 2020-11-20 MED ORDER — HEPARIN SOD (PORK) LOCK FLUSH 100 UNIT/ML IV SOLN
500.0000 [IU] | Freq: Once | INTRAVENOUS | Status: AC | PRN
  Administered 2020-11-20: 500 [IU]
  Filled 2020-11-20: qty 5

## 2020-11-20 MED ORDER — FAMOTIDINE IN NACL 20-0.9 MG/50ML-% IV SOLN
20.0000 mg | Freq: Once | INTRAVENOUS | Status: AC
  Administered 2020-11-20: 20 mg via INTRAVENOUS

## 2020-11-20 MED ORDER — ONDANSETRON HCL 4 MG/2ML IJ SOLN
8.0000 mg | Freq: Once | INTRAMUSCULAR | Status: AC
  Administered 2020-11-20: 8 mg via INTRAVENOUS

## 2020-11-20 NOTE — Therapy (Addendum)
[place], [place], [address] Phone: [phone]   Fax:  [phone]  Physical Therapy Treatment  Patient Details  Name: Lindsay Hampton MRN: [id number] Date of Birth: 02-07-1966 Referring Provider (PT): Dr. [name]   Encounter Date: 11/20/2020   PT End of Session - 11/20/20 4503    Visit Number 6   same due to SOZO screen   Number of Visits 10    PT Start Time 0902    PT Stop Time 0920    PT Time Calculation (min) 18 min    Activity Tolerance Patient tolerated treatment well           Past Medical History:  Diagnosis Date  . Allergy    seasonal  . Anemia    After childbirth  . Anxiety   . Breast cancer ([place])   . Family history of breast cancer 07/26/2020  . Family history of colon cancer 07/26/2020  . Family history of prostate cancer 07/26/2020  . GERD (gastroesophageal reflux disease)   . Pneumonia    In 20s    Past Surgical History:  Procedure Laterality Date  . AXILLARY LYMPH NODE DISSECTION Left 08/16/2020   Procedure: LEFT TARGETED AXILLARY LYMPH NODE DISSECTION;  Surgeon: [name], MD;  Location: [facility];  Service: General;  Laterality: Left;  . BREAST LUMPECTOMY WITH RADIOACTIVE SEED AND AXILLARY LYMPH NODE DISSECTION Left 08/16/2020   Procedure: LEFT BREAST LUMPECTOMY WITH RADIOACTIVE SEED;  Surgeon: [name], MD;  Location: [facility];  Service: General;  Laterality: Left;  PEC BLOCK  . BREAST SURGERY Left 06/2020   breast bx   . CERVICAL BIOPSY  W/ LOOP ELECTRODE EXCISION  12/2017  . CESAREAN SECTION    . DILATION AND CURETTAGE OF UTERUS    . PORTACATH PLACEMENT  09/12/2020   Procedure: INSERTION PORT-A-CATH WITH ULTRASOUND;  Surgeon: [name], MD;  Location: [facility];  Service: General;;  . WISDOM TOOTH EXTRACTION  2020    There were no vitals filed for this visit.   Subjective Assessment - 11/20/20 0918    Subjective Pt here for SOZO screen    Pertinent History  Patient was diagnosed on 07/06/2020 with left grade II invasive ductal carcinoma breast cancer. Patient reports she underwent a left lumpectomy and sentinel node biopsy (1/2 nodes positive) on 08/16/2020. It is ER/PR positive and HER2 negative with a Ki67 of 15%. She has a positive axillary lymph node.    Patient Stated Goals See how my arm is doing                  L-DEX FLOWSHEETS - 11/20/20 0900      L-DEX LYMPHEDEMA SCREENING   Measurement Type Unilateral    L-DEX MEASUREMENT EXTREMITY Upper Extremity    POSITION  Standing    DOMINANT SIDE Right    At Risk Side Left    BASELINE SCORE (UNILATERAL) 4.3    L-DEX SCORE (UNILATERAL) -0.4                                  PT Long Term Goals - 10/12/20 1136      PT LONG TERM GOAL #1   Title Patient will demonstrate she has regained full shoulder ROM and function post operatively compared to baseline measurements.    Time 4    Period Weeks    Status Achieved  PT LONG TERM GOAL #2   Title Patient will report >/= 25% reduction in left shoulder pain while performing daily tasks.    Baseline 60-70% better    Time 4    Period Weeks    Status Achieved      PT LONG TERM GOAL #3   Title Patient will report >/= 25% improvement in her ability to sleep at night    Baseline 60% better    Time 4    Period Weeks    Status Achieved      PT LONG TERM GOAL #4   Title 155 abd, 149 flex    Time 4    Status Achieved      PT LONG TERM GOAL #5   Title Pt will be fit for compression sleeve/gauntlet and will be independent with donning/doffing and proper wear    Time 4    Period Weeks    Status New    Target Date 11/09/20                 Plan - 11/20/20 0920    Clinical Impression Statement Pt measured within recommended range of baseline           Patient will benefit from skilled therapeutic intervention in order to improve the following deficits and impairments:  Postural  dysfunction,Decreased knowledge of precautions,Impaired UE functional use,Pain,Decreased range of motion  Visit Diagnosis: Aftercare following surgery for neoplasm     Problem List Patient Active Problem List   Diagnosis Date Noted  . Genetic testing 08/01/2020  . Family history of breast cancer 07/26/2020  . Family history of prostate cancer 07/26/2020  . Family history of colon cancer 07/26/2020  . Malignant neoplasm of lower-outer quadrant of left breast of female, estrogen receptor positive ([place]) 07/20/2020   Donato Heinz. Owens Shark PT  [name] 11/20/2020, 9:30 AM  [place], [place], [address] Phone: [phone]   Fax:  [phone]  Name: Lindsay Hampton MRN: [id number] Date of Birth: 1966-05-28

## 2020-11-20 NOTE — Patient Instructions (Signed)
[facility] Discharge Instructions for Patients Receiving Chemotherapy  Today you received the following chemotherapy agents:  Taxol.  To help prevent nausea and vomiting after your treatment, we encourage you to take your nausea medication as directed.   If you develop nausea and vomiting that is not controlled by your nausea medication, call the clinic.   BELOW ARE SYMPTOMS THAT SHOULD BE REPORTED IMMEDIATELY:  *FEVER GREATER THAN 100.5 F  *CHILLS WITH OR WITHOUT FEVER  NAUSEA AND VOMITING THAT IS NOT CONTROLLED WITH YOUR NAUSEA MEDICATION  *UNUSUAL SHORTNESS OF BREATH  *UNUSUAL BRUISING OR BLEEDING  TENDERNESS IN MOUTH AND THROAT WITH OR WITHOUT PRESENCE OF ULCERS  *URINARY PROBLEMS  *BOWEL PROBLEMS  UNUSUAL RASH Items with * indicate a potential emergency and should be followed up as soon as possible.  Feel free to call the clinic should you have any questions or concerns. The clinic phone number is [phone].  Please show the CHEMO ALERT CARD at check-in to the Emergency Department and triage nurse.   

## 2020-11-27 NOTE — Progress Notes (Signed)
Patient Care Team: [name], [place] as PCP - General (Internal Medicine) [name], RN as Oncology Nurse Navigator [name], RN as Oncology Nurse Navigator [name], MD as Consulting Physician (General Surgery) [name], MD as Consulting Physician (Hematology and Oncology) Kyung Rudd, MD as Consulting Physician (Radiation Oncology)  DIAGNOSIS:    ICD-10-CM   1. Malignant neoplasm of lower-outer quadrant of left breast of female, estrogen receptor positive ([place])  C50.512    Z17.0     SUMMARY OF ONCOLOGIC HISTORY: Oncology History  Malignant neoplasm of lower-outer quadrant of left breast of female, estrogen receptor positive ([place])  07/20/2020 Initial Diagnosis   Screening mammogram detected left breast calcifications, enlarged left axillary lymph nodes, and an asymmetry in the right breast. Diagnostic mammogram showed in the left breast, a 2.4cm mass with calcifications, 3:30 position, a left axillary lymph node with cortical thickness, and in the right breast, no suspicious masses or calcifications. Left breast biopsy showed invasive and in situ mammary carcinoma in the breast and axilla, grade 2, HER-2 equivocal by IHC (2+), negative by FISH, ER+ 95%, PR+40%, Ki67 15%.    07/26/2020 Cancer Staging   Staging form: Breast, AJCC 8th Edition - Clinical stage from 07/26/2020: Stage IIA (cT2, cN1(f), cM0, G2, ER+, PR+, HER2-) - Signed by [name], MD on 07/26/2020   08/01/2020 Genetic Testing   No pathogenic variants detected in [facility] Multi-Cancer Panel.  The Multi-Cancer Panel offered by [facility] includes sequencing and/or deletion duplication testing of the following 85 genes: AIP, ALK, APC, ATM, AXIN2,BAP1,  BARD1, BLM, BMPR1A, BRCA1, BRCA2, BRIP1, CASR, CDC73, CDH1, CDK4, CDKN1B, CDKN1C, CDKN2A (p14ARF), CDKN2A (p16INK4a), CEBPA, CHEK2, CTNNA1, DICER1, DIS3L2, EGFR (c.2369C>T, p.Thr790Met variant only), EPCAM (Deletion/duplication testing only), FH, FLCN,  GATA2, GPC3, GREM1 (Promoter region deletion/duplication testing only), HOXB13 (c.251G>A, p.Gly84Glu), HRAS, KIT, MAX, MEN1, MET, MITF (c.952G>A, p.Glu318Lys variant only), MLH1, MSH2, MSH3, MSH6, MUTYH, NBN, NF1, NF2, NTHL1, PALB2, PDGFRA, PHOX2B, PMS2, POLD1, POLE, POT1, PRKAR1A, PTCH1, PTEN, RAD50, RAD51C, RAD51D, RB1, RECQL4, RET, RNF43, RUNX1, SDHAF2, SDHA (sequence changes only), SDHB, SDHC, SDHD, SMAD4, SMARCA4, SMARCB1, SMARCE1, STK11, SUFU, TERC, TERT, TMEM127, TP53, TSC1, TSC2, VHL, WRN and WT1.  The report date is August 01, 2020.    08/16/2020 Surgery   Left lumpectomy [name]): IDC, grade 2, 2.3cm, with intermediate grade DCIS, clear margins, 1/2 left axillary lymph nodes positive for carcinoma.HER-2 equivocal by IHC (2+), negative by FISH, ER+ 95%, PR+40%, Ki67 15%.    08/24/2020 Cancer Staging   Staging form: Breast, AJCC 8th Edition - Pathologic stage from 08/24/2020: Stage IB (pT2, pN1a, cM0, G2, ER+, PR+, HER2-) - Signed by [name], MD on 08/24/2020   08/30/2020 Miscellaneous   MammaPrint high risk   09/13/2020 -  Chemotherapy   The patient had dexamethasone (DECADRON) 4 MG tablet, 4 mg (100 % of original dose 4 mg), Oral,  Once, 1 of 1 cycle, Start date: 09/04/2020, End date: 09/04/2020 Dose modification: 4 mg (original dose 4 mg, Cycle 0) DOXOrubicin (ADRIAMYCIN) chemo injection 130 mg, 60 mg/m2 = 130 mg, Intravenous,  Once, 4 of 4 cycles Administration: 130 mg (09/13/2020), 130 mg (09/27/2020), 130 mg (10/11/2020), 130 mg (10/25/2020) ondansetron (ZOFRAN) 16 mg in sodium chloride 0.9 % 50 mL IVPB, 16 mg (100 % of original dose 16 mg), Intravenous,  Once, 7 of 16 cycles Dose modification: 16 mg (original dose 16 mg, Cycle 1), 8 mg (original dose 8 mg, Cycle 5) Administration: 16 mg (09/13/2020), 16 mg (09/27/2020),  16 mg (10/11/2020), 16 mg (10/25/2020), 8 mg (11/06/2020), 8 mg (11/13/2020), 8 mg (11/20/2020) cyclophosphamide (CYTOXAN) 1,300 mg in sodium chloride 0.9 % 250 mL  chemo infusion, 600 mg/m2 = 1,300 mg, Intravenous,  Once, 4 of 4 cycles Administration: 1,300 mg (09/13/2020), 1,300 mg (09/27/2020), 1,300 mg (10/11/2020), 1,300 mg (10/25/2020) PACLitaxel (TAXOL) 174 mg in sodium chloride 0.9 % 250 mL chemo infusion (</= 42m/m2), 80 mg/m2 = 174 mg, Intravenous,  Once, 3 of 12 cycles Administration: 174 mg (11/13/2020), 174 mg (11/20/2020), 174 mg (11/06/2020) fosaprepitant (EMEND) 150 mg in sodium chloride 0.9 % 145 mL IVPB, 150 mg, Intravenous,  Once, 4 of 4 cycles Administration: 150 mg (09/13/2020), 150 mg (09/27/2020), 150 mg (10/11/2020), 150 mg (10/25/2020)  for chemotherapy treatment.      CHIEF COMPLIANT: Cycle4 Taxol  INTERVAL HISTORY: Lindsay EMANUELEis a 55y.o. with above-mentioned history of left breast cancer treated with lumpectomyand who is currently on adjuvant chemotherapy withweekly Taxol after completing 4 cycles ofdose dense Adriamycin and Cytoxan.She presents to the clinic todayfora toxicity checkandcycle4. She noticed that the time he appears to be sore and also that she has been having watering of the eyes and grittiness in the right eye.  She is using a mouthwash which appears to be helping her.  She has not noticed any white patches.  ALLERGIES:  has No Known Allergies.  MEDICATIONS:  Current Outpatient Medications  Medication Sig Dispense Refill  . ALPRAZolam (XANAX) 0.25 MG tablet Take 1 tablet (0.25 mg total) by mouth 2 (two) times daily as needed for anxiety. 60 tablet 3  . dexamethasone (DECADRON) 4 MG tablet Take 4 mg by mouth daily.    .Marland Kitchenlidocaine-prilocaine (EMLA) cream Apply to affected area once (Patient taking differently: Apply 1 application topically as needed. Apply to affected area once) 30 g 3  . metroNIDAZOLE (METROCREAM) 0.75 % cream Apply 1 application topically once a week. Apply to face daily    . ondansetron (ZOFRAN) 8 MG tablet Take 1 tablet (8 mg total) by mouth 2 (two) times daily as needed. Start on  the third day after chemotherapy. 30 tablet 1  . prochlorperazine (COMPAZINE) 10 MG tablet Take 1 tablet (10 mg total) by mouth every 6 (six) hours as needed (Nausea or vomiting). 30 tablet 1  . ZIEXTENZO 6 MG/0.6ML injection INJECT 0.6 ML (6 MG) UNDER THE SKIN 48 HOURS AFTER COMPLETION OF CHEMOTHERAPY EVERY 2 WEEKS. TREATMENT CYCLE STARTS 09/15/2020. 0.6 mL 0   No current facility-administered medications for this visit.    PHYSICAL EXAMINATION: ECOG PERFORMANCE STATUS: 1 - Symptomatic but completely ambulatory  There were no vitals filed for this visit. There were no vitals filed for this visit.  LABORATORY DATA:  I have reviewed the data as listed CMP Latest Ref Rng & Units 11/20/2020 11/13/2020 11/06/2020  Glucose 70 - 99 mg/dL 92 96 95  BUN 6 - 20 mg/dL _0 Creatinine 0.44 - 1.00 mg/dL 0.77 0.77 0.74  Sodium 135 - 145 mmol/L 138 138 139  Potassium 3.5 - 5.1 mmol/L 4.0 4.0 3.7  Chloride 98 - 111 mmol/L 107 105 108  CO2 22 - 32 mmol/L _1 Calcium 8.9 - 10.3 mg/dL 9.5 9.8 9.6  Total Protein 6.5 - 8.1 g/dL 6.9 7.0 6.9  Total Bilirubin 0.3 - 1.2 mg/dL 0.5 0.5 0.5  Alkaline Phos 38 - 126 U/L 44 58 66  AST 15 - 41 U/L 33 35 22  ALT 0 - 44  U/L 46(H) 54(H) 25    Lab Results  Component Value Date   WBC 3.6 (L) 11/20/2020   HGB 9.9 (L) 11/20/2020   HCT 28.6 (L) 11/20/2020   MCV 94.7 11/20/2020   PLT 391 11/20/2020   NEUTROABS 2.1 11/20/2020    ASSESSMENT & PLAN:  Malignant neoplasm of lower-outer quadrant of left breast of female, estrogen receptor positive ([place]) 08/16/2020:Left lumpectomy [name]): IDC, grade 2, 2.3cm, with intermediate grade DCIS, clear margins, 1/2 left axillary lymph nodes positive for carcinoma.HER-2 equivocal by IHC (2+), negative by FISH, ER+ 95%, PR+40%, Ki67 15%. T2 N1 M0 stage Ib MammaPrint: High risk (average 10-year risk untreated: 29%, predicted benefit of chemo with hormone therapy at 5 years: 94.6%  Treatment plan: 1.Adjuvant  chemotherapy withdose dense Adriamycin and Cytoxan x4 followed by Taxol x12 2.Followed by adjuvant radiation 3.Followed byadjuvant antiestrogen therapy ------------------------------------------------------------------------------------------------------------------------------------ Current treatment:Completed 4 cycles ofdose Adriamycin and Cytoxan, today cycle 4 Taxol Echocardiogram 09/08/2020: EF 60 to 65%  Chemo toxicities: Denies any nausea or vomiting to Taxol. Mouth feels sore: Currently using a mouthwash. If she notices any white patches then we may have to treat her with nystatin. Watering of eyes: Related to Taxol.  Patient will use ice to prevent peripheral neuropathy. Return to clinic weekly for Taxol every other week for follow-up with [place]     No orders of the defined types were placed in this encounter.  The patient has a good understanding of the overall plan. she agrees with it. she will call with any problems that may develop before the next visit here.  Total time spent: 30 mins including face to face time and time spent for planning, charting and coordination of care  [name], MD 11/28/2020  I, Cloyde Reams Dorshimer, am acting as scribe for Dr. [name].  I have reviewed the above documentation for accuracy and completeness, and I agree with the above.

## 2020-11-28 ENCOUNTER — Inpatient Hospital Stay: Payer: 59

## 2020-11-28 ENCOUNTER — Inpatient Hospital Stay: Payer: 59 | Attending: Hematology and Oncology

## 2020-11-28 ENCOUNTER — Other Ambulatory Visit: Payer: Self-pay

## 2020-11-28 ENCOUNTER — Inpatient Hospital Stay (HOSPITAL_BASED_OUTPATIENT_CLINIC_OR_DEPARTMENT_OTHER): Payer: 59 | Admitting: Hematology and Oncology

## 2020-11-28 DIAGNOSIS — Z9221 Personal history of antineoplastic chemotherapy: Secondary | ICD-10-CM | POA: Insufficient documentation

## 2020-11-28 DIAGNOSIS — D6481 Anemia due to antineoplastic chemotherapy: Secondary | ICD-10-CM | POA: Diagnosis not present

## 2020-11-28 DIAGNOSIS — C773 Secondary and unspecified malignant neoplasm of axilla and upper limb lymph nodes: Secondary | ICD-10-CM | POA: Insufficient documentation

## 2020-11-28 DIAGNOSIS — R7989 Other specified abnormal findings of blood chemistry: Secondary | ICD-10-CM | POA: Diagnosis not present

## 2020-11-28 DIAGNOSIS — T451X5A Adverse effect of antineoplastic and immunosuppressive drugs, initial encounter: Secondary | ICD-10-CM | POA: Insufficient documentation

## 2020-11-28 DIAGNOSIS — Z170 Estrogen receptor positive status [ER+]: Secondary | ICD-10-CM | POA: Diagnosis not present

## 2020-11-28 DIAGNOSIS — C50512 Malignant neoplasm of lower-outer quadrant of left female breast: Secondary | ICD-10-CM | POA: Diagnosis present

## 2020-11-28 DIAGNOSIS — Z7952 Long term (current) use of systemic steroids: Secondary | ICD-10-CM | POA: Diagnosis not present

## 2020-11-28 DIAGNOSIS — Z79899 Other long term (current) drug therapy: Secondary | ICD-10-CM | POA: Insufficient documentation

## 2020-11-28 DIAGNOSIS — Z95828 Presence of other vascular implants and grafts: Secondary | ICD-10-CM

## 2020-11-28 DIAGNOSIS — Z5111 Encounter for antineoplastic chemotherapy: Secondary | ICD-10-CM | POA: Insufficient documentation

## 2020-11-28 LAB — CBC WITH DIFFERENTIAL (CANCER CENTER ONLY)
Abs Immature Granulocytes: 0.04 10*3/uL (ref 0.00–0.07)
Basophils Absolute: 0.1 10*3/uL (ref 0.0–0.1)
Basophils Relative: 2 %
Eosinophils Absolute: 0.1 10*3/uL (ref 0.0–0.5)
Eosinophils Relative: 3 %
HCT: 29.7 % — ABNORMAL LOW (ref 36.0–46.0)
Hemoglobin: 10.2 g/dL — ABNORMAL LOW (ref 12.0–15.0)
Immature Granulocytes: 1 %
Lymphocytes Relative: 23 %
Lymphs Abs: 0.9 10*3/uL (ref 0.7–4.0)
MCH: 33 pg (ref 26.0–34.0)
MCHC: 34.3 g/dL (ref 30.0–36.0)
MCV: 96.1 fL (ref 80.0–100.0)
Monocytes Absolute: 0.4 10*3/uL (ref 0.1–1.0)
Monocytes Relative: 11 %
Neutro Abs: 2.4 10*3/uL (ref 1.7–7.7)
Neutrophils Relative %: 60 %
Platelet Count: 307 10*3/uL (ref 150–400)
RBC: 3.09 MIL/uL — ABNORMAL LOW (ref 3.87–5.11)
RDW: 17.2 % — ABNORMAL HIGH (ref 11.5–15.5)
WBC Count: 4.1 10*3/uL (ref 4.0–10.5)
nRBC: 1 % — ABNORMAL HIGH (ref 0.0–0.2)

## 2020-11-28 LAB — CMP (CANCER CENTER ONLY)
ALT: 48 U/L — ABNORMAL HIGH (ref 0–44)
AST: 32 U/L (ref 15–41)
Albumin: 3.7 g/dL (ref 3.5–5.0)
Alkaline Phosphatase: 51 U/L (ref 38–126)
Anion gap: 6 (ref 5–15)
BUN: 13 mg/dL (ref 6–20)
CO2: 23 mmol/L (ref 22–32)
Calcium: 9.6 mg/dL (ref 8.9–10.3)
Chloride: 111 mmol/L (ref 98–111)
Creatinine: 0.77 mg/dL (ref 0.44–1.00)
GFR, Estimated: >60 mL/min (ref >60)
Glucose, Bld: 103 mg/dL — ABNORMAL HIGH (ref 70–99)
Potassium: 4.3 mmol/L (ref 3.5–5.1)
Sodium: 140 mmol/L (ref 135–145)
Total Bilirubin: 0.6 mg/dL (ref 0.3–1.2)
Total Protein: 6.7 g/dL (ref 6.5–8.1)

## 2020-11-28 MED ORDER — ONDANSETRON HCL 4 MG/2ML IJ SOLN
INTRAMUSCULAR | Status: AC
  Filled 2020-11-28: qty 4

## 2020-11-28 MED ORDER — FAMOTIDINE IN NACL 20-0.9 MG/50ML-% IV SOLN
INTRAVENOUS | Status: AC
  Filled 2020-11-28: qty 50

## 2020-11-28 MED ORDER — SODIUM CHLORIDE 0.9 % IV SOLN
Freq: Once | INTRAVENOUS | Status: AC
  Filled 2020-11-28: qty 250

## 2020-11-28 MED ORDER — SODIUM CHLORIDE 0.9% FLUSH
10.0000 mL | INTRAVENOUS | Status: DC | PRN
  Administered 2020-11-28: 10 mL
  Filled 2020-11-28: qty 10

## 2020-11-28 MED ORDER — HEPARIN SOD (PORK) LOCK FLUSH 100 UNIT/ML IV SOLN
500.0000 [IU] | Freq: Once | INTRAVENOUS | Status: AC | PRN
  Administered 2020-11-28: 500 [IU]
  Filled 2020-11-28: qty 5

## 2020-11-28 MED ORDER — SODIUM CHLORIDE 0.9 % IV SOLN
10.0000 mg | Freq: Once | INTRAVENOUS | Status: AC
  Administered 2020-11-28: 10 mg via INTRAVENOUS
  Filled 2020-11-28: qty 10

## 2020-11-28 MED ORDER — DIPHENHYDRAMINE HCL 50 MG/ML IJ SOLN
25.0000 mg | Freq: Once | INTRAMUSCULAR | Status: AC
Start: 2020-11-28 — End: 2020-11-28
  Administered 2020-11-28: 25 mg via INTRAVENOUS

## 2020-11-28 MED ORDER — SODIUM CHLORIDE 0.9 % IV SOLN
80.0000 mg/m2 | Freq: Once | INTRAVENOUS | Status: AC
  Administered 2020-11-28: 174 mg via INTRAVENOUS
  Filled 2020-11-28: qty 29

## 2020-11-28 MED ORDER — PROCHLORPERAZINE MALEATE 10 MG PO TABS: 10.0000 mg | ORAL_TABLET | Freq: Four times a day (QID) | ORAL | 1 refills | Status: DC | PRN

## 2020-11-28 MED ORDER — DIPHENHYDRAMINE HCL 50 MG/ML IJ SOLN
INTRAMUSCULAR | Status: AC
  Filled 2020-11-28: qty 1

## 2020-11-28 MED ORDER — SODIUM CHLORIDE 0.9% FLUSH
10.0000 mL | Freq: Once | INTRAVENOUS | Status: AC
  Administered 2020-11-28: 10 mL
  Filled 2020-11-28: qty 10

## 2020-11-28 MED ORDER — ONDANSETRON HCL 4 MG/2ML IJ SOLN
8.0000 mg | Freq: Once | INTRAMUSCULAR | Status: AC
  Administered 2020-11-28: 8 mg via INTRAVENOUS

## 2020-11-28 MED ORDER — FAMOTIDINE IN NACL 20-0.9 MG/50ML-% IV SOLN
20.0000 mg | Freq: Once | INTRAVENOUS | Status: AC
Start: 2020-11-28 — End: 2020-11-28
  Administered 2020-11-28: 20 mg via INTRAVENOUS

## 2020-11-28 NOTE — Assessment & Plan Note (Signed)
08/16/2020:Left lumpectomy Lindsay Hampton): IDC, grade 2, 2.3cm, with intermediate grade DCIS, clear margins, 1/2 left axillary lymph nodes positive for carcinoma.HER-2 equivocal by IHC (2+), negative by FISH, ER+ 95%, PR+40%, Ki67 15%. T2 N1 M0 stage Ib MammaPrint: High risk (average 10-year risk untreated: 29%, predicted benefit of chemo with hormone therapy at 5 years: 94.6%  Treatment plan: 1.Adjuvant chemotherapy withdose dense Adriamycin and Cytoxan x4 followed by Taxol x12 2.Followed by adjuvant radiation 3.Followed byadjuvant antiestrogen therapy ------------------------------------------------------------------------------------------------------------------------------------ Current treatment:Completed 4 cycles ofdose Adriamycin and Cytoxan, today cycle 4 Taxol Echocardiogram 09/08/2020: EF 60 to 65%  Chemo toxicities: Denies any nausea or vomiting to Taxol.  Patient will use ice to prevent peripheral neuropathy. Return to clinic weekly for Taxol every other week for follow-up with [place]

## 2020-11-28 NOTE — Patient Instructions (Signed)
[facility] Discharge Instructions for Patients Receiving Chemotherapy  Today you received the following chemotherapy agent: Taxol.  To help prevent nausea and vomiting after your treatment, we encourage you to take your nausea medication as directed.  If you develop nausea and vomiting that is not controlled by your nausea medication, call the clinic.   BELOW ARE SYMPTOMS THAT SHOULD BE REPORTED IMMEDIATELY:  *FEVER GREATER THAN 100.5 F  *CHILLS WITH OR WITHOUT FEVER  NAUSEA AND VOMITING THAT IS NOT CONTROLLED WITH YOUR NAUSEA MEDICATION  *UNUSUAL SHORTNESS OF BREATH  *UNUSUAL BRUISING OR BLEEDING  TENDERNESS IN MOUTH AND THROAT WITH OR WITHOUT PRESENCE OF ULCERS  *URINARY PROBLEMS  *BOWEL PROBLEMS  UNUSUAL RASH Items with * indicate a potential emergency and should be followed up as soon as possible.  Feel free to call the clinic should you have any questions or concerns. The clinic phone number is [phone].  Please show the CHEMO ALERT CARD at check-in to the Emergency Department and triage nurse.   

## 2020-11-29 ENCOUNTER — Other Ambulatory Visit: Payer: Self-pay | Admitting: *Deleted

## 2020-11-29 MED ORDER — ONDANSETRON 8 MG PO TBDP: 8.0000 mg | ORAL_TABLET | Freq: Three times a day (TID) | ORAL | 1 refills | Status: DC | PRN

## 2020-11-29 NOTE — Progress Notes (Signed)
Received call from pt with complaint of nausea and vomiting x1 day not relieved by Compazine 10 mg tablet.  Pt states she is able to drink fluids and denies symptoms of dehydration.  Per MD pt to be prescribed Zofran 8 mg ODT (disintegrating tablet).  Prescription sent to pharmacy on file and pt educated on medication administration and to continue drinking fluids and eating a bland diet.  RN also educated pt to call the office tomorrow if symptoms do not improve.  Pt verbalized understanding.

## 2020-11-30 ENCOUNTER — Telehealth: Payer: Self-pay | Admitting: Hematology and Oncology

## 2020-11-30 NOTE — Telephone Encounter (Signed)
No 1/4 los, no changes made to pt schedule  

## 2020-12-04 ENCOUNTER — Inpatient Hospital Stay: Payer: 59

## 2020-12-04 ENCOUNTER — Other Ambulatory Visit: Payer: Self-pay

## 2020-12-04 ENCOUNTER — Other Ambulatory Visit: Payer: Self-pay | Admitting: *Deleted

## 2020-12-04 ENCOUNTER — Telehealth: Payer: Self-pay | Admitting: *Deleted

## 2020-12-04 ENCOUNTER — Other Ambulatory Visit: Payer: Self-pay | Admitting: Hematology and Oncology

## 2020-12-04 VITALS — BP 127/84 | HR 99 | Temp 98.0°F | Resp 18

## 2020-12-04 DIAGNOSIS — C50512 Malignant neoplasm of lower-outer quadrant of left female breast: Secondary | ICD-10-CM

## 2020-12-04 DIAGNOSIS — Z170 Estrogen receptor positive status [ER+]: Secondary | ICD-10-CM

## 2020-12-04 DIAGNOSIS — Z95828 Presence of other vascular implants and grafts: Secondary | ICD-10-CM

## 2020-12-04 LAB — CBC WITH DIFFERENTIAL (CANCER CENTER ONLY)
Abs Immature Granulocytes: 0.07 10*3/uL (ref 0.00–0.07)
Basophils Absolute: 0.1 10*3/uL (ref 0.0–0.1)
Basophils Relative: 1 %
Eosinophils Absolute: 0 10*3/uL (ref 0.0–0.5)
Eosinophils Relative: 0 %
HCT: 33.1 % — ABNORMAL LOW (ref 36.0–46.0)
Hemoglobin: 11.3 g/dL — ABNORMAL LOW (ref 12.0–15.0)
Immature Granulocytes: 1 %
Lymphocytes Relative: 11 %
Lymphs Abs: 1.1 10*3/uL (ref 0.7–4.0)
MCH: 32.7 pg (ref 26.0–34.0)
MCHC: 34.1 g/dL (ref 30.0–36.0)
MCV: 95.7 fL (ref 80.0–100.0)
Monocytes Absolute: 0.5 10*3/uL (ref 0.1–1.0)
Monocytes Relative: 5 %
Neutro Abs: 7.7 10*3/uL (ref 1.7–7.7)
Neutrophils Relative %: 82 %
Platelet Count: 313 10*3/uL (ref 150–400)
RBC: 3.46 MIL/uL — ABNORMAL LOW (ref 3.87–5.11)
RDW: 16.6 % — ABNORMAL HIGH (ref 11.5–15.5)
WBC Count: 9.4 10*3/uL (ref 4.0–10.5)
nRBC: 0 % (ref 0.0–0.2)

## 2020-12-04 LAB — CMP (CANCER CENTER ONLY)
ALT: 154 U/L — ABNORMAL HIGH (ref 0–44)
AST: 57 U/L — ABNORMAL HIGH (ref 15–41)
Albumin: 3.8 g/dL (ref 3.5–5.0)
Alkaline Phosphatase: 96 U/L (ref 38–126)
Anion gap: 9 (ref 5–15)
BUN: 16 mg/dL (ref 6–20)
CO2: 24 mmol/L (ref 22–32)
Calcium: 9.3 mg/dL (ref 8.9–10.3)
Chloride: 101 mmol/L (ref 98–111)
Creatinine: 0.72 mg/dL (ref 0.44–1.00)
GFR, Estimated: >60 mL/min (ref >60)
Glucose, Bld: 110 mg/dL — ABNORMAL HIGH (ref 70–99)
Potassium: 3.5 mmol/L (ref 3.5–5.1)
Sodium: 134 mmol/L — ABNORMAL LOW (ref 135–145)
Total Bilirubin: 2.3 mg/dL — ABNORMAL HIGH (ref 0.3–1.2)
Total Protein: 7.3 g/dL (ref 6.5–8.1)

## 2020-12-04 LAB — MAGNESIUM: Magnesium: 1.6 mg/dL — ABNORMAL LOW (ref 1.7–2.4)

## 2020-12-04 MED ORDER — DIPHENHYDRAMINE HCL 50 MG/ML IJ SOLN
INTRAMUSCULAR | Status: AC
  Filled 2020-12-04: qty 1

## 2020-12-04 MED ORDER — SODIUM CHLORIDE 0.9 % IV SOLN
Freq: Once | INTRAVENOUS | Status: AC
  Filled 2020-12-04: qty 250

## 2020-12-04 MED ORDER — FAMOTIDINE IN NACL 20-0.9 MG/50ML-% IV SOLN
INTRAVENOUS | Status: AC
  Filled 2020-12-04: qty 50

## 2020-12-04 MED ORDER — HEPARIN SOD (PORK) LOCK FLUSH 100 UNIT/ML IV SOLN
500.0000 [IU] | Freq: Once | INTRAVENOUS | Status: AC | PRN
  Administered 2020-12-04: 500 [IU]
  Filled 2020-12-04: qty 5

## 2020-12-04 MED ORDER — SODIUM CHLORIDE 0.9 % IV SOLN
65.0000 mg/m2 | Freq: Once | INTRAVENOUS | Status: AC
  Administered 2020-12-04: 144 mg via INTRAVENOUS
  Filled 2020-12-04: qty 24

## 2020-12-04 MED ORDER — ONDANSETRON HCL 4 MG/2ML IJ SOLN
INTRAMUSCULAR | Status: AC
  Filled 2020-12-04: qty 4

## 2020-12-04 MED ORDER — SODIUM CHLORIDE 0.9% FLUSH
10.0000 mL | Freq: Once | INTRAVENOUS | Status: AC
  Administered 2020-12-04: 10 mL
  Filled 2020-12-04: qty 10

## 2020-12-04 MED ORDER — SODIUM CHLORIDE 0.9% FLUSH
10.0000 mL | INTRAVENOUS | Status: DC | PRN
  Administered 2020-12-04: 10 mL
  Filled 2020-12-04: qty 10

## 2020-12-04 MED ORDER — SODIUM CHLORIDE 0.9 % IV SOLN
10.0000 mg | Freq: Once | INTRAVENOUS | Status: AC
  Administered 2020-12-04: 10 mg via INTRAVENOUS
  Filled 2020-12-04: qty 10

## 2020-12-04 MED ORDER — FAMOTIDINE IN NACL 20-0.9 MG/50ML-% IV SOLN
20.0000 mg | Freq: Once | INTRAVENOUS | Status: AC
Start: 2020-12-04 — End: 2020-12-04
  Administered 2020-12-04: 20 mg via INTRAVENOUS

## 2020-12-04 MED ORDER — ONDANSETRON HCL 4 MG/2ML IJ SOLN
8.0000 mg | Freq: Once | INTRAMUSCULAR | Status: AC
  Administered 2020-12-04: 8 mg via INTRAVENOUS

## 2020-12-04 MED ORDER — DIPHENHYDRAMINE HCL 50 MG/ML IJ SOLN
25.0000 mg | Freq: Once | INTRAMUSCULAR | Status: AC
  Administered 2020-12-04: 25 mg via INTRAVENOUS

## 2020-12-04 NOTE — Telephone Encounter (Signed)
Received call from pt with complaint of vomiting after eating leftover [place] food this weekend.  Pt states she called the after hours line and was prescribed Zofran ODT which has helped.  Pt states she is weak but is able to drink oral fluids now.  Pt coming in today for tx. Per needing 1 L NS added to tx today.  Orders placed and infusion notified.

## 2020-12-04 NOTE — Progress Notes (Signed)
Per Dr. Lindi Adie, okay to treat with today's labs.

## 2020-12-04 NOTE — Progress Notes (Signed)
Reducing the dosage of Taxol because of elevated LFTs

## 2020-12-04 NOTE — Patient Instructions (Addendum)
[place] Discharge Instructions for Patients Receiving Chemotherapy  Today you received the following chemotherapy agents: Taxol.  To help prevent nausea and vomiting after your treatment, we encourage you to take your nausea medication as directed.   If you develop nausea and vomiting that is not controlled by your nausea medication, call the clinic.   BELOW ARE SYMPTOMS THAT SHOULD BE REPORTED IMMEDIATELY:  *FEVER GREATER THAN 100.5 F  *CHILLS WITH OR WITHOUT FEVER  NAUSEA AND VOMITING THAT IS NOT CONTROLLED WITH YOUR NAUSEA MEDICATION  *UNUSUAL SHORTNESS OF BREATH  *UNUSUAL BRUISING OR BLEEDING  TENDERNESS IN MOUTH AND THROAT WITH OR WITHOUT PRESENCE OF ULCERS  *URINARY PROBLEMS  *BOWEL PROBLEMS  UNUSUAL RASH Items with * indicate a potential emergency and should be followed up as soon as possible.  Feel free to call the clinic should you have any questions or concerns. The clinic phone number is [phone].  Please show the [place] at check-in to the Emergency Department and triage nurse.   Rehydration, Adult Rehydration is the replacement of body fluids, salts, and minerals (electrolytes) that are lost during dehydration. Dehydration is when there is not enough water or other fluids in the body. This happens when you lose more fluids than you take in. Common causes of dehydration include:  Not drinking enough fluids. This can occur when you are ill or doing activities that require a lot of energy, especially in hot weather.  Conditions that cause loss of water or other fluids, such as diarrhea, vomiting, sweating, or urinating a lot.  Other illnesses, such as fever or infection.  Certain medicines, such as those that remove excess fluid from the body (diuretics). Symptoms of mild or moderate dehydration may include thirst, dry lips and mouth, and dizziness. Symptoms of severe dehydration may include increased heart rate, confusion,  fainting, and not urinating. For severe dehydration, you may need to get fluids through an IV at the hospital. For mild or moderate dehydration, you can usually rehydrate at home by drinking certain fluids as told by your health care provider. What are the risks? Generally, rehydration is safe. However, taking in too much fluid (overhydration) can be a problem. This is rare. Overhydration can cause an electrolyte imbalance, kidney failure, or a decrease in salt (sodium) levels in the body. Supplies needed You will need an oral rehydration solution (ORS) if your health care provider tells you to use one. This is a drink to treat dehydration. It can be found in pharmacies and retail stores. How to rehydrate Fluids Follow instructions from your health care provider for rehydration. The kind of fluid and the amount you should drink depend on your condition. In general, you should choose drinks that you prefer.  If told by your health care provider, drink an ORS. ? Make an ORS by following instructions on the package. ? Start by drinking small amounts, about  cup (120 mL) every 5-10 minutes. ? Slowly increase how much you drink until you have taken the amount recommended by your health care provider.  Drink enough clear fluids to keep your urine pale yellow. If you were told to drink an ORS, finish it first, then start slowly drinking other clear fluids. Drink fluids such as: ? Water. This includes sparkling water and flavored water. Drinking only water can lead to having too [place] sodium in your body (hyponatremia). Follow the advice of your health care provider. ? Water from ice chips you suck on. ? Fruit juice  with water you add to it (diluted). ? Sports drinks. ? Hot or cold herbal teas. ? Broth-based soups. ? Milk or milk products. Food Follow instructions from your health care provider about what to eat while you rehydrate. Your health care provider may recommend that you slowly begin  eating regular foods in small amounts.  Eat foods that contain a healthy balance of electrolytes, such as bananas, oranges, potatoes, tomatoes, and spinach.  Avoid foods that are greasy or contain a lot of sugar. In some cases, you may get nutrition through a feeding tube that is passed through your nose and into your stomach (nasogastric tube, or NG tube). This may be done if you have uncontrolled vomiting or diarrhea.   Beverages to avoid Certain beverages may make dehydration worse. While you rehydrate, avoid drinking alcohol.   How to tell if you are recovering from dehydration You may be recovering from dehydration if:  You are urinating more often than before you started rehydrating.  Your urine is pale yellow.  Your energy level improves.  You vomit less frequently.  You have diarrhea less frequently.  Your appetite improves or returns to normal.  You feel less dizzy or less light-headed.  Your skin tone and color start to look more normal. Follow these instructions at home:  Take over-the-counter and prescription medicines only as told by your health care provider.  Do not take sodium tablets. Doing this can lead to having too much sodium in your body (hypernatremia). Contact a health care provider if:  You continue to have symptoms of mild or moderate dehydration, such as: ? Thirst. ? Dry lips. ? Slightly dry mouth. ? Dizziness. ? Dark urine or less urine than normal. ? Muscle cramps.  You continue to vomit or have diarrhea. Get help right away if you:  Have symptoms of dehydration that get worse.  Have a fever.  Have a severe headache.  Have been vomiting and the following happens: ? Your vomiting gets worse or does not go away. ? Your vomit includes blood or green matter (bile). ? You cannot eat or drink without vomiting.  Have problems with urination or bowel movements, such as: ? Diarrhea that gets worse or does not go away. ? Blood in your stool  (feces). This may cause stool to look black and tarry. ? Not urinating, or urinating only a small amount of very dark urine, within 6-8 hours.  Have trouble breathing.  Have symptoms that get worse with treatment. These symptoms may represent a serious problem that is an emergency. Do not wait to see if the symptoms will go away. Get medical help right away. Call your local emergency services (911 in the [place] Do not drive yourself to the hospital. Summary  Rehydration is the replacement of body fluids and minerals (electrolytes) that are lost during dehydration.  Follow instructions from your health care provider for rehydration. The kind of fluid and amount you should drink depend on your condition.  Slowly increase how much you drink until you have taken the amount recommended by your health care provider.  Contact your health care provider if you continue to show signs of mild or moderate dehydration. This information is not intended to replace advice given to you by your health care provider. Make sure you discuss any questions you have with your health care provider. Document Revised: 01/12/2020 Document Reviewed: 11/22/2019 Elsevier Patient Education  2021 [employer].

## 2020-12-05 ENCOUNTER — Inpatient Hospital Stay: Payer: 59

## 2020-12-05 ENCOUNTER — Other Ambulatory Visit: Payer: Self-pay | Admitting: *Deleted

## 2020-12-05 ENCOUNTER — Other Ambulatory Visit: Payer: Self-pay

## 2020-12-05 ENCOUNTER — Inpatient Hospital Stay (HOSPITAL_BASED_OUTPATIENT_CLINIC_OR_DEPARTMENT_OTHER): Payer: 59 | Admitting: Hematology and Oncology

## 2020-12-05 DIAGNOSIS — C50512 Malignant neoplasm of lower-outer quadrant of left female breast: Secondary | ICD-10-CM

## 2020-12-05 DIAGNOSIS — Z170 Estrogen receptor positive status [ER+]: Secondary | ICD-10-CM

## 2020-12-05 LAB — CMP (CANCER CENTER ONLY)
ALT: 126 U/L — ABNORMAL HIGH (ref 0–44)
AST: 48 U/L — ABNORMAL HIGH (ref 15–41)
Albumin: 3.6 g/dL (ref 3.5–5.0)
Alkaline Phosphatase: 77 U/L (ref 38–126)
Anion gap: 7 (ref 5–15)
BUN: 13 mg/dL (ref 6–20)
CO2: 24 mmol/L (ref 22–32)
Calcium: 9.1 mg/dL (ref 8.9–10.3)
Chloride: 101 mmol/L (ref 98–111)
Creatinine: 0.7 mg/dL (ref 0.44–1.00)
GFR, Estimated: >60 mL/min (ref >60)
Glucose, Bld: 141 mg/dL — ABNORMAL HIGH (ref 70–99)
Potassium: 3.5 mmol/L (ref 3.5–5.1)
Sodium: 132 mmol/L — ABNORMAL LOW (ref 135–145)
Total Bilirubin: 2 mg/dL — ABNORMAL HIGH (ref 0.3–1.2)
Total Protein: 7 g/dL (ref 6.5–8.1)

## 2020-12-05 LAB — CBC WITH DIFFERENTIAL (CANCER CENTER ONLY)
Abs Immature Granulocytes: 0.04 10*3/uL (ref 0.00–0.07)
Basophils Absolute: 0 10*3/uL (ref 0.0–0.1)
Basophils Relative: 0 %
Eosinophils Absolute: 0 10*3/uL (ref 0.0–0.5)
Eosinophils Relative: 0 %
HCT: 30.1 % — ABNORMAL LOW (ref 36.0–46.0)
Hemoglobin: 10.4 g/dL — ABNORMAL LOW (ref 12.0–15.0)
Immature Granulocytes: 1 %
Lymphocytes Relative: 5 %
Lymphs Abs: 0.4 10*3/uL — ABNORMAL LOW (ref 0.7–4.0)
MCH: 33.2 pg (ref 26.0–34.0)
MCHC: 34.6 g/dL (ref 30.0–36.0)
MCV: 96.2 fL (ref 80.0–100.0)
Monocytes Absolute: 0.2 10*3/uL (ref 0.1–1.0)
Monocytes Relative: 3 %
Neutro Abs: 7.5 10*3/uL (ref 1.7–7.7)
Neutrophils Relative %: 91 %
Platelet Count: 256 10*3/uL (ref 150–400)
RBC: 3.13 MIL/uL — ABNORMAL LOW (ref 3.87–5.11)
RDW: 16.3 % — ABNORMAL HIGH (ref 11.5–15.5)
WBC Count: 8.2 10*3/uL (ref 4.0–10.5)
nRBC: 0 % (ref 0.0–0.2)

## 2020-12-05 NOTE — Progress Notes (Signed)
Received call from pt with complaint of abdominal bloating x1 day.  Pt states on Saturday she experienced severe vomiting throughout the day related to eating leftovers.  Per MD pt total bili and AST were elevated prior to tx yesterday and pt needing to be seen today with lab work done before.  Apt scheduled and pt verbalized understanding of date and time.

## 2020-12-05 NOTE — Progress Notes (Signed)
 Patient Care Team: Murray, Laura Woodruff, FNP as PCP - General (Internal Medicine) Martini, Keisha N, RN as Oncology Nurse Navigator Stuart, Dawn C, RN as Oncology Nurse Navigator Newman, David, MD as Consulting Physician (General Surgery) , , MD as Consulting Physician (Hematology and Oncology) Moody, John, MD as Consulting Physician (Radiation Oncology)  DIAGNOSIS:  Encounter Diagnosis  Name Primary?  . Malignant neoplasm of lower-outer quadrant of left breast of female, estrogen receptor positive (HCC)     SUMMARY OF ONCOLOGIC HISTORY: Oncology History  Malignant neoplasm of lower-outer quadrant of left breast of female, estrogen receptor positive (HCC)  07/20/2020 Initial Diagnosis   Screening mammogram detected left breast calcifications, enlarged left axillary lymph nodes, and an asymmetry in the right breast. Diagnostic mammogram showed in the left breast, a 2.4cm mass with calcifications, 3:30 position, a left axillary lymph node with cortical thickness, and in the right breast, no suspicious masses or calcifications. Left breast biopsy showed invasive and in situ mammary carcinoma in the breast and axilla, grade 2, HER-2 equivocal by IHC (2+), negative by FISH, ER+ 95%, PR+40%, Ki67 15%.    07/26/2020 Cancer Staging   Staging form: Breast, AJCC 8th Edition - Clinical stage from 07/26/2020: Stage IIA (cT2, cN1(f), cM0, G2, ER+, PR+, HER2-) - Signed by , , MD on 07/26/2020   08/01/2020 Genetic Testing   No pathogenic variants detected in [facility] Multi-Cancer Panel.  The Multi-Cancer Panel offered by [facility] includes sequencing and/or deletion duplication testing of the following 85 genes: AIP, ALK, APC, ATM, AXIN2,BAP1,  BARD1, BLM, BMPR1A, BRCA1, BRCA2, BRIP1, CASR, CDC73, CDH1, CDK4, CDKN1B, CDKN1C, CDKN2A (p14ARF), CDKN2A (p16INK4a), CEBPA, CHEK2, CTNNA1, DICER1, DIS3L2, EGFR (c.2369C>T, p.Thr790Met variant only), EPCAM (Deletion/duplication testing only), FH,  FLCN, GATA2, GPC3, GREM1 (Promoter region deletion/duplication testing only), HOXB13 (c.251G>A, p.Gly84Glu), HRAS, KIT, MAX, MEN1, MET, MITF (c.952G>A, p.Glu318Lys variant only), MLH1, MSH2, MSH3, MSH6, MUTYH, NBN, NF1, NF2, NTHL1, PALB2, PDGFRA, PHOX2B, PMS2, POLD1, POLE, POT1, PRKAR1A, PTCH1, PTEN, RAD50, RAD51C, RAD51D, RB1, RECQL4, RET, RNF43, RUNX1, SDHAF2, SDHA (sequence changes only), SDHB, SDHC, SDHD, SMAD4, SMARCA4, SMARCB1, SMARCE1, STK11, SUFU, TERC, TERT, TMEM127, TP53, TSC1, TSC2, VHL, WRN and WT1.  The report date is August 01, 2020.    08/16/2020 Surgery   Left lumpectomy ([facility]): IDC, grade 2, 2.3cm, with intermediate grade DCIS, clear margins, 1/2 left axillary lymph nodes positive for carcinoma.HER-2 equivocal by IHC (2+), negative by FISH, ER+ 95%, PR+40%, Ki67 15%.    08/24/2020 Cancer Staging   Staging form: Breast, AJCC 8th Edition - Pathologic stage from 08/24/2020: Stage IB (pT2, pN1a, cM0, G2, ER+, PR+, HER2-) - Signed by , , MD on 08/24/2020   08/30/2020 Miscellaneous   MammaPrint high risk   09/13/2020 -  Chemotherapy    Patient is on Treatment Plan: BREAST ADJUVANT DOSE DENSE AC Q14D / PACLITAXEL Q7D        CHIEF COMPLIANT: Severe bloating sensation in the abdomen, poor appetite  INTERVAL HISTORY: Shravya B Zecca is a 55-year-old with above-mentioned history of left breast cancer treated with lumpectomy and is now on adjuvant chemotherapy.   She received her fifth cycle of Taxol yesterday.  Yesterday she was complaining that over the weekend she got very sick with a profound nausea and vomiting on Saturday.  It finally subsided with Zofran on Sunday.  But since then she has been feeling bloated and having difficulty with eating food.  The nausea has subsided.  She received IV fluids yesterday and felt remarkably better.  She was   noted to have elevation of liver function tests especially the bilirubin and the ALT.  She tells me that she ate some leftover  [place] food on the night before she got sick and she attributes most of her symptoms to that.  She did not have any adverse effects to the first 4 cycles of Taxol.   ALLERGIES:  has No Known Allergies.  MEDICATIONS:  Current Outpatient Medications  Medication Sig Dispense Refill  . ALPRAZolam (XANAX) 0.25 MG tablet Take 1 tablet (0.25 mg total) by mouth 2 (two) times daily as needed for anxiety. 60 tablet 3  . lidocaine-prilocaine (EMLA) cream Apply to affected area once (Patient taking differently: Apply 1 application topically as needed. Apply to affected area once) 30 g 3  . metroNIDAZOLE (METROCREAM) 0.75 % cream Apply 1 application topically once a week. Apply to face daily    . ondansetron (ZOFRAN ODT) 8 MG disintegrating tablet Take 1 tablet (8 mg total) by mouth every 8 (eight) hours as needed for nausea or vomiting. 20 tablet 1  . prochlorperazine (COMPAZINE) 10 MG tablet Take 1 tablet (10 mg total) by mouth every 6 (six) hours as needed (Nausea or vomiting). 30 tablet 1   No current facility-administered medications for this visit.    PHYSICAL EXAMINATION: ECOG PERFORMANCE STATUS: 1 - Symptomatic but completely ambulatory  Vitals:   12/05/20 1302  BP: 136/84  Pulse: 100  Resp: 18  Temp: 97.8 F (36.6 C)  SpO2: 99%   Filed Weights   12/05/20 1302  Weight: 217 lb 12.8 oz (98.8 kg)      LABORATORY DATA:  I have reviewed the data as listed CMP Latest Ref Rng & Units 12/04/2020 11/28/2020 11/20/2020  Glucose 70 - 99 mg/dL 110(H) 103(H) 92  BUN 6 - 20 mg/dL 16 13 17  Creatinine 0.44 - 1.00 mg/dL 0.72 0.77 0.77  Sodium 135 - 145 mmol/L 134(L) 140 138  Potassium 3.5 - 5.1 mmol/L 3.5 4.3 4.0  Chloride 98 - 111 mmol/L 101 111 107  CO2 22 - 32 mmol/L 24 23 25  Calcium 8.9 - 10.3 mg/dL 9.3 9.6 9.5  Total Protein 6.5 - 8.1 g/dL 7.3 6.7 6.9  Total Bilirubin 0.3 - 1.2 mg/dL 2.3(H) 0.6 0.5  Alkaline Phos 38 - 126 U/L 96 51 44  AST 15 - 41 U/L 57(H) 32 33  ALT 0 - 44 U/L  154(H) 48(H) 46(H)    Lab Results  Component Value Date   WBC 8.2 12/05/2020   HGB 10.4 (L) 12/05/2020   HCT 30.1 (L) 12/05/2020   MCV 96.2 12/05/2020   PLT 256 12/05/2020   NEUTROABS 7.5 12/05/2020    ASSESSMENT & PLAN:  Malignant neoplasm of lower-outer quadrant of left breast of female, estrogen receptor positive (HCC) 08/16/2020:Left lumpectomy ([facility]): IDC, grade 2, 2.3cm, with intermediate grade DCIS, clear margins, 1/2 left axillary lymph nodes positive for carcinoma.HER-2 equivocal by IHC (2+), negative by FISH, ER+ 95%, PR+40%, Ki67 15%. T2 N1 M0 stage Ib MammaPrint: High risk (average 10-year risk untreated: 29%, predicted benefit of chemo with hormone therapy at 5 years: 94.6%  Treatment plan: 1.Adjuvant chemotherapy withdose dense Adriamycin and Cytoxan x4 followed by Taxol x12 2.Followed by adjuvant radiation 3.Followed byadjuvant antiestrogen therapy ------------------------------------------------------------------------------------------------------------------------------------ Current treatment:Completed 4 cycles ofdose Adriamycin and Cytoxan, today cycle 4 Taxol Echocardiogram 09/08/2020: EF 60 to 65%  Chemo toxicities:  1.  Severe nausea and vomiting: Unclear if it is related to chemotherapy or the [place] food that she ate.    Nausea has subsided currently. 2. abdominal distention and bloating: Tenderness is in the epigastric region and duodenum.  There is no Murphy sign.  I discussed with her that it is better to stay on a bland and liquid diet for another 24 hours.  She felt significantly better after receiving IV fluids.  So we will schedule her weekly for IV fluids on Thursdays.    Return to clinic weekly for Taxol every other week for follow-up with us     No orders of the defined types were placed in this encounter.  The patient has a good understanding of the overall plan. she agrees with it. she will call with any problems that may  develop before the next visit here. Total time spent: 30 mins including face to face time and time spent for planning, charting and co-ordination of care   Viinay K , MD 12/05/20    

## 2020-12-05 NOTE — Assessment & Plan Note (Signed)
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 T2 N1 M0 stage Ib MammaPrint: High risk (average 10-year risk untreated: 29%, predicted benefit of chemo with hormone therapy at 5 years: 94.6%  Treatment plan: 1.Adjuvant chemotherapy withdose dense Adriamycin and Cytoxan x4 followed by Taxol x12 2.Followed by adjuvant radiation 3.Followed byadjuvant antiestrogen therapy ------------------------------------------------------------------------------------------------------------------------------------ Current treatment:Completed 4 cycles ofdose Adriamycin and Cytoxan, today cycle 4 Taxol Echocardiogram 09/08/2020: EF 60 to 65%  Chemo toxicities:  1.  Severe nausea and vomiting: Unclear if it is related to chemotherapy or the [place] food that she ate.  Nausea has subsided currently. 2. abdominal distention and bloating: Tenderness is in the epigastric region and duodenum.  There is no Murphy sign.  I discussed with her that it is better to stay on a bland and liquid diet for another 24 hours.  She felt significantly better after receiving IV fluids.  So we will schedule her weekly for IV fluids on Thursdays.    Return to clinic weekly for Taxol every other week for follow-up with [place]

## 2020-12-08 ENCOUNTER — Inpatient Hospital Stay: Payer: 59

## 2020-12-08 ENCOUNTER — Other Ambulatory Visit: Payer: Self-pay

## 2020-12-08 VITALS — BP 111/83 | HR 86 | Temp 98.5°F | Resp 18

## 2020-12-08 DIAGNOSIS — Z170 Estrogen receptor positive status [ER+]: Secondary | ICD-10-CM

## 2020-12-08 DIAGNOSIS — Z95828 Presence of other vascular implants and grafts: Secondary | ICD-10-CM

## 2020-12-08 DIAGNOSIS — C50512 Malignant neoplasm of lower-outer quadrant of left female breast: Secondary | ICD-10-CM

## 2020-12-08 MED ORDER — SODIUM CHLORIDE 0.9 % IV SOLN
Freq: Once | INTRAVENOUS | Status: AC
  Filled 2020-12-08: qty 250

## 2020-12-08 MED ORDER — HEPARIN SOD (PORK) LOCK FLUSH 100 UNIT/ML IV SOLN
500.0000 [IU] | Freq: Once | INTRAVENOUS | Status: AC
  Administered 2020-12-08: 500 [IU]
  Filled 2020-12-08: qty 5

## 2020-12-08 MED ORDER — SODIUM CHLORIDE 0.9% FLUSH
10.0000 mL | Freq: Once | INTRAVENOUS | Status: AC
  Administered 2020-12-08: 10 mL
  Filled 2020-12-08: qty 10

## 2020-12-08 NOTE — Assessment & Plan Note (Signed)
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 T2 N1 M0 stage Ib MammaPrint: High risk (average 10-year risk untreated: 29%, predicted benefit of chemo with hormone therapy at 5 years: 94.6%  Treatment plan: 1.Adjuvant chemotherapy withdose dense Adriamycin and Cytoxan x4 followed by Taxol x12 2.Followed by adjuvant radiation 3.Followed byadjuvant antiestrogen therapy ------------------------------------------------------------------------------------------------------------------------------------ Current treatment:Completed 4 cycles ofdose Adriamycin and Cytoxan, today cycle6Taxol Echocardiogram 09/08/2020: EF 60 to 65%  Chemo toxicities:  1.  Severe nausea and vomiting:   2. abdominal distention and bloating:    She felt significantly better after receiving IV fluids.  So we will schedule her weekly for IV fluids on Thursdays.    Return to clinicweekly for Taxol every other week for follow-up with [place]

## 2020-12-08 NOTE — Patient Instructions (Signed)
Rehydration, Adult Rehydration is the replacement of body fluids, salts, and minerals (electrolytes) that are lost during dehydration. Dehydration is when there is not enough water or other fluids in the body. This happens when you lose more fluids than you take in. Common causes of dehydration include:  Not drinking enough fluids. This can occur when you are ill or doing activities that require a lot of energy, especially in hot weather.  Conditions that cause loss of water or other fluids, such as diarrhea, vomiting, sweating, or urinating a lot.  Other illnesses, such as fever or infection.  Certain medicines, such as those that remove excess fluid from the body (diuretics). Symptoms of mild or moderate dehydration may include thirst, dry lips and mouth, and dizziness. Symptoms of severe dehydration may include increased heart rate, confusion, fainting, and not urinating. For severe dehydration, you may need to get fluids through an IV at the hospital. For mild or moderate dehydration, you can usually rehydrate at home by drinking certain fluids as told by your health care provider. What are the risks? Generally, rehydration is safe. However, taking in too much fluid (overhydration) can be a problem. This is rare. Overhydration can cause an electrolyte imbalance, kidney failure, or a decrease in salt (sodium) levels in the body. Supplies needed You will need an oral rehydration solution (ORS) if your health care provider tells you to use one. This is a drink to treat dehydration. It can be found in pharmacies and retail stores. How to rehydrate Fluids Follow instructions from your health care provider for rehydration. The kind of fluid and the amount you should drink depend on your condition. In general, you should choose drinks that you prefer.  If told by your health care provider, drink an ORS. ? Make an ORS by following instructions on the package. ? Start by drinking small amounts,  about  cup (120 mL) every 5-10 minutes. ? Slowly increase how much you drink until you have taken the amount recommended by your health care provider.  Drink enough clear fluids to keep your urine pale yellow. If you were told to drink an ORS, finish it first, then start slowly drinking other clear fluids. Drink fluids such as: ? Water. This includes sparkling water and flavored water. Drinking only water can lead to having too Yang sodium in your body (hyponatremia). Follow the advice of your health care provider. ? Water from ice chips you suck on. ? Fruit juice with water you add to it (diluted). ? Sports drinks. ? Hot or cold herbal teas. ? Broth-based soups. ? Milk or milk products. Food Follow instructions from your health care provider about what to eat while you rehydrate. Your health care provider may recommend that you slowly begin eating regular foods in small amounts.  Eat foods that contain a healthy balance of electrolytes, such as bananas, oranges, potatoes, tomatoes, and spinach.  Avoid foods that are greasy or contain a lot of sugar. In some cases, you may get nutrition through a feeding tube that is passed through your nose and into your stomach (nasogastric tube, or NG tube). This may be done if you have uncontrolled vomiting or diarrhea.   Beverages to avoid Certain beverages may make dehydration worse. While you rehydrate, avoid drinking alcohol.   How to tell if you are recovering from dehydration You may be recovering from dehydration if:  You are urinating more often than before you started rehydrating.  Your urine is pale yellow.  Your energy level   improves.  You vomit less frequently.  You have diarrhea less frequently.  Your appetite improves or returns to normal.  You feel less dizzy or less light-headed.  Your skin tone and color start to look more normal. Follow these instructions at home:  Take over-the-counter and prescription medicines only  as told by your health care provider.  Do not take sodium tablets. Doing this can lead to having too much sodium in your body (hypernatremia). Contact a health care provider if:  You continue to have symptoms of mild or moderate dehydration, such as: ? Thirst. ? Dry lips. ? Slightly dry mouth. ? Dizziness. ? Dark urine or less urine than normal. ? Muscle cramps.  You continue to vomit or have diarrhea. Get help right away if you:  Have symptoms of dehydration that get worse.  Have a fever.  Have a severe headache.  Have been vomiting and the following happens: ? Your vomiting gets worse or does not go away. ? Your vomit includes blood or green matter (bile). ? You cannot eat or drink without vomiting.  Have problems with urination or bowel movements, such as: ? Diarrhea that gets worse or does not go away. ? Blood in your stool (feces). This may cause stool to look black and tarry. ? Not urinating, or urinating only a small amount of very dark urine, within 6-8 hours.  Have trouble breathing.  Have symptoms that get worse with treatment. These symptoms may represent a serious problem that is an emergency. Do not wait to see if the symptoms will go away. Get medical help right away. Call your local emergency services (911 in the [place] Do not drive yourself to the hospital. Summary  Rehydration is the replacement of body fluids and minerals (electrolytes) that are lost during dehydration.  Follow instructions from your health care provider for rehydration. The kind of fluid and amount you should drink depend on your condition.  Slowly increase how much you drink until you have taken the amount recommended by your health care provider.  Contact your health care provider if you continue to show signs of mild or moderate dehydration. This information is not intended to replace advice given to you by your health care provider. Make sure you discuss any questions you have with  your health care provider. Document Revised: 01/12/2020 Document Reviewed: 11/22/2019 Elsevier Patient Education  2021 Elsevier Inc.  

## 2020-12-10 NOTE — Progress Notes (Signed)
Patient Care Team: [name], [place] as PCP - General (Internal Medicine) [name], RN as Oncology Nurse Navigator [name], RN as Oncology Nurse Navigator [name], MD as Consulting Physician (General Surgery) [name], MD as Consulting Physician (Hematology and Oncology) Kyung Rudd, MD as Consulting Physician (Radiation Oncology)  DIAGNOSIS:    ICD-10-CM   1. Malignant neoplasm of lower-outer quadrant of left breast of female, estrogen receptor positive ([place])  C50.512    Z17.0     SUMMARY OF ONCOLOGIC HISTORY: Oncology History  Malignant neoplasm of lower-outer quadrant of left breast of female, estrogen receptor positive ([place])  07/20/2020 Initial Diagnosis   Screening mammogram detected left breast calcifications, enlarged left axillary lymph nodes, and an asymmetry in the right breast. Diagnostic mammogram showed in the left breast, a 2.4cm mass with calcifications, 3:30 position, a left axillary lymph node with cortical thickness, and in the right breast, no suspicious masses or calcifications. Left breast biopsy showed invasive and in situ mammary carcinoma in the breast and axilla, grade 2, HER-2 equivocal by IHC (2+), negative by FISH, ER+ 95%, PR+40%, Ki67 15%.    07/26/2020 Cancer Staging   Staging form: Breast, AJCC 8th Edition - Clinical stage from 07/26/2020: Stage IIA (cT2, cN1(f), cM0, G2, ER+, PR+, HER2-) - Signed by [name], MD on 07/26/2020   08/01/2020 Genetic Testing   No pathogenic variants detected in [facility] Multi-Cancer Panel.  The Multi-Cancer Panel offered by [facility] includes sequencing and/or deletion duplication testing of the following 85 genes: AIP, ALK, APC, ATM, AXIN2,BAP1,  BARD1, BLM, BMPR1A, BRCA1, BRCA2, BRIP1, CASR, CDC73, CDH1, CDK4, CDKN1B, CDKN1C, CDKN2A (p14ARF), CDKN2A (p16INK4a), CEBPA, CHEK2, CTNNA1, DICER1, DIS3L2, EGFR (c.2369C>T, p.Thr790Met variant only), EPCAM (Deletion/duplication testing only), FH, FLCN,  GATA2, GPC3, GREM1 (Promoter region deletion/duplication testing only), HOXB13 (c.251G>A, p.Gly84Glu), HRAS, KIT, MAX, MEN1, MET, MITF (c.952G>A, p.Glu318Lys variant only), MLH1, MSH2, MSH3, MSH6, MUTYH, NBN, NF1, NF2, NTHL1, PALB2, PDGFRA, PHOX2B, PMS2, POLD1, POLE, POT1, PRKAR1A, PTCH1, PTEN, RAD50, RAD51C, RAD51D, RB1, RECQL4, RET, RNF43, RUNX1, SDHAF2, SDHA (sequence changes only), SDHB, SDHC, SDHD, SMAD4, SMARCA4, SMARCB1, SMARCE1, STK11, SUFU, TERC, TERT, TMEM127, TP53, TSC1, TSC2, VHL, WRN and WT1.  The report date is August 01, 2020.    08/16/2020 Surgery   Left lumpectomy [name]): IDC, grade 2, 2.3cm, with intermediate grade DCIS, clear margins, 1/2 left axillary lymph nodes positive for carcinoma.HER-2 equivocal by IHC (2+), negative by FISH, ER+ 95%, PR+40%, Ki67 15%.    08/24/2020 Cancer Staging   Staging form: Breast, AJCC 8th Edition - Pathologic stage from 08/24/2020: Stage IB (pT2, pN1a, cM0, G2, ER+, PR+, HER2-) - Signed by [name], MD on 08/24/2020   08/30/2020 Miscellaneous   MammaPrint high risk   09/13/2020 -  Chemotherapy    Patient is on Treatment Plan: BREAST ADJUVANT DOSE DENSE AC Q14D / PACLITAXEL Q7D        CHIEF COMPLIANT: Cycle 6 Taxol  INTERVAL HISTORY: Lindsay Hampton is a 55 y.o. with above-mentioned history of left breast cancer treated with lumpectomy and who is currently on adjuvant chemotherapy with weekly Taxol after completing 4 cycles of Adriamycin and Cytoxan. She presents to the clinic today for treatment.  She reports to me that her abdominal symptoms have improved markedly.  She no longer feels bloated or uncomfortable in the tummy.  Denies any nausea or vomiting.  Denies neuropathy.  ALLERGIES:  has No Known Allergies.  MEDICATIONS:  Current Outpatient Medications  Medication Sig Dispense Refill  .  ALPRAZolam (XANAX) 0.25 MG tablet Take 1 tablet (0.25 mg total) by mouth 2 (two) times daily as needed for anxiety. 60 tablet 3  .  lidocaine-prilocaine (EMLA) cream Apply to affected area once (Patient taking differently: Apply 1 application topically as needed. Apply to affected area once) 30 g 3  . metroNIDAZOLE (METROCREAM) 0.75 % cream Apply 1 application topically once a week. Apply to face daily    . ondansetron (ZOFRAN ODT) 8 MG disintegrating tablet Take 1 tablet (8 mg total) by mouth every 8 (eight) hours as needed for nausea or vomiting. 20 tablet 1  . prochlorperazine (COMPAZINE) 10 MG tablet Take 1 tablet (10 mg total) by mouth every 6 (six) hours as needed (Nausea or vomiting). 30 tablet 1   No current facility-administered medications for this visit.    PHYSICAL EXAMINATION: ECOG PERFORMANCE STATUS: 1 - Symptomatic but completely ambulatory  Vitals:   12/11/20 1307  BP: 121/79  Resp: 15  Temp: 97.7 F (36.5 C)  SpO2: 100%   Filed Weights   12/11/20 1307  Weight: 224 lb 8 oz (101.8 kg)    LABORATORY DATA:  I have reviewed the data as listed CMP Latest Ref Rng & Units 12/11/2020 12/05/2020 12/04/2020  Glucose 70 - 99 mg/dL 119(H) 141(H) 110(H)  BUN 6 - 20 mg/dL _0 Creatinine 0.44 - 1.00 mg/dL 0.64 0.70 0.72  Sodium 135 - 145 mmol/L 137 132(L) 134(L)  Potassium 3.5 - 5.1 mmol/L 4.0 3.5 3.5  Chloride 98 - 111 mmol/L 106 101 101  CO2 22 - 32 mmol/L _1 Calcium 8.9 - 10.3 mg/dL 9.4 9.1 9.3  Total Protein 6.5 - 8.1 g/dL 6.9 7.0 7.3  Total Bilirubin 0.3 - 1.2 mg/dL 0.7 2.0(H) 2.3(H)  Alkaline Phos 38 - 126 U/L 70 77 96  AST 15 - 41 U/L 27 48(H) 57(H)  ALT 0 - 44 U/L 43 126(H) 154(H)    Lab Results  Component Value Date   WBC 5.8 12/11/2020   HGB 9.9 (L) 12/11/2020   HCT 28.1 (L) 12/11/2020   MCV 95.9 12/11/2020   PLT 390 12/11/2020   NEUTROABS 3.8 12/11/2020    ASSESSMENT & PLAN:  Malignant neoplasm of lower-outer quadrant of left breast of female, estrogen receptor positive ([place]) 08/16/2020:Left lumpectomy [name]): IDC, grade 2, 2.3cm, with intermediate grade DCIS, clear  margins, 1/2 left axillary lymph nodes positive for carcinoma.HER-2 equivocal by IHC (2+), negative by FISH, ER+ 95%, PR+40%, Ki67 15%. T2 N1 M0 stage Ib MammaPrint: High risk (average 10-year risk untreated: 29%, predicted benefit of chemo with hormone therapy at 5 years: 94.6%  Treatment plan: 1.Adjuvant chemotherapy withdose dense Adriamycin and Cytoxan x4 followed by Taxol x12 2.Followed by adjuvant radiation 3.Followed byadjuvant antiestrogen therapy ------------------------------------------------------------------------------------------------------------------------------------ Current treatment:Completed 4 cycles ofdose Adriamycin and Cytoxan, today cycle6Taxol Echocardiogram 09/08/2020: EF 60 to 65%  Chemo toxicities:  1.  Severe nausea and vomiting: Resolved 2. abdominal distention and bloating: Resolved 3. elevated LFTs: Normalized  4. chemo induced anemia: Monitoring today's hemoglobin is 9.9   Return to clinicweekly for Taxol every other week for follow-up with [place]    No orders of the defined types were placed in this encounter.  The patient has a good understanding of the overall plan. she agrees with it. she will call with any problems that may develop before the next visit here.  Total time spent: 30 mins including face to face time and time spent for planning, charting and coordination of care  I, Molly Dorshimer, am acting as scribe for Dr. [name].  I have reviewed the above documentation for accuracy and completeness, and I agree with the above.

## 2020-12-11 ENCOUNTER — Inpatient Hospital Stay: Payer: 59

## 2020-12-11 ENCOUNTER — Ambulatory Visit: Payer: 59

## 2020-12-11 ENCOUNTER — Inpatient Hospital Stay (HOSPITAL_BASED_OUTPATIENT_CLINIC_OR_DEPARTMENT_OTHER): Payer: 59 | Admitting: Hematology and Oncology

## 2020-12-11 ENCOUNTER — Other Ambulatory Visit: Payer: Self-pay

## 2020-12-11 ENCOUNTER — Encounter: Payer: Self-pay | Admitting: *Deleted

## 2020-12-11 VITALS — HR 99

## 2020-12-11 DIAGNOSIS — C50512 Malignant neoplasm of lower-outer quadrant of left female breast: Secondary | ICD-10-CM

## 2020-12-11 DIAGNOSIS — Z170 Estrogen receptor positive status [ER+]: Secondary | ICD-10-CM | POA: Diagnosis not present

## 2020-12-11 DIAGNOSIS — Z95828 Presence of other vascular implants and grafts: Secondary | ICD-10-CM

## 2020-12-11 LAB — CMP (CANCER CENTER ONLY)
ALT: 43 U/L (ref 0–44)
AST: 27 U/L (ref 15–41)
Albumin: 3.2 g/dL — ABNORMAL LOW (ref 3.5–5.0)
Alkaline Phosphatase: 70 U/L (ref 38–126)
Anion gap: 8 (ref 5–15)
BUN: 10 mg/dL (ref 6–20)
CO2: 23 mmol/L (ref 22–32)
Calcium: 9.4 mg/dL (ref 8.9–10.3)
Chloride: 106 mmol/L (ref 98–111)
Creatinine: 0.64 mg/dL (ref 0.44–1.00)
GFR, Estimated: >60 mL/min (ref >60)
Glucose, Bld: 119 mg/dL — ABNORMAL HIGH (ref 70–99)
Potassium: 4 mmol/L (ref 3.5–5.1)
Sodium: 137 mmol/L (ref 135–145)
Total Bilirubin: 0.7 mg/dL (ref 0.3–1.2)
Total Protein: 6.9 g/dL (ref 6.5–8.1)

## 2020-12-11 LAB — CBC WITH DIFFERENTIAL (CANCER CENTER ONLY)
Abs Immature Granulocytes: 0.08 10*3/uL — ABNORMAL HIGH (ref 0.00–0.07)
Basophils Absolute: 0.1 10*3/uL (ref 0.0–0.1)
Basophils Relative: 1 %
Eosinophils Absolute: 0.1 10*3/uL (ref 0.0–0.5)
Eosinophils Relative: 2 %
HCT: 28.1 % — ABNORMAL LOW (ref 36.0–46.0)
Hemoglobin: 9.9 g/dL — ABNORMAL LOW (ref 12.0–15.0)
Immature Granulocytes: 1 %
Lymphocytes Relative: 18 %
Lymphs Abs: 1 10*3/uL (ref 0.7–4.0)
MCH: 33.8 pg (ref 26.0–34.0)
MCHC: 35.2 g/dL (ref 30.0–36.0)
MCV: 95.9 fL (ref 80.0–100.0)
Monocytes Absolute: 0.7 10*3/uL (ref 0.1–1.0)
Monocytes Relative: 12 %
Neutro Abs: 3.8 10*3/uL (ref 1.7–7.7)
Neutrophils Relative %: 66 %
Platelet Count: 390 10*3/uL (ref 150–400)
RBC: 2.93 MIL/uL — ABNORMAL LOW (ref 3.87–5.11)
RDW: 15.7 % — ABNORMAL HIGH (ref 11.5–15.5)
WBC Count: 5.8 10*3/uL (ref 4.0–10.5)
nRBC: 0.5 % — ABNORMAL HIGH (ref 0.0–0.2)

## 2020-12-11 MED ORDER — ONDANSETRON HCL 4 MG/2ML IJ SOLN
8.0000 mg | Freq: Once | INTRAMUSCULAR | Status: AC
  Administered 2020-12-11: 8 mg via INTRAVENOUS

## 2020-12-11 MED ORDER — FAMOTIDINE IN NACL 20-0.9 MG/50ML-% IV SOLN
20.0000 mg | Freq: Once | INTRAVENOUS | Status: AC
  Administered 2020-12-11: 20 mg via INTRAVENOUS

## 2020-12-11 MED ORDER — FAMOTIDINE IN NACL 20-0.9 MG/50ML-% IV SOLN
INTRAVENOUS | Status: AC
  Filled 2020-12-11: qty 50

## 2020-12-11 MED ORDER — HEPARIN SOD (PORK) LOCK FLUSH 100 UNIT/ML IV SOLN
500.0000 [IU] | Freq: Once | INTRAVENOUS | Status: AC | PRN
  Administered 2020-12-11: 500 [IU]
  Filled 2020-12-11: qty 5

## 2020-12-11 MED ORDER — ONDANSETRON HCL 4 MG/2ML IJ SOLN
INTRAMUSCULAR | Status: AC
  Filled 2020-12-11: qty 4

## 2020-12-11 MED ORDER — DIPHENHYDRAMINE HCL 50 MG/ML IJ SOLN
INTRAMUSCULAR | Status: AC
  Filled 2020-12-11: qty 1

## 2020-12-11 MED ORDER — SODIUM CHLORIDE 0.9 % IV SOLN
10.0000 mg | Freq: Once | INTRAVENOUS | Status: AC
  Administered 2020-12-11: 10 mg via INTRAVENOUS
  Filled 2020-12-11: qty 10

## 2020-12-11 MED ORDER — SODIUM CHLORIDE 0.9 % IV SOLN
65.0000 mg/m2 | Freq: Once | INTRAVENOUS | Status: AC
  Administered 2020-12-11: 144 mg via INTRAVENOUS
  Filled 2020-12-11: qty 24

## 2020-12-11 MED ORDER — SODIUM CHLORIDE 0.9 % IV SOLN
Freq: Once | INTRAVENOUS | Status: AC
Start: 2020-12-11 — End: 2020-12-11
  Filled 2020-12-11: qty 250

## 2020-12-11 MED ORDER — SODIUM CHLORIDE 0.9% FLUSH
10.0000 mL | Freq: Once | INTRAVENOUS | Status: AC
  Administered 2020-12-11: 10 mL
  Filled 2020-12-11: qty 10

## 2020-12-11 MED ORDER — DIPHENHYDRAMINE HCL 50 MG/ML IJ SOLN
25.0000 mg | Freq: Once | INTRAMUSCULAR | Status: AC
  Administered 2020-12-11: 25 mg via INTRAVENOUS

## 2020-12-11 MED ORDER — SODIUM CHLORIDE 0.9% FLUSH
10.0000 mL | INTRAVENOUS | Status: DC | PRN
  Administered 2020-12-11: 10 mL
  Filled 2020-12-11: qty 10

## 2020-12-11 NOTE — Patient Instructions (Signed)
[facility] Discharge Instructions for Patients Receiving Chemotherapy  Today you received the following chemotherapy agents Paclitaxel.   To help prevent nausea and vomiting after your treatment, we encourage you to take your nausea medication as prescribed.   If you develop nausea and vomiting that is not controlled by your nausea medication, call the clinic.   BELOW ARE SYMPTOMS THAT SHOULD BE REPORTED IMMEDIATELY:  *FEVER GREATER THAN 100.5 F  *CHILLS WITH OR WITHOUT FEVER  NAUSEA AND VOMITING THAT IS NOT CONTROLLED WITH YOUR NAUSEA MEDICATION  *UNUSUAL SHORTNESS OF BREATH  *UNUSUAL BRUISING OR BLEEDING  TENDERNESS IN MOUTH AND THROAT WITH OR WITHOUT PRESENCE OF ULCERS  *URINARY PROBLEMS  *BOWEL PROBLEMS  UNUSUAL RASH Items with * indicate a potential emergency and should be followed up as soon as possible.  Feel free to call the clinic should you have any questions or concerns. The clinic phone number is [phone].  Please show the CHEMO ALERT CARD at check-in to the Emergency Department and triage nurse. 

## 2020-12-13 ENCOUNTER — Other Ambulatory Visit: Payer: Self-pay | Admitting: *Deleted

## 2020-12-13 MED ORDER — DEXAMETHASONE 4 MG PO TABS: 4.0000 mg | ORAL_TABLET | ORAL | 0 refills | Status: DC | PRN

## 2020-12-13 NOTE — Telephone Encounter (Signed)
Received call from pt with complaint of nausea x1 day.  Pt states nausea is relieved by Zofran ODT and Decadron 4 mg tablet. Pt requesting refill for Decadron.  Per MD okay to refill Decadron 4 mg tabled daily PRN for nausea.  Prescription sent to pharmacy on file.

## 2020-12-15 ENCOUNTER — Ambulatory Visit: Payer: 59

## 2020-12-18 ENCOUNTER — Inpatient Hospital Stay: Payer: 59

## 2020-12-18 ENCOUNTER — Other Ambulatory Visit: Payer: Self-pay

## 2020-12-18 ENCOUNTER — Ambulatory Visit: Payer: 59

## 2020-12-18 VITALS — BP 105/75 | HR 95 | Temp 98.1°F | Resp 18

## 2020-12-18 DIAGNOSIS — C50512 Malignant neoplasm of lower-outer quadrant of left female breast: Secondary | ICD-10-CM | POA: Diagnosis not present

## 2020-12-18 DIAGNOSIS — Z170 Estrogen receptor positive status [ER+]: Secondary | ICD-10-CM

## 2020-12-18 LAB — CBC WITH DIFFERENTIAL (CANCER CENTER ONLY)
Abs Immature Granulocytes: 0.15 10*3/uL — ABNORMAL HIGH (ref 0.00–0.07)
Basophils Absolute: 0.1 10*3/uL (ref 0.0–0.1)
Basophils Relative: 1 %
Eosinophils Absolute: 0.2 10*3/uL (ref 0.0–0.5)
Eosinophils Relative: 2 %
HCT: 30.4 % — ABNORMAL LOW (ref 36.0–46.0)
Hemoglobin: 10.2 g/dL — ABNORMAL LOW (ref 12.0–15.0)
Immature Granulocytes: 2 %
Lymphocytes Relative: 17 %
Lymphs Abs: 1.2 10*3/uL (ref 0.7–4.0)
MCH: 33.1 pg (ref 26.0–34.0)
MCHC: 33.6 g/dL (ref 30.0–36.0)
MCV: 98.7 fL (ref 80.0–100.0)
Monocytes Absolute: 0.5 10*3/uL (ref 0.1–1.0)
Monocytes Relative: 7 %
Neutro Abs: 5 10*3/uL (ref 1.7–7.7)
Neutrophils Relative %: 71 %
Platelet Count: 409 10*3/uL — ABNORMAL HIGH (ref 150–400)
RBC: 3.08 MIL/uL — ABNORMAL LOW (ref 3.87–5.11)
RDW: 15.5 % (ref 11.5–15.5)
WBC Count: 7 10*3/uL (ref 4.0–10.5)
nRBC: 0.7 % — ABNORMAL HIGH (ref 0.0–0.2)

## 2020-12-18 LAB — CMP (CANCER CENTER ONLY)
ALT: 65 U/L — ABNORMAL HIGH (ref 0–44)
AST: 32 U/L (ref 15–41)
Albumin: 3.3 g/dL — ABNORMAL LOW (ref 3.5–5.0)
Alkaline Phosphatase: 91 U/L (ref 38–126)
Anion gap: 8 (ref 5–15)
BUN: 14 mg/dL (ref 6–20)
CO2: 23 mmol/L (ref 22–32)
Calcium: 9 mg/dL (ref 8.9–10.3)
Chloride: 106 mmol/L (ref 98–111)
Creatinine: 0.81 mg/dL (ref 0.44–1.00)
GFR, Estimated: >60 mL/min (ref >60)
Glucose, Bld: 142 mg/dL — ABNORMAL HIGH (ref 70–99)
Potassium: 4.1 mmol/L (ref 3.5–5.1)
Sodium: 137 mmol/L (ref 135–145)
Total Bilirubin: 0.6 mg/dL (ref 0.3–1.2)
Total Protein: 6.9 g/dL (ref 6.5–8.1)

## 2020-12-18 MED ORDER — DIPHENHYDRAMINE HCL 50 MG/ML IJ SOLN
25.0000 mg | Freq: Once | INTRAMUSCULAR | Status: AC
  Administered 2020-12-18: 25 mg via INTRAVENOUS

## 2020-12-18 MED ORDER — SODIUM CHLORIDE 0.9 % IV SOLN
65.0000 mg/m2 | Freq: Once | INTRAVENOUS | Status: AC
  Administered 2020-12-18: 144 mg via INTRAVENOUS
  Filled 2020-12-18: qty 24

## 2020-12-18 MED ORDER — ONDANSETRON HCL 4 MG/2ML IJ SOLN
8.0000 mg | Freq: Once | INTRAMUSCULAR | Status: AC
  Administered 2020-12-18: 8 mg via INTRAVENOUS

## 2020-12-18 MED ORDER — FAMOTIDINE IN NACL 20-0.9 MG/50ML-% IV SOLN
20.0000 mg | Freq: Once | INTRAVENOUS | Status: AC
  Administered 2020-12-18: 20 mg via INTRAVENOUS

## 2020-12-18 MED ORDER — HEPARIN SOD (PORK) LOCK FLUSH 100 UNIT/ML IV SOLN
500.0000 [IU] | Freq: Once | INTRAVENOUS | Status: AC | PRN
  Administered 2020-12-18: 500 [IU]
  Filled 2020-12-18: qty 5

## 2020-12-18 MED ORDER — SODIUM CHLORIDE 0.9 % IV SOLN
10.0000 mg | Freq: Once | INTRAVENOUS | Status: AC
  Administered 2020-12-18: 10 mg via INTRAVENOUS
  Filled 2020-12-18: qty 10

## 2020-12-18 MED ORDER — DIPHENHYDRAMINE HCL 50 MG/ML IJ SOLN
INTRAMUSCULAR | Status: AC
  Filled 2020-12-18: qty 1

## 2020-12-18 MED ORDER — ONDANSETRON HCL 4 MG/2ML IJ SOLN
INTRAMUSCULAR | Status: AC
  Filled 2020-12-18: qty 4

## 2020-12-18 MED ORDER — SODIUM CHLORIDE 0.9 % IV SOLN
Freq: Once | INTRAVENOUS | Status: AC
  Filled 2020-12-18: qty 250

## 2020-12-18 MED ORDER — FAMOTIDINE IN NACL 20-0.9 MG/50ML-% IV SOLN
INTRAVENOUS | Status: AC
  Filled 2020-12-18: qty 50

## 2020-12-18 MED ORDER — SODIUM CHLORIDE 0.9% FLUSH
10.0000 mL | INTRAVENOUS | Status: DC | PRN
  Administered 2020-12-18: 10 mL
  Filled 2020-12-18: qty 10

## 2020-12-18 NOTE — Patient Instructions (Signed)
[facility] Discharge Instructions for Patients Receiving Chemotherapy  Today you received the following chemotherapy agents:  Taxol.  To help prevent nausea and vomiting after your treatment, we encourage you to take your nausea medication as directed.   If you develop nausea and vomiting that is not controlled by your nausea medication, call the clinic.   BELOW ARE SYMPTOMS THAT SHOULD BE REPORTED IMMEDIATELY:  *FEVER GREATER THAN 100.5 F  *CHILLS WITH OR WITHOUT FEVER  NAUSEA AND VOMITING THAT IS NOT CONTROLLED WITH YOUR NAUSEA MEDICATION  *UNUSUAL SHORTNESS OF BREATH  *UNUSUAL BRUISING OR BLEEDING  TENDERNESS IN MOUTH AND THROAT WITH OR WITHOUT PRESENCE OF ULCERS  *URINARY PROBLEMS  *BOWEL PROBLEMS  UNUSUAL RASH Items with * indicate a potential emergency and should be followed up as soon as possible.  Feel free to call the clinic should you have any questions or concerns. The clinic phone number is [phone].  Please show the CHEMO ALERT CARD at check-in to the Emergency Department and triage nurse.   

## 2020-12-19 ENCOUNTER — Telehealth: Payer: Self-pay

## 2020-12-19 NOTE — Telephone Encounter (Signed)
Pt called to report nose bleed that happened today.   Pt is s/p D1C7 of Taxol.    Pt with nose bleed today, that was controlled with pressure.  Pt reports she has had a significant dry nose over the past few months.  Pt denies any abnormal bruising, denies bleeding around gums, and denies any blood in stool.    Platelet counts reviewed from 1/24, 409. RN educated patient to utilize humidifier and normal saline nose spray.  RN educated on signs of thrombocytopenia.  MD in agreement.  Pt will call if recurrent nose bleeds happen, or abnormal bruising or bleeding develop.

## 2020-12-20 ENCOUNTER — Telehealth: Payer: Self-pay

## 2020-12-20 NOTE — Telephone Encounter (Signed)
RN placed call to patient to f/u with previous encounter regarding nose bleeds.   Pt reports significant response to utilizing humidifier and normal saline nose spray.  RN encouraged continuation of use.    Pt was very thankful for follow up.  No further needs at this time.

## 2020-12-22 ENCOUNTER — Ambulatory Visit: Payer: 59

## 2020-12-24 NOTE — Progress Notes (Signed)
Patient Care Team: [name], [name] as PCP - General (Internal Medicine) [name], RN as Oncology Nurse Navigator [name], RN as Oncology Nurse Navigator [name], MD as Consulting Physician (General Surgery) [name], MD as Consulting Physician (Hematology and Oncology) Kyung Rudd, MD as Consulting Physician (Radiation Oncology)  DIAGNOSIS:    ICD-10-CM   1. Malignant neoplasm of lower-outer quadrant of left breast of female, estrogen receptor positive ([place])  C50.512    Z17.0     SUMMARY OF ONCOLOGIC HISTORY: Oncology History  Malignant neoplasm of lower-outer quadrant of left breast of female, estrogen receptor positive ([place])  07/20/2020 Initial Diagnosis   Screening mammogram detected left breast calcifications, enlarged left axillary lymph nodes, and an asymmetry in the right breast. Diagnostic mammogram showed in the left breast, a 2.4cm mass with calcifications, 3:30 position, a left axillary lymph node with cortical thickness, and in the right breast, no suspicious masses or calcifications. Left breast biopsy showed invasive and in situ mammary carcinoma in the breast and axilla, grade 2, HER-2 equivocal by IHC (2+), negative by FISH, ER+ 95%, PR+40%, Ki67 15%.    07/26/2020 Cancer Staging   Staging form: Breast, AJCC 8th Edition - Clinical stage from 07/26/2020: Stage IIA (cT2, cN1(f), cM0, G2, ER+, PR+, HER2-) - Signed by [name], MD on 07/26/2020   08/01/2020 Genetic Testing   No pathogenic variants detected in [facility] Multi-Cancer Panel.  The Multi-Cancer Panel offered by [facility] includes sequencing and/or deletion duplication testing of the following 85 genes: AIP, ALK, APC, ATM, AXIN2,BAP1,  BARD1, BLM, BMPR1A, BRCA1, BRCA2, BRIP1, CASR, CDC73, CDH1, CDK4, CDKN1B, CDKN1C, CDKN2A (p14ARF), CDKN2A (p16INK4a), CEBPA, CHEK2, CTNNA1, DICER1, DIS3L2, EGFR (c.2369C>T, p.Thr790Met variant only), EPCAM (Deletion/duplication testing only), FH, FLCN,  GATA2, GPC3, GREM1 (Promoter region deletion/duplication testing only), HOXB13 (c.251G>A, p.Gly84Glu), HRAS, KIT, MAX, MEN1, MET, MITF (c.952G>A, p.Glu318Lys variant only), MLH1, MSH2, MSH3, MSH6, MUTYH, NBN, NF1, NF2, NTHL1, PALB2, PDGFRA, PHOX2B, PMS2, POLD1, POLE, POT1, PRKAR1A, PTCH1, PTEN, RAD50, RAD51C, RAD51D, RB1, RECQL4, RET, RNF43, RUNX1, SDHAF2, SDHA (sequence changes only), SDHB, SDHC, SDHD, SMAD4, SMARCA4, SMARCB1, SMARCE1, STK11, SUFU, TERC, TERT, TMEM127, TP53, TSC1, TSC2, VHL, WRN and WT1.  The report date is August 01, 2020.    08/16/2020 Surgery   Left lumpectomy [name]): IDC, grade 2, 2.3cm, with intermediate grade DCIS, clear margins, 1/2 left axillary lymph nodes positive for carcinoma.HER-2 equivocal by IHC (2+), negative by FISH, ER+ 95%, PR+40%, Ki67 15%.    08/24/2020 Cancer Staging   Staging form: Breast, AJCC 8th Edition - Pathologic stage from 08/24/2020: Stage IB (pT2, pN1a, cM0, G2, ER+, PR+, HER2-) - Signed by [name], MD on 08/24/2020   08/30/2020 Miscellaneous   MammaPrint high risk   09/13/2020 -  Chemotherapy    Patient is on Treatment Plan: BREAST ADJUVANT DOSE DENSE AC Q14D / PACLITAXEL Q7D        CHIEF COMPLIANT: Cycle 8 Taxol  INTERVAL HISTORY: Lindsay Hampton is a 55 y.o. with above-mentioned history of left breast cancer treated with lumpectomy and who is currently on adjuvant chemotherapy with weekly Taxol after completing 4 cycles of Adriamycin and Cytoxan. She presents to the clinic today for treatment.     ALLERGIES:  has No Known Allergies.  MEDICATIONS:  Current Outpatient Medications  Medication Sig Dispense Refill   ALPRAZolam (XANAX) 0.25 MG tablet Take 1 tablet (0.25 mg total) by mouth 2 (two) times daily as needed for anxiety. 60 tablet 3   dexamethasone (  DECADRON) 4 MG tablet Take 1 tablet (4 mg total) by mouth as needed (daily as needed for nausea). 30 tablet 0   lidocaine-prilocaine (EMLA) cream Apply to affected area once  (Patient taking differently: Apply 1 application topically as needed. Apply to affected area once) 30 g 3   metroNIDAZOLE (METROCREAM) 0.75 % cream Apply 1 application topically once a week. Apply to face daily     ondansetron (ZOFRAN ODT) 8 MG disintegrating tablet Take 1 tablet (8 mg total) by mouth every 8 (eight) hours as needed for nausea or vomiting. 20 tablet 1   prochlorperazine (COMPAZINE) 10 MG tablet Take 1 tablet (10 mg total) by mouth every 6 (six) hours as needed (Nausea or vomiting). 30 tablet 1   No current facility-administered medications for this visit.    PHYSICAL EXAMINATION: ECOG PERFORMANCE STATUS: 1 - Symptomatic but completely ambulatory  Vitals:   12/25/20 1322  BP: 124/78  Pulse: 93  Resp: 16  Temp: 97.6 F (36.4 C)  SpO2: 100%   Filed Weights   12/25/20 1322  Weight: 225 lb (102.1 kg)     LABORATORY DATA:  I have reviewed the data as listed CMP Latest Ref Rng & Units 12/18/2020 12/11/2020 12/05/2020  Glucose 70 - 99 mg/dL 142(H) 119(H) 141(H)  BUN 6 - 20 mg/dL _0 Creatinine 0.44 - 1.00 mg/dL 0.81 0.64 0.70  Sodium 135 - 145 mmol/L 137 137 132(L)  Potassium 3.5 - 5.1 mmol/L 4.1 4.0 3.5  Chloride 98 - 111 mmol/L 106 106 101  CO2 22 - 32 mmol/L _1 Calcium 8.9 - 10.3 mg/dL 9.0 9.4 9.1  Total Protein 6.5 - 8.1 g/dL 6.9 6.9 7.0  Total Bilirubin 0.3 - 1.2 mg/dL 0.6 0.7 2.0(H)  Alkaline Phos 38 - 126 U/L 91 70 77  AST 15 - 41 U/L 32 27 48(H)  ALT 0 - 44 U/L 65(H) 43 126(H)    Lab Results  Component Value Date   WBC 5.5 12/25/2020   HGB 9.9 (L) 12/25/2020   HCT 29.1 (L) 12/25/2020   MCV 98.3 12/25/2020   PLT 324 12/25/2020   NEUTROABS 3.9 12/25/2020    ASSESSMENT & PLAN:  Malignant neoplasm of lower-outer quadrant of left breast of female, estrogen receptor positive ([place]) 08/16/2020:Left lumpectomy [name]): IDC, grade 2, 2.3cm, with intermediate grade DCIS, clear margins, 1/2 left axillary lymph nodes positive for  carcinoma.HER-2 equivocal by IHC (2+), negative by FISH, ER+ 95%, PR+40%, Ki67 15%. T2 N1 M0 stage Ib MammaPrint: High risk (average 10-year risk untreated: 29%, predicted benefit of chemo with hormone therapy at 5 years: 94.6%  Treatment plan: 1.Adjuvant chemotherapy withdose dense Adriamycin and Cytoxan x4 followed by Taxol x12 2.Followed by adjuvant radiation 3.Followed byadjuvant antiestrogen therapy ------------------------------------------------------------------------------------------------------------------------------------ Current treatment:Completed 4 cycles ofdose Adriamycin and Cytoxan, today cycle8Taxol Echocardiogram 09/08/2020: EF 60 to 65%  Chemo toxicities: 1.Severe nausea and vomiting: Resolved 2.abdominal distention and bloating: Resolved 3. elevated LFTs: Normalized  4. chemo induced anemia: Monitoring today's hemoglobin is 9.9  Return to clinicweekly for Taxol every other week for follow-up with [place]    No orders of the defined types were placed in this encounter.  The patient has a good understanding of the overall plan. she agrees with it. she will call with any problems that may develop before the next visit here.  Total time spent: 30 mins including face to face time and time spent for planning, charting and coordination of care  I, Molly Dorshimer, am  acting as scribe for Dr. [name].  I have reviewed the above documentation for accuracy and completeness, and I agree with the above.

## 2020-12-25 ENCOUNTER — Inpatient Hospital Stay: Payer: 59

## 2020-12-25 ENCOUNTER — Other Ambulatory Visit: Payer: Self-pay

## 2020-12-25 ENCOUNTER — Encounter: Payer: Self-pay | Admitting: *Deleted

## 2020-12-25 ENCOUNTER — Ambulatory Visit: Payer: 59

## 2020-12-25 ENCOUNTER — Inpatient Hospital Stay (HOSPITAL_BASED_OUTPATIENT_CLINIC_OR_DEPARTMENT_OTHER): Payer: 59 | Admitting: Hematology and Oncology

## 2020-12-25 DIAGNOSIS — Z170 Estrogen receptor positive status [ER+]: Secondary | ICD-10-CM | POA: Diagnosis not present

## 2020-12-25 DIAGNOSIS — C50512 Malignant neoplasm of lower-outer quadrant of left female breast: Secondary | ICD-10-CM

## 2020-12-25 DIAGNOSIS — Z95828 Presence of other vascular implants and grafts: Secondary | ICD-10-CM

## 2020-12-25 LAB — CMP (CANCER CENTER ONLY)
ALT: 53 U/L — ABNORMAL HIGH (ref 0–44)
AST: 27 U/L (ref 15–41)
Albumin: 3.4 g/dL — ABNORMAL LOW (ref 3.5–5.0)
Alkaline Phosphatase: 60 U/L (ref 38–126)
Anion gap: 5 (ref 5–15)
BUN: 13 mg/dL (ref 6–20)
CO2: 24 mmol/L (ref 22–32)
Calcium: 9.2 mg/dL (ref 8.9–10.3)
Chloride: 109 mmol/L (ref 98–111)
Creatinine: 0.64 mg/dL (ref 0.44–1.00)
GFR, Estimated: >60 mL/min (ref >60)
Glucose, Bld: 105 mg/dL — ABNORMAL HIGH (ref 70–99)
Potassium: 3.8 mmol/L (ref 3.5–5.1)
Sodium: 138 mmol/L (ref 135–145)
Total Bilirubin: 0.6 mg/dL (ref 0.3–1.2)
Total Protein: 6.4 g/dL — ABNORMAL LOW (ref 6.5–8.1)

## 2020-12-25 LAB — CBC WITH DIFFERENTIAL (CANCER CENTER ONLY)
Abs Immature Granulocytes: 0.06 10*3/uL (ref 0.00–0.07)
Basophils Absolute: 0.1 10*3/uL (ref 0.0–0.1)
Basophils Relative: 1 %
Eosinophils Absolute: 0.1 10*3/uL (ref 0.0–0.5)
Eosinophils Relative: 2 %
HCT: 29.1 % — ABNORMAL LOW (ref 36.0–46.0)
Hemoglobin: 9.9 g/dL — ABNORMAL LOW (ref 12.0–15.0)
Immature Granulocytes: 1 %
Lymphocytes Relative: 16 %
Lymphs Abs: 0.9 10*3/uL (ref 0.7–4.0)
MCH: 33.4 pg (ref 26.0–34.0)
MCHC: 34 g/dL (ref 30.0–36.0)
MCV: 98.3 fL (ref 80.0–100.0)
Monocytes Absolute: 0.5 10*3/uL (ref 0.1–1.0)
Monocytes Relative: 9 %
Neutro Abs: 3.9 10*3/uL (ref 1.7–7.7)
Neutrophils Relative %: 71 %
Platelet Count: 324 10*3/uL (ref 150–400)
RBC: 2.96 MIL/uL — ABNORMAL LOW (ref 3.87–5.11)
RDW: 15.5 % (ref 11.5–15.5)
WBC Count: 5.5 10*3/uL (ref 4.0–10.5)
nRBC: 0.4 % — ABNORMAL HIGH (ref 0.0–0.2)

## 2020-12-25 MED ORDER — DIPHENHYDRAMINE HCL 50 MG/ML IJ SOLN
INTRAMUSCULAR | Status: AC
  Filled 2020-12-25: qty 1

## 2020-12-25 MED ORDER — ONDANSETRON HCL 4 MG/2ML IJ SOLN
8.0000 mg | Freq: Once | INTRAMUSCULAR | Status: AC
  Administered 2020-12-25: 8 mg via INTRAVENOUS

## 2020-12-25 MED ORDER — SODIUM CHLORIDE 0.9% FLUSH
10.0000 mL | INTRAVENOUS | Status: DC | PRN
  Administered 2020-12-25: 10 mL
  Filled 2020-12-25: qty 10

## 2020-12-25 MED ORDER — HEPARIN SOD (PORK) LOCK FLUSH 100 UNIT/ML IV SOLN
500.0000 [IU] | Freq: Once | INTRAVENOUS | Status: AC | PRN
  Administered 2020-12-25: 500 [IU]
  Filled 2020-12-25: qty 5

## 2020-12-25 MED ORDER — DIPHENHYDRAMINE HCL 50 MG/ML IJ SOLN
25.0000 mg | Freq: Once | INTRAMUSCULAR | Status: AC
  Administered 2020-12-25: 25 mg via INTRAVENOUS

## 2020-12-25 MED ORDER — SODIUM CHLORIDE 0.9 % IV SOLN
Freq: Once | INTRAVENOUS | Status: AC
  Filled 2020-12-25: qty 250

## 2020-12-25 MED ORDER — FAMOTIDINE IN NACL 20-0.9 MG/50ML-% IV SOLN
20.0000 mg | Freq: Once | INTRAVENOUS | Status: AC
  Administered 2020-12-25: 20 mg via INTRAVENOUS

## 2020-12-25 MED ORDER — SODIUM CHLORIDE 0.9% FLUSH
10.0000 mL | Freq: Once | INTRAVENOUS | Status: AC
  Administered 2020-12-25: 10 mL
  Filled 2020-12-25: qty 10

## 2020-12-25 MED ORDER — FAMOTIDINE IN NACL 20-0.9 MG/50ML-% IV SOLN
INTRAVENOUS | Status: AC
  Filled 2020-12-25: qty 50

## 2020-12-25 MED ORDER — ONDANSETRON HCL 4 MG/2ML IJ SOLN
INTRAMUSCULAR | Status: AC
  Filled 2020-12-25: qty 4

## 2020-12-25 MED ORDER — SODIUM CHLORIDE 0.9 % IV SOLN
10.0000 mg | Freq: Once | INTRAVENOUS | Status: AC
  Administered 2020-12-25: 10 mg via INTRAVENOUS
  Filled 2020-12-25: qty 10

## 2020-12-25 MED ORDER — SODIUM CHLORIDE 0.9 % IV SOLN
65.0000 mg/m2 | Freq: Once | INTRAVENOUS | Status: AC
  Administered 2020-12-25: 144 mg via INTRAVENOUS
  Filled 2020-12-25: qty 24

## 2020-12-25 NOTE — Patient Instructions (Signed)

## 2020-12-25 NOTE — Assessment & Plan Note (Signed)
08/16/2020:Left lumpectomy Lindsay Hampton): IDC, grade 2, 2.3cm, with intermediate grade DCIS, clear margins, 1/2 left axillary lymph nodes positive for carcinoma.HER-2 equivocal by IHC (2+), negative by FISH, ER+ 95%, PR+40%, Ki67 15%. T2 N1 M0 stage Ib MammaPrint: High risk (average 10-year risk untreated: 29%, predicted benefit of chemo with hormone therapy at 5 years: 94.6%  Treatment plan: 1.Adjuvant chemotherapy withdose dense Adriamycin and Cytoxan x4 followed by Taxol x12 2.Followed by adjuvant radiation 3.Followed byadjuvant antiestrogen therapy ------------------------------------------------------------------------------------------------------------------------------------ Current treatment:Completed 4 cycles ofdose Adriamycin and Cytoxan, today cycle8Taxol Echocardiogram 09/08/2020: EF 60 to 65%  Chemo toxicities: 1.Severe nausea and vomiting: Resolved 2.abdominal distention and bloating: Resolved 3. elevated LFTs: Normalized  4. chemo induced anemia: Monitoring today's hemoglobin is 9.9  Return to clinicweekly for Taxol every other week for follow-up with [place]

## 2020-12-25 NOTE — Patient Instructions (Signed)
[facility] Discharge Instructions for Patients Receiving Chemotherapy  Today you received the following chemotherapy agents: paclitaxel.  To help prevent nausea and vomiting after your treatment, we encourage you to take your nausea medication as directed.   If you develop nausea and vomiting that is not controlled by your nausea medication, call the clinic.   BELOW ARE SYMPTOMS THAT SHOULD BE REPORTED IMMEDIATELY:  *FEVER GREATER THAN 100.5 F  *CHILLS WITH OR WITHOUT FEVER  NAUSEA AND VOMITING THAT IS NOT CONTROLLED WITH YOUR NAUSEA MEDICATION  *UNUSUAL SHORTNESS OF BREATH  *UNUSUAL BRUISING OR BLEEDING  TENDERNESS IN MOUTH AND THROAT WITH OR WITHOUT PRESENCE OF ULCERS  *URINARY PROBLEMS  *BOWEL PROBLEMS  UNUSUAL RASH Items with * indicate a potential emergency and should be followed up as soon as possible.  Feel free to call the clinic should you have any questions or concerns. The clinic phone number is [phone].  Please show the CHEMO ALERT CARD at check-in to the Emergency Department and triage nurse.   

## 2020-12-27 ENCOUNTER — Telehealth: Payer: Self-pay | Admitting: Hematology and Oncology

## 2020-12-27 ENCOUNTER — Encounter: Payer: Self-pay | Admitting: *Deleted

## 2020-12-27 NOTE — Telephone Encounter (Signed)
Informed patient of her upcoming appointment. Patient is aware. °

## 2020-12-29 ENCOUNTER — Ambulatory Visit: Payer: 59

## 2021-01-01 ENCOUNTER — Inpatient Hospital Stay: Payer: 59

## 2021-01-01 ENCOUNTER — Other Ambulatory Visit: Payer: Self-pay

## 2021-01-01 ENCOUNTER — Inpatient Hospital Stay: Payer: 59 | Attending: Hematology and Oncology

## 2021-01-01 VITALS — BP 120/75 | HR 94 | Temp 98.2°F | Resp 18 | Wt 222.5 lb

## 2021-01-01 DIAGNOSIS — Z5111 Encounter for antineoplastic chemotherapy: Secondary | ICD-10-CM | POA: Insufficient documentation

## 2021-01-01 DIAGNOSIS — Z170 Estrogen receptor positive status [ER+]: Secondary | ICD-10-CM

## 2021-01-01 DIAGNOSIS — C50512 Malignant neoplasm of lower-outer quadrant of left female breast: Secondary | ICD-10-CM

## 2021-01-01 DIAGNOSIS — Z95828 Presence of other vascular implants and grafts: Secondary | ICD-10-CM

## 2021-01-01 LAB — CBC WITH DIFFERENTIAL (CANCER CENTER ONLY)
Abs Immature Granulocytes: 0.05 10*3/uL (ref 0.00–0.07)
Basophils Absolute: 0.1 10*3/uL (ref 0.0–0.1)
Basophils Relative: 1 %
Eosinophils Absolute: 0.1 10*3/uL (ref 0.0–0.5)
Eosinophils Relative: 2 %
HCT: 32.2 % — ABNORMAL LOW (ref 36.0–46.0)
Hemoglobin: 10.7 g/dL — ABNORMAL LOW (ref 12.0–15.0)
Immature Granulocytes: 1 %
Lymphocytes Relative: 15 %
Lymphs Abs: 0.9 10*3/uL (ref 0.7–4.0)
MCH: 33 pg (ref 26.0–34.0)
MCHC: 33.2 g/dL (ref 30.0–36.0)
MCV: 99.4 fL (ref 80.0–100.0)
Monocytes Absolute: 0.5 10*3/uL (ref 0.1–1.0)
Monocytes Relative: 8 %
Neutro Abs: 4.2 10*3/uL (ref 1.7–7.7)
Neutrophils Relative %: 73 %
Platelet Count: 278 10*3/uL (ref 150–400)
RBC: 3.24 MIL/uL — ABNORMAL LOW (ref 3.87–5.11)
RDW: 15 % (ref 11.5–15.5)
WBC Count: 5.7 10*3/uL (ref 4.0–10.5)
nRBC: 0 % (ref 0.0–0.2)

## 2021-01-01 LAB — CMP (CANCER CENTER ONLY)
ALT: 47 U/L — ABNORMAL HIGH (ref 0–44)
AST: 29 U/L (ref 15–41)
Albumin: 3.6 g/dL (ref 3.5–5.0)
Alkaline Phosphatase: 60 U/L (ref 38–126)
Anion gap: 6 (ref 5–15)
BUN: 13 mg/dL (ref 6–20)
CO2: 23 mmol/L (ref 22–32)
Calcium: 9.3 mg/dL (ref 8.9–10.3)
Chloride: 108 mmol/L (ref 98–111)
Creatinine: 0.73 mg/dL (ref 0.44–1.00)
GFR, Estimated: >60 mL/min (ref >60)
Glucose, Bld: 122 mg/dL — ABNORMAL HIGH (ref 70–99)
Potassium: 4 mmol/L (ref 3.5–5.1)
Sodium: 137 mmol/L (ref 135–145)
Total Bilirubin: 0.6 mg/dL (ref 0.3–1.2)
Total Protein: 6.7 g/dL (ref 6.5–8.1)

## 2021-01-01 MED ORDER — SODIUM CHLORIDE 0.9% FLUSH
10.0000 mL | Freq: Once | INTRAVENOUS | Status: AC
  Administered 2021-01-01: 10 mL
  Filled 2021-01-01: qty 10

## 2021-01-01 MED ORDER — DIPHENHYDRAMINE HCL 50 MG/ML IJ SOLN
INTRAMUSCULAR | Status: AC
  Filled 2021-01-01: qty 1

## 2021-01-01 MED ORDER — SODIUM CHLORIDE 0.9 % IV SOLN
Freq: Once | INTRAVENOUS | Status: AC
  Filled 2021-01-01: qty 250

## 2021-01-01 MED ORDER — SODIUM CHLORIDE 0.9 % IV SOLN
65.0000 mg/m2 | Freq: Once | INTRAVENOUS | Status: AC
  Administered 2021-01-01: 144 mg via INTRAVENOUS
  Filled 2021-01-01: qty 24

## 2021-01-01 MED ORDER — FAMOTIDINE IN NACL 20-0.9 MG/50ML-% IV SOLN
INTRAVENOUS | Status: AC
  Filled 2021-01-01: qty 50

## 2021-01-01 MED ORDER — DIPHENHYDRAMINE HCL 50 MG/ML IJ SOLN
25.0000 mg | Freq: Once | INTRAMUSCULAR | Status: AC
Start: 2021-01-01 — End: 2021-01-01
  Administered 2021-01-01: 25 mg via INTRAVENOUS

## 2021-01-01 MED ORDER — SODIUM CHLORIDE 0.9% FLUSH
10.0000 mL | INTRAVENOUS | Status: DC | PRN
  Administered 2021-01-01: 10 mL
  Filled 2021-01-01: qty 10

## 2021-01-01 MED ORDER — HEPARIN SOD (PORK) LOCK FLUSH 100 UNIT/ML IV SOLN
500.0000 [IU] | Freq: Once | INTRAVENOUS | Status: AC | PRN
  Administered 2021-01-01: 500 [IU]
  Filled 2021-01-01: qty 5

## 2021-01-01 MED ORDER — ONDANSETRON HCL 4 MG/2ML IJ SOLN
INTRAMUSCULAR | Status: AC
  Filled 2021-01-01: qty 4

## 2021-01-01 MED ORDER — ONDANSETRON HCL 4 MG/2ML IJ SOLN
8.0000 mg | Freq: Once | INTRAMUSCULAR | Status: AC
  Administered 2021-01-01: 8 mg via INTRAVENOUS

## 2021-01-01 MED ORDER — SODIUM CHLORIDE 0.9 % IV SOLN
10.0000 mg | Freq: Once | INTRAVENOUS | Status: AC
  Administered 2021-01-01: 10 mg via INTRAVENOUS
  Filled 2021-01-01: qty 10

## 2021-01-01 MED ORDER — FAMOTIDINE IN NACL 20-0.9 MG/50ML-% IV SOLN
20.0000 mg | Freq: Once | INTRAVENOUS | Status: AC
  Administered 2021-01-01: 20 mg via INTRAVENOUS

## 2021-01-01 NOTE — Patient Instructions (Signed)
[facility] Discharge Instructions for Patients Receiving Chemotherapy  Today you received the following chemotherapy agents: paclitaxel.  To help prevent nausea and vomiting after your treatment, we encourage you to take your nausea medication as directed.   If you develop nausea and vomiting that is not controlled by your nausea medication, call the clinic.   BELOW ARE SYMPTOMS THAT SHOULD BE REPORTED IMMEDIATELY:  *FEVER GREATER THAN 100.5 F  *CHILLS WITH OR WITHOUT FEVER  NAUSEA AND VOMITING THAT IS NOT CONTROLLED WITH YOUR NAUSEA MEDICATION  *UNUSUAL SHORTNESS OF BREATH  *UNUSUAL BRUISING OR BLEEDING  TENDERNESS IN MOUTH AND THROAT WITH OR WITHOUT PRESENCE OF ULCERS  *URINARY PROBLEMS  *BOWEL PROBLEMS  UNUSUAL RASH Items with * indicate a potential emergency and should be followed up as soon as possible.  Feel free to call the clinic should you have any questions or concerns. The clinic phone number is [phone].  Please show the CHEMO ALERT CARD at check-in to the Emergency Department and triage nurse.   

## 2021-01-01 NOTE — Patient Instructions (Signed)

## 2021-01-02 ENCOUNTER — Other Ambulatory Visit: Payer: Self-pay | Admitting: Hematology and Oncology

## 2021-01-07 NOTE — Assessment & Plan Note (Deleted)
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 T2 N1 M0 stage Ib MammaPrint: High risk (average 10-year risk untreated: 29%, predicted benefit of chemo with hormone therapy at 5 years: 94.6%  Treatment plan: 1.Adjuvant chemotherapy withdose dense Adriamycin and Cytoxan x4 followed by Taxol x12 2.Followed by adjuvant radiation 3.Followed byadjuvant antiestrogen therapy ------------------------------------------------------------------------------------------------------------------------------------ Current treatment:Completed 4 cycles ofdose Adriamycin and Cytoxan, today cycle10Taxol Echocardiogram 09/08/2020: EF 60 to 65%  Chemo toxicities: 1.Severe nausea and vomiting:Resolved 2.abdominal distention and bloating:Resolved 3.elevated LFTs: Normalized 4.chemo induced anemia: Monitoring today's hemoglobin is 9.9  Return to clinicweekly for Taxol every other week for follow-up with [place]

## 2021-01-07 NOTE — Progress Notes (Incomplete)
Patient Care Team: [name], [place] as PCP - General (Internal Medicine) [name], RN as Oncology Nurse Navigator [name], RN as Oncology Nurse Navigator [name], MD as Consulting Physician (General Surgery) [name], MD as Consulting Physician (Hematology and Oncology) Kyung Rudd, MD as Consulting Physician (Radiation Oncology)  DIAGNOSIS: No diagnosis found.  SUMMARY OF ONCOLOGIC HISTORY: Oncology History  Malignant neoplasm of lower-outer quadrant of left breast of female, estrogen receptor positive ([place])  07/20/2020 Initial Diagnosis   Screening mammogram detected left breast calcifications, enlarged left axillary lymph nodes, and an asymmetry in the right breast. Diagnostic mammogram showed in the left breast, a 2.4cm mass with calcifications, 3:30 position, a left axillary lymph node with cortical thickness, and in the right breast, no suspicious masses or calcifications. Left breast biopsy showed invasive and in situ mammary carcinoma in the breast and axilla, grade 2, HER-2 equivocal by IHC (2+), negative by FISH, ER+ 95%, PR+40%, Ki67 15%.    07/26/2020 Cancer Staging   Staging form: Breast, AJCC 8th Edition - Clinical stage from 07/26/2020: Stage IIA (cT2, cN1(f), cM0, G2, ER+, PR+, HER2-) - Signed by [name], MD on 07/26/2020   08/01/2020 Genetic Testing   No pathogenic variants detected in [facility] Multi-Cancer Panel.  The Multi-Cancer Panel offered by [facility] includes sequencing and/or deletion duplication testing of the following 85 genes: AIP, ALK, APC, ATM, AXIN2,BAP1,  BARD1, BLM, BMPR1A, BRCA1, BRCA2, BRIP1, CASR, CDC73, CDH1, CDK4, CDKN1B, CDKN1C, CDKN2A (p14ARF), CDKN2A (p16INK4a), CEBPA, CHEK2, CTNNA1, DICER1, DIS3L2, EGFR (c.2369C>T, p.Thr790Met variant only), EPCAM (Deletion/duplication testing only), FH, FLCN, GATA2, GPC3, GREM1 (Promoter region deletion/duplication testing only), HOXB13 (c.251G>A, p.Gly84Glu), HRAS, KIT, MAX, MEN1,  MET, MITF (c.952G>A, p.Glu318Lys variant only), MLH1, MSH2, MSH3, MSH6, MUTYH, NBN, NF1, NF2, NTHL1, PALB2, PDGFRA, PHOX2B, PMS2, POLD1, POLE, POT1, PRKAR1A, PTCH1, PTEN, RAD50, RAD51C, RAD51D, RB1, RECQL4, RET, RNF43, RUNX1, SDHAF2, SDHA (sequence changes only), SDHB, SDHC, SDHD, SMAD4, SMARCA4, SMARCB1, SMARCE1, STK11, SUFU, TERC, TERT, TMEM127, TP53, TSC1, TSC2, VHL, WRN and WT1.  The report date is August 01, 2020.    08/16/2020 Surgery   Left lumpectomy [name]): IDC, grade 2, 2.3cm, with intermediate grade DCIS, clear margins, 1/2 left axillary lymph nodes positive for carcinoma.HER-2 equivocal by IHC (2+), negative by FISH, ER+ 95%, PR+40%, Ki67 15%.    08/24/2020 Cancer Staging   Staging form: Breast, AJCC 8th Edition - Pathologic stage from 08/24/2020: Stage IB (pT2, pN1a, cM0, G2, ER+, PR+, HER2-) - Signed by [name], MD on 08/24/2020   08/30/2020 Miscellaneous   MammaPrint high risk   09/13/2020 -  Chemotherapy    Patient is on Treatment Plan: BREAST ADJUVANT DOSE DENSE AC Q14D / PACLITAXEL Q7D        CHIEF COMPLIANT: Cycle 10 Taxol  INTERVAL HISTORY: MAKIYLA LINCH is a 55 y.o. with above-mentioned history of left breast cancer treated with lumpectomy andwho iscurrentlyon adjuvant chemotherapywith weekly Taxol after completing 4 cycles of Adriamycin and Cytoxan.She presents to the clinic todayfor treatment.   ALLERGIES:  has No Known Allergies.  MEDICATIONS:  Current Outpatient Medications  Medication Sig Dispense Refill  . ALPRAZolam (XANAX) 0.25 MG tablet Take 1 tablet (0.25 mg total) by mouth 2 (two) times daily as needed for anxiety. 60 tablet 3  . dexamethasone (DECADRON) 4 MG tablet Take 1 tablet (4 mg total) by mouth as needed (daily as needed for nausea). 30 tablet 0  . lidocaine-prilocaine (EMLA) cream Apply to affected area once (Patient taking differently: Apply  1 application topically as needed. Apply to affected area once) 30 g 3  . metroNIDAZOLE  (METROCREAM) 0.75 % cream Apply 1 application topically once a week. Apply to face daily    . ondansetron (ZOFRAN-ODT) 8 MG disintegrating tablet TAKE 1 TABLET BY MOUTH EVERY 8 HOURS AS NEEDED FOR NAUSEA OR VOMITING. 20 tablet 1  . prochlorperazine (COMPAZINE) 10 MG tablet Take 1 tablet (10 mg total) by mouth every 6 (six) hours as needed (Nausea or vomiting). 30 tablet 1   No current facility-administered medications for this visit.    PHYSICAL EXAMINATION: ECOG PERFORMANCE STATUS: {CHL ONC ECOG PS:[phone]}  There were no vitals filed for this visit. There were no vitals filed for this visit.   LABORATORY DATA:  I have reviewed the data as listed CMP Latest Ref Rng & Units 01/01/2021 12/25/2020 12/18/2020  Glucose 70 - 99 mg/dL 122(H) 105(H) 142(H)  BUN 6 - 20 mg/dL 13 13 14   Creatinine 0.44 - 1.00 mg/dL 0.73 0.64 0.81  Sodium 135 - 145 mmol/L 137 138 137  Potassium 3.5 - 5.1 mmol/L 4.0 3.8 4.1  Chloride 98 - 111 mmol/L 108 109 106  CO2 22 - 32 mmol/L 23 24 23   Calcium 8.9 - 10.3 mg/dL 9.3 9.2 9.0  Total Protein 6.5 - 8.1 g/dL 6.7 6.4(L) 6.9  Total Bilirubin 0.3 - 1.2 mg/dL 0.6 0.6 0.6  Alkaline Phos 38 - 126 U/L 60 60 91  AST 15 - 41 U/L 29 27 32  ALT 0 - 44 U/L 47(H) 53(H) 65(H)    Lab Results  Component Value Date   WBC 5.7 01/01/2021   HGB 10.7 (L) 01/01/2021   HCT 32.2 (L) 01/01/2021   MCV 99.4 01/01/2021   PLT 278 01/01/2021   NEUTROABS 4.2 01/01/2021    ASSESSMENT & PLAN:  No problem-specific Assessment & Plan notes found for this encounter.    No orders of the defined types were placed in this encounter.  The patient has a good understanding of the overall plan. she agrees with it. she will call with any problems that may develop before the next visit here.  Total time spent: *** mins including face to face time and time spent for planning, charting and coordination of care  Rulon Eisenmenger, MD, MPH 01/07/2021  I, Molly Dorshimer, am acting as scribe for  Dr. [name].  {insert scribe attestation}

## 2021-01-08 ENCOUNTER — Ambulatory Visit: Payer: 59 | Admitting: Hematology and Oncology

## 2021-01-08 ENCOUNTER — Observation Stay (HOSPITAL_COMMUNITY): Payer: 59

## 2021-01-08 ENCOUNTER — Encounter (HOSPITAL_COMMUNITY): Payer: Self-pay

## 2021-01-08 ENCOUNTER — Other Ambulatory Visit: Payer: Self-pay

## 2021-01-08 ENCOUNTER — Emergency Department (HOSPITAL_COMMUNITY): Payer: 59

## 2021-01-08 ENCOUNTER — Other Ambulatory Visit: Payer: 59

## 2021-01-08 ENCOUNTER — Ambulatory Visit: Payer: 59

## 2021-01-08 ENCOUNTER — Inpatient Hospital Stay (HOSPITAL_COMMUNITY)
Admission: EM | Admit: 2021-01-08 | Discharge: 2021-01-11 | DRG: 417 | Disposition: A | Discharge status: Home / Self Care | Payer: 59 | Attending: Internal Medicine | Admitting: Internal Medicine

## 2021-01-08 DIAGNOSIS — R7989 Other specified abnormal findings of blood chemistry: Secondary | ICD-10-CM | POA: Diagnosis present

## 2021-01-08 DIAGNOSIS — I160 Hypertensive urgency: Secondary | ICD-10-CM

## 2021-01-08 DIAGNOSIS — R778 Other specified abnormalities of plasma proteins: Secondary | ICD-10-CM | POA: Diagnosis not present

## 2021-01-08 DIAGNOSIS — R17 Unspecified jaundice: Secondary | ICD-10-CM

## 2021-01-08 DIAGNOSIS — Z170 Estrogen receptor positive status [ER+]: Secondary | ICD-10-CM

## 2021-01-08 DIAGNOSIS — I248 Other forms of acute ischemic heart disease: Secondary | ICD-10-CM | POA: Diagnosis present

## 2021-01-08 DIAGNOSIS — K8067 Calculus of gallbladder and bile duct with acute and chronic cholecystitis with obstruction: Principal | ICD-10-CM | POA: Diagnosis present

## 2021-01-08 DIAGNOSIS — D649 Anemia, unspecified: Secondary | ICD-10-CM | POA: Diagnosis present

## 2021-01-08 DIAGNOSIS — K8050 Calculus of bile duct without cholangitis or cholecystitis without obstruction: Secondary | ICD-10-CM | POA: Diagnosis present

## 2021-01-08 DIAGNOSIS — R1011 Right upper quadrant pain: Secondary | ICD-10-CM

## 2021-01-08 DIAGNOSIS — Z419 Encounter for procedure for purposes other than remedying health state, unspecified: Secondary | ICD-10-CM

## 2021-01-08 DIAGNOSIS — C50512 Malignant neoplasm of lower-outer quadrant of left female breast: Secondary | ICD-10-CM

## 2021-01-08 DIAGNOSIS — Z20822 Contact with and (suspected) exposure to covid-19: Secondary | ICD-10-CM | POA: Diagnosis present

## 2021-01-08 DIAGNOSIS — E876 Hypokalemia: Secondary | ICD-10-CM

## 2021-01-08 DIAGNOSIS — F419 Anxiety disorder, unspecified: Secondary | ICD-10-CM | POA: Diagnosis present

## 2021-01-08 DIAGNOSIS — Z6835 Body mass index (BMI) 35.0-35.9, adult: Secondary | ICD-10-CM

## 2021-01-08 DIAGNOSIS — R932 Abnormal findings on diagnostic imaging of liver and biliary tract: Secondary | ICD-10-CM

## 2021-01-08 DIAGNOSIS — Z9221 Personal history of antineoplastic chemotherapy: Secondary | ICD-10-CM

## 2021-01-08 DIAGNOSIS — K660 Peritoneal adhesions (postprocedural) (postinfection): Secondary | ICD-10-CM | POA: Diagnosis present

## 2021-01-08 DIAGNOSIS — R000 Tachycardia, unspecified: Secondary | ICD-10-CM | POA: Diagnosis present

## 2021-01-08 DIAGNOSIS — E669 Obesity, unspecified: Secondary | ICD-10-CM | POA: Diagnosis present

## 2021-01-08 DIAGNOSIS — I10 Essential (primary) hypertension: Secondary | ICD-10-CM | POA: Diagnosis present

## 2021-01-08 DIAGNOSIS — K851 Biliary acute pancreatitis without necrosis or infection: Secondary | ICD-10-CM | POA: Diagnosis not present

## 2021-01-08 DIAGNOSIS — Z803 Family history of malignant neoplasm of breast: Secondary | ICD-10-CM

## 2021-01-08 DIAGNOSIS — Z86010 Personal history of colonic polyps: Secondary | ICD-10-CM

## 2021-01-08 DIAGNOSIS — K810 Acute cholecystitis: Secondary | ICD-10-CM | POA: Diagnosis present

## 2021-01-08 DIAGNOSIS — K219 Gastro-esophageal reflux disease without esophagitis: Secondary | ICD-10-CM | POA: Diagnosis present

## 2021-01-08 DIAGNOSIS — Z79899 Other long term (current) drug therapy: Secondary | ICD-10-CM

## 2021-01-08 HISTORY — DX: Hypertensive urgency: I16.0

## 2021-01-08 LAB — CBC WITH DIFFERENTIAL/PLATELET
Abs Immature Granulocytes: 0.06 10*3/uL (ref 0.00–0.07)
Basophils Absolute: 0.1 10*3/uL (ref 0.0–0.1)
Basophils Relative: 1 %
Eosinophils Absolute: 0 10*3/uL (ref 0.0–0.5)
Eosinophils Relative: 0 %
HCT: 33.4 % — ABNORMAL LOW (ref 36.0–46.0)
Hemoglobin: 10.9 g/dL — ABNORMAL LOW (ref 12.0–15.0)
Immature Granulocytes: 1 %
Lymphocytes Relative: 12 %
Lymphs Abs: 0.7 10*3/uL (ref 0.7–4.0)
MCH: 32.8 pg (ref 26.0–34.0)
MCHC: 32.6 g/dL (ref 30.0–36.0)
MCV: 100.6 fL — ABNORMAL HIGH (ref 80.0–100.0)
Monocytes Absolute: 0.3 10*3/uL (ref 0.1–1.0)
Monocytes Relative: 5 %
Neutro Abs: 4.7 10*3/uL (ref 1.7–7.7)
Neutrophils Relative %: 81 %
Platelets: 330 10*3/uL (ref 150–400)
RBC: 3.32 MIL/uL — ABNORMAL LOW (ref 3.87–5.11)
RDW: 14.6 % (ref 11.5–15.5)
WBC: 5.8 10*3/uL (ref 4.0–10.5)
nRBC: 0.3 % — ABNORMAL HIGH (ref 0.0–0.2)

## 2021-01-08 LAB — HIV ANTIBODY (ROUTINE TESTING W REFLEX): HIV Screen 4th Generation wRfx: NONREACTIVE

## 2021-01-08 LAB — COMPREHENSIVE METABOLIC PANEL
ALT: 94 U/L — ABNORMAL HIGH (ref 0–44)
AST: 104 U/L — ABNORMAL HIGH (ref 15–41)
Albumin: 4.1 g/dL (ref 3.5–5.0)
Alkaline Phosphatase: 74 U/L (ref 38–126)
Anion gap: 12 (ref 5–15)
BUN: 15 mg/dL (ref 6–20)
CO2: 22 mmol/L (ref 22–32)
Calcium: 9.7 mg/dL (ref 8.9–10.3)
Chloride: 102 mmol/L (ref 98–111)
Creatinine, Ser: 0.62 mg/dL (ref 0.44–1.00)
GFR, Estimated: >60 mL/min (ref >60)
Glucose, Bld: 191 mg/dL — ABNORMAL HIGH (ref 70–99)
Potassium: 3.8 mmol/L (ref 3.5–5.1)
Sodium: 136 mmol/L (ref 135–145)
Total Bilirubin: 2 mg/dL — ABNORMAL HIGH (ref 0.3–1.2)
Total Protein: 7.2 g/dL (ref 6.5–8.1)

## 2021-01-08 LAB — ECHOCARDIOGRAM COMPLETE
Area-P 1/2: 3.37 cm2
Height: 66 in
S' Lateral: 3.5 cm
Weight: 3520 oz

## 2021-01-08 LAB — TROPONIN I (HIGH SENSITIVITY)
Troponin I (High Sensitivity): 19 ng/L — ABNORMAL HIGH (ref <18)
Troponin I (High Sensitivity): 28 ng/L — ABNORMAL HIGH (ref <18)
Troponin I (High Sensitivity): 24 ng/L — ABNORMAL HIGH (ref <18)
Troponin I (High Sensitivity): 33 ng/L — ABNORMAL HIGH (ref <18)

## 2021-01-08 LAB — RESP PANEL BY RT-PCR (FLU A&B, COVID) ARPGX2
Influenza A by PCR: NEGATIVE
Influenza B by PCR: NEGATIVE
SARS Coronavirus 2 by RT PCR: NEGATIVE

## 2021-01-08 LAB — LIPASE, BLOOD: Lipase: 25 U/L (ref 11–51)

## 2021-01-08 IMAGING — MR MR ABDOMEN WO/W CM MRCP
17 of 20 series · 40 of 48 positions shown · IV contrast (gadavist)
Comparison: 9 cc Gadavist abdominal ultrasound of earlier today.
CTA of the chest, abdomen, and pelvis of earlier today.

CLINICAL DATA: Right upper quadrant pain. Jaundice. Breast cancer.
Evaluate possible pancreatitis.

EXAM:
MRI ABDOMEN WITHOUT AND WITH CONTRAST (INCLUDING MRCP)
TECHNIQUE: Multiplanar multisequence MR imaging of the abdomen was performed
both before and after the administration of intravenous contrast.
Heavily T2-weighted images of the biliary and pancreatic ducts were
obtained, and three-dimensional MRCP images were rendered by post
processing.
CONTRAST:  10mL GADAVIST GADOBUTROL 1 MMOL/ML IV SOLN

[Series 4: T2 fat-sat · axial · 6.0mm · 1.25mm/px · z∈[-124,+143]mm · 2 of 38 slices shown]
[im 1/38]
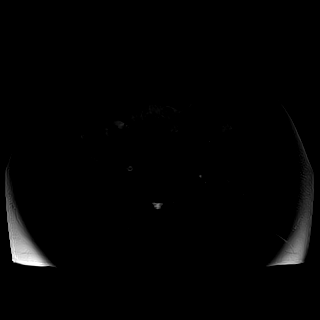
[im 38/38]
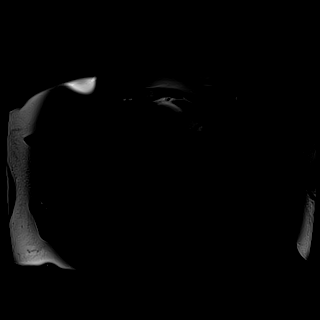

[Series 6: cor_3d_spc_trig · coronal · 1.0mm · 0.49mm/px · 3 of 88 slices shown]
[im 1/88]
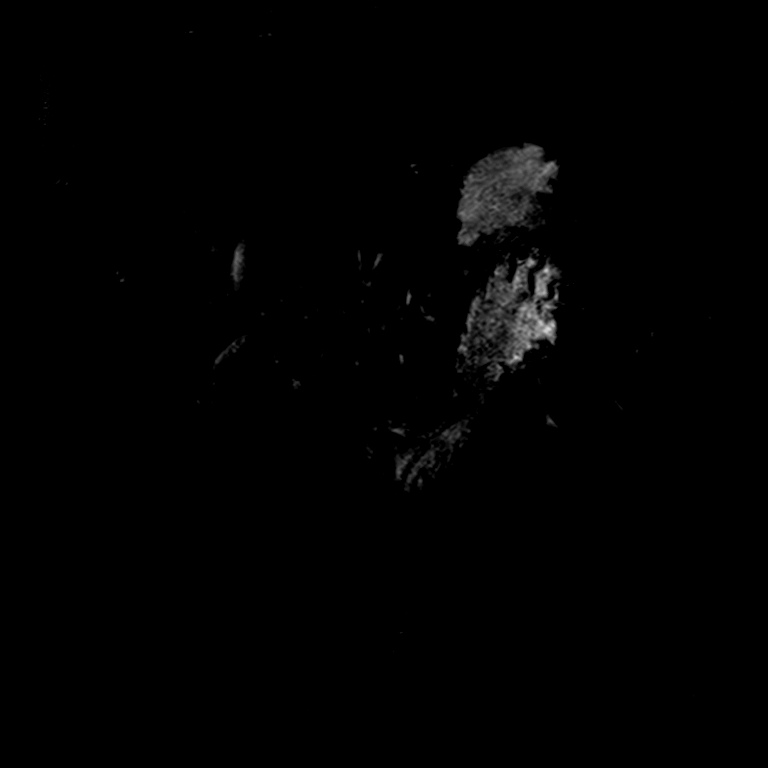
[im 44/88]
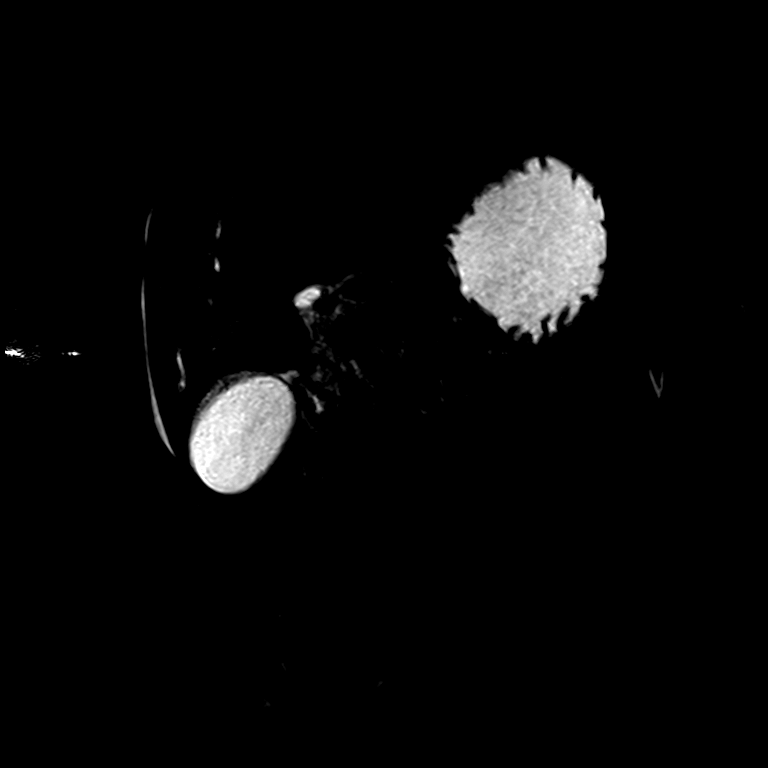
[im 88/88]
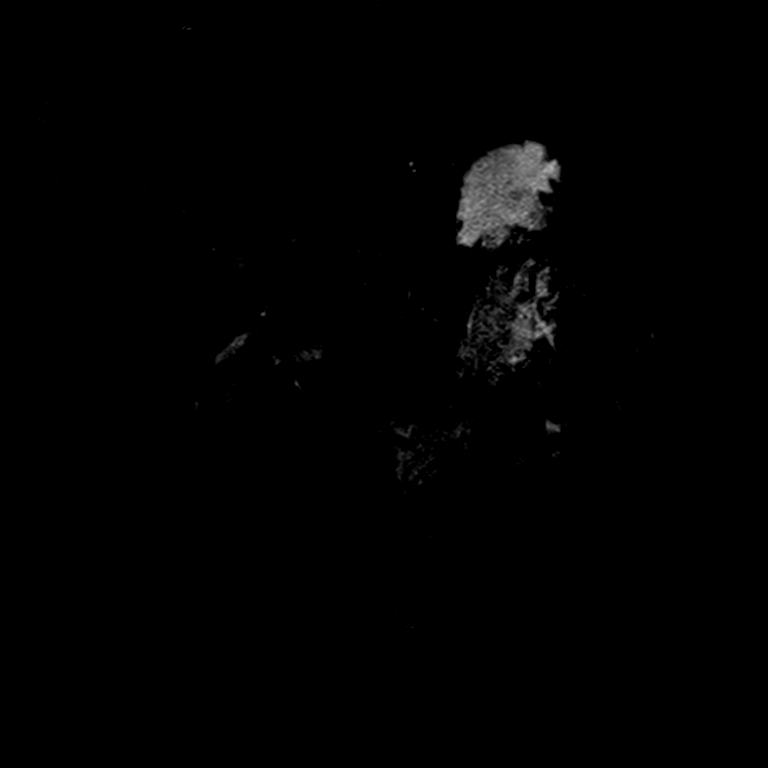

[Series 9: DWI · axial · 6.0mm · 1.49mm/px · z∈[-129,+152]mm · 3 of 80 slices shown (1 of 2)]
[im 1/80]
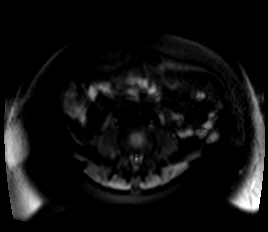
[im 40/80]
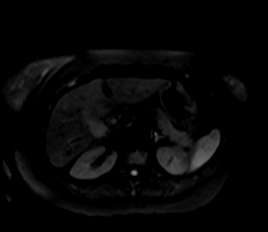
[im 80/80]
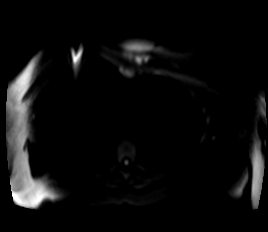

[Series 10: DWI · axial · 6.0mm · 1.49mm/px · 1 of 40 slices shown (2 of 2)]
[im 1/40]
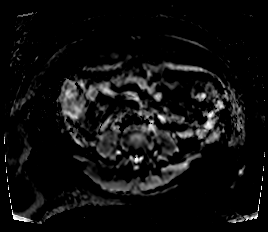

[Series 12: T2 · coronal · 6.0mm · 1.48mm/px · 1 of 30 slices shown (1 of 2)]
[im 1/30]
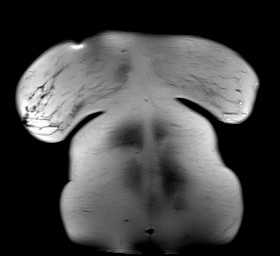

[Series 13: T1 · axial · 3.0mm · 1.25mm/px · z∈[-143,+118]mm · 3 of 88 slices shown (1 of 2)]
[im 1/88]
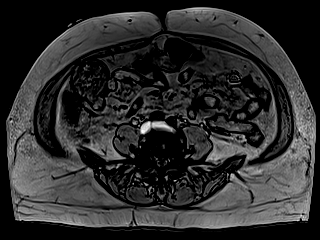
[im 44/88]
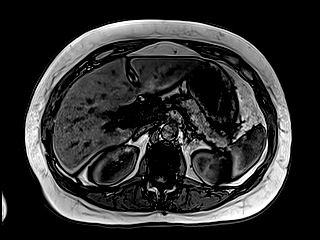
[im 88/88]
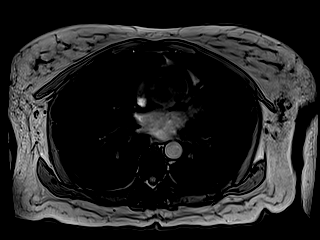

[Series 14: T1 · axial · 3.0mm · 1.25mm/px · z∈[-143,+118]mm · 3 of 88 slices shown (2 of 2)]
[im 1/88]
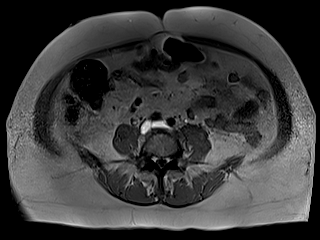
[im 44/88]
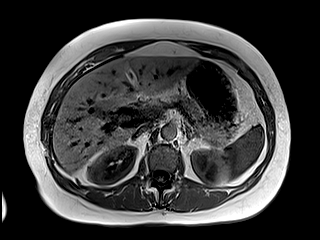
[im 88/88]
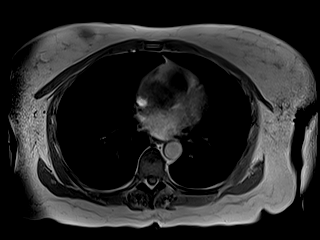

[Series 15: cor obl thk · sagittal · 50.0mm · 0.78mm/px · 1 of 9 slices shown]
[im 1/9]
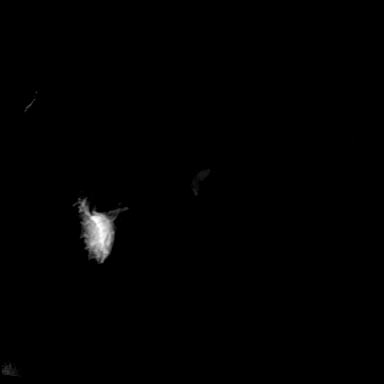

[Series 16: T2 · axial · 6.0mm · 1.56mm/px · 1 of 39 slices shown (2 of 2)]
[im 1/39]
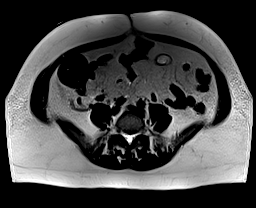

[Series 18: T1 dynamic · axial · 3.0mm · 1.25mm/px · z∈[-147,+114]mm · 3 of 88 slices shown (1 of 6)]
[im 1/88]
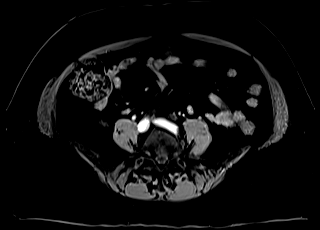
[im 44/88]
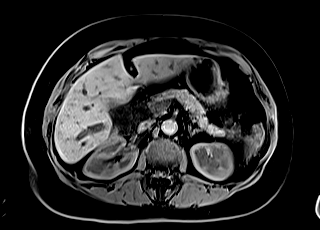
[im 88/88]
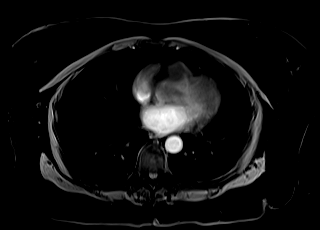

[Series 21: T1 dynamic · axial · 3.0mm · 1.25mm/px · z∈[-147,+114]mm · 3 of 88 slices shown (2 of 6)]
[im 1/88]
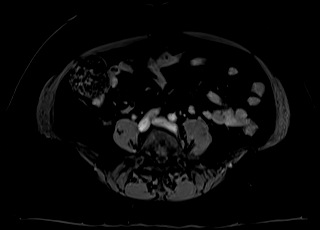
[im 44/88]
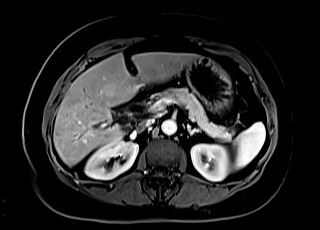
[im 88/88]
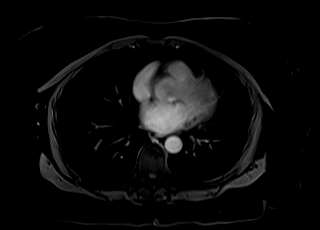

[Series 23: T1 dynamic · axial · 3.0mm · 1.25mm/px · z∈[-147,+114]mm · 3 of 88 slices shown (3 of 6)]
[im 1/88]
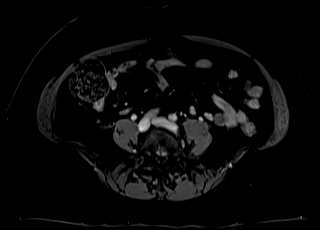
[im 44/88]
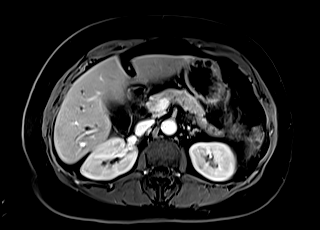
[im 88/88]
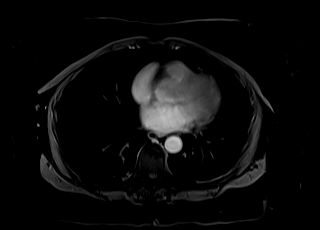

[Series 25: T1 dynamic · axial · 3.0mm · 1.25mm/px · z∈[-147,+114]mm · 3 of 88 slices shown (4 of 6)]
[im 1/88]
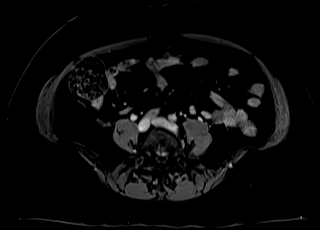
[im 44/88]
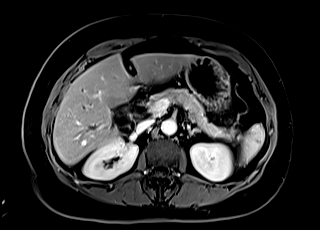
[im 88/88]
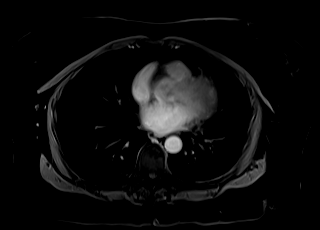

[Series 27: T1 dynamic · coronal · 4.5mm · 1.41mm/px · 2 of 52 slices shown (5 of 6)]
[im 1/52]
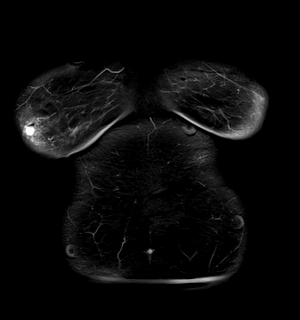
[im 52/52]
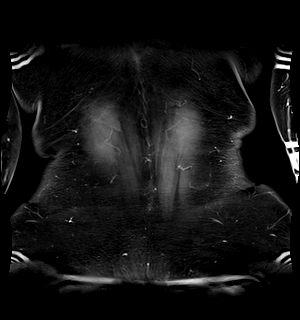

[Series 29: T1 dynamic · axial · 3.0mm · 1.25mm/px · z∈[-147,+114]mm · 3 of 88 slices shown (6 of 6)]
[im 1/88]
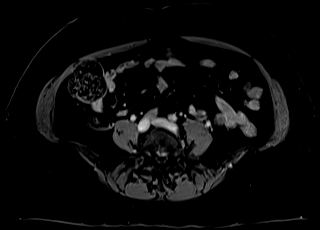
[im 44/88]
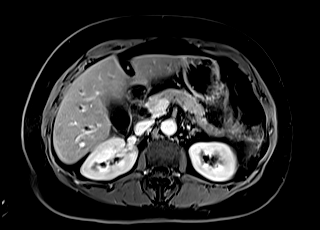
[im 88/88]
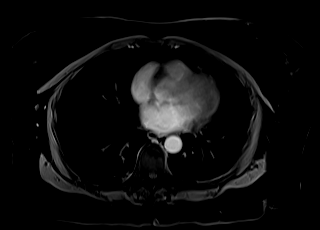

[Series 101: sub (id) · axial · 3.0mm · 1.25mm/px · z∈[-147,+114]mm · 3 of 88 slices shown]
[im 1/88]
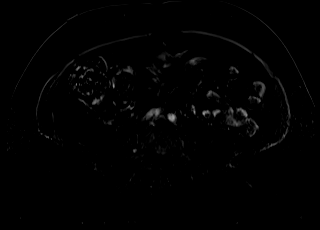
[im 44/88]
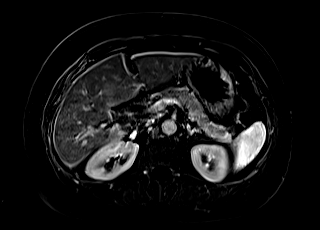
[im 88/88]
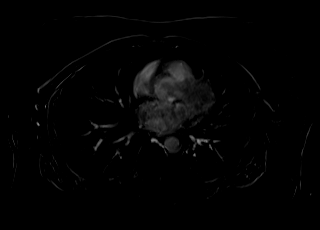

[Series 102: sub 45 sec · axial · 3.0mm · 1.25mm/px · z∈[-147,-18]mm · 2 of 88 slices shown]
[im 1/88]
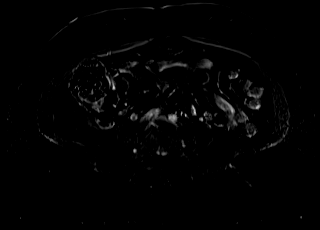
[im 44/88]
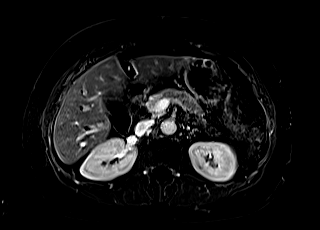

[40 of 48 positions shown; findings below may reference images not displayed]

FINDINGS: Lower chest: Normal heart size without pericardial or pleural
effusion.

Hepatobiliary: No focal liver lesion. Moderate intrahepatic biliary
duct dilatation.

Multiple tiny gallstones without specific evidence of acute
cholecystitis.

Moderate common duct dilatation, including at 1.0 cm on [DATE]. The
common duct tapers gradually, without evidence of obstructive stone
or mass.

Pancreas: Mild peripancreatic edema, including on [DATE]. No evidence
of pancreatic necrosis, duct dilatation, or well-circumscribed
peripancreatic fluid collection.

Spleen:  Normal in size, without focal abnormality.

Adrenals/Urinary Tract: Normal adrenal glands. Normal kidneys,
without hydronephrosis.

Stomach/Bowel: Tiny hiatal hernia.  Normal abdominal bowel loops.

Vascular/Lymphatic: Aortic atherosclerosis. Retroaortic left renal
vein. No retroperitoneal or retrocrural adenopathy.

Other:  Trace perihepatic ascites.

Musculoskeletal: No acute osseous abnormality. Minimal convex left
lumbar spine curvature.
IMPRESSION: 1. Cholelithiasis. Moderate intra and extrahepatic biliary duct
dilatation, without obstructive stone or mass. Favor recent stone
passage. Consider surveillance of bilirubin. If persistently
elevated, ERCP should be considered to exclude less likely occult
ampullary stenosis.
2. Non complicated mild pancreatitis.
3.  Aortic Atherosclerosis ([2Q]-[2Q]).
4.  Tiny hiatal hernia.
5. Trace perihepatic ascites.

## 2021-01-08 IMAGING — CT CT ANGIO CHEST-ABD-PELV FOR DISSECTION W/ AND WO/W CM
3 of 8 series · 15 of 46 positions shown, 17 images · IV contrast (OMNIPAQUE 350)
Comparison: None.

CLINICAL DATA: Left-sided breast cancer undergoing chemotherapy.
Vomiting and upper back pain

EXAM:
CT ANGIOGRAPHY CHEST, ABDOMEN AND PELVIS
TECHNIQUE: Non-contrast CT of the chest was initially obtained.

[Series 6: axial arterial · axial · arterial · 0.72mm/px · z∈[-715,-184]mm · 10 of 217 slices shown, 12 images]
[im 20/217  soft-tissue]
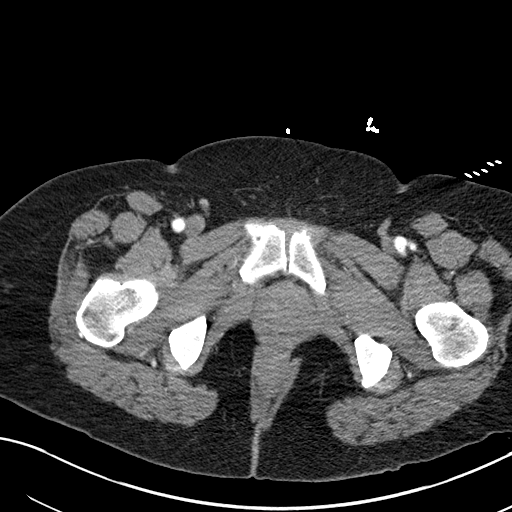
[im 20/217  bone]
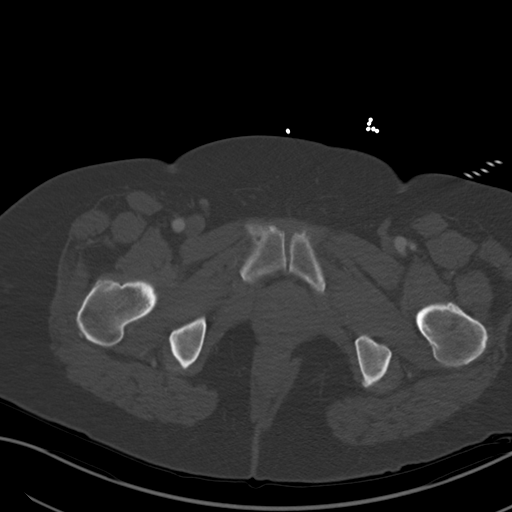
[im 40/217  soft-tissue]
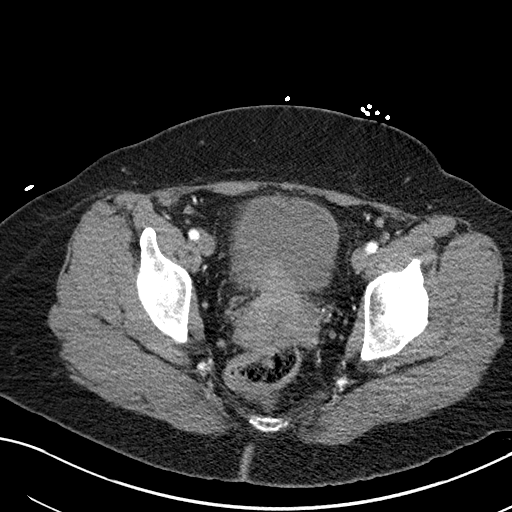
[im 59/217  soft-tissue]
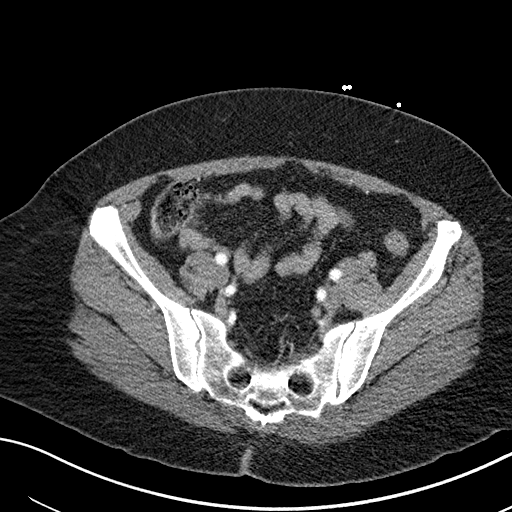
[im 79/217  soft-tissue]
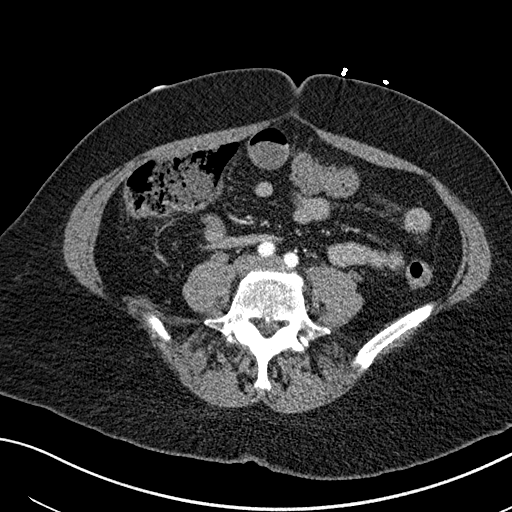
[im 99/217  soft-tissue]
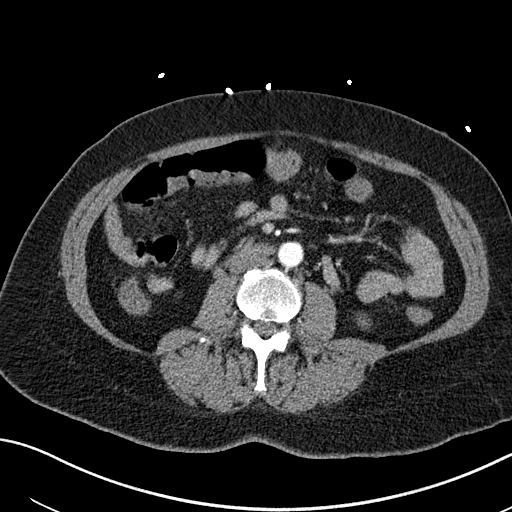
[im 118/217  soft-tissue]
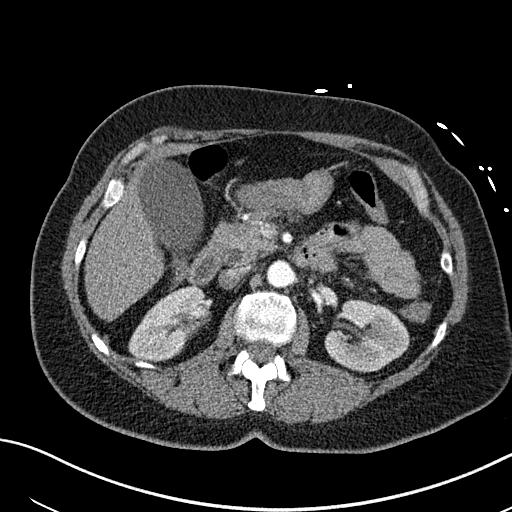
[im 138/217  soft-tissue]
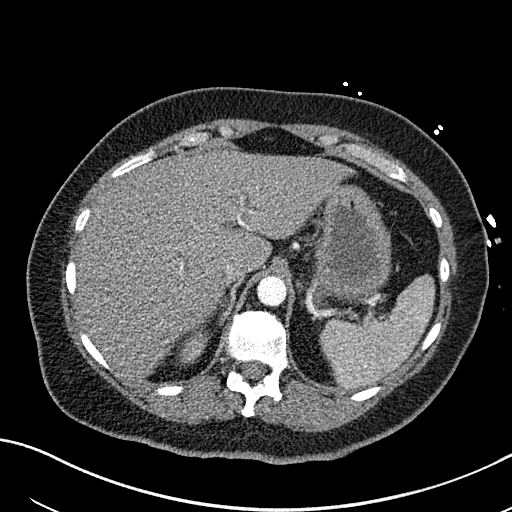
[im 158/217  soft-tissue]
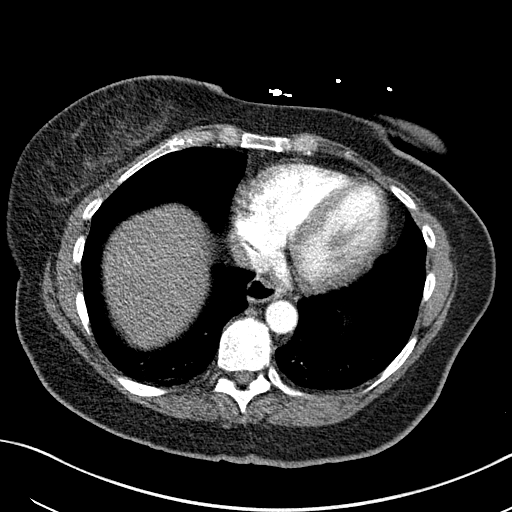
[im 177/217  soft-tissue]
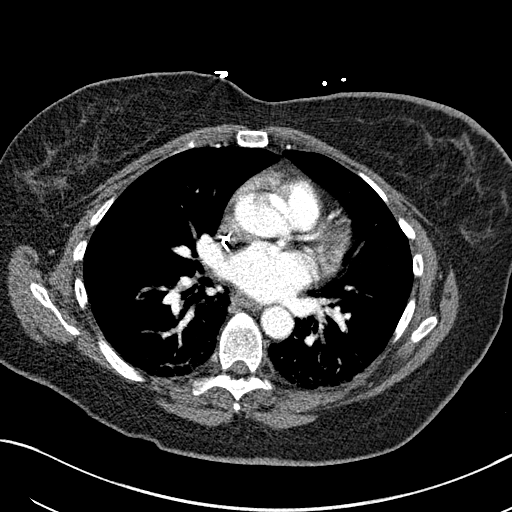
[im 177/217  bone]
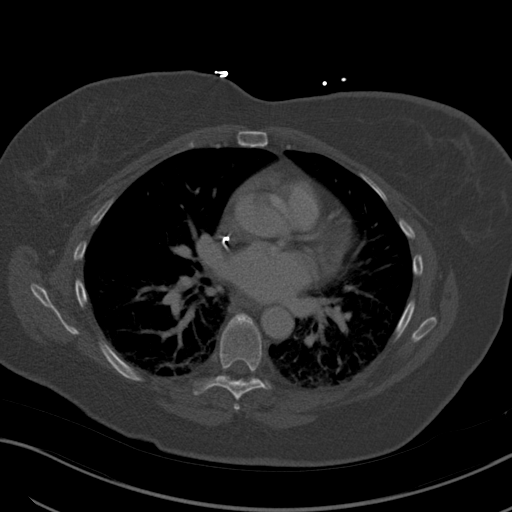
[im 197/217  soft-tissue]
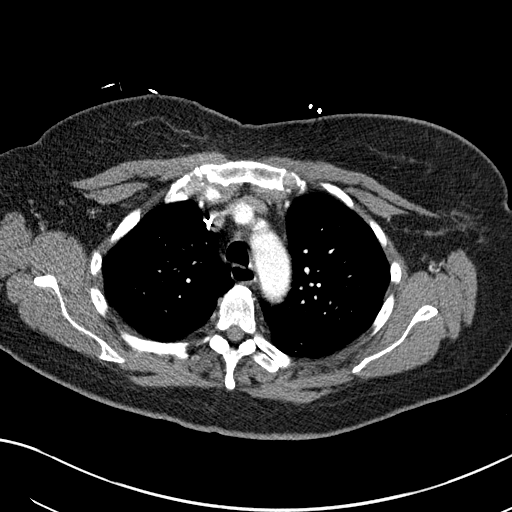

[Series 8: lung · axial · 0.72mm/px · z∈[-390,-352]mm · 2 of 153 slices shown]
[im 20/153  bone]
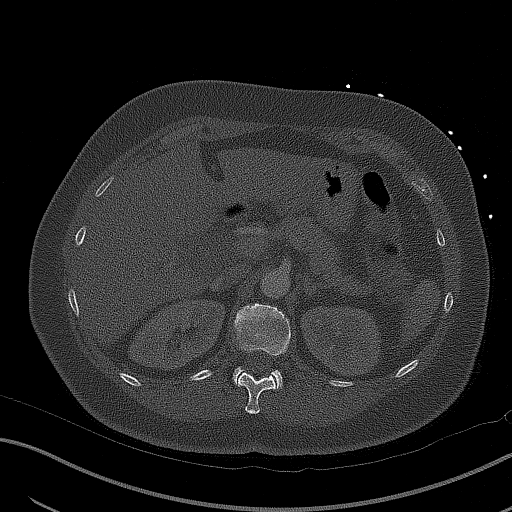
[im 39/153  bone]
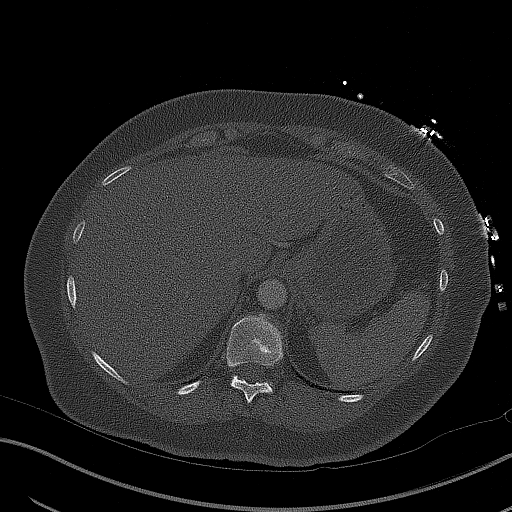

[Series 9: coronals · coronal · 0.91mm/px · 3 of 152 slices shown]
[im 38/152  soft-tissue]
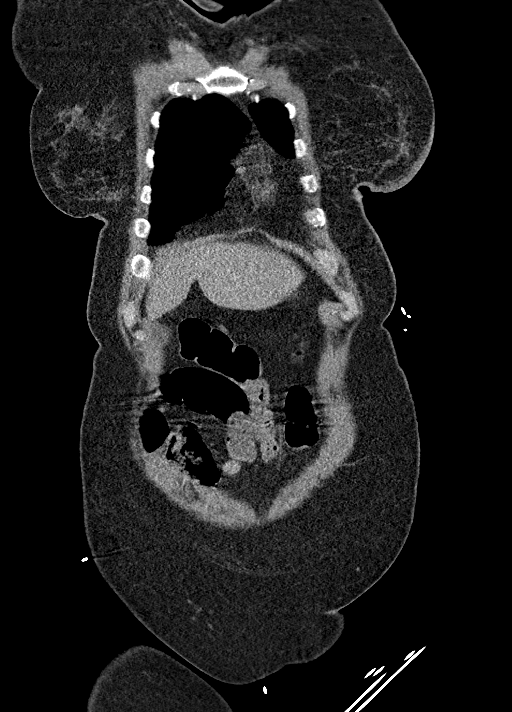
[im 76/152  soft-tissue]
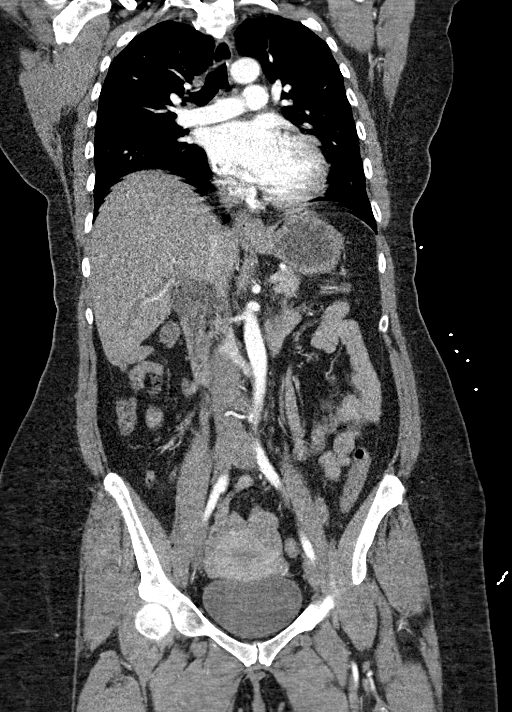
[im 114/152  soft-tissue]
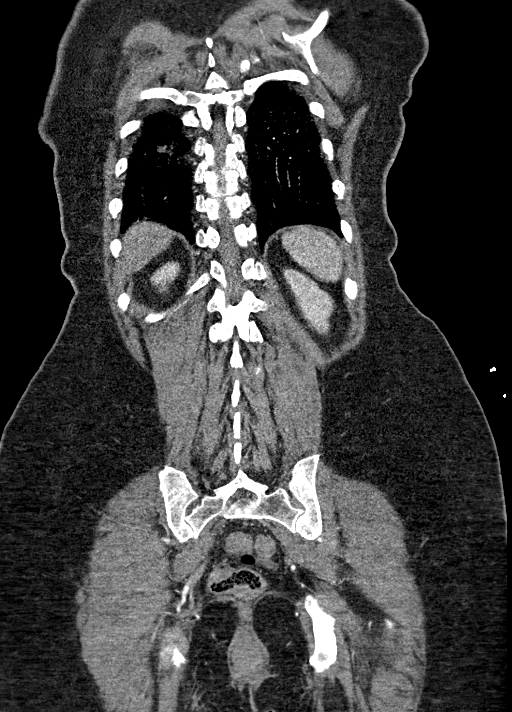

[15 of 46 positions shown; findings below may reference images not displayed]

Multidetector CT imaging through the chest, abdomen and pelvis was
performed using the standard protocol during bolus administration of
intravenous contrast. Multiplanar reconstructed images and MIPs were
obtained and reviewed to evaluate the vascular anatomy.

CONTRAST:  100mL OMNIPAQUE IOHEXOL 350 MG/ML SOLN
FINDINGS: CTA CHEST FINDINGS

Cardiovascular: Normal heart size. No pericardial effusion. No
aortic dissection, aneurysm, or intramural hematoma. Porta catheter
on the right with tip in good position at the upper cavoatrial
junction.

Mediastinum/Nodes: Negative for adenopathy or mass. Postoperative
left breast.

Lungs/Pleura: The central airways are clear. There is no edema,
consolidation, effusion, or pneumothorax.

Musculoskeletal: Patchy areas of thoracic spine sclerosis which are
adjacent endplates and appear reactive. No acute finding.

Review of the MIP images confirms the above findings.

CTA ABDOMEN AND PELVIS FINDINGS

VASCULAR

Aorta: Normal caliber aorta without aneurysm, dissection, vasculitis
or significant stenosis.

Celiac: Patent without evidence of aneurysm, dissection, vasculitis
or significant stenosis.

SMA: Patent without evidence of aneurysm, dissection, vasculitis or
significant stenosis.

Renals: Unremarkable

IMA: Patent

Inflow: Unremarkable

Veins: Unremarkable in the venous phase

Review of the MIP images confirms the above findings.

NON-VASCULAR

Hepatobiliary: No focal liver abnormality.Distended gallbladder and
bile ducts. The common bile duct measures up to 1 cm in diameter. No
visible choledocholithiasis. There is a calcified gallstone.

Pancreas: Mild stranding around the pancreas, especially at the body
and tail. No fluid collection or detectable necrosis.

Spleen: Unremarkable.

Adrenals/Urinary Tract: Negative adrenals. No hydronephrosis or
stone. Unremarkable bladder.

Stomach/Bowel:  No obstruction. No appendicitis.

Lymphatic: No mass or adenopathy.

Reproductive:2.4 cm subserosal fibroid.

Other: No ascites or pneumoperitoneum.

Musculoskeletal: No acute abnormalities.

Review of the MIP images confirms the above findings.
IMPRESSION: Suspect mild acute edematous pancreatitis. There is cholelithiasis
with biliary and gallbladder distension but no detected
choledocholithiasis, consider MRCP.

Normal CTA of the aorta.

## 2021-01-08 IMAGING — DX DG CHEST 1V PORT
1 series · 1 of 1 positions shown · non-contrast
Comparison: Chest x-ray [DATE].

CLINICAL DATA: Chest pain, vomiting, upper back

EXAM:
PORTABLE CHEST 1 VIEW

[chest ap]
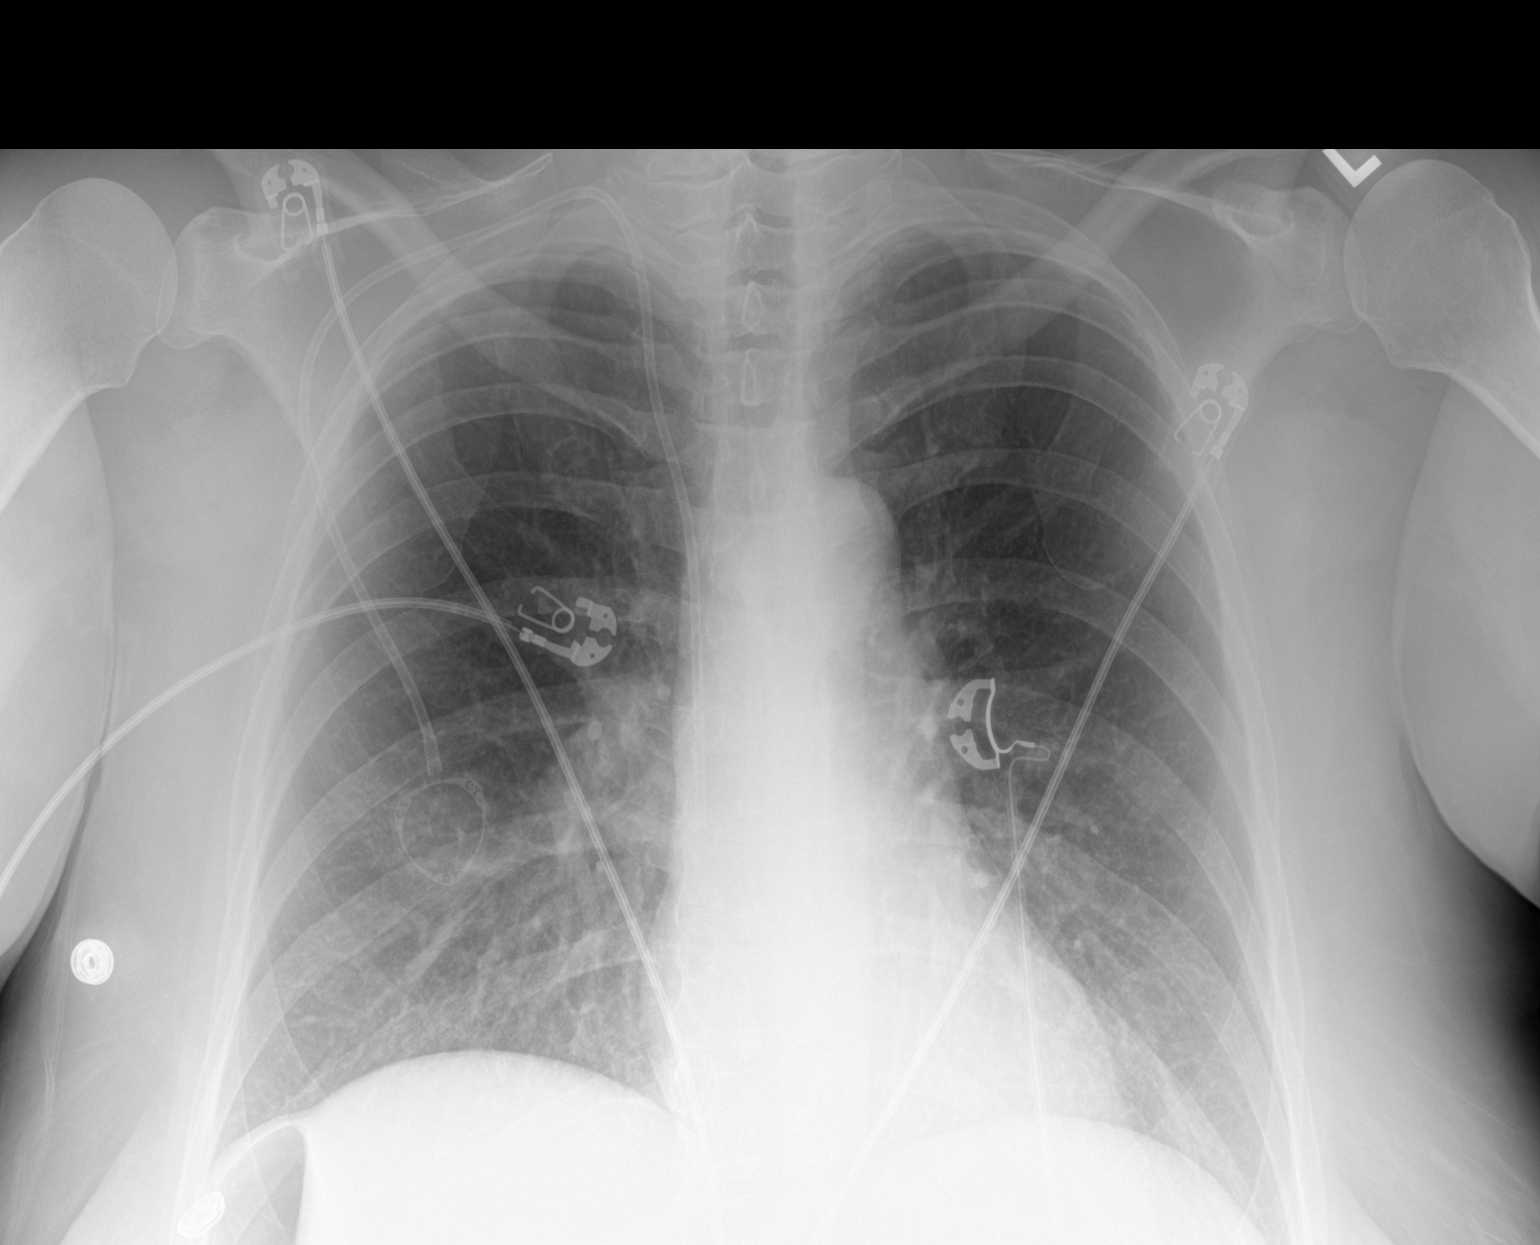

[1 of 1 positions shown; findings below may reference images not displayed]

FINDINGS: Right chest wall Port-A-Cath with tip overlying the expected region
of the superior cavoatrial junction.

The heart size and mediastinal contours are within normal limits.

No focal consolidation. No pulmonary edema. No pleural effusion. No
pneumothorax.

No acute osseous abnormality.

Partially visualized foci of gas within the right upper quadrant of
unclear etiology.
IMPRESSION: 1. No active disease.
2. Partially visualized foci of gas within the right upper quadrant
of unclear etiology. Finding likely represents gas within the large
bowel. Recommend x-ray abdomen for further evaluation.

## 2021-01-08 MED ORDER — IOHEXOL 350 MG/ML SOLN
100.0000 mL | Freq: Once | INTRAVENOUS | Status: AC | PRN
  Administered 2021-01-08: 100 mL via INTRAVENOUS

## 2021-01-08 MED ORDER — MORPHINE SULFATE (PF) 2 MG/ML IV SOLN
2.0000 mg | INTRAVENOUS | Status: DC | PRN
  Administered 2021-01-08 (×4): 2 mg via INTRAVENOUS
  Filled 2021-01-08 (×4): qty 1

## 2021-01-08 MED ORDER — SODIUM CHLORIDE 0.9 % IV BOLUS
1000.0000 mL | Freq: Once | INTRAVENOUS | Status: AC
  Administered 2021-01-08: 1000 mL via INTRAVENOUS

## 2021-01-08 MED ORDER — PANTOPRAZOLE SODIUM 40 MG PO TBEC
40.0000 mg | DELAYED_RELEASE_TABLET | Freq: Every day | ORAL | Status: DC
  Administered 2021-01-08 – 2021-01-11 (×4): 40 mg via ORAL
  Filled 2021-01-08 (×4): qty 1

## 2021-01-08 MED ORDER — LACTATED RINGERS IV SOLN: INTRAVENOUS | Status: AC

## 2021-01-08 MED ORDER — PIPERACILLIN-TAZOBACTAM 3.375 G IVPB
3.3750 g | Freq: Three times a day (TID) | INTRAVENOUS | Status: DC
  Administered 2021-01-08 – 2021-01-11 (×8): 3.375 g via INTRAVENOUS
  Filled 2021-01-08 (×8): qty 50

## 2021-01-08 MED ORDER — ONDANSETRON HCL 4 MG/2ML IJ SOLN
4.0000 mg | Freq: Four times a day (QID) | INTRAMUSCULAR | Status: DC | PRN
  Administered 2021-01-08 (×2): 4 mg via INTRAVENOUS
  Filled 2021-01-08 (×3): qty 2

## 2021-01-08 MED ORDER — HYDROMORPHONE HCL 1 MG/ML IJ SOLN
1.0000 mg | Freq: Once | INTRAMUSCULAR | Status: AC
  Administered 2021-01-08: 1 mg via INTRAVENOUS
  Filled 2021-01-08: qty 1

## 2021-01-08 MED ORDER — ONDANSETRON HCL 4 MG PO TABS: 4.0000 mg | ORAL_TABLET | Freq: Four times a day (QID) | ORAL | Status: DC | PRN

## 2021-01-08 MED ORDER — SODIUM CHLORIDE 0.9% FLUSH: 10.0000 mL | INTRAVENOUS | Status: DC | PRN

## 2021-01-08 MED ORDER — CHLORHEXIDINE GLUCONATE CLOTH 2 % EX PADS
6.0000 | MEDICATED_PAD | Freq: Every day | CUTANEOUS | Status: DC
  Administered 2021-01-09: 6 via TOPICAL

## 2021-01-08 MED ORDER — METOCLOPRAMIDE HCL 5 MG/ML IJ SOLN
10.0000 mg | Freq: Once | INTRAMUSCULAR | Status: AC
  Administered 2021-01-08: 10 mg via INTRAVENOUS
  Filled 2021-01-08: qty 2

## 2021-01-08 MED ORDER — HEPARIN SODIUM (PORCINE) 5000 UNIT/ML IJ SOLN
5000.0000 [IU] | Freq: Three times a day (TID) | INTRAMUSCULAR | Status: DC
  Administered 2021-01-08 – 2021-01-10 (×6): 5000 [IU] via SUBCUTANEOUS
  Filled 2021-01-08 (×6): qty 1

## 2021-01-08 MED ORDER — GADOBUTROL 1 MMOL/ML IV SOLN
9.0000 mL | Freq: Once | INTRAVENOUS | Status: AC | PRN
  Administered 2021-01-08: 10 mL via INTRAVENOUS

## 2021-01-08 MED ORDER — METOPROLOL TARTRATE 12.5 MG HALF TABLET
12.5000 mg | ORAL_TABLET | Freq: Two times a day (BID) | ORAL | Status: DC
  Administered 2021-01-08 – 2021-01-11 (×7): 12.5 mg via ORAL
  Filled 2021-01-08 (×7): qty 1

## 2021-01-08 MED ORDER — LABETALOL HCL 5 MG/ML IV SOLN
10.0000 mg | INTRAVENOUS | Status: DC | PRN
  Filled 2021-01-08: qty 4

## 2021-01-08 MED ORDER — HALOPERIDOL LACTATE 5 MG/ML IJ SOLN
5.0000 mg | Freq: Once | INTRAMUSCULAR | Status: AC
  Administered 2021-01-08: 5 mg via INTRAVENOUS
  Filled 2021-01-08: qty 1

## 2021-01-08 MED ORDER — PIPERACILLIN-TAZOBACTAM 3.375 G IVPB 30 MIN
3.3750 g | Freq: Once | INTRAVENOUS | Status: AC
  Administered 2021-01-08: 3.375 g via INTRAVENOUS
  Filled 2021-01-08: qty 50

## 2021-01-08 NOTE — ED Triage Notes (Signed)
Pt arrives with c/o vomiting and upper back pain. Pt reports receiving chemotherapy every Monday for breast cancer.

## 2021-01-08 NOTE — ED Notes (Signed)
Patient transported to X-ray 

## 2021-01-08 NOTE — ED Provider Notes (Addendum)
[facility] DEPT Provider Note   CSN: [id number] Arrival date & time: 01/08/21  0425     History Chief Complaint  Patient presents with  . Vomiting    REIDA HEM is a 55 y.o. female with a history of left breast cancer treated with lumpectomy and currently on adjuvant chemotherapy with weekly Taxol after completing 4 cycles of Adriamycin and Cytoxan, anxiety, anemia who presents the emergency department with a chief complaint of vomiting.  The patient endorses sudden onset nonbloody, nonbilious vomiting that began today accompanied by right-sided mid back pain that shoots around into her right upper quadrant.  She rates the pain a 7 out of 10.  She is unable to characterize the pain.  No known aggravating or alleviating factors.  She reports that the pain began just prior to the onset of vomiting.  States that she has had some the episodes of vomiting that she has been dry heaving and feels very dehydrated.  She attempted to treat her symptoms with her home Zofran, which she states typically manages her symptoms, around 2100 without improvement.  She denies fever, chills, shortness of breath, cough, loss of sense of taste or smell, hematemesis, diarrhea, constipation, dysuria, hematuria, shoulder pain, or rash.  No known treatment prior to arrival.  Reports that she ate spaghetti for dinner.  The history is provided by the patient and medical records. No language interpreter was used.       Past Medical History:  Diagnosis Date  . Allergy    seasonal  . Anemia    After childbirth  . Anxiety   . Breast cancer ([place])   . Family history of breast cancer 07/26/2020  . Family history of colon cancer 07/26/2020  . Family history of prostate cancer 07/26/2020  . GERD (gastroesophageal reflux disease)   . Pneumonia    In 20s    Patient Active Problem List   Diagnosis Date Noted  . RUQ pain 01/08/2021  . Port-A-Cath in place 11/20/2020  . Genetic  testing 08/01/2020  . Family history of breast cancer 07/26/2020  . Family history of prostate cancer 07/26/2020  . Family history of colon cancer 07/26/2020  . Malignant neoplasm of lower-outer quadrant of left breast of female, estrogen receptor positive ([place]) 07/20/2020    Past Surgical History:  Procedure Laterality Date  . AXILLARY LYMPH NODE DISSECTION Left 08/16/2020   Procedure: LEFT TARGETED AXILLARY LYMPH NODE DISSECTION;  Surgeon: [name], MD;  Location: [facility];  Service: General;  Laterality: Left;  . BREAST LUMPECTOMY WITH RADIOACTIVE SEED AND AXILLARY LYMPH NODE DISSECTION Left 08/16/2020   Procedure: LEFT BREAST LUMPECTOMY WITH RADIOACTIVE SEED;  Surgeon: [name], MD;  Location: [facility];  Service: General;  Laterality: Left;  PEC BLOCK  . BREAST SURGERY Left 06/2020   breast bx   . CERVICAL BIOPSY  W/ LOOP ELECTRODE EXCISION  12/2017  . CESAREAN SECTION    . DILATION AND CURETTAGE OF UTERUS    . PORTACATH PLACEMENT  09/12/2020   Procedure: INSERTION PORT-A-CATH WITH ULTRASOUND;  Surgeon: [name], MD;  Location: [facility];  Service: General;;  . WISDOM TOOTH EXTRACTION  2020     OB History    Gravida  3   Para  2   Term  2   Preterm      AB  1   Living  2     SAB  1   IAB      Ectopic  Multiple      Live Births  2           Family History  Problem Relation Age of Onset  . Breast cancer Mother        dx 9  . Prostate cancer Father        dx late 50s/early 74s  . Colon cancer Maternal Grandmother        early 54s  . Breast cancer Cousin        dx 78  . Esophageal cancer Maternal Grandfather        dx 29s  . Cancer Maternal Aunt        maternal grandmother's sister; possible ovarian? dx 34, d. 30s-80s  . Rectal cancer Neg Hx   . Stomach cancer Neg Hx     Social History   Tobacco Use  . Smoking status: Never Smoker  . Smokeless tobacco: Never Used  Vaping Use  . Vaping Use: Never used  Substance Use Topics  .  Alcohol use: Not Currently    Comment: not since breast cancer dx  . Drug use: No    Home Medications Prior to Admission medications   Medication Sig Start Date End Date Taking? Authorizing Provider  ALPRAZolam (XANAX) 0.25 MG tablet Take 1 tablet (0.25 mg total) by mouth 2 (two) times daily as needed for anxiety. 09/04/20  Yes [name], MD  dexamethasone (DECADRON) 4 MG tablet Take 1 tablet (4 mg total) by mouth as needed (daily as needed for nausea). 12/13/20  Yes [name], MD  lidocaine-prilocaine (EMLA) cream Apply to affected area once Patient taking differently: Apply 1 application topically as needed (port access). 09/04/20  Yes [name], MD  ondansetron (ZOFRAN-ODT) 8 MG disintegrating tablet TAKE 1 TABLET BY MOUTH EVERY 8 HOURS AS NEEDED FOR NAUSEA OR VOMITING. Patient taking differently: Take 8 mg by mouth every 8 (eight) hours as needed for nausea or vomiting. 01/02/21  Yes [name], MD  prochlorperazine (COMPAZINE) 10 MG tablet Take 1 tablet (10 mg total) by mouth every 6 (six) hours as needed (Nausea or vomiting). 11/28/20  Yes [name], MD  sodium chloride (OCEAN) 0.65 % SOLN nasal spray Place 1 spray into both nostrils as needed for congestion.   Yes [provider]    Allergies    Patient has no known allergies.  Review of Systems   Review of Systems  Constitutional: Negative for activity change, chills and fever.  HENT: Negative for congestion and sore throat.   Eyes: Negative for photophobia and visual disturbance.  Respiratory: Negative for cough, shortness of breath and wheezing.   Cardiovascular: Positive for chest pain. Negative for palpitations and leg swelling.  Gastrointestinal: Positive for abdominal pain, nausea and vomiting. Negative for anal bleeding, blood in stool, constipation, diarrhea and rectal pain.  Genitourinary: Positive for flank pain. Negative for dysuria, frequency, hematuria, pelvic pain, urgency, vaginal bleeding,  vaginal discharge and vaginal pain.  Musculoskeletal: Negative for back pain, joint swelling, neck pain and neck stiffness.  Skin: Negative for color change, rash and wound.  Allergic/Immunologic: Negative for immunocompromised state.  Neurological: Negative for dizziness, seizures, syncope, weakness, numbness and headaches.  Psychiatric/Behavioral: Negative for confusion.    Physical Exam Updated Vital Signs BP (!) 158/70   Pulse 94   Temp 98 F (36.7 C)   Resp 18   Ht 5\' 6"  (1.676 m)   Wt 99.8 kg   SpO2 98%   BMI 35.51 kg/m   Physical Exam  Vitals and nursing note reviewed.  Constitutional:      General: She is in acute distress.     Appearance: She is obese. She is not ill-appearing, toxic-appearing or diaphoretic.     Comments: Patient is actively retching at bedside.  HENT:     Head: Normocephalic.     Mouth/Throat:     Mouth: Mucous membranes are moist.     Pharynx: No oropharyngeal exudate or posterior oropharyngeal erythema.  Eyes:     Conjunctiva/sclera: Conjunctivae normal.  Cardiovascular:     Rate and Rhythm: Normal rate and regular rhythm.     Pulses: Normal pulses.     Heart sounds: Normal heart sounds. No murmur heard. No friction rub. No gallop.   Pulmonary:     Effort: Pulmonary effort is normal. No respiratory distress.     Breath sounds: No stridor. No wheezing, rhonchi or rales.  Chest:     Chest wall: No tenderness.  Abdominal:     General: There is no distension.     Palpations: Abdomen is soft. There is no mass.     Tenderness: There is abdominal tenderness. There is guarding. There is no right CVA tenderness, left CVA tenderness or rebound.     Hernia: No hernia is present.     Comments: Tender palpation of the right upper quadrant with positive Murphy sign.  Patient's pain is reproduced with palpation of the right upper quadrant. Abdomen is soft and nondistended.  No tenderness over McBurney's point.  No CVA tenderness bilaterally.  Normoactive  bowel sounds.  No rebound or guarding.  Musculoskeletal:     Cervical back: Neck supple.     Right lower leg: No edema.     Left lower leg: No edema.  Skin:    General: Skin is warm.     Findings: No rash.  Neurological:     Mental Status: She is alert.  Psychiatric:        Behavior: Behavior normal.     ED Results / Procedures / Treatments   Labs (all labs ordered are listed, but only abnormal results are displayed) Labs Reviewed  CBC WITH DIFFERENTIAL/PLATELET - Abnormal; Notable for the following components:      Result Value   RBC 3.32 (*)    Hemoglobin 10.9 (*)    HCT 33.4 (*)    MCV 100.6 (*)    nRBC 0.3 (*)    All other components within normal limits  COMPREHENSIVE METABOLIC PANEL - Abnormal; Notable for the following components:   Glucose, Bld 191 (*)    AST 104 (*)    ALT 94 (*)    Total Bilirubin 2.0 (*)    All other components within normal limits  TROPONIN I (HIGH SENSITIVITY) - Abnormal; Notable for the following components:   Troponin I (High Sensitivity) 19 (*)    All other components within normal limits  TROPONIN I (HIGH SENSITIVITY) - Abnormal; Notable for the following components:   Troponin I (High Sensitivity) 28 (*)    All other components within normal limits  RESP PANEL BY RT-PCR (FLU A&B, COVID) ARPGX2  LIPASE, BLOOD  URINALYSIS, ROUTINE W REFLEX MICROSCOPIC  RAPID URINE DRUG SCREEN, HOSP PERFORMED  HIV ANTIBODY (ROUTINE TESTING W REFLEX)    EKG EKG Interpretation  Date/Time:  Monday January 08 2021 05:07:12 EST Ventricular Rate:  100 PR Interval:    QRS Duration: 91 QT Interval:  339 QTC Calculation: 438 R Axis:   22 Text Interpretation: Sinus tachycardia Confirmed  by Veatrice Kells [id number]) on 01/08/2021 5:09:14 AM   Radiology DG Chest Portable 1 View  Result Date: 01/08/2021 CLINICAL DATA:  Chest pain, vomiting, upper back EXAM: PORTABLE CHEST 1 VIEW COMPARISON:  Chest x-ray 09/12/2020. FINDINGS: Right chest wall Port-A-Cath  with tip overlying the expected region of the superior cavoatrial junction. The heart size and mediastinal contours are within normal limits. No focal consolidation. No pulmonary edema. No pleural effusion. No pneumothorax. No acute osseous abnormality. Partially visualized foci of gas within the right upper quadrant of unclear etiology. IMPRESSION: 1. No active disease. 2. Partially visualized foci of gas within the right upper quadrant of unclear etiology. Finding likely represents gas within the large bowel. Recommend x-ray abdomen for further evaluation. Electronically Signed   By: Iven Finn M.D.   On: 01/08/2021 05:56   CT Angio Chest/Abd/Pel for Dissection W and/or Wo Contrast  Result Date: 01/08/2021 CLINICAL DATA:  Left-sided breast cancer undergoing chemotherapy. Vomiting and upper back pain EXAM: CT ANGIOGRAPHY CHEST, ABDOMEN AND PELVIS TECHNIQUE: Non-contrast CT of the chest was initially obtained. Multidetector CT imaging through the chest, abdomen and pelvis was performed using the standard protocol during bolus administration of intravenous contrast. Multiplanar reconstructed images and MIPs were obtained and reviewed to evaluate the vascular anatomy. CONTRAST:  122mL OMNIPAQUE IOHEXOL 350 MG/ML SOLN COMPARISON:  None. FINDINGS: CTA CHEST FINDINGS Cardiovascular: Normal heart size. No pericardial effusion. No aortic dissection, aneurysm, or intramural hematoma. Porta catheter on the right with tip in good position at the upper cavoatrial junction. Mediastinum/Nodes: Negative for adenopathy or mass. Postoperative left breast. Lungs/Pleura: The central airways are clear. There is no edema, consolidation, effusion, or pneumothorax. Musculoskeletal: Patchy areas of thoracic spine sclerosis which are adjacent endplates and appear reactive. No acute finding. Review of the MIP images confirms the above findings. CTA ABDOMEN AND PELVIS FINDINGS VASCULAR Aorta: Normal caliber aorta without aneurysm,  dissection, vasculitis or significant stenosis. Celiac: Patent without evidence of aneurysm, dissection, vasculitis or significant stenosis. SMA: Patent without evidence of aneurysm, dissection, vasculitis or significant stenosis. Renals: Unremarkable IMA: Patent Inflow: Unremarkable Veins: Unremarkable in the venous phase Review of the MIP images confirms the above findings. NON-VASCULAR Hepatobiliary: No focal liver abnormality.Distended gallbladder and bile ducts. The common bile duct measures up to 1 cm in diameter. No visible choledocholithiasis. There is a calcified gallstone. Pancreas: Mild stranding around the pancreas, especially at the body and tail. No fluid collection or detectable necrosis. Spleen: Unremarkable. Adrenals/Urinary Tract: Negative adrenals. No hydronephrosis or stone. Unremarkable bladder. Stomach/Bowel:  No obstruction. No appendicitis. Lymphatic: No mass or adenopathy. Reproductive:2.4 cm subserosal fibroid. Other: No ascites or pneumoperitoneum. Musculoskeletal: No acute abnormalities. Review of the MIP images confirms the above findings. IMPRESSION: Suspect mild acute edematous pancreatitis. There is cholelithiasis with biliary and gallbladder distension but no detected choledocholithiasis, consider MRCP. Normal CTA of the aorta. Electronically Signed   By: Monte Fantasia M.D.   On: 01/08/2021 07:28   US Abdomen Limited RUQ (LIVER/GB)  Result Date: 01/08/2021 CLINICAL DATA:  Right upper quadrant pain X 1 day. History of breast cancer. EXAM: ULTRASOUND ABDOMEN LIMITED RIGHT UPPER QUADRANT COMPARISON:  None. FINDINGS: Gallbladder: 8 mm calcified gallstone noted within the gallbladder lumen. No pericholecystic fluid or wall thickening visualized. No sonographic Murphy sign noted by sonographer. Intrahepatic biliary ducts are measuring at the upper limits of normal. Common bile duct: Diameter: Dilated measuring up to 10 mm. Liver: No focal lesion identified. Within normal limits in  parenchymal echogenicity. Portal vein is  patent on color Doppler imaging with normal direction of blood flow towards the liver. Other: None. IMPRESSION: 1. Cholelithiasis with no findings of acute cholecystitis. 2. Enlarged common bile duct concerning for choledocholithiasis. Correlate with liver function tests, and if clinically indicated consider MRCP for further evaluation. Electronically Signed   By: Iven Finn M.D.   On: 01/08/2021 05:59    Procedures .Critical Care Performed by: Joanne Gavel, PA-C Authorized by: Joanne Gavel, PA-C   Critical care provider statement:    Critical care time (minutes):  40   Critical care time was exclusive of:  Separately billable procedures and treating other patients and teaching time   Critical care was necessary to treat or prevent imminent or life-threatening deterioration of the following conditions: Elevated troponin.   Critical care was time spent personally by me on the following activities:  Ordering and performing treatments and interventions, ordering and review of laboratory studies, ordering and review of radiographic studies, re-evaluation of patient's condition, pulse oximetry, obtaining history from patient or surrogate, examination of patient, evaluation of patient's response to treatment, discussions with consultants and development of treatment plan with patient or surrogate   I assumed direction of critical care for this patient from another provider in my specialty: no       Medications Ordered in ED Medications  morphine 2 MG/ML injection 2 mg (has no administration in time range)  heparin injection 5,000 Units (has no administration in time range)  lactated ringers infusion (has no administration in time range)  ondansetron (ZOFRAN) tablet 4 mg (has no administration in time range)    Or  ondansetron (ZOFRAN) injection 4 mg (has no administration in time range)  labetalol (NORMODYNE) injection 10 mg (has no  administration in time range)  sodium chloride 0.9 % bolus 1,000 mL (1,000 mLs Intravenous New Bag/Given 01/08/21 0529)  HYDROmorphone (DILAUDID) injection 1 mg (1 mg Intravenous Given 01/08/21 0527)  metoCLOPramide (REGLAN) injection 10 mg (10 mg Intravenous Given 01/08/21 0527)  haloperidol lactate (HALDOL) injection 5 mg (5 mg Intravenous Given 01/08/21 0529)  iohexol (OMNIPAQUE) 350 MG/ML injection 100 mL (100 mLs Intravenous Contrast Given 01/08/21 0654)  piperacillin-tazobactam (ZOSYN) IVPB 3.375 g (3.375 g Intravenous New Bag/Given 01/08/21 0743)  HYDROmorphone (DILAUDID) injection 1 mg (1 mg Intravenous Given 01/08/21 8657)    ED Course  I have reviewed the triage vital signs and the nursing notes.  Pertinent labs & imaging results that were available during my care of the patient were reviewed by me and considered in my medical decision making (see chart for details).  Clinical Course as of 01/08/21 0840  Mon Jan 08, 2021  0650 GI notified by Dr. Randal Buba.  [MM]    Clinical Course User Index [MM] Annabelle Rexroad, Laymond Purser, PA-C   MDM Rules/Calculators/A&P                          55 year old female with a history of left breast cancer treated with lumpectomy and currently on adjuvant chemotherapy with weekly Taxol after completing 4 cycles of Adriamycin and Cytoxan, anxiety, anemia who presents to the emergency department with nausea, vomiting, and right sided mid back pain that radiates through her right lower chest and right upper quadrant.  No constitutional symptoms.  Hypertensive and tachycardic on arrival.  Afebrile without tachypnea or hypoxia.  On exam, patient is actively retching.  She is tender to palpation in the right upper quadrant.  Patient has been seen and  independently evaluated by Dr. Nicholes Stairs, attending physician.  Labs and imaging have been reviewed and independently interpreted by me.  Patient's pain is reproduced with palpation of the right upper quadrant.  No peritoneal  signs.  Differential diagnosis includes aortic dissection, PE, obstructive uropathy, pyelonephritis, perinephric abscess, tension pneumothorax, ACS, choledocholithiasis, cholecystitis, pancreatitis, esophageal rupture, or pathologic fracture.  Patient was given Reglan, Dilaudid for his symptoms.  She continued to have retching, which improved after Haldol.  IV fluid bolus given.  EKG with sinus tachycardia.  No evidence of ischemia.  No prolonged QTC.   Initial troponin is elevated at 19.  Repeat troponin is pending.  AST and ALT are elevated at 104 94 respectively.  Total bilirubin is elevated at 2.0.  No leukocytosis.  No metabolic derangements.  Chest x-ray with partially visualized foci of gas in the right upper quadrant of unknown etiology.  Abdominal x-ray recommended for further evaluation.  Right upper quadrant ultrasound with cholelithiasis.  Common bile duct is enlarged to 10 mm, up from 4 mm in 2019.  This is concerning for choledocholithiasis and correlation with liver function test is recommended.  MRCP may be clinically indicated.  Given concern for foci of air with active cancer on a patient who is not anticoagulated with both chest and abdominal pain, and Dr. Nicholes Stairs recommends CTA dissection study.  Gallbladder can also be further evaluated on the CT.  Could also consider PE, but a large central PE could also be evaluated on this study.  Given concern for choledocholithiasis, GI has been consulted by Dr. Nicholes Stairs.   Patient care transferred to [facility] at the end of my shift to follow-up on GI consult and CTA. Patient presentation, ED course, and plan of care discussed with review of all pertinent labs and imaging. Please see his/her note for further details regarding further ED course and disposition.  Final Clinical Impression(s) / ED Diagnoses Final diagnoses:  RUQ abdominal pain    Rx / DC Orders ED Discharge Orders    None       Halil Rentz, Laymond Purser, PA-C 01/08/21  0734    Joanne Gavel, PA-C 01/08/21 0840    Palumbo, April, MD 01/08/21 2305

## 2021-01-08 NOTE — Consult Note (Signed)
Referring Physician: Marva Panda, MD  Lindsay Hampton is an 55 y.o. female.                       Chief Complaint: Abnormal troponin I in breast cancer patient  HPI: 55 years old white female with PMH of left breast cancer, s/p lumpectomy and current chemotherapy with Adriamycin and Cytoxan has nausea, vomiting and RUQ and mid back pain. Her CT scan was negative for aortic dissection but positive for cholelithiasis with possible choledocholithiasis. She has minimally elevated cardiac torponin I levels. Her EKG is unremarkable. Her echocardiogram in 08/2020 was also unremarkable. She denies chest pain or shortness of breath with activity.  Past Medical History:  Diagnosis Date  . Allergy    seasonal  . Anemia    After childbirth  . Anxiety   . Breast cancer ([place])   . Family history of breast cancer 07/26/2020  . Family history of colon cancer 07/26/2020  . Family history of prostate cancer 07/26/2020  . GERD (gastroesophageal reflux disease)   . Hypertensive urgency 01/08/2021  . Pneumonia    In 20s      Past Surgical History:  Procedure Laterality Date  . AXILLARY LYMPH NODE DISSECTION Left 08/16/2020   Procedure: LEFT TARGETED AXILLARY LYMPH NODE DISSECTION;  Surgeon: [name], MD;  Location: [facility];  Service: General;  Laterality: Left;  . BREAST LUMPECTOMY WITH RADIOACTIVE SEED AND AXILLARY LYMPH NODE DISSECTION Left 08/16/2020   Procedure: LEFT BREAST LUMPECTOMY WITH RADIOACTIVE SEED;  Surgeon: [name], MD;  Location: [facility];  Service: General;  Laterality: Left;  PEC BLOCK  . BREAST SURGERY Left 06/2020   breast bx   . CERVICAL BIOPSY  W/ LOOP ELECTRODE EXCISION  12/2017  . CESAREAN SECTION    . DILATION AND CURETTAGE OF UTERUS    . PORTACATH PLACEMENT  09/12/2020   Procedure: INSERTION PORT-A-CATH WITH ULTRASOUND;  Surgeon: [name], MD;  Location: [facility];  Service: General;;  . WISDOM TOOTH EXTRACTION  2020    Family History  Problem Relation Age of Onset  .  Breast cancer Mother        dx 1  . Prostate cancer Father        dx late 50s/early 61s  . Colon cancer Maternal Grandmother        early 65s  . Breast cancer Cousin        dx 56  . Esophageal cancer Maternal Grandfather        dx 77s  . Cancer Maternal Aunt        maternal grandmother's sister; possible ovarian? dx 76, d. 68s-80s  . Rectal cancer Neg Hx   . Stomach cancer Neg Hx    Social History:  reports that she has never smoked. She has never used smokeless tobacco. She reports previous alcohol use. She reports that she does not use drugs.  Allergies: No Known Allergies  Medications Prior to Admission  Medication Sig Dispense Refill  . ALPRAZolam (XANAX) 0.25 MG tablet Take 1 tablet (0.25 mg total) by mouth 2 (two) times daily as needed for anxiety. 60 tablet 3  . dexamethasone (DECADRON) 4 MG tablet Take 1 tablet (4 mg total) by mouth as needed (daily as needed for nausea). 30 tablet 0  . lidocaine-prilocaine (EMLA) cream Apply to affected area once (Patient taking differently: Apply 1 application topically as needed (port access).) 30 g 3  . ondansetron (ZOFRAN-ODT) 8 MG disintegrating tablet TAKE  1 TABLET BY MOUTH EVERY 8 HOURS AS NEEDED FOR NAUSEA OR VOMITING. (Patient taking differently: Take 8 mg by mouth every 8 (eight) hours as needed for nausea or vomiting.) 20 tablet 1  . prochlorperazine (COMPAZINE) 10 MG tablet Take 1 tablet (10 mg total) by mouth every 6 (six) hours as needed (Nausea or vomiting). 30 tablet 1  . sodium chloride (OCEAN) 0.65 % SOLN nasal spray Place 1 spray into both nostrils as needed for congestion.      Results for orders placed or performed during the hospital encounter of 01/08/21 (from the past 48 hour(s))  CBC with Differential     Status: Abnormal   Collection Time: 01/08/21  5:35 AM  Result Value Ref Range   WBC 5.8 4.0 - 10.5 K/uL   RBC 3.32 (L) 3.87 - 5.11 MIL/uL   Hemoglobin 10.9 (L) 12.0 - 15.0 g/dL   HCT 33.4 (L) 36.0 - 46.0 %    MCV 100.6 (H) 80.0 - 100.0 fL   MCH 32.8 26.0 - 34.0 pg   MCHC 32.6 30.0 - 36.0 g/dL   RDW 14.6 11.5 - 15.5 %   Platelets 330 150 - 400 K/uL   nRBC 0.3 (H) 0.0 - 0.2 %   Neutrophils Relative % 81 %   Neutro Abs 4.7 1.7 - 7.7 K/uL   Lymphocytes Relative 12 %   Lymphs Abs 0.7 0.7 - 4.0 K/uL   Monocytes Relative 5 %   Monocytes Absolute 0.3 0.1 - 1.0 K/uL   Eosinophils Relative 0 %   Eosinophils Absolute 0.0 0.0 - 0.5 K/uL   Basophils Relative 1 %   Basophils Absolute 0.1 0.0 - 0.1 K/uL   Immature Granulocytes 1 %   Abs Immature Granulocytes 0.06 0.00 - 0.07 K/uL    Comment: Performed at [facility], [place] [address], [place], [place] [address]  Comprehensive metabolic panel     Status: Abnormal   Collection Time: 01/08/21  5:35 AM  Result Value Ref Range   Sodium 136 135 - 145 mmol/L   Potassium 3.8 3.5 - 5.1 mmol/L   Chloride 102 98 - 111 mmol/L   CO2 22 22 - 32 mmol/L   Glucose, Bld 191 (H) 70 - 99 mg/dL    Comment: Glucose reference range applies only to samples taken after fasting for at least 8 hours.   BUN 15 6 - 20 mg/dL   Creatinine, Ser 0.62 0.44 - 1.00 mg/dL   Calcium 9.7 8.9 - 10.3 mg/dL   Total Protein 7.2 6.5 - 8.1 g/dL   Albumin 4.1 3.5 - 5.0 g/dL   AST 104 (H) 15 - 41 U/L   ALT 94 (H) 0 - 44 U/L   Alkaline Phosphatase 74 38 - 126 U/L   Total Bilirubin 2.0 (H) 0.3 - 1.2 mg/dL   GFR, Estimated >60 >60 mL/min    Comment: (NOTE) Calculated using the CKD-EPI Creatinine Equation (2021)    Anion gap 12 5 - 15    Comment: Performed at [facility], [place] [address], [place], [place] [address]  Lipase, blood     Status: None   Collection Time: 01/08/21  5:35 AM  Result Value Ref Range   Lipase 25 11 - 51 U/L    Comment: Performed at [facility], [place] [address], [place], [place] [address]  Troponin I (High Sensitivity)     Status: Abnormal   Collection Time: 01/08/21  5:35 AM  Result Value Ref Range  Troponin I (High Sensitivity) 19 (H) <18 ng/L    Comment: (NOTE) Elevated high sensitivity troponin I (hsTnI) values and significant  changes across serial measurements may suggest ACS but many other  chronic and acute conditions are known to elevate hsTnI results.  Refer to the "Links" section for chest pain algorithms and additional  guidance. Performed at [facility], [place] [address] [place], [place] [address]   Resp Panel by RT-PCR (Flu A&B, Covid) Nasopharyngeal Swab     Status: None   Collection Time: 01/08/21  6:41 AM   Specimen: Nasopharyngeal Swab; Nasopharyngeal(NP) swabs in vial transport medium  Result Value Ref Range   SARS Coronavirus 2 by RT PCR NEGATIVE NEGATIVE    Comment: (NOTE) SARS-CoV-2 target nucleic acids are NOT DETECTED.  The SARS-CoV-2 RNA is generally detectable in upper respiratory specimens during the acute phase of infection. The lowest concentration of SARS-CoV-2 viral copies this assay can detect is 138 copies/mL. A negative result does not preclude SARS-Cov-2 infection and should not be used as the sole basis for treatment or other patient management decisions. A negative result may occur with  improper specimen collection/handling, submission of specimen other than nasopharyngeal swab, presence of viral mutation(s) within the areas targeted by this assay, and inadequate number of viral copies(<138 copies/mL). A negative result must be combined with clinical observations, patient history, and epidemiological information. The expected result is Negative.  Fact Sheet for Patients:  EntrepreneurPulse.com.au  Fact Sheet for Healthcare Providers:  IncredibleEmployment.be  This test is no t yet approved or cleared by the [place] FDA and  has been authorized for detection and/or diagnosis of SARS-CoV-2 by FDA under an Emergency Use Authorization (EUA). This EUA will remain  in effect  (meaning this test can be used) for the duration of the COVID-19 declaration under Section 564(b)(1) of the Act, 21 U.S.C.section 360bbb-3(b)(1), unless the authorization is terminated  or revoked sooner.       Influenza A by PCR NEGATIVE NEGATIVE   Influenza B by PCR NEGATIVE NEGATIVE    Comment: (NOTE) The Xpert Xpress SARS-CoV-2/FLU/RSV plus assay is intended as an aid in the diagnosis of influenza from Nasopharyngeal swab specimens and should not be used as a sole basis for treatment. Nasal washings and aspirates are unacceptable for Xpert Xpress SARS-CoV-2/FLU/RSV testing.  Fact Sheet for Patients: EntrepreneurPulse.com.au  Fact Sheet for Healthcare Providers: IncredibleEmployment.be  This test is not yet approved or cleared by the [place] FDA and has been authorized for detection and/or diagnosis of SARS-CoV-2 by FDA under an Emergency Use Authorization (EUA). This EUA will remain in effect (meaning this test can be used) for the duration of the COVID-19 declaration under Section 564(b)(1) of the Act, 21 U.S.C. section 360bbb-3(b)(1), unless the authorization is terminated or revoked.  Performed at [facility], [place] [address], [place], [place] [address]   Troponin I (High Sensitivity)     Status: Abnormal   Collection Time: 01/08/21  7:46 AM  Result Value Ref Range   Troponin I (High Sensitivity) 28 (H) <18 ng/L    Comment: (NOTE) Elevated high sensitivity troponin I (hsTnI) values and significant  changes across serial measurements may suggest ACS but many other  chronic and acute conditions are known to elevate hsTnI results.  Refer to the "Links" section for chest pain algorithms and additional  guidance. Performed at [facility], [place] [address] [place],  [address]    DG Chest Portable 1 View  Result Date: 01/08/2021 CLINICAL DATA:  Chest pain, vomiting, upper back EXAM:  PORTABLE CHEST 1 VIEW COMPARISON:  Chest x-ray 09/12/2020. FINDINGS: Right chest wall Port-A-Cath with tip overlying the expected region of the superior cavoatrial junction. The heart size and mediastinal contours are within normal limits. No focal consolidation. No pulmonary edema. No pleural effusion. No pneumothorax. No acute osseous abnormality. Partially visualized foci of gas within the right upper quadrant of unclear etiology. IMPRESSION: 1. No active disease. 2. Partially visualized foci of gas within the right upper quadrant of unclear etiology. Finding likely represents gas within the large bowel. Recommend x-ray abdomen for further evaluation. Electronically Signed   By: Iven Finn M.D.   On: 01/08/2021 05:56   CT Angio Chest/Abd/Pel for Dissection W and/or Wo Contrast  Result Date: 01/08/2021 CLINICAL DATA:  Left-sided breast cancer undergoing chemotherapy. Vomiting and upper back pain EXAM: CT ANGIOGRAPHY CHEST, ABDOMEN AND PELVIS TECHNIQUE: Non-contrast CT of the chest was initially obtained. Multidetector CT imaging through the chest, abdomen and pelvis was performed using the standard protocol during bolus administration of intravenous contrast. Multiplanar reconstructed images and MIPs were obtained and reviewed to evaluate the vascular anatomy. CONTRAST:  170mL OMNIPAQUE IOHEXOL 350 MG/ML SOLN COMPARISON:  None. FINDINGS: CTA CHEST FINDINGS Cardiovascular: Normal heart size. No pericardial effusion. No aortic dissection, aneurysm, or intramural hematoma. Porta catheter on the right with tip in good position at the upper cavoatrial junction. Mediastinum/Nodes: Negative for adenopathy or mass. Postoperative left breast. Lungs/Pleura: The central airways are clear. There is no edema, consolidation, effusion, or pneumothorax. Musculoskeletal: Patchy areas of thoracic spine sclerosis which are adjacent endplates and appear reactive. No acute finding. Review of the MIP images confirms the above  findings. CTA ABDOMEN AND PELVIS FINDINGS VASCULAR Aorta: Normal caliber aorta without aneurysm, dissection, vasculitis or significant stenosis. Celiac: Patent without evidence of aneurysm, dissection, vasculitis or significant stenosis. SMA: Patent without evidence of aneurysm, dissection, vasculitis or significant stenosis. Renals: Unremarkable IMA: Patent Inflow: Unremarkable Veins: Unremarkable in the venous phase Review of the MIP images confirms the above findings. NON-VASCULAR Hepatobiliary: No focal liver abnormality.Distended gallbladder and bile ducts. The common bile duct measures up to 1 cm in diameter. No visible choledocholithiasis. There is a calcified gallstone. Pancreas: Mild stranding around the pancreas, especially at the body and tail. No fluid collection or detectable necrosis. Spleen: Unremarkable. Adrenals/Urinary Tract: Negative adrenals. No hydronephrosis or stone. Unremarkable bladder. Stomach/Bowel:  No obstruction. No appendicitis. Lymphatic: No mass or adenopathy. Reproductive:2.4 cm subserosal fibroid. Other: No ascites or pneumoperitoneum. Musculoskeletal: No acute abnormalities. Review of the MIP images confirms the above findings. IMPRESSION: Suspect mild acute edematous pancreatitis. There is cholelithiasis with biliary and gallbladder distension but no detected choledocholithiasis, consider MRCP. Normal CTA of the aorta. Electronically Signed   By: Monte Fantasia M.D.   On: 01/08/2021 07:28   US Abdomen Limited RUQ (LIVER/GB)  Result Date: 01/08/2021 CLINICAL DATA:  Right upper quadrant pain X 1 day. History of breast cancer. EXAM: ULTRASOUND ABDOMEN LIMITED RIGHT UPPER QUADRANT COMPARISON:  None. FINDINGS: Gallbladder: 8 mm calcified gallstone noted within the gallbladder lumen. No pericholecystic fluid or wall thickening visualized. No sonographic Murphy sign noted by sonographer. Intrahepatic biliary ducts are measuring at the upper limits of normal. Common bile duct:  Diameter: Dilated measuring up to 10 mm. Liver: No focal lesion identified. Within normal limits in parenchymal echogenicity. Portal vein is patent on color Doppler imaging with normal direction of blood flow towards the liver. Other: None. IMPRESSION:  1. Cholelithiasis with no findings of acute cholecystitis. 2. Enlarged common bile duct concerning for choledocholithiasis. Correlate with liver function tests, and if clinically indicated consider MRCP for further evaluation. Electronically Signed   By: Iven Finn M.D.   On: 01/08/2021 05:59    Review Of Systems Constitutional: No fever, chills, weight loss or gain. Eyes: No vision change, wears glasses. No discharge or pain. Ears: No hearing loss, No tinnitus. Respiratory: No asthma, COPD, h/o pneumonia. No shortness of breath. No hemoptysis. Cardiovascular: No chest pain, palpitation, leg edema. Gastrointestinal: Positive nausea, vomiting, no diarrhea, constipation. No GI bleed. No hepatitis. Genitourinary: No dysuria, hematuria, kidney stone. No incontinance. Neurological: No headache, stroke, seizures.  Psychiatry: No psych facility admission for anxiety, depression, suicide. No detox. Skin: No rash. Musculoskeletal: No joint pain, fibromyalgia. No neck pain, back pain. Lymphadenopathy: No lymphadenopathy. Hematology: No anemia or easy bruising.   Blood pressure (!) 152/89, pulse 96, temperature 97.6 F (36.4 C), temperature source Oral, resp. rate 18, height 5\' 6"  (1.676 m), weight 99.8 kg, SpO2 98 %. Body mass index is 35.51 kg/m. General appearance: alert, cooperative, appears stated age and no distress Head: Normocephalic, atraumatic. Eyes: Blue eyes, pink conjunctiva, corneas clear.  Neck: No adenopathy, no carotid bruit, no JVD, supple, symmetrical, trachea midline and thyroid not enlarged. Resp: Clearing to auscultation bilaterally. Cardio: Regular rate and rhythm, S1, S2 normal, II/VI systolic murmur, no click, rub or  gallop GI: Soft, epigastric area tender; bowel sounds normal; no organomegaly. Extremities: No edema, cyanosis or clubbing. Skin: Warm and dry.  Neurologic: Alert and oriented X 3, normal strength.   Assessment/Plan Abdominal pain r/o cholecystitis/Choledocholithiasis Nausea and vomiting from above  Abnormal troponin I from demand ischemia Left breast cancer, s/p chemotherapy Hyperglycemia Obesity Hypertnesion  Continue medical therapy. Repeat echocardiogram for LV EF for cancer chemotherapy. Add small dose B-blocker for sinus tachycardia.  Time spent: Review of old records, Lab, x-rays, EKG, other cardiac tests, examination, discussion with patient/Nurse/Doctor over 70 minutes.  Birdie Riddle, MD  01/08/2021, 10:22 AM

## 2021-01-08 NOTE — ED Notes (Signed)
Patient transported to CT 

## 2021-01-08 NOTE — Consult Note (Addendum)
Referring Provider:  [facility]         Primary Care Physician:  [name], [employer] Primary Gastroenterologist:   Harl Bowie, MD            We were asked to see this patient for:   Abdominal pain possible cbd stone               ASSESSMENT / PLAN:   # 55 yo female admitted with two-three weeks of intermittent nausea, vomiting, RUQ pain radiating through to back. No evidence for cholecystitis on imaging. CT scan suggests mild pancreatitis but lipase normal. She does have cholelithiasis and an enlarged CBD on [place] and CT scan but liver chemistries only mildly elevated.  --Will obtain MRCP to evaluate for choledocholithiasis. Depending on results she will need ERCP for stone extraction, followed at some point by cholecystectomy.  --am liver chemistries  # Left breast cancer s/p lumpectomy, current on chemotherapy. Mild anemia, hgb 10.9 . WBC normal.   # Elevated Troponin. CTA chest negative for aortic dissection or other acute findings.  Cardiology has been consulted  # Hypertensive urgency. Elevated BP felt to be secondary to pain.     HPI:                                                                                                                             Chief Complaint:  abdomiinal pain and nausea  Lindsay Hampton is a 55 y.o. female with pmh of tubular and sessile serrated colon polyps, diverticulosis, Stage II A left breast cancer diagnosed August 2021 undergoing chemotherapy, cholelithiasis.   Patient presented to ED this am with vomiting and upper abdominal pain She started chemothearpy in October. Two -there weeks ago Lindsay Hampton began having intermittent nausea and vomiting associated with crampy RUQ pain radiating through to her back. Last night she had [place] food for dinner. At 9:30 pm she had another episode, this time the crampy pain was much worse and unrelenting so she came to ED this am. No other complains. BMs okay. In she is slightly tachycardic,  afebrile. WBC normal. Hgb at baseline at 10.9. Lipase normal. AST 104, ALT 94, alk phos 74 and Bilirubin 2. She has a history of intermittent, mildly elevated ALT. Troponin 19>>> 28. Renal function normal. Glucose 191  Data reviewed:   RUQ [place] remarkable for cholelithiasis, enlarge CBD concerning for choledocholithiasis. CBD 10 mm  CTA of chest , abdomen and pelvis- mild stranding around pancreas at the body and tail, cholelithiasis and enlarged CBD at 1 cm  EKG Interpretation  Date/Time:                  Monday January 08 2021 05:07:12 EST Ventricular Rate:         100 PR Interval:                   QRS Duration: 91 QT Interval:  339 QTC Calculation:        438 R Axis:                         22 Text Interpretation:      Sinus tachycardia Confirmed by Randal Buba, April ([id number]) on 01/08/2021 5:09:14 AM   PREVIOUS ENDOSCOPIC EVALUATIONS / PERTINENT STUDIES    April 2019 screening colonoscopy  Two sessile polyps were found in the transverse colon. The polyps were 1 to 2 mm in size. These polyps were removed with a cold biopsy forceps. Resection and retrieval were complete. - Two sessile polyps were found in the rectum and transverse colon. The polyps were 4 to 5 mm in size. These polyps were removed with a cold snare. Resection and retrieval were complete. - A few small-mouthed diverticula were found in the sigmoid colon and descending colon. - Non-bleeding internal hemorrhoids were found during retroflexion. The hemorrhoids were small. - The exam was otherwise without abnormality.  Diagnosis 1. Surgical [P], transverse, polyp (3) - TUBULAR ADENOMA (ONE FRAGMENT). - SESSILE SERRATED POLYP WITHOUT DYSPLASIA (ONE FRAGMENT). - BENIGN LYMPHOID POLYP (ONE FRAGMENT). - NO HIGH GRADE DYSPLASIA OR MALIGNANCY. 2. Surgical [P], rectum, polyp - HYPERPLASTIC POLYP (ONE FRAGMENT). - NO ADENOMATOUS CHANGE OR MALIGNANCY  Past Medical History:  Diagnosis Date  . Allergy     seasonal  . Anemia    After childbirth  . Anxiety   . Breast cancer ([place])   . Family history of breast cancer 07/26/2020  . Family history of colon cancer 07/26/2020  . Family history of prostate cancer 07/26/2020  . GERD (gastroesophageal reflux disease)   . Hypertensive urgency 01/08/2021  . Pneumonia    In 20s    Past Surgical History:  Procedure Laterality Date  . AXILLARY LYMPH NODE DISSECTION Left 08/16/2020   Procedure: LEFT TARGETED AXILLARY LYMPH NODE DISSECTION;  Surgeon: [name], MD;  Location: [place];  Service: General;  Laterality: Left;  . BREAST LUMPECTOMY WITH RADIOACTIVE SEED AND AXILLARY LYMPH NODE DISSECTION Left 08/16/2020   Procedure: LEFT BREAST LUMPECTOMY WITH RADIOACTIVE SEED;  Surgeon: [name], MD;  Location: [place];  Service: General;  Laterality: Left;  PEC BLOCK  . BREAST SURGERY Left 06/2020   breast bx   . CERVICAL BIOPSY  W/ LOOP ELECTRODE EXCISION  12/2017  . CESAREAN SECTION    . DILATION AND CURETTAGE OF UTERUS    . PORTACATH PLACEMENT  09/12/2020   Procedure: INSERTION PORT-A-CATH WITH ULTRASOUND;  Surgeon: [name], MD;  Location: [facility];  Service: General;;  . WISDOM TOOTH EXTRACTION  2020    Prior to Admission medications   Medication Sig Start Date End Date Taking? Authorizing Provider  ALPRAZolam (XANAX) 0.25 MG tablet Take 1 tablet (0.25 mg total) by mouth 2 (two) times daily as needed for anxiety. 09/04/20  Yes [name], MD  dexamethasone (DECADRON) 4 MG tablet Take 1 tablet (4 mg total) by mouth as needed (daily as needed for nausea). 12/13/20  Yes [name], MD  lidocaine-prilocaine (EMLA) cream Apply to affected area once Patient taking differently: Apply 1 application topically as needed (port access). 09/04/20  Yes [name], MD  ondansetron (ZOFRAN-ODT) 8 MG disintegrating tablet TAKE 1 TABLET BY MOUTH EVERY 8 HOURS AS NEEDED FOR NAUSEA OR VOMITING. Patient taking differently: Take 8 mg by mouth every 8 (eight)  hours as needed for nausea or vomiting. 01/02/21  Yes [name], MD  prochlorperazine (COMPAZINE) 10 MG  tablet Take 1 tablet (10 mg total) by mouth every 6 (six) hours as needed (Nausea or vomiting). 11/28/20  Yes [name], MD  sodium chloride (OCEAN) 0.65 % SOLN nasal spray Place 1 spray into both nostrils as needed for congestion.   Yes [provider]    Current Facility-Administered Medications  Medication Dose Route Frequency Provider Last Rate Last Admin  . heparin injection 5,000 Units  5,000 Units Subcutaneous Q8H Marva Panda E, MD      . labetalol (NORMODYNE) injection 10 mg  10 mg Intravenous Q2H PRN Harold Hedge, MD      . lactated ringers infusion   Intravenous Continuous Harold Hedge, MD      . morphine 2 MG/ML injection 2 mg  2 mg Intravenous Q2H PRN Harold Hedge, MD      . ondansetron [facility]) tablet 4 mg  4 mg Oral Q6H PRN Harold Hedge, MD       Or  . ondansetron [facility]) injection 4 mg  4 mg Intravenous Q6H PRN Harold Hedge, MD      . piperacillin-tazobactam (ZOSYN) IVPB 3.375 g  3.375 g Intravenous Q8H Harold Hedge, MD       Current Outpatient Medications  Medication Sig Dispense Refill  . ALPRAZolam (XANAX) 0.25 MG tablet Take 1 tablet (0.25 mg total) by mouth 2 (two) times daily as needed for anxiety. 60 tablet 3  . dexamethasone (DECADRON) 4 MG tablet Take 1 tablet (4 mg total) by mouth as needed (daily as needed for nausea). 30 tablet 0  . lidocaine-prilocaine (EMLA) cream Apply to affected area once (Patient taking differently: Apply 1 application topically as needed (port access).) 30 g 3  . ondansetron (ZOFRAN-ODT) 8 MG disintegrating tablet TAKE 1 TABLET BY MOUTH EVERY 8 HOURS AS NEEDED FOR NAUSEA OR VOMITING. (Patient taking differently: Take 8 mg by mouth every 8 (eight) hours as needed for nausea or vomiting.) 20 tablet 1  . prochlorperazine (COMPAZINE) 10 MG tablet Take 1 tablet (10 mg total) by mouth every 6 (six) hours as needed (Nausea  or vomiting). 30 tablet 1  . sodium chloride (OCEAN) 0.65 % SOLN nasal spray Place 1 spray into both nostrils as needed for congestion.      Allergies as of 01/08/2021  . (No Known Allergies)    Family History  Problem Relation Age of Onset  . Breast cancer Mother        dx 22  . Prostate cancer Father        dx late 50s/early 74s  . Colon cancer Maternal Grandmother        early 75s  . Breast cancer Cousin        dx 50  . Esophageal cancer Maternal Grandfather        dx 64s  . Cancer Maternal Aunt        maternal grandmother's sister; possible ovarian? dx 7, d. 74s-80s  . Rectal cancer Neg Hx   . Stomach cancer Neg Hx     Social History   Socioeconomic History  . Marital status: Married    Spouse name: Not on file  . Number of children: Not on file  . Years of education: Not on file  . Highest education level: Not on file  Occupational History  . Not on file  Tobacco Use  . Smoking status: Never Smoker  . Smokeless tobacco: Never Used  Vaping Use  . Vaping Use: Never used  Substance and  Sexual Activity  . Alcohol use: Not Currently    Comment: not since breast cancer dx  . Drug use: No  . Sexual activity: Not Currently    Partners: Male    Birth control/protection: None  Other Topics Concern  . Not on file  Social History Narrative  . Not on file   Social Determinants of Health   Financial Resource Strain: Low Risk   . Difficulty of Paying Living Expenses: Not hard at all  Food Insecurity: No Food Insecurity  . Worried About Charity fundraiser in the Last Year: Never true  . Ran Out of Food in the Last Year: Never true  Transportation Needs: No Transportation Needs  . Lack of Transportation (Medical): No  . Lack of Transportation (Non-Medical): No  Physical Activity: Not on file  Stress: Not on file  Social Connections: Not on file  Intimate Partner Violence: Not on file    Review of Systems: All systems reviewed and negative except where noted  in HPI.  OBJECTIVE:    Physical Exam: Vital signs in last 24 hours: Temp:  [98 F (36.7 C)] 98 F (36.7 C) (02/14 0431) Pulse Rate:  [92-108] 94 (02/14 0645) Resp:  [14-23] 18 (02/14 0645) BP: (146-158)/(70-100) 158/70 (02/14 0645) SpO2:  [95 %-100 %] 98 % (02/14 0645) Weight:  [99.8 kg] 99.8 kg (02/14 0431)   General:   Alert  female in NAD Psych:  Pleasant, cooperative. Normal mood and affect. Eyes:  Pupils equal, sclera clear, no icterus.   Conjunctiva pink. Ears:  Normal auditory acuity. Nose:  No deformity, discharge,  or lesions. Neck:  Supple; no masses Lungs:  Clear throughout to auscultation.   No wheezes, crackles, or rhonchi.  Heart:  Regular rate and rhythm; no murmurs, no lower extremity edema Abdomen:  Soft, non-distended, nontender, BS active, no palp mass   Rectal:  Deferred  Msk:  Symmetrical without gross deformities. . Neurologic:  Alert and  oriented x4;  grossly normal neurologically. Skin:  Intact without significant lesions or rashes.  Filed Weights   01/08/21 0431  Weight: 99.8 kg     Scheduled inpatient medications . heparin  5,000 Units Subcutaneous Q8H      Intake/Output from previous day: No intake/output data recorded. Intake/Output this shift: No intake/output data recorded.   Lab Results: Recent Labs    01/08/21 0535  WBC 5.8  HGB 10.9*  HCT 33.4*  PLT 330   BMET Recent Labs    01/08/21 0535  NA 136  K 3.8  CL 102  CO2 22  GLUCOSE 191*  BUN 15  CREATININE 0.62  CALCIUM 9.7   LFT Recent Labs    01/08/21 0535  PROT 7.2  ALBUMIN 4.1  AST 104*  ALT 94*  ALKPHOS 74  BILITOT 2.0*   PT/INR No results for input(s): LABPROT, INR in the last 72 hours. Hepatitis Panel No results for input(s): HEPBSAG, HCVAB, HEPAIGM, HEPBIGM in the last 72 hours.   . CBC Latest Ref Rng & Units 01/08/2021 01/01/2021 12/25/2020  WBC 4.0 - 10.5 K/uL 5.8 5.7 5.5  Hemoglobin 12.0 - 15.0 g/dL 10.9(L) 10.7(L) 9.9(L)  Hematocrit 36.0 -  46.0 % 33.4(L) 32.2(L) 29.1(L)  Platelets 150 - 400 K/uL 330 278 324    . CMP Latest Ref Rng & Units 01/08/2021 01/01/2021 12/25/2020  Glucose 70 - 99 mg/dL 191(H) 122(H) 105(H)  BUN 6 - 20 mg/dL 15 13 13   Creatinine 0.44 - 1.00 mg/dL 0.62 0.73 0.64  Sodium 135 - 145 mmol/L 136 137 138  Potassium 3.5 - 5.1 mmol/L 3.8 4.0 3.8  Chloride 98 - 111 mmol/L 102 108 109  CO2 22 - 32 mmol/L 22 23 24   Calcium 8.9 - 10.3 mg/dL 9.7 9.3 9.2  Total Protein 6.5 - 8.1 g/dL 7.2 6.7 6.4(L)  Total Bilirubin 0.3 - 1.2 mg/dL 2.0(H) 0.6 0.6  Alkaline Phos 38 - 126 U/L 74 60 60  AST 15 - 41 U/L 104(H) 29 27  ALT 0 - 44 U/L 94(H) 47(H) 53(H)   Studies/Results: DG Chest Portable 1 View  Result Date: 01/08/2021 CLINICAL DATA:  Chest pain, vomiting, upper back EXAM: PORTABLE CHEST 1 VIEW COMPARISON:  Chest x-ray 09/12/2020. FINDINGS: Right chest wall Port-A-Cath with tip overlying the expected region of the superior cavoatrial junction. The heart size and mediastinal contours are within normal limits. No focal consolidation. No pulmonary edema. No pleural effusion. No pneumothorax. No acute osseous abnormality. Partially visualized foci of gas within the right upper quadrant of unclear etiology. IMPRESSION: 1. No active disease. 2. Partially visualized foci of gas within the right upper quadrant of unclear etiology. Finding likely represents gas within the large bowel. Recommend x-ray abdomen for further evaluation. Electronically Signed   By: Iven Finn M.D.   On: 01/08/2021 05:56   CT Angio Chest/Abd/Pel for Dissection W and/or Wo Contrast  Result Date: 01/08/2021 CLINICAL DATA:  Left-sided breast cancer undergoing chemotherapy. Vomiting and upper back pain EXAM: CT ANGIOGRAPHY CHEST, ABDOMEN AND PELVIS TECHNIQUE: Non-contrast CT of the chest was initially obtained. Multidetector CT imaging through the chest, abdomen and pelvis was performed using the standard protocol during bolus administration of intravenous  contrast. Multiplanar reconstructed images and MIPs were obtained and reviewed to evaluate the vascular anatomy. CONTRAST:  163m OMNIPAQUE IOHEXOL 350 MG/ML SOLN COMPARISON:  None. FINDINGS: CTA CHEST FINDINGS Cardiovascular: Normal heart size. No pericardial effusion. No aortic dissection, aneurysm, or intramural hematoma. Porta catheter on the right with tip in good position at the upper cavoatrial junction. Mediastinum/Nodes: Negative for adenopathy or mass. Postoperative left breast. Lungs/Pleura: The central airways are clear. There is no edema, consolidation, effusion, or pneumothorax. Musculoskeletal: Patchy areas of thoracic spine sclerosis which are adjacent endplates and appear reactive. No acute finding. Review of the MIP images confirms the above findings. CTA ABDOMEN AND PELVIS FINDINGS VASCULAR Aorta: Normal caliber aorta without aneurysm, dissection, vasculitis or significant stenosis. Celiac: Patent without evidence of aneurysm, dissection, vasculitis or significant stenosis. SMA: Patent without evidence of aneurysm, dissection, vasculitis or significant stenosis. Renals: Unremarkable IMA: Patent Inflow: Unremarkable Veins: Unremarkable in the venous phase Review of the MIP images confirms the above findings. NON-VASCULAR Hepatobiliary: No focal liver abnormality.Distended gallbladder and bile ducts. The common bile duct measures up to 1 cm in diameter. No visible choledocholithiasis. There is a calcified gallstone. Pancreas: Mild stranding around the pancreas, especially at the body and tail. No fluid collection or detectable necrosis. Spleen: Unremarkable. Adrenals/Urinary Tract: Negative adrenals. No hydronephrosis or stone. Unremarkable bladder. Stomach/Bowel:  No obstruction. No appendicitis. Lymphatic: No mass or adenopathy. Reproductive:2.4 cm subserosal fibroid. Other: No ascites or pneumoperitoneum. Musculoskeletal: No acute abnormalities. Review of the MIP images confirms the above  findings. IMPRESSION: Suspect mild acute edematous pancreatitis. There is cholelithiasis with biliary and gallbladder distension but no detected choledocholithiasis, consider MRCP. Normal CTA of the aorta. Electronically Signed   By: JMonte FantasiaM.D.   On: 01/08/2021 07:28   [place]domen Limited RUQ (LIVER/GB)  Result Date: 01/08/2021 CLINICAL  DATA:  Right upper quadrant pain X 1 day. History of breast cancer. EXAM: ULTRASOUND ABDOMEN LIMITED RIGHT UPPER QUADRANT COMPARISON:  None. FINDINGS: Gallbladder: 8 mm calcified gallstone noted within the gallbladder lumen. No pericholecystic fluid or wall thickening visualized. No sonographic Murphy sign noted by sonographer. Intrahepatic biliary ducts are measuring at the upper limits of normal. Common bile duct: Diameter: Dilated measuring up to 10 mm. Liver: No focal lesion identified. Within normal limits in parenchymal echogenicity. Portal vein is patent on color Doppler imaging with normal direction of blood flow towards the liver. Other: None. IMPRESSION: 1. Cholelithiasis with no findings of acute cholecystitis. 2. Enlarged common bile duct concerning for choledocholithiasis. Correlate with liver function tests, and if clinically indicated consider MRCP for further evaluation. Electronically Signed   By: Iven Finn M.D.   On: 01/08/2021 05:59    Principal Problem:   RUQ pain Active Problems:   Malignant neoplasm of lower-outer quadrant of left breast of female, estrogen receptor positive ([place])   Hypertensive urgency   Elevated troponin    Tye Savoy, NP-C @  01/08/2021, 9:25 AM

## 2021-01-08 NOTE — Consult Note (Addendum)
[facility] Surgery Consult  Note  KOOPER CHRISWELL 1966-03-15  [id number].    Requesting MD: Dr. Billie Lade Chief Complaint: Nausea/vomiting/upper back pain Reason for Consult: Cholelithiasis/cholecystitis  HPI:  Patient is a 55 year old female with left breast cancer underwent left breast lumpectomy and axillary lymph node dissection 08/16/2020 by Dr. [name].  She is undergoing with chemotherapy with Taxol, Adriamycin, and Cytoxan.  She presented with the sudden onset of right sided mid back pain and nausea with recurrent vomiting.  In the ED she rated her pain as a 7 out of 10.  She reports having spaghetti and meatballs for supper.  She treated her symptoms with Zofran without relief.  She ultimately presented to the ED for further evaluation and treatment.  She has had about 1 episode of this per week for the last 3 weeks and had been attributing it to the chemotherapy.  Always in the PM after meals.   Work-up shows she is afebrile, she was tachycardic with heart rate around 104 on admission.  Blood pressure 151/99.  Sats are good on 2 L nasal cannula.  Labs obtained this a.m. shows her glucose 191, AST 104, ALT 94, total bilirubin 2.0, lipase 25, WBC 5.8, H/H 10.9/33.4, platelets 330,000.  Troponins 19/33.  EKG shows sinus tachycardia with rate of 100.  Respiratory panel is negative.  Abdominal ultrasound shows an 8 mm calcified gallstone within the gallbladder, no pericholecystic fluid or wall thickening, no Murphy sign.  Intrahepatic biliary ducts measured the upper limits of normal.  CBD was 10 mm.  A CT angio of the chest abdomen pelvis for dissection showed some mild acute edematous pancreatitis, cholelithiasis with biliary and gallbladder distention.  MRCP shows cholelithiasis with moderate intra and extrahepatic biliary duct dilatation without obstructive stone or mass.  Was their opinion that is favored a recent stone passage.  Noncomplicated mild pancreatitis there is also a tiny  hiatal hernia and a trace of perihepatic ascites.  We are asked to see and evaluate for cholecystectomy.  ROS: Review of Systems  Constitutional: Negative.   HENT: Negative.   Eyes: Negative.   Respiratory:       She has had COVID vaccines and boosters  Cardiovascular: Negative.   Gastrointestinal: Positive for abdominal pain, heartburn, nausea and vomiting. Negative for blood in stool, constipation, diarrhea and melena.       She has some occasional issues with hemorrhoids  Genitourinary: Negative.   Musculoskeletal: Negative.   Skin: Negative.   Neurological: Negative.   Endo/Heme/Allergies: Negative.   Psychiatric/Behavioral: The patient is nervous/anxious.     Family History  Problem Relation Age of Onset  . Breast cancer Mother        dx 26  . Prostate cancer Father        dx late 50s/early 82s  . Colon cancer Maternal Grandmother        early 33s  . Breast cancer Cousin        dx 28  . Esophageal cancer Maternal Grandfather        dx 83s  . Cancer Maternal Aunt        maternal grandmother's sister; possible ovarian? dx 82, d. 31s-80s  . Rectal cancer Neg Hx   . Stomach cancer Neg Hx     Past Medical History:  Diagnosis Date  . Allergy    seasonal  . Anemia    After childbirth  . Anxiety   . Breast cancer ([place])   . Family history of  breast cancer 07/26/2020  . Family history of colon cancer 07/26/2020  . Family history of prostate cancer 07/26/2020  . GERD (gastroesophageal reflux disease)   . Hypertensive urgency 01/08/2021  . Pneumonia    In 20s    Past Surgical History:  Procedure Laterality Date  . AXILLARY LYMPH NODE DISSECTION Left 08/16/2020   Procedure: LEFT TARGETED AXILLARY LYMPH NODE DISSECTION;  Surgeon: [name], MD;  Location: [facility];  Service: General;  Laterality: Left;  . BREAST LUMPECTOMY WITH RADIOACTIVE SEED AND AXILLARY LYMPH NODE DISSECTION Left 08/16/2020   Procedure: LEFT BREAST LUMPECTOMY WITH RADIOACTIVE SEED;  Surgeon: [name], MD;  Location: [facility];  Service: General;  Laterality: Left;  PEC BLOCK  . BREAST SURGERY Left 06/2020   breast bx   . CERVICAL BIOPSY  W/ LOOP ELECTRODE EXCISION  12/2017  . CESAREAN SECTION    . DILATION AND CURETTAGE OF UTERUS    . PORTACATH PLACEMENT  09/12/2020   Procedure: INSERTION PORT-A-CATH WITH ULTRASOUND;  Surgeon: [name], MD;  Location: [facility];  Service: General;;  . WISDOM TOOTH EXTRACTION  2020    Social History:  reports that she has never smoked. She has never used smokeless tobacco. She reports previous alcohol use. She reports that she does not use drugs.  Allergies: No Known Allergies  Medications Prior to Admission  Medication Sig Dispense Refill  . ALPRAZolam (XANAX) 0.25 MG tablet Take 1 tablet (0.25 mg total) by mouth 2 (two) times daily as needed for anxiety. 60 tablet 3  . dexamethasone (DECADRON) 4 MG tablet Take 1 tablet (4 mg total) by mouth as needed (daily as needed for nausea). 30 tablet 0  . lidocaine-prilocaine (EMLA) cream Apply to affected area once (Patient taking differently: Apply 1 application topically as needed (port access).) 30 g 3  . ondansetron (ZOFRAN-ODT) 8 MG disintegrating tablet TAKE 1 TABLET BY MOUTH EVERY 8 HOURS AS NEEDED FOR NAUSEA OR VOMITING. (Patient taking differently: Take 8 mg by mouth every 8 (eight) hours as needed for nausea or vomiting.) 20 tablet 1  . prochlorperazine (COMPAZINE) 10 MG tablet Take 1 tablet (10 mg total) by mouth every 6 (six) hours as needed (Nausea or vomiting). 30 tablet 1  . sodium chloride (OCEAN) 0.65 % SOLN nasal spray Place 1 spray into both nostrils as needed for congestion.      Blood pressure (!) 160/97, pulse 93, temperature 98.7 F (37.1 C), temperature source Oral, resp. rate 18, height 5\' 6"  (1.676 m), weight 99.8 kg, SpO2 99 %. Physical Exam:  General: pleasant, WD, WN white female who is pacing in her room, but  in NAD HEENT: head is normocephalic, atraumatic.  Sclera are  noninjected.  Pupils are equal.  Ears and nose without any masses or lesions.  Mouth is pink and moist Heart: regular, rate, and rhythm.  Normal s1,s2. No obvious murmurs, gallops, or rubs noted.  Palpable radial and pedal pulses bilaterally Lungs: CTAB, no wheezes, rhonchi, or rales noted.  Respiratory effort nonlabored.  Port right chest Abd: soft, tender to palpation RUQ, ND, +BS, no masses, hernias, or organomegaly MS: all 4 extremities are symmetrical with no cyanosis, clubbing, or edema. Skin: warm and dry with no masses, lesions, or rashes Neuro: Cranial nerves 2-12 grossly intact, sensation is normal throughout Psych: A&Ox3 with an appropriate affect.   Results for orders placed or performed during the hospital encounter of 01/08/21 (from the past 48 hour(s))  CBC with Differential  Status: Abnormal   Collection Time: 01/08/21  5:35 AM  Result Value Ref Range   WBC 5.8 4.0 - 10.5 K/uL   RBC 3.32 (L) 3.87 - 5.11 MIL/uL   Hemoglobin 10.9 (L) 12.0 - 15.0 g/dL   HCT 33.4 (L) 36.0 - 46.0 %   MCV 100.6 (H) 80.0 - 100.0 fL   MCH 32.8 26.0 - 34.0 pg   MCHC 32.6 30.0 - 36.0 g/dL   RDW 14.6 11.5 - 15.5 %   Platelets 330 150 - 400 K/uL   nRBC 0.3 (H) 0.0 - 0.2 %   Neutrophils Relative % 81 %   Neutro Abs 4.7 1.7 - 7.7 K/uL   Lymphocytes Relative 12 %   Lymphs Abs 0.7 0.7 - 4.0 K/uL   Monocytes Relative 5 %   Monocytes Absolute 0.3 0.1 - 1.0 K/uL   Eosinophils Relative 0 %   Eosinophils Absolute 0.0 0.0 - 0.5 K/uL   Basophils Relative 1 %   Basophils Absolute 0.1 0.0 - 0.1 K/uL   Immature Granulocytes 1 %   Abs Immature Granulocytes 0.06 0.00 - 0.07 K/uL    Comment: Performed at [facility], [place] [address] [place], [place] [address]  Comprehensive metabolic panel     Status: Abnormal   Collection Time: 01/08/21  5:35 AM  Result Value Ref Range   Sodium 136 135 - 145 mmol/L   Potassium 3.8 3.5 - 5.1 mmol/L   Chloride 102 98 - 111 mmol/L   CO2 22 22 - 32  mmol/L   Glucose, Bld 191 (H) 70 - 99 mg/dL    Comment: Glucose reference range applies only to samples taken after fasting for at least 8 hours.   BUN 15 6 - 20 mg/dL   Creatinine, Ser 0.62 0.44 - 1.00 mg/dL   Calcium 9.7 8.9 - 10.3 mg/dL   Total Protein 7.2 6.5 - 8.1 g/dL   Albumin 4.1 3.5 - 5.0 g/dL   AST 104 (H) 15 - 41 U/L   ALT 94 (H) 0 - 44 U/L   Alkaline Phosphatase 74 38 - 126 U/L   Total Bilirubin 2.0 (H) 0.3 - 1.2 mg/dL   GFR, Estimated >60 >60 mL/min    Comment: (NOTE) Calculated using the CKD-EPI Creatinine Equation (2021)    Anion gap 12 5 - 15    Comment: Performed at [facility], [place] [address] [place], [place] [address]  Lipase, blood     Status: None   Collection Time: 01/08/21  5:35 AM  Result Value Ref Range   Lipase 25 11 - 51 U/L    Comment: Performed at [facility], [place] [address] [place], [place] [address]  Troponin I (High Sensitivity)     Status: Abnormal   Collection Time: 01/08/21  5:35 AM  Result Value Ref Range   Troponin I (High Sensitivity) 19 (H) <18 ng/L    Comment: (NOTE) Elevated high sensitivity troponin I (hsTnI) values and significant  changes across serial measurements may suggest ACS but many other  chronic and acute conditions are known to elevate hsTnI results.  Refer to the "Links" section for chest pain algorithms and additional  guidance. Performed at [facility], [place] [address] [place], [place] [address]   Resp Panel by RT-PCR (Flu A&B, Covid) Nasopharyngeal Swab     Status: None   Collection Time: 01/08/21  6:41 AM   Specimen: Nasopharyngeal Swab; Nasopharyngeal(NP) swabs in vial transport medium  Result  Value Ref Range   SARS Coronavirus 2 by RT PCR NEGATIVE NEGATIVE    Comment: (NOTE) SARS-CoV-2 target nucleic acids are NOT DETECTED.  The SARS-CoV-2 RNA is generally detectable in upper respiratory specimens during the acute phase of infection. The  lowest concentration of SARS-CoV-2 viral copies this assay can detect is 138 copies/mL. A negative result does not preclude SARS-Cov-2 infection and should not be used as the sole basis for treatment or other patient management decisions. A negative result may occur with  improper specimen collection/handling, submission of specimen other than nasopharyngeal swab, presence of viral mutation(s) within the areas targeted by this assay, and inadequate number of viral copies(<138 copies/mL). A negative result must be combined with clinical observations, patient history, and epidemiological information. The expected result is Negative.  Fact Sheet for Patients:  EntrepreneurPulse.com.au  Fact Sheet for Healthcare Providers:  IncredibleEmployment.be  This test is no t yet approved or cleared by the [place] FDA and  has been authorized for detection and/or diagnosis of SARS-CoV-2 by FDA under an Emergency Use Authorization (EUA). This EUA will remain  in effect (meaning this test can be used) for the duration of the COVID-19 declaration under Section 564(b)(1) of the Act, 21 U.S.C.section 360bbb-3(b)(1), unless the authorization is terminated  or revoked sooner.       Influenza A by PCR NEGATIVE NEGATIVE   Influenza B by PCR NEGATIVE NEGATIVE    Comment: (NOTE) The Xpert Xpress SARS-CoV-2/FLU/RSV plus assay is intended as an aid in the diagnosis of influenza from Nasopharyngeal swab specimens and should not be used as a sole basis for treatment. Nasal washings and aspirates are unacceptable for Xpert Xpress SARS-CoV-2/FLU/RSV testing.  Fact Sheet for Patients: EntrepreneurPulse.com.au  Fact Sheet for Healthcare Providers: IncredibleEmployment.be  This test is not yet approved or cleared by the [place] FDA and has been authorized for detection and/or diagnosis of SARS-CoV-2 by FDA under an Emergency  Use Authorization (EUA). This EUA will remain in effect (meaning this test can be used) for the duration of the COVID-19 declaration under Section 564(b)(1) of the Act, 21 U.S.C. section 360bbb-3(b)(1), unless the authorization is terminated or revoked.  Performed at [facility], [place] [address] [place], [place] [address]   Troponin I (High Sensitivity)     Status: Abnormal   Collection Time: 01/08/21  7:46 AM  Result Value Ref Range   Troponin I (High Sensitivity) 28 (H) <18 ng/L    Comment: (NOTE) Elevated high sensitivity troponin I (hsTnI) values and significant  changes across serial measurements may suggest ACS but many other  chronic and acute conditions are known to elevate hsTnI results.  Refer to the "Links" section for chest pain algorithms and additional  guidance. Performed at [facility], [place] [address], [place], [place] [address]   HIV Antibody (routine testing w rflx)     Status: None   Collection Time: 01/08/21 10:55 AM  Result Value Ref Range   HIV Screen 4th Generation wRfx Non Reactive Non Reactive    Comment: Performed at [facility], [place] [address], [place], [place] [address]  Troponin I (High Sensitivity)     Status: Abnormal   Collection Time: 01/08/21 10:55 AM  Result Value Ref Range   Troponin I (High Sensitivity) 24 (H) <18 ng/L    Comment: (NOTE) Elevated high sensitivity troponin I (hsTnI) values and significant  changes across serial measurements may suggest ACS but many other  chronic and acute conditions  are known to elevate hsTnI results.  Refer to the "Links" section for chest pain algorithms and additional  guidance. Performed at [facility], [place] [address], [place], [place] [address]   Troponin I (High Sensitivity)     Status: Abnormal   Collection Time: 01/08/21  1:14 PM  Result Value Ref Range   Troponin I (High Sensitivity) 33 (H) <18 ng/L    Comment: (NOTE) Elevated  high sensitivity troponin I (hsTnI) values and significant  changes across serial measurements may suggest ACS but many other  chronic and acute conditions are known to elevate hsTnI results.  Refer to the "Links" section for chest pain algorithms and additional  guidance. Performed at [facility], [place] [address] [place], [place] [address]    MR 3D Recon At Scanner  Result Date: 01/08/2021 CLINICAL DATA:  Right upper quadrant pain. Jaundice. Breast cancer. Evaluate possible pancreatitis. EXAM: MRI ABDOMEN WITHOUT AND WITH CONTRAST (INCLUDING MRCP) TECHNIQUE: Multiplanar multisequence MR imaging of the abdomen was performed both before and after the administration of intravenous contrast. Heavily T2-weighted images of the biliary and pancreatic ducts were obtained, and three-dimensional MRCP images were rendered by post processing. CONTRAST:  70mL GADAVIST GADOBUTROL 1 MMOL/ML IV SOLN COMPARISON:  9 cc Gadavist abdominal ultrasound of earlier today. CTA of the chest, abdomen, and pelvis of earlier today. FINDINGS: Lower chest: Normal heart size without pericardial or pleural effusion. Hepatobiliary: No focal liver lesion. Moderate intrahepatic biliary duct dilatation. Multiple tiny gallstones without specific evidence of acute cholecystitis. Moderate common duct dilatation, including at 1.0 cm on 15/12. The common duct tapers gradually, without evidence of obstructive stone or mass. Pancreas: Mild peripancreatic edema, including on 21/4. No evidence of pancreatic necrosis, duct dilatation, or well-circumscribed peripancreatic fluid collection. Spleen:  Normal in size, without focal abnormality. Adrenals/Urinary Tract: Normal adrenal glands. Normal kidneys, without hydronephrosis. Stomach/Bowel: Tiny hiatal hernia.  Normal abdominal bowel loops. Vascular/Lymphatic: Aortic atherosclerosis. Retroaortic left renal vein. No retroperitoneal or retrocrural adenopathy. Other:  Trace  perihepatic ascites. Musculoskeletal: No acute osseous abnormality. Minimal convex left lumbar spine curvature. IMPRESSION: 1. Cholelithiasis. Moderate intra and extrahepatic biliary duct dilatation, without obstructive stone or mass. Favor recent stone passage. Consider surveillance of bilirubin. If persistently elevated, ERCP should be considered to exclude less likely occult ampullary stenosis. 2. Non complicated mild pancreatitis. 3.  Aortic Atherosclerosis (ICD10-I70.0). 4.  Tiny hiatal hernia. 5. Trace perihepatic ascites. Electronically Signed   By: Abigail Miyamoto M.D.   On: 01/08/2021 12:59   DG Chest Portable 1 View  Result Date: 01/08/2021 CLINICAL DATA:  Chest pain, vomiting, upper back EXAM: PORTABLE CHEST 1 VIEW COMPARISON:  Chest x-ray 09/12/2020. FINDINGS: Right chest wall Port-A-Cath with tip overlying the expected region of the superior cavoatrial junction. The heart size and mediastinal contours are within normal limits. No focal consolidation. No pulmonary edema. No pleural effusion. No pneumothorax. No acute osseous abnormality. Partially visualized foci of gas within the right upper quadrant of unclear etiology. IMPRESSION: 1. No active disease. 2. Partially visualized foci of gas within the right upper quadrant of unclear etiology. Finding likely represents gas within the large bowel. Recommend x-ray abdomen for further evaluation. Electronically Signed   By: Iven Finn M.D.   On: 01/08/2021 05:56   MR ABDOMEN MRCP W WO CONTAST  Result Date: 01/08/2021 CLINICAL DATA:  Right upper quadrant pain. Jaundice. Breast cancer. Evaluate possible pancreatitis. EXAM: MRI ABDOMEN WITHOUT AND WITH CONTRAST (INCLUDING MRCP) TECHNIQUE: Multiplanar multisequence MR imaging of  the abdomen was performed both before and after the administration of intravenous contrast. Heavily T2-weighted images of the biliary and pancreatic ducts were obtained, and three-dimensional MRCP images were rendered by post  processing. CONTRAST:  50mL GADAVIST GADOBUTROL 1 MMOL/ML IV SOLN COMPARISON:  9 cc Gadavist abdominal ultrasound of earlier today. CTA of the chest, abdomen, and pelvis of earlier today. FINDINGS: Lower chest: Normal heart size without pericardial or pleural effusion. Hepatobiliary: No focal liver lesion. Moderate intrahepatic biliary duct dilatation. Multiple tiny gallstones without specific evidence of acute cholecystitis. Moderate common duct dilatation, including at 1.0 cm on 15/12. The common duct tapers gradually, without evidence of obstructive stone or mass. Pancreas: Mild peripancreatic edema, including on 21/4. No evidence of pancreatic necrosis, duct dilatation, or well-circumscribed peripancreatic fluid collection. Spleen:  Normal in size, without focal abnormality. Adrenals/Urinary Tract: Normal adrenal glands. Normal kidneys, without hydronephrosis. Stomach/Bowel: Tiny hiatal hernia.  Normal abdominal bowel loops. Vascular/Lymphatic: Aortic atherosclerosis. Retroaortic left renal vein. No retroperitoneal or retrocrural adenopathy. Other:  Trace perihepatic ascites. Musculoskeletal: No acute osseous abnormality. Minimal convex left lumbar spine curvature. IMPRESSION: 1. Cholelithiasis. Moderate intra and extrahepatic biliary duct dilatation, without obstructive stone or mass. Favor recent stone passage. Consider surveillance of bilirubin. If persistently elevated, ERCP should be considered to exclude less likely occult ampullary stenosis. 2. Non complicated mild pancreatitis. 3.  Aortic Atherosclerosis (ICD10-I70.0). 4.  Tiny hiatal hernia. 5. Trace perihepatic ascites. Electronically Signed   By: Abigail Miyamoto M.D.   On: 01/08/2021 12:59   CT Angio Chest/Abd/Pel for Dissection W and/or Wo Contrast  Result Date: 01/08/2021 CLINICAL DATA:  Left-sided breast cancer undergoing chemotherapy. Vomiting and upper back pain EXAM: CT ANGIOGRAPHY CHEST, ABDOMEN AND PELVIS TECHNIQUE: Non-contrast CT of the  chest was initially obtained. Multidetector CT imaging through the chest, abdomen and pelvis was performed using the standard protocol during bolus administration of intravenous contrast. Multiplanar reconstructed images and MIPs were obtained and reviewed to evaluate the vascular anatomy. CONTRAST:  168mL OMNIPAQUE IOHEXOL 350 MG/ML SOLN COMPARISON:  None. FINDINGS: CTA CHEST FINDINGS Cardiovascular: Normal heart size. No pericardial effusion. No aortic dissection, aneurysm, or intramural hematoma. Porta catheter on the right with tip in good position at the upper cavoatrial junction. Mediastinum/Nodes: Negative for adenopathy or mass. Postoperative left breast. Lungs/Pleura: The central airways are clear. There is no edema, consolidation, effusion, or pneumothorax. Musculoskeletal: Patchy areas of thoracic spine sclerosis which are adjacent endplates and appear reactive. No acute finding. Review of the MIP images confirms the above findings. CTA ABDOMEN AND PELVIS FINDINGS VASCULAR Aorta: Normal caliber aorta without aneurysm, dissection, vasculitis or significant stenosis. Celiac: Patent without evidence of aneurysm, dissection, vasculitis or significant stenosis. SMA: Patent without evidence of aneurysm, dissection, vasculitis or significant stenosis. Renals: Unremarkable IMA: Patent Inflow: Unremarkable Veins: Unremarkable in the venous phase Review of the MIP images confirms the above findings. NON-VASCULAR Hepatobiliary: No focal liver abnormality.Distended gallbladder and bile ducts. The common bile duct measures up to 1 cm in diameter. No visible choledocholithiasis. There is a calcified gallstone. Pancreas: Mild stranding around the pancreas, especially at the body and tail. No fluid collection or detectable necrosis. Spleen: Unremarkable. Adrenals/Urinary Tract: Negative adrenals. No hydronephrosis or stone. Unremarkable bladder. Stomach/Bowel:  No obstruction. No appendicitis. Lymphatic: No mass or  adenopathy. Reproductive:2.4 cm subserosal fibroid. Other: No ascites or pneumoperitoneum. Musculoskeletal: No acute abnormalities. Review of the MIP images confirms the above findings. IMPRESSION: Suspect mild acute edematous pancreatitis. There is cholelithiasis with biliary and gallbladder distension but no  detected choledocholithiasis, consider MRCP. Normal CTA of the aorta. Electronically Signed   By: Monte Fantasia M.D.   On: 01/08/2021 07:28   US Abdomen Limited RUQ (LIVER/GB)  Result Date: 01/08/2021 CLINICAL DATA:  Right upper quadrant pain X 1 day. History of breast cancer. EXAM: ULTRASOUND ABDOMEN LIMITED RIGHT UPPER QUADRANT COMPARISON:  None. FINDINGS: Gallbladder: 8 mm calcified gallstone noted within the gallbladder lumen. No pericholecystic fluid or wall thickening visualized. No sonographic Murphy sign noted by sonographer. Intrahepatic biliary ducts are measuring at the upper limits of normal. Common bile duct: Diameter: Dilated measuring up to 10 mm. Liver: No focal lesion identified. Within normal limits in parenchymal echogenicity. Portal vein is patent on color Doppler imaging with normal direction of blood flow towards the liver. Other: None. IMPRESSION: 1. Cholelithiasis with no findings of acute cholecystitis. 2. Enlarged common bile duct concerning for choledocholithiasis. Correlate with liver function tests, and if clinically indicated consider MRCP for further evaluation. Electronically Signed   By: Iven Finn M.D.   On: 01/08/2021 05:59      Assessment/Plan Left breast cancer S/p lumpectomy/chemotherapy Adriamycin/Cytoxan  Abdominal pain/cholelithiasis/CBD 10 mm MRCP 2/14: Multiple tiny gallstones without evidence of cholecystitis/moderate common bile duct dilatation of 10 mm, no evidence of obstructive stone or mass.  Mild peripancreatic edema no evidence of necrosis ductal dilatation or well-circumscribed peripancreatic fluid collection.  FEN:  Clears tonight/NPO  after MN/IV fluids ID: [id number] 2/14 >> DVT: heparin   Plan:  Recheck labs in AM, await results of the ECHO, and tentatively plan laparoscopic cholecystectomy with IOC tomorrow. Risk and benefits discussed in detail.    Earnstine Regal [facility] Surgery 01/08/2021, 3:47 PM Please see Amion for pager number during day hours 7:00am-4:30pm

## 2021-01-08 NOTE — H&P (Addendum)
History and Physical        Hospital Admission Note Date: 01/08/2021  Patient name: Lindsay Hampton Medical record number: [id number] Date of birth: Nov 04, 1966 Age: 55 y.o. Gender: female  PCP: [name], FNP  Chief Complaint    Chief Complaint  Patient presents with  . Vomiting      HPI:   This is a 55 year old female with past medical history of left-sided breast cancer s/p lumpectomy and undergoing adjuvant chemotherapy with Taxol, anxiety, anemia presented to the ED with nausea and nonbloody, nonbilious vomiting that began last night after dinner.  With associated right-sided mid back pain with radiation to RUQ. Had spaghetti and meatballs for dinner.  Attempted to treat symptoms with home Zofran around 2100 without improvement. No known history of gallstones. Denies any fever, chills, night sweats, diarrhea or any other complaints.  Currently feeling improved after receiving pain meds in the ED.  Screening Colonoscopy with Dr. [name] 02/24/2018: 4 polyps removed (no high grade dysplasia or malignancy), diverticulosis, internal hemorrhoids. Recommended follow up colonoscopy in 3-5 years.   ED Course: Afebrile, tachycardic, hypertensive, on room air. Notable Labs: Sodium 136, K3.8, glucose 191, BUN 15, creatinine 0.62, lipase 25, alk phos 74, AST 104, ALT 94, T bili 2.0, WBC 5.8, Hb 10.9, platelets 330, troponin 19, COVID-19 and flu negative. Notable Imaging: CXR partially visualized foci of gas in the RUQ.  RUQ [place] cholelithiasis without acute cholecystitis, enlarged CBD concerning for choledocholithiasis.  CTA chest abdomen pelvis suspicious for pancreatitis, cholelithiasis with biliary and gallbladder distention but no detected choledocholithiasis.  [facility] was consulted, patient started on Zosyn.    Vitals:   01/08/21 0615 01/08/21 0645  BP: (!) 154/72 (!) 158/70   Pulse: 92 94  Resp: 18 18  Temp:    SpO2: 95% 98%     Review of Systems:  Review of Systems  All other systems reviewed and are negative.   Medical/Social/Family History   Past Medical History: Past Medical History:  Diagnosis Date  . Allergy    seasonal  . Anemia    After childbirth  . Anxiety   . Breast cancer ([place])   . Family history of breast cancer 07/26/2020  . Family history of colon cancer 07/26/2020  . Family history of prostate cancer 07/26/2020  . GERD (gastroesophageal reflux disease)   . Hypertensive urgency 01/08/2021  . Pneumonia    In 20s    Past Surgical History:  Procedure Laterality Date  . AXILLARY LYMPH NODE DISSECTION Left 08/16/2020   Procedure: LEFT TARGETED AXILLARY LYMPH NODE DISSECTION;  Surgeon: [name], MD;  Location: [place] [facility];  Service: General;  Laterality: Left;  . BREAST LUMPECTOMY WITH RADIOACTIVE SEED AND AXILLARY LYMPH NODE DISSECTION Left 08/16/2020   Procedure: LEFT BREAST LUMPECTOMY WITH RADIOACTIVE SEED;  Surgeon: [name], MD;  Location: [place];  Service: General;  Laterality: Left;  PEC BLOCK  . BREAST SURGERY Left 06/2020   breast bx   . CERVICAL BIOPSY  W/ LOOP ELECTRODE EXCISION  12/2017  . CESAREAN SECTION    . DILATION AND CURETTAGE OF UTERUS    . PORTACATH PLACEMENT  09/12/2020   Procedure: INSERTION PORT-A-CATH WITH ULTRASOUND;  Surgeon: [name],  Shanon Brow, MD;  Location: [facility];  Service: General;;  . WISDOM TOOTH EXTRACTION  2020    Medications: Prior to Admission medications   Medication Sig Start Date End Date Taking? Authorizing Provider  ALPRAZolam (XANAX) 0.25 MG tablet Take 1 tablet (0.25 mg total) by mouth 2 (two) times daily as needed for anxiety. 09/04/20  Yes [name], MD  dexamethasone (DECADRON) 4 MG tablet Take 1 tablet (4 mg total) by mouth as needed (daily as needed for nausea). 12/13/20  Yes [name], MD  lidocaine-prilocaine (EMLA) cream Apply to affected area once Patient taking differently:  Apply 1 application topically as needed (port access). 09/04/20  Yes [name], MD  ondansetron (ZOFRAN-ODT) 8 MG disintegrating tablet TAKE 1 TABLET BY MOUTH EVERY 8 HOURS AS NEEDED FOR NAUSEA OR VOMITING. Patient taking differently: Take 8 mg by mouth every 8 (eight) hours as needed for nausea or vomiting. 01/02/21  Yes [name], MD  prochlorperazine (COMPAZINE) 10 MG tablet Take 1 tablet (10 mg total) by mouth every 6 (six) hours as needed (Nausea or vomiting). 11/28/20  Yes [name], MD  sodium chloride (OCEAN) 0.65 % SOLN nasal spray Place 1 spray into both nostrils as needed for congestion.   Yes [provider]    Allergies:  No Known Allergies  Social History:  reports that she has never smoked. She has never used smokeless tobacco. She reports previous alcohol use. She reports that she does not use drugs.  Family History: Family History  Problem Relation Age of Onset  . Breast cancer Mother        dx 52  . Prostate cancer Father        dx late 50s/early 10s  . Colon cancer Maternal Grandmother        early 42s  . Breast cancer Cousin        dx 67  . Esophageal cancer Maternal Grandfather        dx 58s  . Cancer Maternal Aunt        maternal grandmother's sister; possible ovarian? dx 58, d. 75s-80s  . Rectal cancer Neg Hx   . Stomach cancer Neg Hx      Objective   Physical Exam: Blood pressure (!) 158/70, pulse 94, temperature 98 F (36.7 C), resp. rate 18, height 5' 6"  (1.676 m), weight 99.8 kg, SpO2 98 %.  Physical Exam Vitals and nursing note reviewed.  Constitutional:      Appearance: Normal appearance.  HENT:     Head: Normocephalic and atraumatic.  Eyes:     Conjunctiva/sclera: Conjunctivae normal.  Cardiovascular:     Rate and Rhythm: Normal rate and regular rhythm.  Pulmonary:     Effort: Pulmonary effort is normal.     Breath sounds: Normal breath sounds.  Abdominal:     General: Abdomen is flat. There is no distension.      Palpations: Abdomen is soft.     Tenderness: There is no abdominal tenderness.  Musculoskeletal:        General: No swelling or tenderness.     Comments: Right upper chest port  Skin:    Coloration: Skin is not jaundiced or pale.  Neurological:     Mental Status: She is alert. Mental status is at baseline.  Psychiatric:        Mood and Affect: Mood normal.        Behavior: Behavior normal.     LABS on Admission: I have personally reviewed all the labs  and imaging below    Basic Metabolic Panel: Recent Labs  Lab 01/01/21 0910 01/08/21 0535  NA 137 136  K 4.0 3.8  CL 108 102  CO2 23 22  GLUCOSE 122* 191*  BUN 13 15  CREATININE 0.73 0.62  CALCIUM 9.3 9.7   Liver Function Tests: Recent Labs  Lab 01/01/21 0910 01/08/21 0535  AST 29 104*  ALT 47* 94*  ALKPHOS 60 74  BILITOT 0.6 2.0*  PROT 6.7 7.2  ALBUMIN 3.6 4.1   Recent Labs  Lab 01/08/21 0535  LIPASE 25   No results for input(s): AMMONIA in the last 168 hours. CBC: Recent Labs  Lab 01/01/21 0910 01/08/21 0535  WBC 5.7 5.8  NEUTROABS 4.2 4.7  HGB 10.7* 10.9*  HCT 32.2* 33.4*  MCV 99.4 100.6*  PLT 278 330   Cardiac Enzymes: No results for input(s): CKTOTAL, CKMB, CKMBINDEX, TROPONINI in the last 168 hours. BNP: Invalid input(s): POCBNP CBG: No results for input(s): GLUCAP in the last 168 hours.  Radiological Exams on Admission:  DG Chest Portable 1 View  Result Date: 01/08/2021 CLINICAL DATA:  Chest pain, vomiting, upper back EXAM: PORTABLE CHEST 1 VIEW COMPARISON:  Chest x-ray 09/12/2020. FINDINGS: Right chest wall Port-A-Cath with tip overlying the expected region of the superior cavoatrial junction. The heart size and mediastinal contours are within normal limits. No focal consolidation. No pulmonary edema. No pleural effusion. No pneumothorax. No acute osseous abnormality. Partially visualized foci of gas within the right upper quadrant of unclear etiology. IMPRESSION: 1. No active disease. 2.  Partially visualized foci of gas within the right upper quadrant of unclear etiology. Finding likely represents gas within the large bowel. Recommend x-ray abdomen for further evaluation. Electronically Signed   By: Iven Finn M.D.   On: 01/08/2021 05:56   CT Angio Chest/Abd/Pel for Dissection W and/or Wo Contrast  Result Date: 01/08/2021 CLINICAL DATA:  Left-sided breast cancer undergoing chemotherapy. Vomiting and upper back pain EXAM: CT ANGIOGRAPHY CHEST, ABDOMEN AND PELVIS TECHNIQUE: Non-contrast CT of the chest was initially obtained. Multidetector CT imaging through the chest, abdomen and pelvis was performed using the standard protocol during bolus administration of intravenous contrast. Multiplanar reconstructed images and MIPs were obtained and reviewed to evaluate the vascular anatomy. CONTRAST:  178m OMNIPAQUE IOHEXOL 350 MG/ML SOLN COMPARISON:  None. FINDINGS: CTA CHEST FINDINGS Cardiovascular: Normal heart size. No pericardial effusion. No aortic dissection, aneurysm, or intramural hematoma. Porta catheter on the right with tip in good position at the upper cavoatrial junction. Mediastinum/Nodes: Negative for adenopathy or mass. Postoperative left breast. Lungs/Pleura: The central airways are clear. There is no edema, consolidation, effusion, or pneumothorax. Musculoskeletal: Patchy areas of thoracic spine sclerosis which are adjacent endplates and appear reactive. No acute finding. Review of the MIP images confirms the above findings. CTA ABDOMEN AND PELVIS FINDINGS VASCULAR Aorta: Normal caliber aorta without aneurysm, dissection, vasculitis or significant stenosis. Celiac: Patent without evidence of aneurysm, dissection, vasculitis or significant stenosis. SMA: Patent without evidence of aneurysm, dissection, vasculitis or significant stenosis. Renals: Unremarkable IMA: Patent Inflow: Unremarkable Veins: Unremarkable in the venous phase Review of the MIP images confirms the above findings.  NON-VASCULAR Hepatobiliary: No focal liver abnormality.Distended gallbladder and bile ducts. The common bile duct measures up to 1 cm in diameter. No visible choledocholithiasis. There is a calcified gallstone. Pancreas: Mild stranding around the pancreas, especially at the body and tail. No fluid collection or detectable necrosis. Spleen: Unremarkable. Adrenals/Urinary Tract: Negative adrenals. No hydronephrosis or stone. Unremarkable  bladder. Stomach/Bowel:  No obstruction. No appendicitis. Lymphatic: No mass or adenopathy. Reproductive:2.4 cm subserosal fibroid. Other: No ascites or pneumoperitoneum. Musculoskeletal: No acute abnormalities. Review of the MIP images confirms the above findings. IMPRESSION: Suspect mild acute edematous pancreatitis. There is cholelithiasis with biliary and gallbladder distension but no detected choledocholithiasis, consider MRCP. Normal CTA of the aorta. Electronically Signed   By: Monte Fantasia M.D.   On: 01/08/2021 07:28   US Abdomen Limited RUQ (LIVER/GB)  Result Date: 01/08/2021 CLINICAL DATA:  Right upper quadrant pain X 1 day. History of breast cancer. EXAM: ULTRASOUND ABDOMEN LIMITED RIGHT UPPER QUADRANT COMPARISON:  None. FINDINGS: Gallbladder: 8 mm calcified gallstone noted within the gallbladder lumen. No pericholecystic fluid or wall thickening visualized. No sonographic Murphy sign noted by sonographer. Intrahepatic biliary ducts are measuring at the upper limits of normal. Common bile duct: Diameter: Dilated measuring up to 10 mm. Liver: No focal lesion identified. Within normal limits in parenchymal echogenicity. Portal vein is patent on color Doppler imaging with normal direction of blood flow towards the liver. Other: None. IMPRESSION: 1. Cholelithiasis with no findings of acute cholecystitis. 2. Enlarged common bile duct concerning for choledocholithiasis. Correlate with liver function tests, and if clinically indicated consider MRCP for further evaluation.  Electronically Signed   By: Iven Finn M.D.   On: 01/08/2021 05:59      EKG: sinus tachycardia   A & P   Principal Problem:   RUQ pain Active Problems:   Malignant neoplasm of lower-outer quadrant of left breast of female, estrogen receptor positive (HCC)   Hypertensive urgency   Elevated troponin   1. RUQ pain, concern for choledocholithiasis with suspected mild acute pancreatitis a. Enlarged CBD concerning for choledocholithiasis on [place] however, no detected choledocholithiasis on CT. CT with mild pancreatitis, cholelithiasis, biliary and gallbladder distension. b. Lipase 25 c. Elevated LFTs -> trend CMP d. Zosyn e. NPO f. Morphine Q2h PRN for severe pain g. IV fluids h. GI consulted  2. Left breast cancer  a. Due for chemo today, Notified Dr. Lindi Adie  3. Hypertensive urgency, likely secondary to pain a. Continue pain regimen b. Labetalol PRN  4. Elevated troponin, suspect demand ischemia a. Troponin 19->28 b. Cardiology consulted by the ED provider    DVT prophylaxis: heparin   Code Status: Full Code  Diet: NPO Family Communication: Admission, patients condition and plan of care including tests being ordered have been discussed with the patient who indicates understanding and agrees with the plan and Code Status.  Disposition Plan: The appropriate patient status for this patient is OBSERVATION. Observation status is judged to be reasonable and necessary in order to provide the required intensity of service to ensure the patient's safety. The patient's presenting symptoms, physical exam findings, and initial radiographic and laboratory data in the context of their medical condition is felt to place them at decreased risk for further clinical deterioration. Furthermore, it is anticipated that the patient will be medically stable for discharge from the hospital within 2 midnights of admission. The following factors support the patient status of observation.   " The  patient's presenting symptoms include RUQ pain. " The physical exam findings include unremarkable. " The initial radiographic and laboratory data are concerning for choledocholithiasis and pancreatitis.    Consultants  . GI  Procedures  . none  Time Spent on Admission: 60 minutes    Harold Hedge, DO [facility]  01/08/2021, 8:50 AM

## 2021-01-08 NOTE — ED Notes (Signed)
Report to floor

## 2021-01-08 NOTE — ED Provider Notes (Addendum)
Care handoff received from [facility], PA-C at shift change please see previous provider note for full details of visit.  In short 55 year old female with a history of breast cancer currently on chemotherapy presented for right-sided mid back pain and right upper quadrant pain along with nonbloody/nonbilious emesis.  Checks x-ray was concerning for some free air in the abdomen, right upper quadrant ultrasound was concerning for cholelithiasis and possibly choledocholithiasis.  LFTs slightly elevated lipase was normal.  Plan of care is to obtain CT angio chest abdomen pelvis dissection study for further evaluation.  GI consult placed by night team, Dr. Cristina Gong aware of patient.  I have been asked to follow-up on CT scan and then disposition patient. Physical Exam  BP (!) 158/70   Pulse 94   Temp 98 F (36.7 C)   Resp 18   Ht 5\' 6"  (1.676 m)   Wt 99.8 kg   SpO2 98%   BMI 35.51 kg/m   Physical Exam Constitutional:      General: She is not in acute distress.    Appearance: Normal appearance. She is well-developed. She is not ill-appearing or diaphoretic.  HENT:     Head: Normocephalic and atraumatic.  Eyes:     General: Vision grossly intact. Gaze aligned appropriately.     Pupils: Pupils are equal, round, and reactive to light.  Neck:     Trachea: Trachea and phonation normal.  Pulmonary:     Effort: Pulmonary effort is normal. No respiratory distress.  Musculoskeletal:        General: Normal range of motion.     Cervical back: Normal range of motion.  Skin:    General: Skin is warm and dry.  Neurological:     Mental Status: She is alert.     GCS: GCS eye subscore is 4. GCS verbal subscore is 5. GCS motor subscore is 6.     Comments: Speech is clear and goal oriented, follows commands Major Cranial nerves without deficit, no facial droop Moves extremities without ataxia, coordination intact  Psychiatric:        Behavior: Behavior normal.     ED Course/Procedures   Clinical  Course as of 01/08/21 0735  Mon Jan 08, 2021  8242 GI notified by Dr. Randal Buba.  [MM]    Clinical Course User Index [MM] McDonald, Mia A, PA-C    Procedures   RUQ [place]:  IMPRESSION:  1. Cholelithiasis with no findings of acute cholecystitis.  2. Enlarged common bile duct concerning for choledocholithiasis.  Correlate with liver function tests, and if clinically indicated  consider MRCP for further evaluation.   DG Chest:  IMPRESSION:  1. No active disease.  2. Partially visualized foci of gas within the right upper quadrant  of unclear etiology. Finding likely represents gas within the large  bowel. Recommend x-ray abdomen for further evaluation.   CBC without leukocytosis.  Mild anemia 10.9.  CMP shows slight elevation of AST and ALT, total bilirubin 2.0.  No AKI.  Troponin slightly elevated at 19.  Lipase within normal limits.  EKG: Sinus tachycardia Confirmed by Randal Buba, April ([id number]) on 01/08/2021 5:09:14 AM   MDM  CT angio chest abdomen pelvis:  IMPRESSION:  Suspect mild acute edematous pancreatitis. There is cholelithiasis  with biliary and gallbladder distension but no detected  choledocholithiasis, consider MRCP.    Normal CTA of the aorta.   Covid test and delta troponin are pending. - 7:35 AM: I evaluated this patient, she is resting comfortably in bed  no acute distress remains slightly tachycardic rate around 110 bpm.  She reports her pain is under control.  She is aware of CT findings and is agreeable for admission.  I have reconsulted gastroenterology and ordered Zosyn for concern of choledocholithiasis. - Consult with gastroenterologist Dr. Watt Climes, is aware of patient agrees with Zosyn and asked for medicine admission. - 7:56 AM: Consult with Dr. [name], patient accepted hospital service for admission. ======================= Addendum: Delta troponin noted to be 28 up from 19 earlier, I placed consult to cardiology service.  In the meantime patient was  admitted and taken upstairs.  I spoke with Dr. Doylene Canard at 9:39 AM and reviewed case, Dr. Doylene Canard advises he will come to see the patient.  Dr. [name] updated.   Note: Portions of this report may have been transcribed using voice recognition software. Every effort was made to ensure accuracy; however, inadvertent computerized transcription errors may still be present.   Deliah Boston, PA-C 01/08/21 0804   Deliah Boston, PA-C 01/08/21 5400    Lorelle Gibbs, DO 01/12/21 1212

## 2021-01-08 NOTE — Progress Notes (Signed)
Pharmacy Antibiotic Note  Lindsay Hampton is a 55 y.o. female admitted on 01/08/2021 with IAI.  Pharmacy has been consulted for Zosyn dosing.  Plan: Zosyn 3.375g IV q8h (4 hour infusion).     Dosage will likely remain stable at above dose and need for further dosage adjustment appears unlikely at present.    Will sign off at this time.  Please reconsult if a change in clinical status warrants re-evaluation of dosage.     Height: 5\' 6"  (167.6 cm) Weight: 99.8 kg (220 lb) IBW/kg (Calculated) : 59.3  Temp (24hrs), Avg:98 F (36.7 C), Min:98 F (36.7 C), Max:98 F (36.7 C)  Recent Labs  Lab 01/01/21 0910 01/08/21 0535  WBC 5.7 5.8  CREATININE 0.73 0.62    Estimated Creatinine Clearance: 96.9 mL/min (by C-G formula based on SCr of 0.62 mg/dL).    No Known Allergies   Thank you for allowing pharmacy to be a part of this patient's care.  Royetta Asal, PharmD, BCPS 01/08/2021 8:45 AM

## 2021-01-09 ENCOUNTER — Encounter (HOSPITAL_COMMUNITY): Payer: Self-pay | Admitting: Internal Medicine

## 2021-01-09 ENCOUNTER — Observation Stay (HOSPITAL_COMMUNITY): Payer: 59 | Admitting: Certified Registered Nurse Anesthetist

## 2021-01-09 ENCOUNTER — Encounter (HOSPITAL_COMMUNITY)
Admission: EM | Disposition: A | Discharge status: Home / Self Care | Payer: Self-pay | Source: Home / Self Care | Attending: Internal Medicine

## 2021-01-09 ENCOUNTER — Other Ambulatory Visit: Payer: Self-pay

## 2021-01-09 ENCOUNTER — Observation Stay (HOSPITAL_COMMUNITY): Payer: 59

## 2021-01-09 DIAGNOSIS — K8067 Calculus of gallbladder and bile duct with acute and chronic cholecystitis with obstruction: Secondary | ICD-10-CM | POA: Diagnosis present

## 2021-01-09 DIAGNOSIS — K660 Peritoneal adhesions (postprocedural) (postinfection): Secondary | ICD-10-CM | POA: Diagnosis present

## 2021-01-09 DIAGNOSIS — Z79899 Other long term (current) drug therapy: Secondary | ICD-10-CM | POA: Diagnosis not present

## 2021-01-09 DIAGNOSIS — Z20822 Contact with and (suspected) exposure to covid-19: Secondary | ICD-10-CM | POA: Diagnosis present

## 2021-01-09 DIAGNOSIS — K838 Other specified diseases of biliary tract: Secondary | ICD-10-CM | POA: Diagnosis not present

## 2021-01-09 DIAGNOSIS — K8050 Calculus of bile duct without cholangitis or cholecystitis without obstruction: Secondary | ICD-10-CM

## 2021-01-09 DIAGNOSIS — I10 Essential (primary) hypertension: Secondary | ICD-10-CM | POA: Diagnosis present

## 2021-01-09 DIAGNOSIS — Z803 Family history of malignant neoplasm of breast: Secondary | ICD-10-CM | POA: Diagnosis not present

## 2021-01-09 DIAGNOSIS — Z6835 Body mass index (BMI) 35.0-35.9, adult: Secondary | ICD-10-CM | POA: Diagnosis not present

## 2021-01-09 DIAGNOSIS — R7989 Other specified abnormal findings of blood chemistry: Secondary | ICD-10-CM | POA: Diagnosis not present

## 2021-01-09 DIAGNOSIS — I160 Hypertensive urgency: Secondary | ICD-10-CM | POA: Diagnosis present

## 2021-01-09 DIAGNOSIS — Z9221 Personal history of antineoplastic chemotherapy: Secondary | ICD-10-CM | POA: Diagnosis not present

## 2021-01-09 DIAGNOSIS — K810 Acute cholecystitis: Secondary | ICD-10-CM | POA: Diagnosis not present

## 2021-01-09 DIAGNOSIS — R1011 Right upper quadrant pain: Secondary | ICD-10-CM | POA: Diagnosis present

## 2021-01-09 DIAGNOSIS — Z170 Estrogen receptor positive status [ER+]: Secondary | ICD-10-CM | POA: Diagnosis not present

## 2021-01-09 DIAGNOSIS — R778 Other specified abnormalities of plasma proteins: Secondary | ICD-10-CM | POA: Diagnosis not present

## 2021-01-09 DIAGNOSIS — E669 Obesity, unspecified: Secondary | ICD-10-CM | POA: Diagnosis present

## 2021-01-09 DIAGNOSIS — E876 Hypokalemia: Secondary | ICD-10-CM | POA: Diagnosis present

## 2021-01-09 DIAGNOSIS — I248 Other forms of acute ischemic heart disease: Secondary | ICD-10-CM | POA: Diagnosis present

## 2021-01-09 DIAGNOSIS — F419 Anxiety disorder, unspecified: Secondary | ICD-10-CM | POA: Diagnosis present

## 2021-01-09 DIAGNOSIS — R932 Abnormal findings on diagnostic imaging of liver and biliary tract: Secondary | ICD-10-CM | POA: Diagnosis not present

## 2021-01-09 DIAGNOSIS — K851 Biliary acute pancreatitis without necrosis or infection: Secondary | ICD-10-CM | POA: Diagnosis present

## 2021-01-09 DIAGNOSIS — C50512 Malignant neoplasm of lower-outer quadrant of left female breast: Secondary | ICD-10-CM | POA: Diagnosis present

## 2021-01-09 DIAGNOSIS — D649 Anemia, unspecified: Secondary | ICD-10-CM | POA: Diagnosis present

## 2021-01-09 DIAGNOSIS — Z86010 Personal history of colonic polyps: Secondary | ICD-10-CM | POA: Diagnosis not present

## 2021-01-09 DIAGNOSIS — K219 Gastro-esophageal reflux disease without esophagitis: Secondary | ICD-10-CM | POA: Diagnosis present

## 2021-01-09 DIAGNOSIS — R000 Tachycardia, unspecified: Secondary | ICD-10-CM | POA: Diagnosis present

## 2021-01-09 HISTORY — PX: CHOLECYSTECTOMY: SHX55

## 2021-01-09 LAB — COMPREHENSIVE METABOLIC PANEL
ALT: 157 U/L — ABNORMAL HIGH (ref 0–44)
AST: 142 U/L — ABNORMAL HIGH (ref 15–41)
Albumin: 3.3 g/dL — ABNORMAL LOW (ref 3.5–5.0)
Alkaline Phosphatase: 83 U/L (ref 38–126)
Anion gap: 10 (ref 5–15)
BUN: 9 mg/dL (ref 6–20)
CO2: 25 mmol/L (ref 22–32)
Calcium: 9.1 mg/dL (ref 8.9–10.3)
Chloride: 103 mmol/L (ref 98–111)
Creatinine, Ser: 0.66 mg/dL (ref 0.44–1.00)
GFR, Estimated: >60 mL/min (ref >60)
Glucose, Bld: 112 mg/dL — ABNORMAL HIGH (ref 70–99)
Potassium: 3.6 mmol/L (ref 3.5–5.1)
Sodium: 138 mmol/L (ref 135–145)
Total Bilirubin: 3.1 mg/dL — ABNORMAL HIGH (ref 0.3–1.2)
Total Protein: 6.2 g/dL — ABNORMAL LOW (ref 6.5–8.1)

## 2021-01-09 LAB — URINALYSIS, ROUTINE W REFLEX MICROSCOPIC
Bilirubin Urine: NEGATIVE
Glucose, UA: NEGATIVE mg/dL
Hgb urine dipstick: NEGATIVE
Ketones, ur: NEGATIVE mg/dL
Leukocytes,Ua: NEGATIVE
Nitrite: NEGATIVE
Protein, ur: NEGATIVE mg/dL
Specific Gravity, Urine: 1.024 (ref 1.005–1.030)
pH: 8 (ref 5.0–8.0)

## 2021-01-09 LAB — CBC
HCT: 30.8 % — ABNORMAL LOW (ref 36.0–46.0)
Hemoglobin: 10 g/dL — ABNORMAL LOW (ref 12.0–15.0)
MCH: 33.1 pg (ref 26.0–34.0)
MCHC: 32.5 g/dL (ref 30.0–36.0)
MCV: 102 fL — ABNORMAL HIGH (ref 80.0–100.0)
Platelets: 318 10*3/uL (ref 150–400)
RBC: 3.02 MIL/uL — ABNORMAL LOW (ref 3.87–5.11)
RDW: 15 % (ref 11.5–15.5)
WBC: 3.9 10*3/uL — ABNORMAL LOW (ref 4.0–10.5)
nRBC: 1 % — ABNORMAL HIGH (ref 0.0–0.2)

## 2021-01-09 LAB — RAPID URINE DRUG SCREEN, HOSP PERFORMED
Amphetamines: NOT DETECTED
Barbiturates: NOT DETECTED
Benzodiazepines: NOT DETECTED
Cocaine: NOT DETECTED
Opiates: POSITIVE — AB
Tetrahydrocannabinol: NOT DETECTED

## 2021-01-09 LAB — LIPASE, BLOOD: Lipase: 21 U/L (ref 11–51)

## 2021-01-09 IMAGING — RF DG CHOLANGIOGRAM OPERATIVE
1 series · 8 of 8 positions shown · non-contrast
Comparison: MRCP from [DATE]

CLINICAL DATA: 54-year-old female with history of cholelithiasis.

EXAM:
INTRAOPERATIVE CHOLANGIOGRAM

[Series 1: run · 2 acquisitions, 8 frames shown]
[im 1/2]
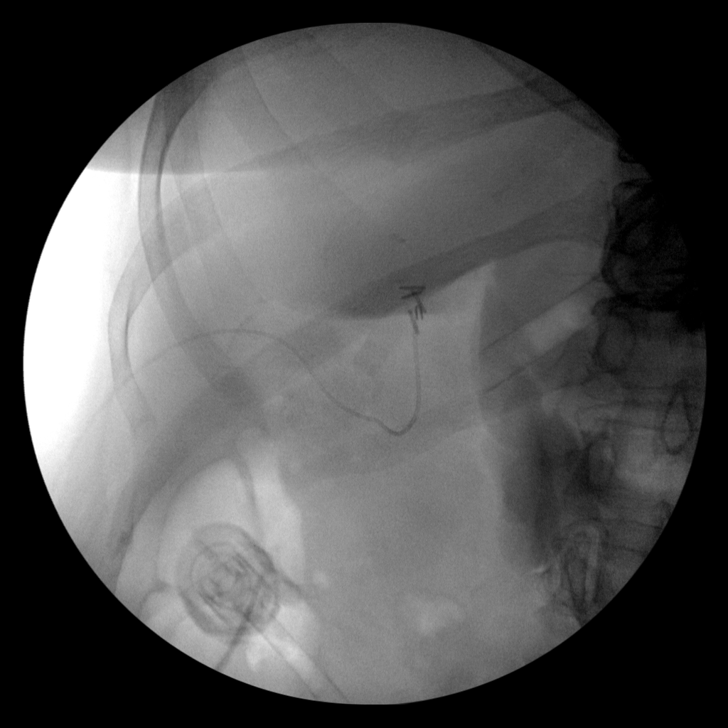
[im 1/2]
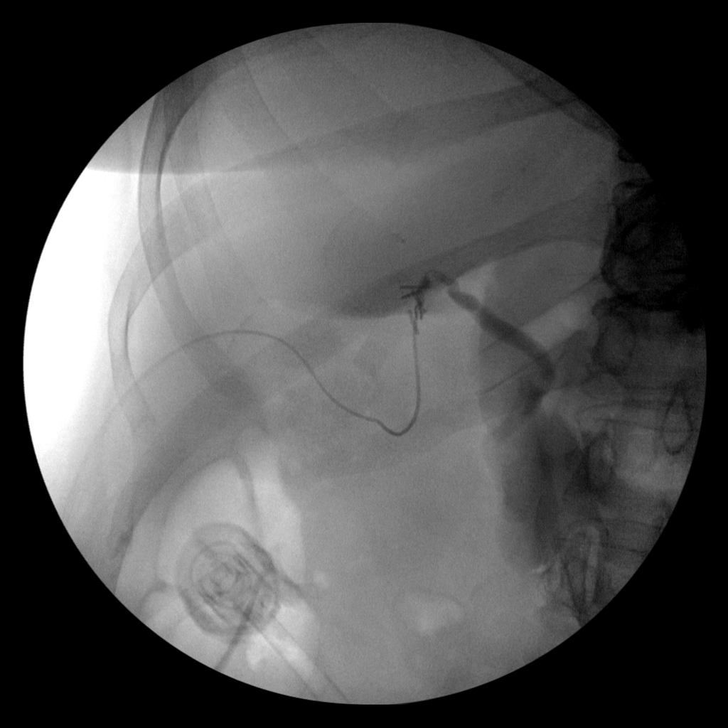
[im 1/2]
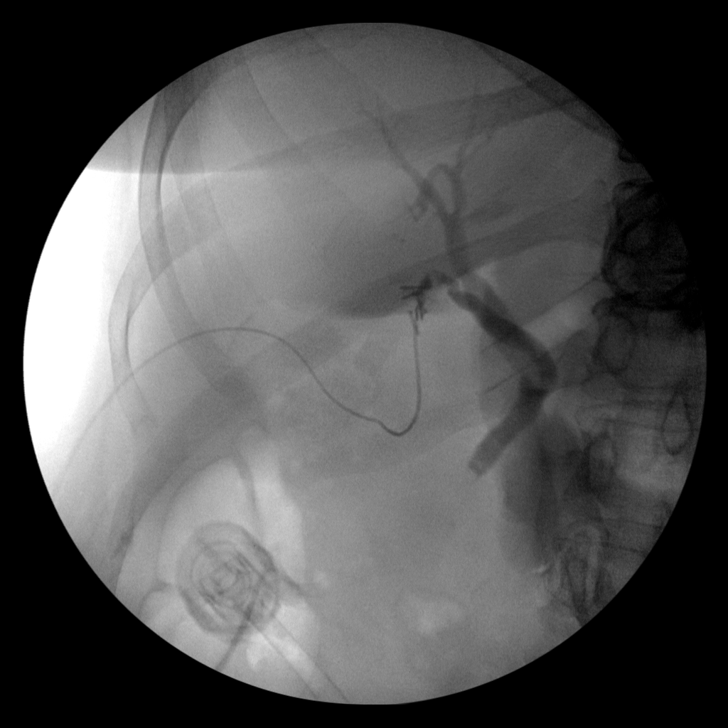
[im 1/2]
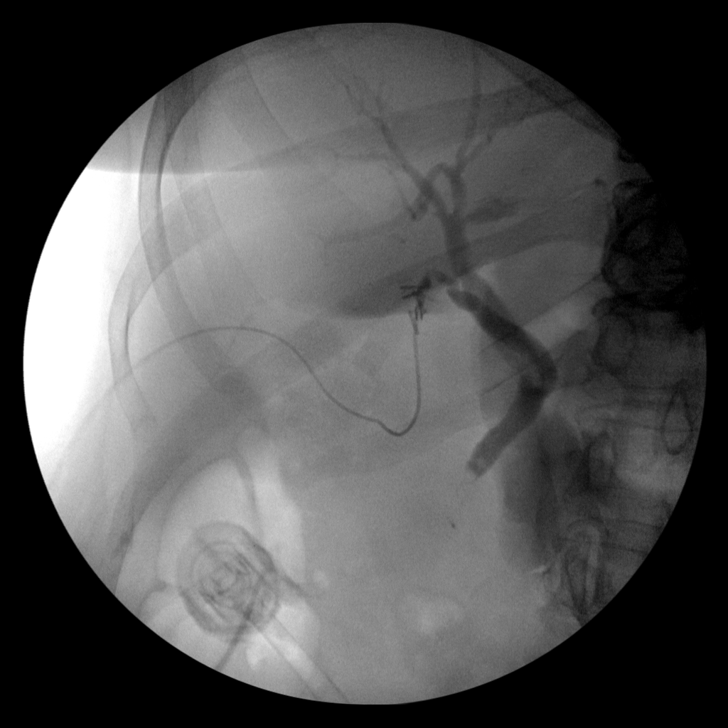
[im 2/2]
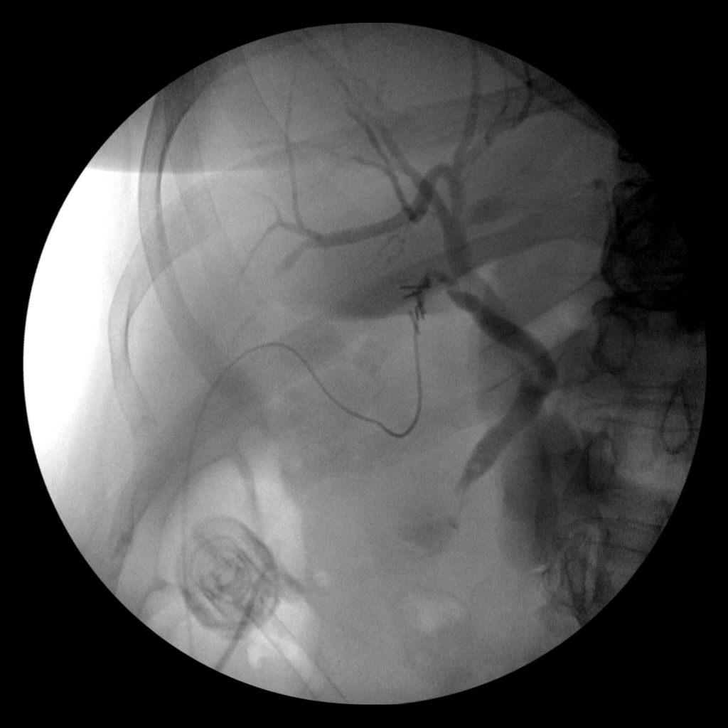
[im 2/2]
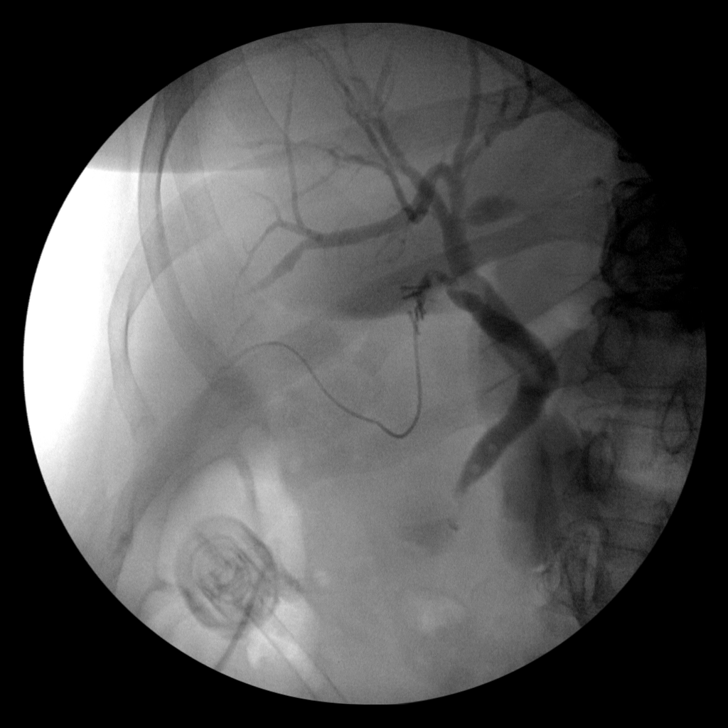
[im 2/2]
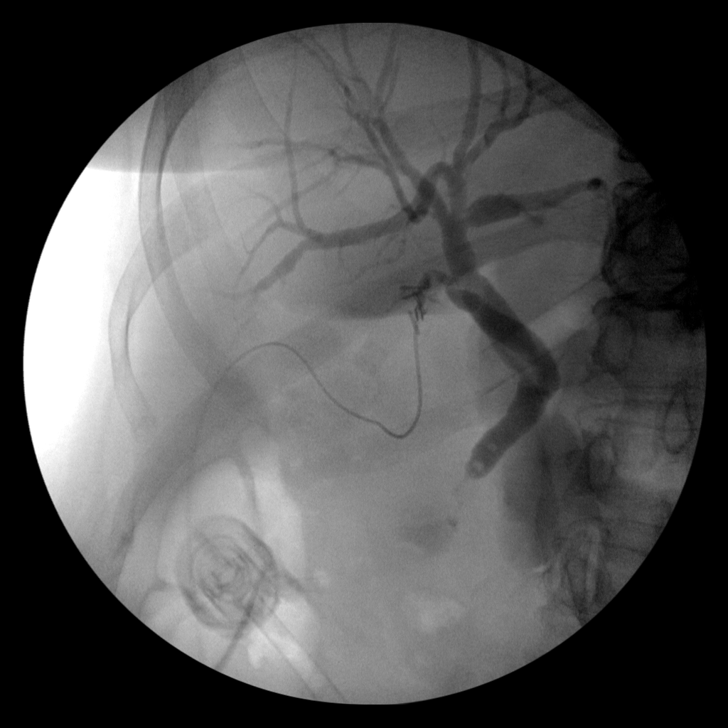
[im 2/2]
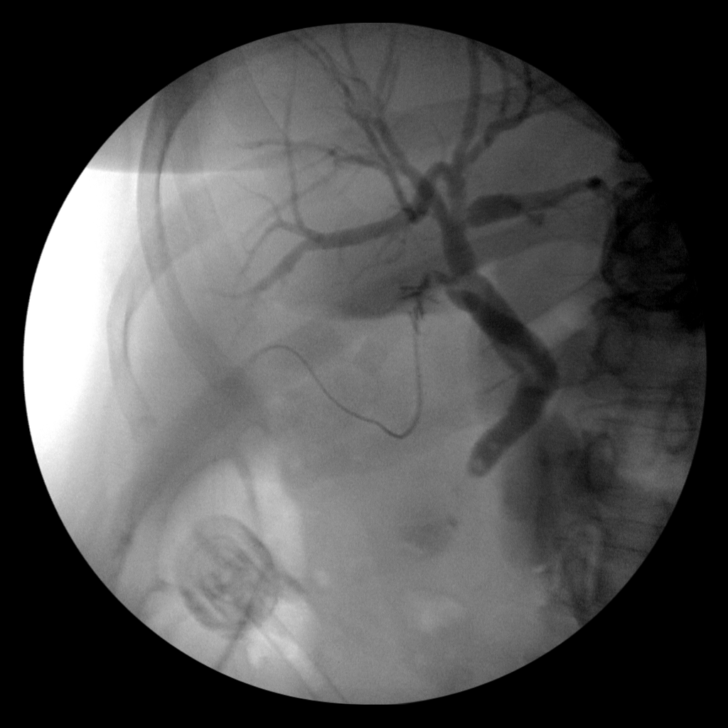

[8 of 8 positions shown; findings below may reference images not displayed]

FLUOROSCOPY TIME:  Fluoroscopy Time:  20 seconds

Number of Acquired Spot Images: 0
FINDINGS: Intraoperative cholangiogram with injection of the remnant cystic
duct performed after cholecystectomy demonstrates mid common bile
duct medial entry of the cystic duct. There is trifurcation of the
biliary confluence (Blumgart type B). There is mild central
intrahepatic and extrahepatic biliary ductal dilation. There are 2
partially mobile punctate filling defects in the distal common bile
duct. There is minimal contrast flowing into the duodenum.
IMPRESSION: Distal common bile duct choledocholithiasis, partially obstructive.

## 2021-01-09 SURGERY — LAPAROSCOPIC CHOLECYSTECTOMY WITH INTRAOPERATIVE CHOLANGIOGRAM
Anesthesia: General | Site: Abdomen

## 2021-01-09 MED ORDER — FENTANYL CITRATE (PF) 250 MCG/5ML IJ SOLN
INTRAMUSCULAR | Status: DC | PRN
  Administered 2021-01-09: 50 ug via INTRAVENOUS
  Administered 2021-01-09 (×2): 100 ug via INTRAVENOUS

## 2021-01-09 MED ORDER — PHENYLEPHRINE HCL (PRESSORS) 10 MG/ML IV SOLN
INTRAVENOUS | Status: AC
  Filled 2021-01-09: qty 1

## 2021-01-09 MED ORDER — BUPIVACAINE-EPINEPHRINE (PF) 0.25% -1:200000 IJ SOLN
INTRAMUSCULAR | Status: AC
  Filled 2021-01-09: qty 30

## 2021-01-09 MED ORDER — LIDOCAINE HCL (PF) 2 % IJ SOLN
INTRAMUSCULAR | Status: AC
  Filled 2021-01-09: qty 5

## 2021-01-09 MED ORDER — OXYCODONE HCL 5 MG PO TABS
5.0000 mg | ORAL_TABLET | ORAL | Status: DC | PRN
  Filled 2021-01-09: qty 2

## 2021-01-09 MED ORDER — IOHEXOL 300 MG/ML  SOLN
INTRAMUSCULAR | Status: DC | PRN
  Administered 2021-01-09: 6 mL

## 2021-01-09 MED ORDER — PROMETHAZINE HCL 25 MG/ML IJ SOLN: 6.2500 mg | INTRAMUSCULAR | Status: DC | PRN

## 2021-01-09 MED ORDER — PROPOFOL 10 MG/ML IV BOLUS
INTRAVENOUS | Status: DC | PRN
  Administered 2021-01-09: 200 mg via INTRAVENOUS

## 2021-01-09 MED ORDER — OXYCODONE HCL 5 MG PO TABS: 5.0000 mg | ORAL_TABLET | Freq: Once | ORAL | Status: DC | PRN

## 2021-01-09 MED ORDER — MIDAZOLAM HCL 2 MG/2ML IJ SOLN
INTRAMUSCULAR | Status: AC
  Filled 2021-01-09: qty 2

## 2021-01-09 MED ORDER — METHOCARBAMOL 1000 MG/10ML IJ SOLN
500.0000 mg | Freq: Four times a day (QID) | INTRAVENOUS | Status: DC | PRN
  Filled 2021-01-09: qty 5

## 2021-01-09 MED ORDER — 0.9 % SODIUM CHLORIDE (POUR BTL) OPTIME
TOPICAL | Status: DC | PRN
  Administered 2021-01-09: 1000 mL

## 2021-01-09 MED ORDER — ROCURONIUM BROMIDE 10 MG/ML (PF) SYRINGE
PREFILLED_SYRINGE | INTRAVENOUS | Status: AC
  Filled 2021-01-09: qty 10

## 2021-01-09 MED ORDER — FENTANYL CITRATE (PF) 100 MCG/2ML IJ SOLN: 25.0000 ug | INTRAMUSCULAR | Status: DC | PRN

## 2021-01-09 MED ORDER — ONDANSETRON HCL 4 MG/2ML IJ SOLN
INTRAMUSCULAR | Status: DC | PRN
  Administered 2021-01-09: 4 mg via INTRAVENOUS

## 2021-01-09 MED ORDER — DEXAMETHASONE SODIUM PHOSPHATE 10 MG/ML IJ SOLN
INTRAMUSCULAR | Status: DC | PRN
  Administered 2021-01-09: 10 mg via INTRAVENOUS

## 2021-01-09 MED ORDER — ROCURONIUM BROMIDE 10 MG/ML (PF) SYRINGE
PREFILLED_SYRINGE | INTRAVENOUS | Status: DC | PRN
  Administered 2021-01-09: 60 mg via INTRAVENOUS
  Administered 2021-01-09: 10 mg via INTRAVENOUS

## 2021-01-09 MED ORDER — LACTATED RINGERS IR SOLN
Status: DC | PRN
  Administered 2021-01-09: 1000 mL

## 2021-01-09 MED ORDER — PROPOFOL 10 MG/ML IV BOLUS
INTRAVENOUS | Status: AC
  Filled 2021-01-09: qty 20

## 2021-01-09 MED ORDER — PHENYLEPHRINE 40 MCG/ML (10ML) SYRINGE FOR IV PUSH (FOR BLOOD PRESSURE SUPPORT)
PREFILLED_SYRINGE | INTRAVENOUS | Status: DC | PRN
  Administered 2021-01-09: 120 ug via INTRAVENOUS
  Administered 2021-01-09: 80 ug via INTRAVENOUS
  Administered 2021-01-09: 120 ug via INTRAVENOUS
  Administered 2021-01-09: 80 ug via INTRAVENOUS
  Administered 2021-01-09: 120 ug via INTRAVENOUS
  Administered 2021-01-09: 80 ug via INTRAVENOUS
  Administered 2021-01-09: 120 ug via INTRAVENOUS

## 2021-01-09 MED ORDER — LIDOCAINE 2% (20 MG/ML) 5 ML SYRINGE
INTRAMUSCULAR | Status: DC | PRN
  Administered 2021-01-09: 60 mg via INTRAVENOUS

## 2021-01-09 MED ORDER — ONDANSETRON HCL 4 MG/2ML IJ SOLN
INTRAMUSCULAR | Status: AC
  Filled 2021-01-09: qty 2

## 2021-01-09 MED ORDER — INDOMETHACIN 50 MG RE SUPP
100.0000 mg | Freq: Once | RECTAL | Status: DC
  Filled 2021-01-09: qty 2

## 2021-01-09 MED ORDER — SUGAMMADEX SODIUM 200 MG/2ML IV SOLN
INTRAVENOUS | Status: DC | PRN
  Administered 2021-01-09: 200 mg via INTRAVENOUS

## 2021-01-09 MED ORDER — DEXAMETHASONE SODIUM PHOSPHATE 10 MG/ML IJ SOLN
INTRAMUSCULAR | Status: AC
  Filled 2021-01-09: qty 1

## 2021-01-09 MED ORDER — PHENYLEPHRINE 40 MCG/ML (10ML) SYRINGE FOR IV PUSH (FOR BLOOD PRESSURE SUPPORT)
PREFILLED_SYRINGE | INTRAVENOUS | Status: AC
  Filled 2021-01-09: qty 20

## 2021-01-09 MED ORDER — MIDAZOLAM HCL 5 MG/5ML IJ SOLN
INTRAMUSCULAR | Status: DC | PRN
  Administered 2021-01-09: 2 mg via INTRAVENOUS

## 2021-01-09 MED ORDER — PHENYLEPHRINE HCL-NACL 10-0.9 MG/250ML-% IV SOLN
INTRAVENOUS | Status: DC | PRN
  Administered 2021-01-09: 75 ug/min via INTRAVENOUS

## 2021-01-09 MED ORDER — FENTANYL CITRATE (PF) 250 MCG/5ML IJ SOLN
INTRAMUSCULAR | Status: AC
  Filled 2021-01-09: qty 5

## 2021-01-09 MED ORDER — NALOXONE HCL 0.4 MG/ML IJ SOLN
INTRAMUSCULAR | Status: AC
  Filled 2021-01-09: qty 1

## 2021-01-09 MED ORDER — SODIUM CHLORIDE 0.9 % IV SOLN
1.5000 g | INTRAVENOUS | Status: DC
  Filled 2021-01-09: qty 4

## 2021-01-09 MED ORDER — LACTATED RINGERS IV SOLN: INTRAVENOUS | Status: DC | PRN

## 2021-01-09 MED ORDER — BUPIVACAINE-EPINEPHRINE 0.25% -1:200000 IJ SOLN
INTRAMUSCULAR | Status: DC | PRN
  Administered 2021-01-09: 20 mL

## 2021-01-09 MED ORDER — OXYCODONE HCL 5 MG/5ML PO SOLN: 5.0000 mg | Freq: Once | ORAL | Status: DC | PRN

## 2021-01-09 MED ORDER — KETOROLAC TROMETHAMINE 30 MG/ML IJ SOLN
30.0000 mg | Freq: Once | INTRAMUSCULAR | Status: AC
  Administered 2021-01-09: 30 mg via INTRAVENOUS
  Filled 2021-01-09: qty 1

## 2021-01-09 MED ORDER — MORPHINE SULFATE (PF) 2 MG/ML IV SOLN
2.0000 mg | INTRAVENOUS | Status: DC | PRN
Start: 2021-01-09 — End: 2021-01-11
  Administered 2021-01-10: 2 mg via INTRAVENOUS
  Filled 2021-01-09: qty 1

## 2021-01-09 MED ORDER — ACETAMINOPHEN 325 MG PO TABS: 650.0000 mg | ORAL_TABLET | Freq: Four times a day (QID) | ORAL | Status: DC | PRN

## 2021-01-09 SURGICAL SUPPLY — 43 items
APL PRP STRL LF DISP 70% ISPRP (MISCELLANEOUS) ×1
APL SKNCLS STERI-STRIP NONHPOA (GAUZE/BANDAGES/DRESSINGS) ×1
APPLIER CLIP ROT 10 11.4 M/L (STAPLE) ×2
APR CLP MED LRG 11.4X10 (STAPLE) ×1
BAG SPEC RTRVL LRG 6X4 10 (ENDOMECHANICALS) ×1
BENZOIN TINCTURE PRP APPL 2/3 (GAUZE/BANDAGES/DRESSINGS) ×2 IMPLANT
CHLORAPREP W/TINT 26 (MISCELLANEOUS) ×2 IMPLANT
CLIP APPLIE ROT 10 11.4 M/L (STAPLE) ×1 IMPLANT
COVER MAYO STAND STRL (DRAPES) ×2 IMPLANT
COVER SURGICAL LIGHT HANDLE (MISCELLANEOUS) ×2 IMPLANT
COVER WAND RF STERILE (DRAPES) IMPLANT
DECANTER SPIKE VIAL GLASS SM (MISCELLANEOUS) ×2 IMPLANT
DRAPE C-ARM 42X120 X-RAY (DRAPES) ×2 IMPLANT
DRSG TEGADERM 2-3/8X2-3/4 SM (GAUZE/BANDAGES/DRESSINGS) ×6 IMPLANT
DRSG TEGADERM 4X4.75 (GAUZE/BANDAGES/DRESSINGS) ×2 IMPLANT
ELECT REM PT RETURN 15FT ADLT (MISCELLANEOUS) ×2 IMPLANT
GAUZE SPONGE 2X2 8PLY STRL LF (GAUZE/BANDAGES/DRESSINGS) IMPLANT
GLOVE SURG ENC MOIS LTX SZ7 (GLOVE) ×2 IMPLANT
GLOVE SURG UNDER POLY LF SZ7.5 (GLOVE) ×2 IMPLANT
GOWN STRL REUS W/TWL LRG LVL3 (GOWN DISPOSABLE) ×3 IMPLANT
GOWN STRL REUS W/TWL XL LVL3 (GOWN DISPOSABLE) ×2 IMPLANT
KIT BASIN OR (CUSTOM PROCEDURE TRAY) ×2 IMPLANT
KIT TURNOVER KIT A (KITS) ×2 IMPLANT
NS IRRIG 1000ML POUR BTL (IV SOLUTION) ×2 IMPLANT
PENCIL SMOKE EVACUATOR (MISCELLANEOUS) IMPLANT
POUCH SPECIMEN RETRIEVAL 10MM (ENDOMECHANICALS) ×2 IMPLANT
PROTECTOR NERVE ULNAR (MISCELLANEOUS) IMPLANT
SCISSORS LAP 5X35 DISP (ENDOMECHANICALS) ×2 IMPLANT
SET CHOLANGIOGRAPH MIX (MISCELLANEOUS) ×2 IMPLANT
SET IRRIG TUBING LAPAROSCOPIC (IRRIGATION / IRRIGATOR) ×2 IMPLANT
SET TUBE SMOKE EVAC HIGH FLOW (TUBING) ×2 IMPLANT
SPONGE GAUZE 2X2 STER 10/PKG (GAUZE/BANDAGES/DRESSINGS) ×1
STRIP CLOSURE SKIN 1/2X4 (GAUZE/BANDAGES/DRESSINGS) ×2 IMPLANT
SUT MNCRL AB 4-0 PS2 18 (SUTURE) ×2 IMPLANT
SYR 20ML LL LF (SYRINGE) ×1 IMPLANT
TAPE CLOTH 4X10 WHT NS (GAUZE/BANDAGES/DRESSINGS) IMPLANT
TOWEL OR 17X26 10 PK STRL BLUE (TOWEL DISPOSABLE) ×2 IMPLANT
TOWEL OR NON WOVEN STRL DISP B (DISPOSABLE) ×2 IMPLANT
TRAY LAPAROSCOPIC (CUSTOM PROCEDURE TRAY) ×2 IMPLANT
TROCAR BLADELESS OPT 5 100 (ENDOMECHANICALS) ×4 IMPLANT
TROCAR XCEL BLUNT TIP 100MML (ENDOMECHANICALS) ×2 IMPLANT
TROCAR XCEL NON-BLD 11X100MML (ENDOMECHANICALS) ×2 IMPLANT
WATER STERILE IRR 1000ML POUR (IV SOLUTION) ×2 IMPLANT

## 2021-01-09 NOTE — Op Note (Signed)
Laparoscopic Cholecystectomy with IOC Procedure Note  Indications: This patient presents with jaundice and a dilated common bile duct.  MRCP showed no obvious choledocholithiasis, although she remains with an elevated bilirubin.  We will proceed with lap chole with IOC.   Pre-operative Diagnosis: Calculus of gallbladder with other cholecystitis, without mention of obstruction  Post-operative Diagnosis: Calculus of gallbladder with other cholecystitis and obstruction  Surgeon: Maia Petties   Resident:  Dr. Coralyn Pear Economopoulos I was personally present during the key and critical portions of this procedure and immediately available throughout the entire procedure, as documented in my operative note.   Anesthesia: General endotracheal anesthesia  ASA Class: 2  Procedure Details  The patient was seen again in the Holding Room. The risks, benefits, complications, treatment options, and expected outcomes were discussed with the patient. The possibilities of reaction to medication, pulmonary aspiration, perforation of viscus, bleeding, recurrent infection, finding a normal gallbladder, the need for additional procedures, failure to diagnose a condition, the possible need to convert to an open procedure, and creating a complication requiring transfusion or operation were discussed with the patient. The likelihood of improving the patient's symptoms with return to their baseline status is good.  The patient and/or family concurred with the proposed plan, giving informed consent. The site of surgery properly noted. The patient was taken to Operating Room, identified as [place] and the procedure verified as Laparoscopic Cholecystectomy with Intraoperative Cholangiogram. A Time Out was held and the above information confirmed.  Prior to the induction of general anesthesia, antibiotic prophylaxis was administered. General endotracheal anesthesia was then administered and tolerated well.  After the induction, the abdomen was prepped with Chloraprep and draped in the sterile fashion. The patient was positioned in the supine position.  Local anesthetic agent was injected into the skin near the umbilicus and an incision made. We dissected down to the abdominal fascia with blunt dissection.  The fascia was incised vertically and we entered the peritoneal cavity bluntly.  A pursestring suture of 0-Vicryl was placed around the fascial opening.  The Hasson cannula was inserted and secured with the stay suture.  Pneumoperitoneum was then created with CO2 and tolerated well without any adverse changes in the patient's vital signs. An 11-mm port was placed in the subxiphoid position.  Two 5-mm ports were placed in the right upper quadrant. All skin incisions were infiltrated with a local anesthetic agent before making the incision and placing the trocars.   We positioned the patient in reverse Trendelenburg, tilted slightly to the patient's left.  The gallbladder was identified, the fundus grasped and retracted cephalad.  There are omental adhesions to the gallbladder and liver. Adhesions were lysed bluntly and with the electrocautery where indicated, taking care not to injure any adjacent organs or viscus. The infundibulum was grasped and retracted laterally, exposing the peritoneum overlying the triangle of Calot. This was then divided and exposed in a blunt fashion. A critical view of the cystic duct and cystic artery was obtained.  The cystic duct was clearly identified and bluntly dissected circumferentially. The cystic duct was ligated with a clip distally.   An incision was made in the cystic duct and the [facility] cholangiogram catheter introduced. The catheter was secured using a clip. A cholangiogram was then obtained which showed good visualization of the distal and proximal biliary tree.  There are two filling defects distally that did not move or flush into the duodenum.  Some contrast was seen  entering the duodenum. The  catheter was then removed.   The cystic duct was then ligated with clips and divided. The cystic artery was identified, dissected free, ligated with clips and divided as well.   The gallbladder was dissected from the liver bed in retrograde fashion with the electrocautery. The gallbladder was removed and placed in an Endocatch sac. The liver bed was irrigated and inspected. Hemostasis was achieved with the electrocautery. Copious irrigation was utilized and was repeatedly aspirated until clear.  The gallbladder and Endocatch sac were then removed through the umbilical port site.  The pursestring suture was used to close the umbilical fascia.    We again inspected the right upper quadrant for hemostasis.  Pneumoperitoneum was released as we removed the trocars.  4-0 Monocryl was used to close the skin.   Benzoin, steri-strips, and clean dressings were applied. The patient was then extubated and brought to the recovery room in stable condition. Instrument, sponge, and needle counts were correct at closure and at the conclusion of the case.   Findings: Cholecystitis with Cholelithiasis and biliary obstruction  Estimated Blood Loss: Minimal         Drains: none         Specimens: Gallbladder           Complications: None; patient tolerated the procedure well.         Disposition: PACU - hemodynamically stable.         Condition: stable Probable ERCP by GI   Imogene Burn. [name], MD, [facility] Surgery  General/ Trauma Surgery   01/09/2021 11:22 AM

## 2021-01-09 NOTE — H&P (View-Only) (Signed)
Progress Note   Subjective  Patient feeling well after laparoscopic cholecystectomy No abdominal pain No nausea Husband at bedside   Objective  Vital signs in last 24 hours: Temp:  [97.4 F (36.3 C)-98.9 F (37.2 C)] 97.8 F (36.6 C) (02/15 1741) Pulse Rate:  [81-103] 96 (02/15 1741) Resp:  [8-26] 18 (02/15 1741) BP: (135-154)/(80-102) 146/90 (02/15 1741) SpO2:  [97 %-100 %] 98 % (02/15 1741) Last BM Date: 01/07/21  Gen: awake, alert, NAD HEENT: Mildly icteric sclera CV: RRR, no mrg Pulm: CTA b/l Abd: soft, NT/ND, +BS throughout Ext: no c/c/e Neuro: nonfocal   Intake/Output from previous day: 02/14 0701 - 02/15 0700 In: 488.1 [P.O.:240; I.V.:198.2; IV Piggyback:49.9] Out: 2400 [Urine:2400] Intake/Output this shift: Total I/O In: 1293.2 [I.V.:1200; IV Piggyback:93.2] Out: 510 [Urine:500; Blood:10]  Lab Results: Recent Labs    01/08/21 0535 01/09/21 0313  WBC 5.8 3.9*  HGB 10.9* 10.0*  HCT 33.4* 30.8*  PLT 330 318   BMET Recent Labs    01/08/21 0535 01/09/21 0313  NA 136 138  K 3.8 3.6  CL 102 103  CO2 22 25  GLUCOSE 191* 112*  BUN 15 9  CREATININE 0.62 0.66  CALCIUM 9.7 9.1   LFT Recent Labs    01/09/21 0313  PROT 6.2*  ALBUMIN 3.3*  AST 142*  ALT 157*  ALKPHOS 83  BILITOT 3.1*   PT/INR No results for input(s): LABPROT, INR in the last 72 hours. Hepatitis Panel No results for input(s): HEPBSAG, HCVAB, HEPAIGM, HEPBIGM in the last 72 hours.  Studies/Results: DG Cholangiogram Operative  Result Date: 01/09/2021 CLINICAL DATA:  55 year old female with history of cholelithiasis. EXAM: INTRAOPERATIVE CHOLANGIOGRAM COMPARISON:  MRCP from 01/08/2021 FLUOROSCOPY TIME:  Fluoroscopy Time:  20 seconds Number of Acquired Spot Images: 0 FINDINGS: Intraoperative cholangiogram with injection of the remnant cystic duct performed after cholecystectomy demonstrates mid common bile duct medial entry of the cystic duct. There is trifurcation of the  biliary confluence (Blumgart type B). There is mild central intrahepatic and extrahepatic biliary ductal dilation. There are 2 partially mobile punctate filling defects in the distal common bile duct. There is minimal contrast flowing into the duodenum. IMPRESSION: Distal common bile duct choledocholithiasis, partially obstructive. Ruthann Cancer, MD [facility] Radiology Electronically Signed   By: Ruthann Cancer MD   On: 01/09/2021 14:11   MR 3D Recon At Scanner  Result Date: 01/08/2021 CLINICAL DATA:  Right upper quadrant pain. Jaundice. Breast cancer. Evaluate possible pancreatitis. EXAM: MRI ABDOMEN WITHOUT AND WITH CONTRAST (INCLUDING MRCP) TECHNIQUE: Multiplanar multisequence MR imaging of the abdomen was performed both before and after the administration of intravenous contrast. Heavily T2-weighted images of the biliary and pancreatic ducts were obtained, and three-dimensional MRCP images were rendered by post processing. CONTRAST:  65mL GADAVIST GADOBUTROL 1 MMOL/ML IV SOLN COMPARISON:  9 cc Gadavist abdominal ultrasound of earlier today. CTA of the chest, abdomen, and pelvis of earlier today. FINDINGS: Lower chest: Normal heart size without pericardial or pleural effusion. Hepatobiliary: No focal liver lesion. Moderate intrahepatic biliary duct dilatation. Multiple tiny gallstones without specific evidence of acute cholecystitis. Moderate common duct dilatation, including at 1.0 cm on 15/12. The common duct tapers gradually, without evidence of obstructive stone or mass. Pancreas: Mild peripancreatic edema, including on 21/4. No evidence of pancreatic necrosis, duct dilatation, or well-circumscribed peripancreatic fluid collection. Spleen:  Normal in size, without focal abnormality. Adrenals/Urinary Tract: Normal adrenal glands. Normal kidneys, without hydronephrosis. Stomach/Bowel: Tiny hiatal hernia.  Normal abdominal bowel loops. Vascular/Lymphatic:  Aortic atherosclerosis. Retroaortic left renal vein. No retroperitoneal or retrocrural adenopathy. Other:  Trace perihepatic ascites. Musculoskeletal: No acute osseous abnormality. Minimal convex left lumbar spine curvature. IMPRESSION: 1. Cholelithiasis. Moderate intra and extrahepatic biliary duct dilatation, without obstructive stone or mass. Favor recent stone passage. Consider surveillance of bilirubin. If persistently elevated, ERCP should be considered to exclude less likely occult ampullary stenosis. 2. Non complicated mild pancreatitis. 3.  Aortic Atherosclerosis (ICD10-I70.0). 4.  Tiny hiatal hernia. 5. Trace perihepatic ascites. Electronically Signed   By: Abigail Miyamoto M.D.   On: 01/08/2021 12:59   DG Chest Portable 1 View  Result Date: 01/08/2021 CLINICAL DATA:  Chest pain, vomiting, upper back EXAM: PORTABLE CHEST 1 VIEW COMPARISON:  Chest x-ray 09/12/2020. FINDINGS: Right chest wall Port-A-Cath with tip overlying the expected region of the superior cavoatrial junction. The heart size and mediastinal contours are within normal limits. No focal consolidation. No pulmonary edema. No pleural effusion. No pneumothorax. No acute osseous abnormality. Partially visualized foci of gas within the right upper quadrant of unclear etiology. IMPRESSION: 1. No active disease. 2. Partially visualized foci of gas within the right upper quadrant of unclear etiology. Finding likely represents gas within the large bowel. Recommend x-ray abdomen for further evaluation. Electronically Signed   By: Iven Finn M.D.   On: 01/08/2021 05:56   MR ABDOMEN MRCP W WO CONTAST  Result Date: 01/08/2021 CLINICAL DATA:  Right upper quadrant pain. Jaundice. Breast cancer. Evaluate possible pancreatitis. EXAM: MRI ABDOMEN WITHOUT AND WITH CONTRAST (INCLUDING MRCP) TECHNIQUE: Multiplanar multisequence MR imaging of the abdomen was performed both before and after the administration of intravenous contrast. Heavily T2-weighted  images of the biliary and pancreatic ducts were obtained, and three-dimensional MRCP images were rendered by post processing. CONTRAST:  15mL GADAVIST GADOBUTROL 1 MMOL/ML IV SOLN COMPARISON:  9 cc Gadavist abdominal ultrasound of earlier today. CTA of the chest, abdomen, and pelvis of earlier today. FINDINGS: Lower chest: Normal heart size without pericardial or pleural effusion. Hepatobiliary: No focal liver lesion. Moderate intrahepatic biliary duct dilatation. Multiple tiny gallstones without specific evidence of acute cholecystitis. Moderate common duct dilatation, including at 1.0 cm on 15/12. The common duct tapers gradually, without evidence of obstructive stone or mass. Pancreas: Mild peripancreatic edema, including on 21/4. No evidence of pancreatic necrosis, duct dilatation, or well-circumscribed peripancreatic fluid collection. Spleen:  Normal in size, without focal abnormality. Adrenals/Urinary Tract: Normal adrenal glands. Normal kidneys, without hydronephrosis. Stomach/Bowel: Tiny hiatal hernia.  Normal abdominal bowel loops. Vascular/Lymphatic: Aortic atherosclerosis. Retroaortic left renal vein. No retroperitoneal or retrocrural adenopathy. Other:  Trace perihepatic ascites. Musculoskeletal: No acute osseous abnormality. Minimal convex left lumbar spine curvature. IMPRESSION: 1. Cholelithiasis. Moderate intra and extrahepatic biliary duct dilatation, without obstructive stone or mass. Favor recent stone passage. Consider surveillance of bilirubin. If persistently elevated, ERCP should be considered to exclude less likely occult ampullary stenosis. 2. Non complicated mild pancreatitis. 3.  Aortic Atherosclerosis (ICD10-I70.0). 4.  Tiny hiatal hernia. 5. Trace perihepatic ascites. Electronically Signed   By: Abigail Miyamoto M.D.   On: 01/08/2021 12:59   ECHOCARDIOGRAM COMPLETE  Result Date: 01/08/2021    ECHOCARDIOGRAM REPORT   Patient Name:   NATHASHA FIORILLO Soderlund Date of Exam: 01/08/2021 Medical Rec #:   [id number]       Height:       66.0 in Accession #:    [id number]      Weight:       220.0 lb Date of  Birth:  1966-01-09       BSA:          2.082 m Patient Age:    53 years        BP:           152/89 mmHg Patient Gender: F               HR:           99 bpm. Exam Location:  Inpatient Procedure: 2D Echo, Cardiac Doppler, Color Doppler, 3D Echo and Strain Analysis Indications:     Z51.11 Encounter for antineoplastic chemotheraphy  History:         Patient has prior history of Echocardiogram examinations, most                  recent 09/08/2020. Risk Factors:Hypertension and GERD. Breast                  Cancer.  Sonographer:     Tiffany Dance Referring Phys:  [place] Diagnosing Phys: Dixie Dials MD IMPRESSIONS  1. GLS Endo Peak A4C: -17 %, A2C is -14.6 % and A3C is -17.7 %.. Left ventricular ejection fraction, by estimation, is 60 to 65%. The left ventricle has normal function. The left ventricle has no regional wall motion abnormalities. Left ventricular diastolic parameters are consistent with Grade I diastolic dysfunction (impaired relaxation).  2. Right ventricular systolic function is normal. The right ventricular size is normal.  3. Left atrial size was mildly dilated.  4. The mitral valve is degenerative. Mild mitral valve regurgitation.  5. The aortic valve is tricuspid. There is mild calcification of the aortic valve. There is mild thickening of the aortic valve. Aortic valve regurgitation is not visualized.  6. The inferior vena cava is normal in size with greater than 50% respiratory variability, suggesting right atrial pressure of 3 mmHg. Comparison(s): No significant change from prior study. FINDINGS  Left Ventricle: GLS Endo Peak A4C: -17 %, A2C is -14.6 % and A3C is -17.7 %. Left ventricular ejection fraction, by estimation, is 60 to 65%. The left ventricle has normal function. The left ventricle has no regional wall motion abnormalities. The left ventricular internal cavity size was normal  in size. There is no left ventricular hypertrophy. Left ventricular diastolic parameters are consistent with Grade I diastolic dysfunction (impaired relaxation). Right Ventricle: The right ventricular size is normal. No increase in right ventricular wall thickness. Right ventricular systolic function is normal. Left Atrium: Left atrial size was mildly dilated. Right Atrium: Right atrial size was normal in size. Pericardium: There is no evidence of pericardial effusion. Mitral Valve: The mitral valve is degenerative in appearance. There is mild calcification of the mitral valve leaflet(s). Mild mitral annular calcification. Mild mitral valve regurgitation. Tricuspid Valve: The tricuspid valve is normal in structure. Tricuspid valve regurgitation is mild. Aortic Valve: The aortic valve is tricuspid. There is mild calcification of the aortic valve. There is mild thickening of the aortic valve. Aortic valve regurgitation is not visualized. Pulmonic Valve: The pulmonic valve was normal in structure. Pulmonic valve regurgitation is not visualized. Aorta: The aortic root is normal in size and structure. Venous: The inferior vena cava is normal in size with greater than 50% respiratory variability, suggesting right atrial pressure of 3 mmHg. IAS/Shunts: The interatrial septum was not well visualized.  LEFT VENTRICLE PLAX 2D LVIDd:         4.70 cm  Diastology LVIDs:  3.50 cm  LV e' medial:    7.72 cm/s LV PW:         1.50 cm  LV E/e' medial:  10.5 LV IVS:        1.00 cm  LV e' lateral:   7.40 cm/s LVOT diam:     1.90 cm  LV E/e' lateral: 11.0 LV SV:         56 LV SV Index:   27 LVOT Area:     2.84 cm  RIGHT VENTRICLE             IVC RV Basal diam:  2.50 cm     IVC diam: 1.40 cm RV S prime:     15.10 cm/s TAPSE (M-mode): 2.5 cm LEFT ATRIUM             Index       RIGHT ATRIUM           Index LA diam:        4.30 cm 2.06 cm/m  RA Area:     10.50 cm LA Vol (A2C):   63.7 ml 30.59 ml/m RA Volume:   22.90 ml  11.00  ml/m LA Vol (A4C):   43.1 ml 20.70 ml/m LA Biplane Vol: 53.6 ml 25.74 ml/m  AORTIC VALVE LVOT Vmax:   101.00 cm/s LVOT Vmean:  63.800 cm/s LVOT VTI:    0.197 m  AORTA Ao Root diam: 3.20 cm Ao Asc diam:  3.30 cm MITRAL VALVE MV Area (PHT): 3.37 cm    SHUNTS MV Decel Time: 225 msec    Systemic VTI:  0.20 m MV E velocity: 81.20 cm/s  Systemic Diam: 1.90 cm MV A velocity: 88.60 cm/s MV E/A ratio:  0.92 Dixie Dials MD Electronically signed by Dixie Dials MD Signature Date/Time: 01/08/2021/6:20:56 PM    Final    CT Angio Chest/Abd/Pel for Dissection W and/or Wo Contrast  Result Date: 01/08/2021 CLINICAL DATA:  Left-sided breast cancer undergoing chemotherapy. Vomiting and upper back pain EXAM: CT ANGIOGRAPHY CHEST, ABDOMEN AND PELVIS TECHNIQUE: Non-contrast CT of the chest was initially obtained. Multidetector CT imaging through the chest, abdomen and pelvis was performed using the standard protocol during bolus administration of intravenous contrast. Multiplanar reconstructed images and MIPs were obtained and reviewed to evaluate the vascular anatomy. CONTRAST:  187mL OMNIPAQUE IOHEXOL 350 MG/ML SOLN COMPARISON:  None. FINDINGS: CTA CHEST FINDINGS Cardiovascular: Normal heart size. No pericardial effusion. No aortic dissection, aneurysm, or intramural hematoma. Porta catheter on the right with tip in good position at the upper cavoatrial junction. Mediastinum/Nodes: Negative for adenopathy or mass. Postoperative left breast. Lungs/Pleura: The central airways are clear. There is no edema, consolidation, effusion, or pneumothorax. Musculoskeletal: Patchy areas of thoracic spine sclerosis which are adjacent endplates and appear reactive. No acute finding. Review of the MIP images confirms the above findings. CTA ABDOMEN AND PELVIS FINDINGS VASCULAR Aorta: Normal caliber aorta without aneurysm, dissection, vasculitis or significant stenosis. Celiac: Patent without evidence of aneurysm, dissection, vasculitis or  significant stenosis. SMA: Patent without evidence of aneurysm, dissection, vasculitis or significant stenosis. Renals: Unremarkable IMA: Patent Inflow: Unremarkable Veins: Unremarkable in the venous phase Review of the MIP images confirms the above findings. NON-VASCULAR Hepatobiliary: No focal liver abnormality.Distended gallbladder and bile ducts. The common bile duct measures up to 1 cm in diameter. No visible choledocholithiasis. There is a calcified gallstone. Pancreas: Mild stranding around the pancreas, especially at the body and tail. No fluid collection or detectable necrosis. Spleen: Unremarkable. Adrenals/Urinary Tract:  Negative adrenals. No hydronephrosis or stone. Unremarkable bladder. Stomach/Bowel:  No obstruction. No appendicitis. Lymphatic: No mass or adenopathy. Reproductive:2.4 cm subserosal fibroid. Other: No ascites or pneumoperitoneum. Musculoskeletal: No acute abnormalities. Review of the MIP images confirms the above findings. IMPRESSION: Suspect mild acute edematous pancreatitis. There is cholelithiasis with biliary and gallbladder distension but no detected choledocholithiasis, consider MRCP. Normal CTA of the aorta. Electronically Signed   By: Monte Fantasia M.D.   On: 01/08/2021 07:28   US Abdomen Limited RUQ (LIVER/GB)  Result Date: 01/08/2021 CLINICAL DATA:  Right upper quadrant pain X 1 day. History of breast cancer. EXAM: ULTRASOUND ABDOMEN LIMITED RIGHT UPPER QUADRANT COMPARISON:  None. FINDINGS: Gallbladder: 8 mm calcified gallstone noted within the gallbladder lumen. No pericholecystic fluid or wall thickening visualized. No sonographic Murphy sign noted by sonographer. Intrahepatic biliary ducts are measuring at the upper limits of normal. Common bile duct: Diameter: Dilated measuring up to 10 mm. Liver: No focal lesion identified. Within normal limits in parenchymal echogenicity. Portal vein is patent on color Doppler imaging with normal direction of blood flow towards the  liver. Other: None. IMPRESSION: 1. Cholelithiasis with no findings of acute cholecystitis. 2. Enlarged common bile duct concerning for choledocholithiasis. Correlate with liver function tests, and if clinically indicated consider MRCP for further evaluation. Electronically Signed   By: Iven Finn M.D.   On: 01/08/2021 05:59      Assessment & Recommendations  55 year old female with left-sided breast cancer status post lung To me undergoing adjuvant chemotherapy presenting with biliary pancreatitis now status post laparoscopic cholecystectomy, POD #0, with positive intraoperative cholangiogram  1.  Partial biliary obstruction/positive IOC --she did very well with laparoscopic cholecystectomy but her intraoperative cholangiogram was positive for choledocholithiasis.  Her lab work is more indicative of biliary obstruction today than it was yesterday.  I recommended ERCP which will be performed tomorrow with general anesthesia and Dr. Fuller Plan.  We discussed the risk, benefits and alternatives today and she is agreeable and wishes to proceed.  We discussed the risks specifically that of pancreatitis which if occurring could be mild to severe.  We discussed that rarely severe pancreatitis can lead to considerable pain, prolonged hospital stay and in very rare cases even death. --N.p.o. after 3 AM --ERCP with Dr. Derrill Memo tomorrow around 1:30 PM --Plan periprocedural rectal indomethacin         LOS: 0 days   Lajuan Lines Celsey Asselin  01/09/2021, 5:44 PM [phone]

## 2021-01-09 NOTE — Anesthesia Preprocedure Evaluation (Addendum)
Anesthesia Evaluation  Patient identified by MRN, date of birth, ID band Patient awake    Reviewed: Allergy & Precautions, NPO status , Patient's Chart, lab work & pertinent test results  History of Anesthesia Complications Negative for: history of anesthetic complications  Airway Mallampati: II  TM Distance: >3 FB Neck ROM: Full    Dental  (+) Dental Advisory Given, Teeth Intact   Pulmonary neg pulmonary ROS,    Pulmonary exam normal        Cardiovascular hypertension, Normal cardiovascular exam   '22 TTE - EF 60 to 65%. Grade I diastolic dysfunction  (impaired relaxation). Left atrial size was mildly dilated. Mild mitral valve regurgitation.     Neuro/Psych PSYCHIATRIC DISORDERS Anxiety negative neurological ROS     GI/Hepatic GERD  Controlled and Medicated, Elevated LFTs    Endo/Other   Obesity   Renal/GU negative Renal ROS     Musculoskeletal negative musculoskeletal ROS (+)   Abdominal   Peds  Hematology  (+) anemia ,   Anesthesia Other Findings Covid test negative   Reproductive/Obstetrics  Breast cancer                             Anesthesia Physical Anesthesia Plan  ASA: II  Anesthesia Plan: General   Post-op Pain Management:    Induction: Intravenous  PONV Risk Score and Plan: 4 or greater and Treatment may vary due to age or medical condition, Ondansetron, Midazolam, Dexamethasone and Scopolamine patch - Pre-op  Airway Management Planned: Oral ETT  Additional Equipment: None  Intra-op Plan:   Post-operative Plan: Extubation in OR  Informed Consent: I have reviewed the patients History and Physical, chart, labs and discussed the procedure including the risks, benefits and alternatives for the proposed anesthesia with the patient or authorized representative who has indicated his/her understanding and acceptance.     Dental advisory given  Plan  Discussed with: CRNA and Anesthesiologist  Anesthesia Plan Comments:        Anesthesia Quick Evaluation

## 2021-01-09 NOTE — Progress Notes (Signed)
Progress Note   Subjective  Patient feeling well after laparoscopic cholecystectomy No abdominal pain No nausea Husband at bedside   Objective  Vital signs in last 24 hours: Temp:  [97.4 F (36.3 C)-98.9 F (37.2 C)] 97.8 F (36.6 C) (02/15 1741) Pulse Rate:  [81-103] 96 (02/15 1741) Resp:  [8-26] 18 (02/15 1741) BP: (135-154)/(80-102) 146/90 (02/15 1741) SpO2:  [97 %-100 %] 98 % (02/15 1741) Last BM Date: 01/07/21  Gen: awake, alert, NAD HEENT: Mildly icteric sclera CV: RRR, no mrg Pulm: CTA b/l Abd: soft, NT/ND, +BS throughout Ext: no c/c/e Neuro: nonfocal   Intake/Output from previous day: 02/14 0701 - 02/15 0700 In: 488.1 [P.O.:240; I.V.:198.2; IV Piggyback:49.9] Out: 2400 [Urine:2400] Intake/Output this shift: Total I/O In: 1293.2 [I.V.:1200; IV Piggyback:93.2] Out: 510 [Urine:500; Blood:10]  Lab Results: Recent Labs    01/08/21 0535 01/09/21 0313  WBC 5.8 3.9*  HGB 10.9* 10.0*  HCT 33.4* 30.8*  PLT 330 318   BMET Recent Labs    01/08/21 0535 01/09/21 0313  NA 136 138  K 3.8 3.6  CL 102 103  CO2 22 25  GLUCOSE 191* 112*  BUN 15 9  CREATININE 0.62 0.66  CALCIUM 9.7 9.1   LFT Recent Labs    01/09/21 0313  PROT 6.2*  ALBUMIN 3.3*  AST 142*  ALT 157*  ALKPHOS 83  BILITOT 3.1*   PT/INR No results for input(s): LABPROT, INR in the last 72 hours. Hepatitis Panel No results for input(s): HEPBSAG, HCVAB, HEPAIGM, HEPBIGM in the last 72 hours.  Studies/Results: DG Cholangiogram Operative  Result Date: 01/09/2021 CLINICAL DATA:  55 year old female with history of cholelithiasis. EXAM: INTRAOPERATIVE CHOLANGIOGRAM COMPARISON:  MRCP from 01/08/2021 FLUOROSCOPY TIME:  Fluoroscopy Time:  20 seconds Number of Acquired Spot Images: 0 FINDINGS: Intraoperative cholangiogram with injection of the remnant cystic duct performed after cholecystectomy demonstrates mid common bile duct medial entry of the cystic duct. There is trifurcation of the  biliary confluence (Blumgart type B). There is mild central intrahepatic and extrahepatic biliary ductal dilation. There are 2 partially mobile punctate filling defects in the distal common bile duct. There is minimal contrast flowing into the duodenum. IMPRESSION: Distal common bile duct choledocholithiasis, partially obstructive. Ruthann Cancer, MD [facility] Electronically Signed   By: Ruthann Cancer MD   On: 01/09/2021 14:11   MR 3D Recon At Scanner  Result Date: 01/08/2021 CLINICAL DATA:  Right upper quadrant pain. Jaundice. Breast cancer. Evaluate possible pancreatitis. EXAM: MRI ABDOMEN WITHOUT AND WITH CONTRAST (INCLUDING MRCP) TECHNIQUE: Multiplanar multisequence MR imaging of the abdomen was performed both before and after the administration of intravenous contrast. Heavily T2-weighted images of the biliary and pancreatic ducts were obtained, and three-dimensional MRCP images were rendered by post processing. CONTRAST:  27mL GADAVIST GADOBUTROL 1 MMOL/ML IV SOLN COMPARISON:  9 cc Gadavist abdominal ultrasound of earlier today. CTA of the chest, abdomen, and pelvis of earlier today. FINDINGS: Lower chest: Normal heart size without pericardial or pleural effusion. Hepatobiliary: No focal liver lesion. Moderate intrahepatic biliary duct dilatation. Multiple tiny gallstones without specific evidence of acute cholecystitis. Moderate common duct dilatation, including at 1.0 cm on 15/12. The common duct tapers gradually, without evidence of obstructive stone or mass. Pancreas: Mild peripancreatic edema, including on 21/4. No evidence of pancreatic necrosis, duct dilatation, or well-circumscribed peripancreatic fluid collection. Spleen:  Normal in size, without focal abnormality. Adrenals/Urinary Tract: Normal adrenal glands. Normal kidneys, without hydronephrosis. Stomach/Bowel: Tiny hiatal hernia.  Normal abdominal bowel loops. Vascular/Lymphatic:  Aortic atherosclerosis. Retroaortic left renal vein. No retroperitoneal or retrocrural adenopathy. Other:  Trace perihepatic ascites. Musculoskeletal: No acute osseous abnormality. Minimal convex left lumbar spine curvature. IMPRESSION: 1. Cholelithiasis. Moderate intra and extrahepatic biliary duct dilatation, without obstructive stone or mass. Favor recent stone passage. Consider surveillance of bilirubin. If persistently elevated, ERCP should be considered to exclude less likely occult ampullary stenosis. 2. Non complicated mild pancreatitis. 3.  Aortic Atherosclerosis (ICD10-I70.0). 4.  Tiny hiatal hernia. 5. Trace perihepatic ascites. Electronically Signed   By: Abigail Miyamoto M.D.   On: 01/08/2021 12:59   DG Chest Portable 1 View  Result Date: 01/08/2021 CLINICAL DATA:  Chest pain, vomiting, upper back EXAM: PORTABLE CHEST 1 VIEW COMPARISON:  Chest x-ray 09/12/2020. FINDINGS: Right chest wall Port-A-Cath with tip overlying the expected region of the superior cavoatrial junction. The heart size and mediastinal contours are within normal limits. No focal consolidation. No pulmonary edema. No pleural effusion. No pneumothorax. No acute osseous abnormality. Partially visualized foci of gas within the right upper quadrant of unclear etiology. IMPRESSION: 1. No active disease. 2. Partially visualized foci of gas within the right upper quadrant of unclear etiology. Finding likely represents gas within the large bowel. Recommend x-ray abdomen for further evaluation. Electronically Signed   By: Iven Finn M.D.   On: 01/08/2021 05:56   MR ABDOMEN MRCP W WO CONTAST  Result Date: 01/08/2021 CLINICAL DATA:  Right upper quadrant pain. Jaundice. Breast cancer. Evaluate possible pancreatitis. EXAM: MRI ABDOMEN WITHOUT AND WITH CONTRAST (INCLUDING MRCP) TECHNIQUE: Multiplanar multisequence MR imaging of the abdomen was performed both before and after the administration of intravenous contrast. Heavily T2-weighted  images of the biliary and pancreatic ducts were obtained, and three-dimensional MRCP images were rendered by post processing. CONTRAST:  92mL GADAVIST GADOBUTROL 1 MMOL/ML IV SOLN COMPARISON:  9 cc Gadavist abdominal ultrasound of earlier today. CTA of the chest, abdomen, and pelvis of earlier today. FINDINGS: Lower chest: Normal heart size without pericardial or pleural effusion. Hepatobiliary: No focal liver lesion. Moderate intrahepatic biliary duct dilatation. Multiple tiny gallstones without specific evidence of acute cholecystitis. Moderate common duct dilatation, including at 1.0 cm on 15/12. The common duct tapers gradually, without evidence of obstructive stone or mass. Pancreas: Mild peripancreatic edema, including on 21/4. No evidence of pancreatic necrosis, duct dilatation, or well-circumscribed peripancreatic fluid collection. Spleen:  Normal in size, without focal abnormality. Adrenals/Urinary Tract: Normal adrenal glands. Normal kidneys, without hydronephrosis. Stomach/Bowel: Tiny hiatal hernia.  Normal abdominal bowel loops. Vascular/Lymphatic: Aortic atherosclerosis. Retroaortic left renal vein. No retroperitoneal or retrocrural adenopathy. Other:  Trace perihepatic ascites. Musculoskeletal: No acute osseous abnormality. Minimal convex left lumbar spine curvature. IMPRESSION: 1. Cholelithiasis. Moderate intra and extrahepatic biliary duct dilatation, without obstructive stone or mass. Favor recent stone passage. Consider surveillance of bilirubin. If persistently elevated, ERCP should be considered to exclude less likely occult ampullary stenosis. 2. Non complicated mild pancreatitis. 3.  Aortic Atherosclerosis (ICD10-I70.0). 4.  Tiny hiatal hernia. 5. Trace perihepatic ascites. Electronically Signed   By: Abigail Miyamoto M.D.   On: 01/08/2021 12:59   ECHOCARDIOGRAM COMPLETE  Result Date: 01/08/2021    ECHOCARDIOGRAM REPORT   Patient Name:   ARMINE RIZZOLO Hilliker Date of Exam: 01/08/2021 Medical Rec #:   [id number]       Height:       66.0 in Accession #:    [id number]      Weight:       220.0 lb Date of  Birth:  1966/01/06       BSA:          2.082 m Patient Age:    72 years        BP:           152/89 mmHg Patient Gender: F               HR:           99 bpm. Exam Location:  Inpatient Procedure: 2D Echo, Cardiac Doppler, Color Doppler, 3D Echo and Strain Analysis Indications:     Z51.11 Encounter for antineoplastic chemotheraphy  History:         Patient has prior history of Echocardiogram examinations, most                  recent 09/08/2020. Risk Factors:Hypertension and GERD. Breast                  Cancer.  Sonographer:     Tiffany Dance Referring Phys:  [place] Diagnosing Phys: Dixie Dials MD IMPRESSIONS  1. GLS Endo Peak A4C: -17 %, A2C is -14.6 % and A3C is -17.7 %.. Left ventricular ejection fraction, by estimation, is 60 to 65%. The left ventricle has normal function. The left ventricle has no regional wall motion abnormalities. Left ventricular diastolic parameters are consistent with Grade I diastolic dysfunction (impaired relaxation).  2. Right ventricular systolic function is normal. The right ventricular size is normal.  3. Left atrial size was mildly dilated.  4. The mitral valve is degenerative. Mild mitral valve regurgitation.  5. The aortic valve is tricuspid. There is mild calcification of the aortic valve. There is mild thickening of the aortic valve. Aortic valve regurgitation is not visualized.  6. The inferior vena cava is normal in size with greater than 50% respiratory variability, suggesting right atrial pressure of 3 mmHg. Comparison(s): No significant change from prior study. FINDINGS  Left Ventricle: GLS Endo Peak A4C: -17 %, A2C is -14.6 % and A3C is -17.7 %. Left ventricular ejection fraction, by estimation, is 60 to 65%. The left ventricle has normal function. The left ventricle has no regional wall motion abnormalities. The left ventricular internal cavity size was normal  in size. There is no left ventricular hypertrophy. Left ventricular diastolic parameters are consistent with Grade I diastolic dysfunction (impaired relaxation). Right Ventricle: The right ventricular size is normal. No increase in right ventricular wall thickness. Right ventricular systolic function is normal. Left Atrium: Left atrial size was mildly dilated. Right Atrium: Right atrial size was normal in size. Pericardium: There is no evidence of pericardial effusion. Mitral Valve: The mitral valve is degenerative in appearance. There is mild calcification of the mitral valve leaflet(s). Mild mitral annular calcification. Mild mitral valve regurgitation. Tricuspid Valve: The tricuspid valve is normal in structure. Tricuspid valve regurgitation is mild. Aortic Valve: The aortic valve is tricuspid. There is mild calcification of the aortic valve. There is mild thickening of the aortic valve. Aortic valve regurgitation is not visualized. Pulmonic Valve: The pulmonic valve was normal in structure. Pulmonic valve regurgitation is not visualized. Aorta: The aortic root is normal in size and structure. Venous: The inferior vena cava is normal in size with greater than 50% respiratory variability, suggesting right atrial pressure of 3 mmHg. IAS/Shunts: The interatrial septum was not well visualized.  LEFT VENTRICLE PLAX 2D LVIDd:         4.70 cm  Diastology LVIDs:  3.50 cm  LV e' medial:    7.72 cm/s LV PW:         1.50 cm  LV E/e' medial:  10.5 LV IVS:        1.00 cm  LV e' lateral:   7.40 cm/s LVOT diam:     1.90 cm  LV E/e' lateral: 11.0 LV SV:         56 LV SV Index:   27 LVOT Area:     2.84 cm  RIGHT VENTRICLE             IVC RV Basal diam:  2.50 cm     IVC diam: 1.40 cm RV S prime:     15.10 cm/s TAPSE (M-mode): 2.5 cm LEFT ATRIUM             Index       RIGHT ATRIUM           Index LA diam:        4.30 cm 2.06 cm/m  RA Area:     10.50 cm LA Vol (A2C):   63.7 ml 30.59 ml/m RA Volume:   22.90 ml  11.00  ml/m LA Vol (A4C):   43.1 ml 20.70 ml/m LA Biplane Vol: 53.6 ml 25.74 ml/m  AORTIC VALVE LVOT Vmax:   101.00 cm/s LVOT Vmean:  63.800 cm/s LVOT VTI:    0.197 m  AORTA Ao Root diam: 3.20 cm Ao Asc diam:  3.30 cm MITRAL VALVE MV Area (PHT): 3.37 cm    SHUNTS MV Decel Time: 225 msec    Systemic VTI:  0.20 m MV E velocity: 81.20 cm/s  Systemic Diam: 1.90 cm MV A velocity: 88.60 cm/s MV E/A ratio:  0.92 Dixie Dials MD Electronically signed by Dixie Dials MD Signature Date/Time: 01/08/2021/6:20:56 PM    Final    CT Angio Chest/Abd/Pel for Dissection W and/or Wo Contrast  Result Date: 01/08/2021 CLINICAL DATA:  Left-sided breast cancer undergoing chemotherapy. Vomiting and upper back pain EXAM: CT ANGIOGRAPHY CHEST, ABDOMEN AND PELVIS TECHNIQUE: Non-contrast CT of the chest was initially obtained. Multidetector CT imaging through the chest, abdomen and pelvis was performed using the standard protocol during bolus administration of intravenous contrast. Multiplanar reconstructed images and MIPs were obtained and reviewed to evaluate the vascular anatomy. CONTRAST:  168mL OMNIPAQUE IOHEXOL 350 MG/ML SOLN COMPARISON:  None. FINDINGS: CTA CHEST FINDINGS Cardiovascular: Normal heart size. No pericardial effusion. No aortic dissection, aneurysm, or intramural hematoma. Porta catheter on the right with tip in good position at the upper cavoatrial junction. Mediastinum/Nodes: Negative for adenopathy or mass. Postoperative left breast. Lungs/Pleura: The central airways are clear. There is no edema, consolidation, effusion, or pneumothorax. Musculoskeletal: Patchy areas of thoracic spine sclerosis which are adjacent endplates and appear reactive. No acute finding. Review of the MIP images confirms the above findings. CTA ABDOMEN AND PELVIS FINDINGS VASCULAR Aorta: Normal caliber aorta without aneurysm, dissection, vasculitis or significant stenosis. Celiac: Patent without evidence of aneurysm, dissection, vasculitis or  significant stenosis. SMA: Patent without evidence of aneurysm, dissection, vasculitis or significant stenosis. Renals: Unremarkable IMA: Patent Inflow: Unremarkable Veins: Unremarkable in the venous phase Review of the MIP images confirms the above findings. NON-VASCULAR Hepatobiliary: No focal liver abnormality.Distended gallbladder and bile ducts. The common bile duct measures up to 1 cm in diameter. No visible choledocholithiasis. There is a calcified gallstone. Pancreas: Mild stranding around the pancreas, especially at the body and tail. No fluid collection or detectable necrosis. Spleen: Unremarkable. Adrenals/Urinary Tract:  Negative adrenals. No hydronephrosis or stone. Unremarkable bladder. Stomach/Bowel:  No obstruction. No appendicitis. Lymphatic: No mass or adenopathy. Reproductive:2.4 cm subserosal fibroid. Other: No ascites or pneumoperitoneum. Musculoskeletal: No acute abnormalities. Review of the MIP images confirms the above findings. IMPRESSION: Suspect mild acute edematous pancreatitis. There is cholelithiasis with biliary and gallbladder distension but no detected choledocholithiasis, consider MRCP. Normal CTA of the aorta. Electronically Signed   By: Monte Fantasia M.D.   On: 01/08/2021 07:28   US Abdomen Limited RUQ (LIVER/GB)  Result Date: 01/08/2021 CLINICAL DATA:  Right upper quadrant pain X 1 day. History of breast cancer. EXAM: ULTRASOUND ABDOMEN LIMITED RIGHT UPPER QUADRANT COMPARISON:  None. FINDINGS: Gallbladder: 8 mm calcified gallstone noted within the gallbladder lumen. No pericholecystic fluid or wall thickening visualized. No sonographic Murphy sign noted by sonographer. Intrahepatic biliary ducts are measuring at the upper limits of normal. Common bile duct: Diameter: Dilated measuring up to 10 mm. Liver: No focal lesion identified. Within normal limits in parenchymal echogenicity. Portal vein is patent on color Doppler imaging with normal direction of blood flow towards the  liver. Other: None. IMPRESSION: 1. Cholelithiasis with no findings of acute cholecystitis. 2. Enlarged common bile duct concerning for choledocholithiasis. Correlate with liver function tests, and if clinically indicated consider MRCP for further evaluation. Electronically Signed   By: Iven Finn M.D.   On: 01/08/2021 05:59      Assessment & Recommendations  55 year old female with left-sided breast cancer status post lung To me undergoing adjuvant chemotherapy presenting with biliary pancreatitis now status post laparoscopic cholecystectomy, POD #0, with positive intraoperative cholangiogram  1.  Partial biliary obstruction/positive IOC --she did very well with laparoscopic cholecystectomy but her intraoperative cholangiogram was positive for choledocholithiasis.  Her lab work is more indicative of biliary obstruction today than it was yesterday.  I recommended ERCP which will be performed tomorrow with general anesthesia and Dr. Fuller Plan.  We discussed the risk, benefits and alternatives today and she is agreeable and wishes to proceed.  We discussed the risks specifically that of pancreatitis which if occurring could be mild to severe.  We discussed that rarely severe pancreatitis can lead to considerable pain, prolonged hospital stay and in very rare cases even death. --N.p.o. after 3 AM --ERCP with Dr. Derrill Memo tomorrow around 1:30 PM --Plan periprocedural rectal indomethacin         LOS: 0 days   Lajuan Lines Randall Colden  01/09/2021, 5:44 PM [phone]

## 2021-01-09 NOTE — Progress Notes (Signed)
Just notified by Surgery that patient has positive IOC. She is in OR recovery right now.  I have scheduled her for ERCP tomorrow at 1:30 pm. Yesterday we reviewed the procedure including risks and benefits and she agreed to proceed

## 2021-01-09 NOTE — Progress Notes (Signed)
Subjective/Chief Complaint: Pain much improved No nausea T. Bili increased today to 3.1 despite negative MRCP Lipase normal ECHO - normal cardiac function  Objective: Vital signs in last 24 hours: Temp:  [97.6 F (36.4 C)-98.9 F (37.2 C)] 98.4 F (36.9 C) (02/15 0626) Pulse Rate:  [87-96] 91 (02/15 0626) Resp:  [16-18] 16 (02/15 0626) BP: (139-160)/(86-97) 145/94 (02/15 0626) SpO2:  [97 %-100 %] 97 % (02/15 0626) Last BM Date: 01/07/21  Intake/Output from previous day: 02/14 0701 - 02/15 0700 In: 488.1 [P.O.:240; I.V.:198.2; IV Piggyback:49.9] Out: 2400 [Urine:2400] Intake/Output this shift: No intake/output data recorded.  WDWN in NAD Right chest port - accessed Mild jaundice Abd - soft, minimal RUQ tenderness  Lab Results:  Recent Labs    01/08/21 0535 01/09/21 0313  WBC 5.8 3.9*  HGB 10.9* 10.0*  HCT 33.4* 30.8*  PLT 330 318   BMET Recent Labs    01/08/21 0535 01/09/21 0313  NA 136 138  K 3.8 3.6  CL 102 103  CO2 22 25  GLUCOSE 191* 112*  BUN 15 9  CREATININE 0.62 0.66  CALCIUM 9.7 9.1   Hepatic Function Latest Ref Rng & Units 01/09/2021 01/08/2021 01/01/2021  Total Protein 6.5 - 8.1 g/dL 6.2(L) 7.2 6.7  Albumin 3.5 - 5.0 g/dL 3.3(L) 4.1 3.6  AST 15 - 41 U/L 142(H) 104(H) 29  ALT 0 - 44 U/L 157(H) 94(H) 47(H)  Alk Phosphatase 38 - 126 U/L 83 74 60  Total Bilirubin 0.3 - 1.2 mg/dL 3.1(H) 2.0(H) 0.6  Bilirubin, Direct 0.00 - 0.40 mg/dL - - -    PT/INR No results for input(s): LABPROT, INR in the last 72 hours. ABG No results for input(s): PHART, HCO3 in the last 72 hours.  Invalid input(s): PCO2, PO2  Studies/Results: MR 3D Recon At Scanner  Result Date: 01/08/2021 CLINICAL DATA:  Right upper quadrant pain. Jaundice. Breast cancer. Evaluate possible pancreatitis. EXAM: MRI ABDOMEN WITHOUT AND WITH CONTRAST (INCLUDING MRCP) TECHNIQUE: Multiplanar multisequence MR imaging of the abdomen was performed both before and after the administration  of intravenous contrast. Heavily T2-weighted images of the biliary and pancreatic ducts were obtained, and three-dimensional MRCP images were rendered by post processing. CONTRAST:  82m GADAVIST GADOBUTROL 1 MMOL/ML IV SOLN COMPARISON:  9 cc Gadavist abdominal ultrasound of earlier today. CTA of the chest, abdomen, and pelvis of earlier today. FINDINGS: Lower chest: Normal heart size without pericardial or pleural effusion. Hepatobiliary: No focal liver lesion. Moderate intrahepatic biliary duct dilatation. Multiple tiny gallstones without specific evidence of acute cholecystitis. Moderate common duct dilatation, including at 1.0 cm on 15/12. The common duct tapers gradually, without evidence of obstructive stone or mass. Pancreas: Mild peripancreatic edema, including on 21/4. No evidence of pancreatic necrosis, duct dilatation, or well-circumscribed peripancreatic fluid collection. Spleen:  Normal in size, without focal abnormality. Adrenals/Urinary Tract: Normal adrenal glands. Normal kidneys, without hydronephrosis. Stomach/Bowel: Tiny hiatal hernia.  Normal abdominal bowel loops. Vascular/Lymphatic: Aortic atherosclerosis. Retroaortic left renal vein. No retroperitoneal or retrocrural adenopathy. Other:  Trace perihepatic ascites. Musculoskeletal: No acute osseous abnormality. Minimal convex left lumbar spine curvature. IMPRESSION: 1. Cholelithiasis. Moderate intra and extrahepatic biliary duct dilatation, without obstructive stone or mass. Favor recent stone passage. Consider surveillance of bilirubin. If persistently elevated, ERCP should be considered to exclude less likely occult ampullary stenosis. 2. Non complicated mild pancreatitis. 3.  Aortic Atherosclerosis (ICD10-I70.0). 4.  Tiny hiatal hernia. 5. Trace perihepatic ascites. Electronically Signed   By: KAbigail MiyamotoM.D.   On:  01/08/2021 12:59   DG Chest Portable 1 View  Result Date: 01/08/2021 CLINICAL DATA:  Chest pain, vomiting, upper back EXAM:  PORTABLE CHEST 1 VIEW COMPARISON:  Chest x-ray 09/12/2020. FINDINGS: Right chest wall Port-A-Cath with tip overlying the expected region of the superior cavoatrial junction. The heart size and mediastinal contours are within normal limits. No focal consolidation. No pulmonary edema. No pleural effusion. No pneumothorax. No acute osseous abnormality. Partially visualized foci of gas within the right upper quadrant of unclear etiology. IMPRESSION: 1. No active disease. 2. Partially visualized foci of gas within the right upper quadrant of unclear etiology. Finding likely represents gas within the large bowel. Recommend x-ray abdomen for further evaluation. Electronically Signed   By: Iven Finn M.D.   On: 01/08/2021 05:56   MR ABDOMEN MRCP W WO CONTAST  Result Date: 01/08/2021 CLINICAL DATA:  Right upper quadrant pain. Jaundice. Breast cancer. Evaluate possible pancreatitis. EXAM: MRI ABDOMEN WITHOUT AND WITH CONTRAST (INCLUDING MRCP) TECHNIQUE: Multiplanar multisequence MR imaging of the abdomen was performed both before and after the administration of intravenous contrast. Heavily T2-weighted images of the biliary and pancreatic ducts were obtained, and three-dimensional MRCP images were rendered by post processing. CONTRAST:  73m GADAVIST GADOBUTROL 1 MMOL/ML IV SOLN COMPARISON:  9 cc Gadavist abdominal ultrasound of earlier today. CTA of the chest, abdomen, and pelvis of earlier today. FINDINGS: Lower chest: Normal heart size without pericardial or pleural effusion. Hepatobiliary: No focal liver lesion. Moderate intrahepatic biliary duct dilatation. Multiple tiny gallstones without specific evidence of acute cholecystitis. Moderate common duct dilatation, including at 1.0 cm on 15/12. The common duct tapers gradually, without evidence of obstructive stone or mass. Pancreas: Mild peripancreatic edema, including on 21/4. No evidence of pancreatic necrosis, duct dilatation, or well-circumscribed  peripancreatic fluid collection. Spleen:  Normal in size, without focal abnormality. Adrenals/Urinary Tract: Normal adrenal glands. Normal kidneys, without hydronephrosis. Stomach/Bowel: Tiny hiatal hernia.  Normal abdominal bowel loops. Vascular/Lymphatic: Aortic atherosclerosis. Retroaortic left renal vein. No retroperitoneal or retrocrural adenopathy. Other:  Trace perihepatic ascites. Musculoskeletal: No acute osseous abnormality. Minimal convex left lumbar spine curvature. IMPRESSION: 1. Cholelithiasis. Moderate intra and extrahepatic biliary duct dilatation, without obstructive stone or mass. Favor recent stone passage. Consider surveillance of bilirubin. If persistently elevated, ERCP should be considered to exclude less likely occult ampullary stenosis. 2. Non complicated mild pancreatitis. 3.  Aortic Atherosclerosis (ICD10-I70.0). 4.  Tiny hiatal hernia. 5. Trace perihepatic ascites. Electronically Signed   By: KAbigail MiyamotoM.D.   On: 01/08/2021 12:59   ECHOCARDIOGRAM COMPLETE  Result Date: 01/08/2021    ECHOCARDIOGRAM REPORT   Patient Name:   Lindsay Hampton Date of Exam: 01/08/2021 Medical Rec #:  [id number]      Height:       66.0 in Accession #:    [id number]     Weight:       220.0 lb Date of Birth:  7March 27, 1967      BSA:          2.082 m Patient Age:    89years        BP:           152/89 mmHg Patient Gender: F               HR:           99 bpm. Exam Location:  Inpatient Procedure: 2D Echo, Cardiac Doppler, Color Doppler, 3D Echo and Strain Analysis Indications:     Z51.11 Encounter  for antineoplastic chemotheraphy  History:         Patient has prior history of Echocardiogram examinations, most                  recent 09/08/2020. Risk Factors:Hypertension and GERD. Breast                  Cancer.  Sonographer:     Tiffany Dance Referring Phys:  Darling Diagnosing Phys: Dixie Dials MD IMPRESSIONS  1. GLS Endo Peak A4C: -17 %, A2C is -14.6 % and A3C is -17.7 %.. Left ventricular  ejection fraction, by estimation, is 60 to 65%. The left ventricle has normal function. The left ventricle has no regional wall motion abnormalities. Left ventricular diastolic parameters are consistent with Grade I diastolic dysfunction (impaired relaxation).  2. Right ventricular systolic function is normal. The right ventricular size is normal.  3. Left atrial size was mildly dilated.  4. The mitral valve is degenerative. Mild mitral valve regurgitation.  5. The aortic valve is tricuspid. There is mild calcification of the aortic valve. There is mild thickening of the aortic valve. Aortic valve regurgitation is not visualized.  6. The inferior vena cava is normal in size with greater than 50% respiratory variability, suggesting right atrial pressure of 3 mmHg. Comparison(s): No significant change from prior study. FINDINGS  Left Ventricle: GLS Endo Peak A4C: -17 %, A2C is -14.6 % and A3C is -17.7 %. Left ventricular ejection fraction, by estimation, is 60 to 65%. The left ventricle has normal function. The left ventricle has no regional wall motion abnormalities. The left ventricular internal cavity size was normal in size. There is no left ventricular hypertrophy. Left ventricular diastolic parameters are consistent with Grade I diastolic dysfunction (impaired relaxation). Right Ventricle: The right ventricular size is normal. No increase in right ventricular wall thickness. Right ventricular systolic function is normal. Left Atrium: Left atrial size was mildly dilated. Right Atrium: Right atrial size was normal in size. Pericardium: There is no evidence of pericardial effusion. Mitral Valve: The mitral valve is degenerative in appearance. There is mild calcification of the mitral valve leaflet(s). Mild mitral annular calcification. Mild mitral valve regurgitation. Tricuspid Valve: The tricuspid valve is normal in structure. Tricuspid valve regurgitation is mild. Aortic Valve: The aortic valve is tricuspid.  There is mild calcification of the aortic valve. There is mild thickening of the aortic valve. Aortic valve regurgitation is not visualized. Pulmonic Valve: The pulmonic valve was normal in structure. Pulmonic valve regurgitation is not visualized. Aorta: The aortic root is normal in size and structure. Venous: The inferior vena cava is normal in size with greater than 50% respiratory variability, suggesting right atrial pressure of 3 mmHg. IAS/Shunts: The interatrial septum was not well visualized.  LEFT VENTRICLE PLAX 2D LVIDd:         4.70 cm  Diastology LVIDs:         3.50 cm  LV e' medial:    7.72 cm/s LV PW:         1.50 cm  LV E/e' medial:  10.5 LV IVS:        1.00 cm  LV e' lateral:   7.40 cm/s LVOT diam:     1.90 cm  LV E/e' lateral: 11.0 LV SV:         56 LV SV Index:   27 LVOT Area:     2.84 cm  RIGHT VENTRICLE  IVC RV Basal diam:  2.50 cm     IVC diam: 1.40 cm RV S prime:     15.10 cm/s TAPSE (M-mode): 2.5 cm LEFT ATRIUM             Index       RIGHT ATRIUM           Index LA diam:        4.30 cm 2.06 cm/m  RA Area:     10.50 cm LA Vol (A2C):   63.7 ml 30.59 ml/m RA Volume:   22.90 ml  11.00 ml/m LA Vol (A4C):   43.1 ml 20.70 ml/m LA Biplane Vol: 53.6 ml 25.74 ml/m  AORTIC VALVE LVOT Vmax:   101.00 cm/s LVOT Vmean:  63.800 cm/s LVOT VTI:    0.197 m  AORTA Ao Root diam: 3.20 cm Ao Asc diam:  3.30 cm MITRAL VALVE MV Area (PHT): 3.37 cm    SHUNTS MV Decel Time: 225 msec    Systemic VTI:  0.20 m MV E velocity: 81.20 cm/s  Systemic Diam: 1.90 cm MV A velocity: 88.60 cm/s MV E/A ratio:  0.92 Dixie Dials MD Electronically signed by Dixie Dials MD Signature Date/Time: 01/08/2021/6:20:56 PM    Final    CT Angio Chest/Abd/Pel for Dissection W and/or Wo Contrast  Result Date: 01/08/2021 CLINICAL DATA:  Left-sided breast cancer undergoing chemotherapy. Vomiting and upper back pain EXAM: CT ANGIOGRAPHY CHEST, ABDOMEN AND PELVIS TECHNIQUE: Non-contrast CT of the chest was initially obtained.  Multidetector CT imaging through the chest, abdomen and pelvis was performed using the standard protocol during bolus administration of intravenous contrast. Multiplanar reconstructed images and MIPs were obtained and reviewed to evaluate the vascular anatomy. CONTRAST:  120m OMNIPAQUE IOHEXOL 350 MG/ML SOLN COMPARISON:  None. FINDINGS: CTA CHEST FINDINGS Cardiovascular: Normal heart size. No pericardial effusion. No aortic dissection, aneurysm, or intramural hematoma. Porta catheter on the right with tip in good position at the upper cavoatrial junction. Mediastinum/Nodes: Negative for adenopathy or mass. Postoperative left breast. Lungs/Pleura: The central airways are clear. There is no edema, consolidation, effusion, or pneumothorax. Musculoskeletal: Patchy areas of thoracic spine sclerosis which are adjacent endplates and appear reactive. No acute finding. Review of the MIP images confirms the above findings. CTA ABDOMEN AND PELVIS FINDINGS VASCULAR Aorta: Normal caliber aorta without aneurysm, dissection, vasculitis or significant stenosis. Celiac: Patent without evidence of aneurysm, dissection, vasculitis or significant stenosis. SMA: Patent without evidence of aneurysm, dissection, vasculitis or significant stenosis. Renals: Unremarkable IMA: Patent Inflow: Unremarkable Veins: Unremarkable in the venous phase Review of the MIP images confirms the above findings. NON-VASCULAR Hepatobiliary: No focal liver abnormality.Distended gallbladder and bile ducts. The common bile duct measures up to 1 cm in diameter. No visible choledocholithiasis. There is a calcified gallstone. Pancreas: Mild stranding around the pancreas, especially at the body and tail. No fluid collection or detectable necrosis. Spleen: Unremarkable. Adrenals/Urinary Tract: Negative adrenals. No hydronephrosis or stone. Unremarkable bladder. Stomach/Bowel:  No obstruction. No appendicitis. Lymphatic: No mass or adenopathy. Reproductive:2.4 cm  subserosal fibroid. Other: No ascites or pneumoperitoneum. Musculoskeletal: No acute abnormalities. Review of the MIP images confirms the above findings. IMPRESSION: Suspect mild acute edematous pancreatitis. There is cholelithiasis with biliary and gallbladder distension but no detected choledocholithiasis, consider MRCP. Normal CTA of the aorta. Electronically Signed   By: JMonte FantasiaM.D.   On: 01/08/2021 07:28   UKoreaAbdomen Limited RUQ (LIVER/GB)  Result Date: 01/08/2021 CLINICAL DATA:  Right upper quadrant pain X 1 day.  History of breast cancer. EXAM: ULTRASOUND ABDOMEN LIMITED RIGHT UPPER QUADRANT COMPARISON:  None. FINDINGS: Gallbladder: 8 mm calcified gallstone noted within the gallbladder lumen. No pericholecystic fluid or wall thickening visualized. No sonographic Murphy sign noted by sonographer. Intrahepatic biliary ducts are measuring at the upper limits of normal. Common bile duct: Diameter: Dilated measuring up to 10 mm. Liver: No focal lesion identified. Within normal limits in parenchymal echogenicity. Portal vein is patent on color Doppler imaging with normal direction of blood flow towards the liver. Other: None. IMPRESSION: 1. Cholelithiasis with no findings of acute cholecystitis. 2. Enlarged common bile duct concerning for choledocholithiasis. Correlate with liver function tests, and if clinically indicated consider MRCP for further evaluation. Electronically Signed   By: Iven Finn M.D.   On: 01/08/2021 05:59    Anti-infectives: Anti-infectives (From admission, onward)   Start     Dose/Rate Route Frequency Ordered Stop   01/08/21 1400  piperacillin-tazobactam (ZOSYN) IVPB 3.375 g        3.375 g 12.5 mL/hr over 240 Minutes Intravenous Every 8 hours 01/08/21 0844     01/08/21 0745  piperacillin-tazobactam (ZOSYN) IVPB 3.375 g        3.375 g 100 mL/hr over 30 Minutes Intravenous  Once 01/08/21 0732 01/08/21 0813      Assessment/Plan: Left breast cancer S/p  lumpectomy/chemotherapy Adriamycin/Cytoxan  Abdominal pain/cholelithiasis/CBD 10 mm MRCP 2/14: Multiple tiny gallstones without evidence of cholecystitis/moderate common bile duct dilatation of 10 mm, no evidence of obstructive stone or mass.  Mild peripancreatic edema no evidence of necrosis ductal dilatation or well-circumscribed peripancreatic fluid collection.  FEN:  NPO for surgery/IV fluids ID: Zosyn 2/14 >> DVT: heparin   Plan:  Negative MRCP, but T. Bili increased to 3.1.  This could be from edema/ irritation of the ampulla from passing a CBD stone.  Will proceed with lap chole with IOC today.  If IOC is negative, advance diet and probable discharge tomorrow pending repeat LFT's.  If IOC is positive, she will likely need ERCP/ stent.  I have communicated with her Oncologist about rescheduling her next chemo treatment.  Imogene Burn. [name], MD, [facility]  General/ Trauma Surgery   01/09/2021 8:53 AM  LOS: 0 days

## 2021-01-09 NOTE — Discharge Instructions (Signed)
[facility] ° °Please arrive at least 30 min before your appointment to complete your check in paperwork.  If you are unable to arrive 30 min prior to your appointment time we may have to cancel or reschedule you. °LAPAROSCOPIC SURGERY: POST OP INSTRUCTIONS °Always review your discharge instruction sheet given to you by the facility where your surgery was performed. °IF YOU HAVE DISABILITY OR FAMILY LEAVE FORMS, YOU MUST BRING THEM TO THE OFFICE FOR PROCESSING.   °DO NOT GIVE THEM TO YOUR DOCTOR. ° °PAIN CONTROL ° °1. First take acetaminophen (Tylenol) AND/or ibuprofen (Advil) to control your pain after surgery.  Follow directions on package.  Taking acetaminophen (Tylenol) and/or ibuprofen (Advil) regularly after surgery will help to control your pain and lower the amount of prescription pain medication you may need.  You should not take more than 4,000 mg (4 grams) of acetaminophen (Tylenol) in 24 hours.  You should not take ibuprofen (Advil), aleve, motrin, naprosyn or other NSAIDS if you have a history of stomach ulcers or chronic kidney disease.  °2. A prescription for pain medication may be given to you upon discharge.  Take your pain medication as prescribed, if you still have uncontrolled pain after taking acetaminophen (Tylenol) or ibuprofen (Advil). °3. Use ice packs to help control pain. °4. If you need a refill on your pain medication, please contact your pharmacy.  They will contact our office to request authorization. Prescriptions will not be filled after 5pm or on week-ends. ° °HOME MEDICATIONS °5. Take your usually prescribed medications unless otherwise directed. ° °DIET °6. You should follow a light diet the first few days after arrival home.  Be sure to include lots of fluids daily. Avoid fatty, fried foods.  ° °CONSTIPATION °7. It is common to experience some constipation after surgery and if you are taking pain medication.  Increasing fluid intake and taking a stool  softener (such as Colace) will usually help or prevent this problem from occurring.  A mild laxative (Milk of Magnesia or Miralax) should be taken according to package instructions if there are no bowel movements after 48 hours. ° °WOUND/INCISION CARE °8. Most patients will experience some swelling and bruising in the area of the incisions.  Ice packs will help.  Swelling and bruising can take several days to resolve.  °9. Unless discharge instructions indicate otherwise, follow guidelines below  °a. STERI-STRIPS - you may remove your outer bandages 48 hours after surgery, and you may shower at that time.  You have steri-strips (small skin tapes) in place directly over the incision.  These strips should be left on the skin for 7-10 days.   °b. DERMABOND/SKIN GLUE - you may shower in 24 hours.  The glue will flake off over the next 2-3 weeks. °10. Any sutures or staples will be removed at the office during your follow-up visit. ° °ACTIVITIES °11. You may resume regular (light) daily activities beginning the next day--such as daily self-care, walking, climbing stairs--gradually increasing activities as tolerated.  You may have sexual intercourse when it is comfortable.  Refrain from any heavy lifting or straining until approved by your doctor. °a. You may drive when you are no longer taking prescription pain medication, you can comfortably wear a seatbelt, and you can safely maneuver your car and apply brakes. ° °FOLLOW-UP °12. You should see your doctor in the office for a follow-up appointment approximately 2-3 weeks after your surgery.  You should have been given your post-op/follow-up appointment when   your surgery was scheduled.  If you did not receive a post-op/follow-up appointment, make sure that you call for this appointment within a day or two after you arrive home to insure a convenient appointment time. ° °OTHER INSTRUCTIONS ° °WHEN TO CALL YOUR DOCTOR: °1. Fever over 101.0 °2. Inability to  urinate °3. Continued bleeding from incision. °4. Increased pain, redness, or drainage from the incision. °5. Increasing abdominal pain ° °The clinic staff is available to answer your questions during regular business hours.  Please don’t hesitate to call and ask to speak to one of the nurses for clinical concerns.  If you have a medical emergency, go to the nearest emergency room or call 911.  A surgeon from [facility] is always on call at the hospital. °[address], [address], [place], [place]  [address] ? [address] [address], [place], [place]   [address] °[phone] ? [phone] ? FAX [phone] ° ° ° °

## 2021-01-09 NOTE — Consult Note (Signed)
Ref: [name], FNP   Subjective:  No chest pain. No significant change in troponin levels in 24 hours. Echocardiogram shows good LV systolic function. VS stable.  Objective:  Vital Signs in the last 24 hours: Temp:  [97.6 F (36.4 C)-98.9 F (37.2 C)] 98.4 F (36.9 C) (02/15 0626) Pulse Rate:  [87-96] 91 (02/15 0626) Resp:  [16-18] 16 (02/15 0626) BP: (139-160)/(86-97) 145/94 (02/15 0626) SpO2:  [97 %-100 %] 97 % (02/15 0626)  Physical Exam: BP Readings from Last 1 Encounters:  01/09/21 (!) 145/94     Wt Readings from Last 1 Encounters:  01/08/21 99.8 kg    Weight change:  Body mass index is 35.51 kg/m. HEENT: [place]/AT, Eyes-Blue, Conjunctiva-Pink, Sclera-Non-icteric Neck: No JVD, No bruit, Trachea midline. Lungs:  Clear, Bilateral. Cardiac:  Regular rhythm, normal S1 and S2, no S3. II/VI systolic murmur. Abdomen:  Soft, non-tender. BS present. Extremities:  No edema present. No cyanosis. No clubbing. CNS: AxOx3, Cranial nerves grossly intact, moves all 4 extremities.  Skin: Warm and dry.   Intake/Output from previous day: 02/14 0701 - 02/15 0700 In: 488.1 [P.O.:240; I.V.:198.2; IV Piggyback:49.9] Out: 2400 [Urine:2400]    Lab Results: BMET    Component Value Date/Time   NA 138 01/09/2021 0313   NA 136 01/08/2021 0535   NA 137 01/01/2021 0910   NA 139 12/15/2017 1515   K 3.6 01/09/2021 0313   K 3.8 01/08/2021 0535   K 4.0 01/01/2021 0910   CL 103 01/09/2021 0313   CL 102 01/08/2021 0535   CL 108 01/01/2021 0910   CO2 25 01/09/2021 0313   CO2 22 01/08/2021 0535   CO2 23 01/01/2021 0910   GLUCOSE 112 (H) 01/09/2021 0313   GLUCOSE 191 (H) 01/08/2021 0535   GLUCOSE 122 (H) 01/01/2021 0910   BUN 9 01/09/2021 0313   BUN 15 01/08/2021 0535   BUN 13 01/01/2021 0910   BUN 14 12/15/2017 1515   CREATININE 0.66 01/09/2021 0313   CREATININE 0.62 01/08/2021 0535   CREATININE 0.73 01/01/2021 0910   CREATININE 0.64 12/25/2020 1315   CREATININE 0.81  12/18/2020 1331   CREATININE 0.94 12/17/2016 1248   CREATININE 0.76 08/03/2014 1021   CALCIUM 9.1 01/09/2021 0313   CALCIUM 9.7 01/08/2021 0535   CALCIUM 9.3 01/01/2021 0910   GFRNONAA >60 01/09/2021 0313   GFRNONAA >60 01/08/2021 0535   GFRNONAA >60 01/01/2021 0910   GFRNONAA >60 12/25/2020 1315   GFRNONAA >60 12/18/2020 1331   GFRAA >60 08/14/2020 0958   GFRAA >60 07/26/2020 0801   GFRAA 113 12/15/2017 1515   CBC    Component Value Date/Time   WBC 3.9 (L) 01/09/2021 0313   RBC 3.02 (L) 01/09/2021 0313   HGB 10.0 (L) 01/09/2021 0313   HGB 10.7 (L) 01/01/2021 0910   HGB 13.1 12/15/2017 1515   HCT 30.8 (L) 01/09/2021 0313   HCT 39.2 12/15/2017 1515   PLT 318 01/09/2021 0313   PLT 278 01/01/2021 0910   PLT 360 12/15/2017 1515   MCV 102.0 (H) 01/09/2021 0313   MCV 93 12/15/2017 1515   MCH 33.1 01/09/2021 0313   MCHC 32.5 01/09/2021 0313   RDW 15.0 01/09/2021 0313   RDW 13.4 12/15/2017 1515   LYMPHSABS 0.7 01/08/2021 0535   MONOABS 0.3 01/08/2021 0535   EOSABS 0.0 01/08/2021 0535   BASOSABS 0.1 01/08/2021 0535   HEPATIC Function Panel Recent Labs    01/01/21 0910 01/08/21 0535 01/09/21 0313  PROT 6.7 7.2 6.2*  HEMOGLOBIN A1C No components found for: HGA1C,  MPG CARDIAC ENZYMES No results found for: CKTOTAL, CKMB, CKMBINDEX, TROPONINI BNP No results for input(s): PROBNP in the last 8760 hours. TSH No results for input(s): TSH in the last 8760 hours. CHOLESTEROL No results for input(s): CHOL in the last 8760 hours.  Scheduled Meds: . Chlorhexidine Gluconate Cloth  6 each Topical Daily  . heparin  5,000 Units Subcutaneous Q8H  . metoprolol tartrate  12.5 mg Oral BID  . pantoprazole  40 mg Oral Daily   Continuous Infusions: . piperacillin-tazobactam (ZOSYN)  IV 3.375 g (01/09/21 0556)   PRN Meds:.labetalol, morphine injection, ondansetron **OR** ondansetron (ZOFRAN) IV, sodium chloride flush  Assessment/Plan: Abdominal pain Cholelithiasis Abnormal  troponin I from demand ischemia Left breast cancer, s/p chemotherapy Hyperglycemia Obesity Hypertension  May undergo GB surgery as needed.   LOS: 0 days   Time spent including chart review, lab review, examination, discussion with patient/Nurse : 30 min   Dixie Dials  MD  01/09/2021, 8:36 AM

## 2021-01-09 NOTE — Transfer of Care (Signed)
Immediate Anesthesia Transfer of Care Note  Patient: Lindsay Hampton  Procedure(s) Performed: LAPAROSCOPIC CHOLECYSTECTOMY WITH INTRAOPERATIVE CHOLANGIOGRAM (N/A Abdomen)  Patient Location: PACU  Anesthesia Type:General  Level of Consciousness: awake, alert , oriented and patient cooperative  Airway & Oxygen Therapy: Patient Spontanous Breathing and Patient connected to face mask oxygen  Post-op Assessment: Report given to RN, Post -op Vital signs reviewed and stable and Patient moving all extremities  Post vital signs: Reviewed and stable  Last Vitals:  Vitals Value Taken Time  BP 138/80 01/09/21 1136  Temp    Pulse 92 01/09/21 1137  Resp 18 01/09/21 1137  SpO2 100 % 01/09/21 1137  Vitals shown include unvalidated device data.  Last Pain:  Vitals:   01/09/21 0930  TempSrc: Oral  PainSc:       Patients Stated Pain Goal: 1 (90/24/09 7353)  Complications: No complications documented.

## 2021-01-09 NOTE — Anesthesia Procedure Notes (Signed)
Procedure Name: Intubation Date/Time: 01/09/2021 10:12 AM Performed by: Mitzie Na, CRNA Pre-anesthesia Checklist: Patient identified, Emergency Drugs available, Suction available and Patient being monitored Patient Re-evaluated:Patient Re-evaluated prior to induction Oxygen Delivery Method: Circle system utilized Preoxygenation: Pre-oxygenation with 100% oxygen Induction Type: IV induction Ventilation: Mask ventilation without difficulty Laryngoscope Size: Mac and 3 Grade View: Grade I Tube type: Oral Tube size: 7.0 mm Number of attempts: 1 Airway Equipment and Method: Stylet Placement Confirmation: ETT inserted through vocal cords under direct vision,  positive ETCO2 and breath sounds checked- equal and bilateral Secured at: 23 cm Tube secured with: Tape Dental Injury: Teeth and Oropharynx as per pre-operative assessment

## 2021-01-09 NOTE — Progress Notes (Addendum)
PROGRESS NOTE    Lindsay Hampton  FVC:[id number] DOB: [birth year] DOA: 01/08/2021 PCP: Lindsay Salvage, FNP   Brief Narrative:  This 55 years old female with PMH significant for left-sided breast cancer status post lumpectomy and undergoing adjuvant chemotherapy with Taxol, anxiety, anemia presents to the ED with c/p RUQ pain associated with nausea and nonbloody nonbilious vomiting that started after having dinner. She is found to have elevated liver enzymes on labs.  RUQ ultrasound shows cholelithiasis without acute cholecystitis, enlarged CBD concerning for choledocholithiasis.  CT abdomen suspicious for pancreatitis.  GI and general surgery was consulted.  Patient underwent laparoscopy cholecystectomy and IOC which was positive.  Patient is scheduled to have ERCP tomorrow.   Assessment & Plan:   Principal Problem:   RUQ pain Active Problems:   Malignant neoplasm of lower-outer quadrant of left breast of female, estrogen receptor positive ([place])   Hypertensive urgency   Elevated troponin   Acute biliary pancreatitis without infection or necrosis   # RUQ pain could be secondary to cholecystitis: - Patient presented with RUQ pain associated with nausea and vomiting. - There is concern for choledocholithiasis with suspected mild acute pancreatitis. - Found to have cholelithiasis without acute cholecystitis on ultrasound. - CT abdomen which showed mild pancreatitis, cholelithiasis biliary and gallbladder distention. - Lipase 25, LFTs elevated,  continue to trend LFTs. - Continue Zosyn for now for intra-abdominal infection - Keep NPO ( bowel rest) - Adequate pain control with morphine q. as needed - Continue  IV fluids. - GI and general surgery consulted. -  Patient underwent laparoscopy cholecystectomy and IOC - Plan: patient is going to have ERCP tomorrow.  #Left breast cancer : Due for chemo today, Notified Dr. Lindi Adie. It can be rescheduled.   # Hypertensive urgency,  likely secondary to pain: - Continue pain regimen - Labetalol PRN  Elevated troponin, suspect demand ischemia: Troponin 19->28>40 -Cardiology consulted by the ED provider. - Patient denies any chest pain, echo unremarkable. -Continue low-dose metoprolol.   DVT prophylaxis: Lovenox Code Status: Full code. Family Communication:  No family at bed side. Disposition Plan:   Status is: Observation  The patient will require care spanning > 2 midnights and should be moved to inpatient because: Inpatient level of care appropriate due to severity of illness  Dispo: The patient is from: Home              Anticipated d/c is to: Home              Anticipated d/c date is: 2 days              Patient currently is not medically stable to d/c.   Difficult to place patient No   Consultants:   Gastroenterology  General surgery  Procedures: Laparoscopic cholecystectomy and intraoperative cholangiogram. Antimicrobials:   Anti-infectives (From admission, onward)   Start     Dose/Rate Route Frequency Ordered Stop   01/08/21 1400  [MAR Hold]  piperacillin-tazobactam (ZOSYN) IVPB 3.375 g        (MAR Hold since Tue 01/09/2021 at 0928.Hold Reason: Transfer to a Procedural area.)   3.375 g 12.5 mL/hr over 240 Minutes Intravenous Every 8 hours 01/08/21 0844     01/08/21 0745  piperacillin-tazobactam (ZOSYN) IVPB 3.375 g        3.375 g 100 mL/hr over 30 Minutes Intravenous  Once 01/08/21 0732 01/08/21 0813      Subjective: Patient was seen and examined at bedside.  Overnight events noted.  Patient reports abdominal pain is improved, denies any nausea and vomiting.   She is going to have laparoscopy cholecystectomy today.  Objective: Vitals:   01/09/21 1200 01/09/21 1215 01/09/21 1230 01/09/21 1245  BP: 135/88 137/87 136/86 (!) 142/94  Pulse: 96 87 83 87  Resp: (!) 26 (!) 8 12 14   Temp:      TempSrc:      SpO2: 100% 100% 100% 100%  Weight:      Height:        Intake/Output Summary  (Last 24 hours) at 01/09/2021 1248 Last data filed at 01/09/2021 1137 Gross per 24 hour  Intake 1688.13 ml  Output 2610 ml  Net -921.87 ml   Filed Weights   01/08/21 0431  Weight: 99.8 kg    Examination:  General exam: Appears calm and comfortable, not in any acute distress. Respiratory system: Clear to auscultation. Respiratory effort normal. Cardiovascular system: S1 & S2 heard, RRR. No JVD, murmurs, rubs, gallops or clicks. No pedal edema. Gastrointestinal system: Abdomen is nondistended, soft . RUQ tenderness ++. No organomegaly or masses felt.  Normal bowel sounds heard. Central nervous system: Alert and oriented. No focal neurological deficits. Extremities: Symmetric 5 x 5 power. Skin: No rashes, lesions or ulcers Psychiatry: Judgement and insight appear normal. Mood & affect appropriate.     Data Reviewed: I have personally reviewed following labs and imaging studies  CBC: Recent Labs  Lab 01/08/21 0535 01/09/21 0313  WBC 5.8 3.9*  NEUTROABS 4.7  --   HGB 10.9* 10.0*  HCT 33.4* 30.8*  MCV 100.6* 102.0*  PLT 330 299   Basic Metabolic Panel: Recent Labs  Lab 01/08/21 0535 01/09/21 0313  NA 136 138  K 3.8 3.6  CL 102 103  CO2 22 25  GLUCOSE 191* 112*  BUN 15 9  CREATININE 0.62 0.66  CALCIUM 9.7 9.1   GFR: Estimated Creatinine Clearance: 96.9 mL/min (by C-G formula based on SCr of 0.66 mg/dL). Liver Function Tests: Recent Labs  Lab 01/08/21 0535 01/09/21 0313  AST 104* 142*  ALT 94* 157*  ALKPHOS 74 83  BILITOT 2.0* 3.1*  PROT 7.2 6.2*  ALBUMIN 4.1 3.3*   Recent Labs  Lab 01/08/21 0535 01/09/21 0313  LIPASE 25 21   No results for input(s): AMMONIA in the last 168 hours. Coagulation Profile: No results for input(s): INR, PROTIME in the last 168 hours. Cardiac Enzymes: No results for input(s): CKTOTAL, CKMB, CKMBINDEX, TROPONINI in the last 168 hours. BNP (last 3 results) No results for input(s): PROBNP in the last 8760 hours. HbA1C: No  results for input(s): HGBA1C in the last 72 hours. CBG: No results for input(s): GLUCAP in the last 168 hours. Lipid Profile: No results for input(s): CHOL, HDL, LDLCALC, TRIG, CHOLHDL, LDLDIRECT in the last 72 hours. Thyroid Function Tests: No results for input(s): TSH, T4TOTAL, FREET4, T3FREE, THYROIDAB in the last 72 hours. Anemia Panel: No results for input(s): VITAMINB12, FOLATE, FERRITIN, TIBC, IRON, RETICCTPCT in the last 72 hours. Sepsis Labs: No results for input(s): PROCALCITON, LATICACIDVEN in the last 168 hours.  Recent Results (from the past 240 hour(s))  Resp Panel by RT-PCR (Flu A&B, Covid) Nasopharyngeal Swab     Status: None   Collection Time: 01/08/21  6:41 AM   Specimen: Nasopharyngeal Swab; Nasopharyngeal(NP) swabs in vial transport medium  Result Value Ref Range Status   SARS Coronavirus 2 by RT PCR NEGATIVE NEGATIVE Final    Comment: (NOTE) SARS-CoV-2 target nucleic acids are NOT DETECTED.  The SARS-CoV-2 RNA is generally detectable in upper respiratory specimens during the acute phase of infection. The lowest concentration of SARS-CoV-2 viral copies this assay can detect is 138 copies/mL. A negative result does not preclude SARS-Cov-2 infection and should not be used as the sole basis for treatment or other patient management decisions. A negative result may occur with  improper specimen collection/handling, submission of specimen other than nasopharyngeal swab, presence of viral mutation(s) within the areas targeted by this assay, and inadequate number of viral copies(<138 copies/mL). A negative result must be combined with clinical observations, patient history, and epidemiological information. The expected result is Negative.  Fact Sheet for Patients:  EntrepreneurPulse.com.au  Fact Sheet for Healthcare Providers:  IncredibleEmployment.be  This test is no t yet approved or cleared by the [place] FDA and  has  been authorized for detection and/or diagnosis of SARS-CoV-2 by FDA under an Emergency Use Authorization (EUA). This EUA will remain  in effect (meaning this test can be used) for the duration of the COVID-19 declaration under Section 564(b)(1) of the Act, 21 U.S.C.section 360bbb-3(b)(1), unless the authorization is terminated  or revoked sooner.       Influenza A by PCR NEGATIVE NEGATIVE Final   Influenza B by PCR NEGATIVE NEGATIVE Final    Comment: (NOTE) The Xpert Xpress SARS-CoV-2/FLU/RSV plus assay is intended as an aid in the diagnosis of influenza from Nasopharyngeal swab specimens and should not be used as a sole basis for treatment. Nasal washings and aspirates are unacceptable for Xpert Xpress SARS-CoV-2/FLU/RSV testing.  Fact Sheet for Patients: EntrepreneurPulse.com.au  Fact Sheet for Healthcare Providers: IncredibleEmployment.be  This test is not yet approved or cleared by the [place] FDA and has been authorized for detection and/or diagnosis of SARS-CoV-2 by FDA under an Emergency Use Authorization (EUA). This EUA will remain in effect (meaning this test can be used) for the duration of the COVID-19 declaration under Section 564(b)(1) of the Act, 21 U.S.C. section 360bbb-3(b)(1), unless the authorization is terminated or revoked.  Performed at [facility], [place] [address] [place], [place] [address]     Radiology Studies: MR 3D Recon At Scanner  Result Date: 01/08/2021 CLINICAL DATA:  Right upper quadrant pain. Jaundice. Breast cancer. Evaluate possible pancreatitis. EXAM: MRI ABDOMEN WITHOUT AND WITH CONTRAST (INCLUDING MRCP) TECHNIQUE: Multiplanar multisequence MR imaging of the abdomen was performed both before and after the administration of intravenous contrast. Heavily T2-weighted images of the biliary and pancreatic ducts were obtained, and three-dimensional MRCP images were rendered by post  processing. CONTRAST:  11mL GADAVIST GADOBUTROL 1 MMOL/ML IV SOLN COMPARISON:  9 cc Gadavist abdominal ultrasound of earlier today. CTA of the chest, abdomen, and pelvis of earlier today. FINDINGS: Lower chest: Normal heart size without pericardial or pleural effusion. Hepatobiliary: No focal liver lesion. Moderate intrahepatic biliary duct dilatation. Multiple tiny gallstones without specific evidence of acute cholecystitis. Moderate common duct dilatation, including at 1.0 cm on 15/12. The common duct tapers gradually, without evidence of obstructive stone or mass. Pancreas: Mild peripancreatic edema, including on 21/4. No evidence of pancreatic necrosis, duct dilatation, or well-circumscribed peripancreatic fluid collection. Spleen:  Normal in size, without focal abnormality. Adrenals/Urinary Tract: Normal adrenal glands. Normal kidneys, without hydronephrosis. Stomach/Bowel: Tiny hiatal hernia.  Normal abdominal bowel loops. Vascular/Lymphatic: Aortic atherosclerosis. Retroaortic left renal vein. No retroperitoneal or retrocrural adenopathy. Other:  Trace perihepatic ascites. Musculoskeletal: No acute osseous abnormality. Minimal convex left lumbar spine curvature. IMPRESSION: 1. Cholelithiasis. Moderate intra and extrahepatic biliary duct  dilatation, without obstructive stone or mass. Favor recent stone passage. Consider surveillance of bilirubin. If persistently elevated, ERCP should be considered to exclude less likely occult ampullary stenosis. 2. Non complicated mild pancreatitis. 3.  Aortic Atherosclerosis (ICD10-I70.0). 4.  Tiny hiatal hernia. 5. Trace perihepatic ascites. Electronically Signed   By: Abigail Miyamoto M.D.   On: 01/08/2021 12:59   DG Chest Portable 1 View  Result Date: 01/08/2021 CLINICAL DATA:  Chest pain, vomiting, upper back EXAM: PORTABLE CHEST 1 VIEW COMPARISON:  Chest x-ray 09/12/2020. FINDINGS: Right chest wall Port-A-Cath with tip overlying the expected region of the superior  cavoatrial junction. The heart size and mediastinal contours are within normal limits. No focal consolidation. No pulmonary edema. No pleural effusion. No pneumothorax. No acute osseous abnormality. Partially visualized foci of gas within the right upper quadrant of unclear etiology. IMPRESSION: 1. No active disease. 2. Partially visualized foci of gas within the right upper quadrant of unclear etiology. Finding likely represents gas within the large bowel. Recommend x-ray abdomen for further evaluation. Electronically Signed   By: Iven Finn M.D.   On: 01/08/2021 05:56   MR ABDOMEN MRCP W WO CONTAST  Result Date: 01/08/2021 CLINICAL DATA:  Right upper quadrant pain. Jaundice. Breast cancer. Evaluate possible pancreatitis. EXAM: MRI ABDOMEN WITHOUT AND WITH CONTRAST (INCLUDING MRCP) TECHNIQUE: Multiplanar multisequence MR imaging of the abdomen was performed both before and after the administration of intravenous contrast. Heavily T2-weighted images of the biliary and pancreatic ducts were obtained, and three-dimensional MRCP images were rendered by post processing. CONTRAST:  34mL GADAVIST GADOBUTROL 1 MMOL/ML IV SOLN COMPARISON:  9 cc Gadavist abdominal ultrasound of earlier today. CTA of the chest, abdomen, and pelvis of earlier today. FINDINGS: Lower chest: Normal heart size without pericardial or pleural effusion. Hepatobiliary: No focal liver lesion. Moderate intrahepatic biliary duct dilatation. Multiple tiny gallstones without specific evidence of acute cholecystitis. Moderate common duct dilatation, including at 1.0 cm on 15/12. The common duct tapers gradually, without evidence of obstructive stone or mass. Pancreas: Mild peripancreatic edema, including on 21/4. No evidence of pancreatic necrosis, duct dilatation, or well-circumscribed peripancreatic fluid collection. Spleen:  Normal in size, without focal abnormality. Adrenals/Urinary Tract: Normal adrenal glands. Normal kidneys, without  hydronephrosis. Stomach/Bowel: Tiny hiatal hernia.  Normal abdominal bowel loops. Vascular/Lymphatic: Aortic atherosclerosis. Retroaortic left renal vein. No retroperitoneal or retrocrural adenopathy. Other:  Trace perihepatic ascites. Musculoskeletal: No acute osseous abnormality. Minimal convex left lumbar spine curvature. IMPRESSION: 1. Cholelithiasis. Moderate intra and extrahepatic biliary duct dilatation, without obstructive stone or mass. Favor recent stone passage. Consider surveillance of bilirubin. If persistently elevated, ERCP should be considered to exclude less likely occult ampullary stenosis. 2. Non complicated mild pancreatitis. 3.  Aortic Atherosclerosis (ICD10-I70.0). 4.  Tiny hiatal hernia. 5. Trace perihepatic ascites. Electronically Signed   By: Abigail Miyamoto M.D.   On: 01/08/2021 12:59   ECHOCARDIOGRAM COMPLETE  Result Date: 01/08/2021    ECHOCARDIOGRAM REPORT   Patient Name:   Lindsay Hampton Date of Exam: 01/08/2021 Medical Rec #:  [id number]       Height:       66.0 in Accession #:    [id number]      Weight:       220.0 lb Date of Birth:  1966/08/06       BSA:          2.082 m Patient Age:    9 years        BP:  152/89 mmHg Patient Gender: F               HR:           99 bpm. Exam Location:  Inpatient Procedure: 2D Echo, Cardiac Doppler, Color Doppler, 3D Echo and Strain Analysis Indications:     Z51.11 Encounter for antineoplastic chemotheraphy  History:         Patient has prior history of Echocardiogram examinations, most                  recent 09/08/2020. Risk Factors:Hypertension and GERD. Breast                  Cancer.  Sonographer:     Tiffany Dance Referring Phys:  [place] Diagnosing Phys: Dixie Dials MD IMPRESSIONS  1. GLS Endo Peak A4C: -17 %, A2C is -14.6 % and A3C is -17.7 %.. Left ventricular ejection fraction, by estimation, is 60 to 65%. The left ventricle has normal function. The left ventricle has no regional wall motion abnormalities. Left  ventricular diastolic parameters are consistent with Grade I diastolic dysfunction (impaired relaxation).  2. Right ventricular systolic function is normal. The right ventricular size is normal.  3. Left atrial size was mildly dilated.  4. The mitral valve is degenerative. Mild mitral valve regurgitation.  5. The aortic valve is tricuspid. There is mild calcification of the aortic valve. There is mild thickening of the aortic valve. Aortic valve regurgitation is not visualized.  6. The inferior vena cava is normal in size with greater than 50% respiratory variability, suggesting right atrial pressure of 3 mmHg. Comparison(s): No significant change from prior study. FINDINGS  Left Ventricle: GLS Endo Peak A4C: -17 %, A2C is -14.6 % and A3C is -17.7 %. Left ventricular ejection fraction, by estimation, is 60 to 65%. The left ventricle has normal function. The left ventricle has no regional wall motion abnormalities. The left ventricular internal cavity size was normal in size. There is no left ventricular hypertrophy. Left ventricular diastolic parameters are consistent with Grade I diastolic dysfunction (impaired relaxation). Right Ventricle: The right ventricular size is normal. No increase in right ventricular wall thickness. Right ventricular systolic function is normal. Left Atrium: Left atrial size was mildly dilated. Right Atrium: Right atrial size was normal in size. Pericardium: There is no evidence of pericardial effusion. Mitral Valve: The mitral valve is degenerative in appearance. There is mild calcification of the mitral valve leaflet(s). Mild mitral annular calcification. Mild mitral valve regurgitation. Tricuspid Valve: The tricuspid valve is normal in structure. Tricuspid valve regurgitation is mild. Aortic Valve: The aortic valve is tricuspid. There is mild calcification of the aortic valve. There is mild thickening of the aortic valve. Aortic valve regurgitation is not visualized. Pulmonic Valve:  The pulmonic valve was normal in structure. Pulmonic valve regurgitation is not visualized. Aorta: The aortic root is normal in size and structure. Venous: The inferior vena cava is normal in size with greater than 50% respiratory variability, suggesting right atrial pressure of 3 mmHg. IAS/Shunts: The interatrial septum was not well visualized.  LEFT VENTRICLE PLAX 2D LVIDd:         4.70 cm  Diastology LVIDs:         3.50 cm  LV e' medial:    7.72 cm/s LV PW:         1.50 cm  LV E/e' medial:  10.5 LV IVS:        1.00 cm  LV e'  lateral:   7.40 cm/s LVOT diam:     1.90 cm  LV E/e' lateral: 11.0 LV SV:         56 LV SV Index:   27 LVOT Area:     2.84 cm  RIGHT VENTRICLE             IVC RV Basal diam:  2.50 cm     IVC diam: 1.40 cm RV S prime:     15.10 cm/s TAPSE (M-mode): 2.5 cm LEFT ATRIUM             Index       RIGHT ATRIUM           Index LA diam:        4.30 cm 2.06 cm/m  RA Area:     10.50 cm LA Vol (A2C):   63.7 ml 30.59 ml/m RA Volume:   22.90 ml  11.00 ml/m LA Vol (A4C):   43.1 ml 20.70 ml/m LA Biplane Vol: 53.6 ml 25.74 ml/m  AORTIC VALVE LVOT Vmax:   101.00 cm/s LVOT Vmean:  63.800 cm/s LVOT VTI:    0.197 m  AORTA Ao Root diam: 3.20 cm Ao Asc diam:  3.30 cm MITRAL VALVE MV Area (PHT): 3.37 cm    SHUNTS MV Decel Time: 225 msec    Systemic VTI:  0.20 m MV E velocity: 81.20 cm/s  Systemic Diam: 1.90 cm MV A velocity: 88.60 cm/s MV E/A ratio:  0.92 Dixie Dials MD Electronically signed by Dixie Dials MD Signature Date/Time: 01/08/2021/6:20:56 PM    Final    CT Angio Chest/Abd/Pel for Dissection W and/or Wo Contrast  Result Date: 01/08/2021 CLINICAL DATA:  Left-sided breast cancer undergoing chemotherapy. Vomiting and upper back pain EXAM: CT ANGIOGRAPHY CHEST, ABDOMEN AND PELVIS TECHNIQUE: Non-contrast CT of the chest was initially obtained. Multidetector CT imaging through the chest, abdomen and pelvis was performed using the standard protocol during bolus administration of intravenous contrast.  Multiplanar reconstructed images and MIPs were obtained and reviewed to evaluate the vascular anatomy. CONTRAST:  18mL OMNIPAQUE IOHEXOL 350 MG/ML SOLN COMPARISON:  None. FINDINGS: CTA CHEST FINDINGS Cardiovascular: Normal heart size. No pericardial effusion. No aortic dissection, aneurysm, or intramural hematoma. Porta catheter on the right with tip in good position at the upper cavoatrial junction. Mediastinum/Nodes: Negative for adenopathy or mass. Postoperative left breast. Lungs/Pleura: The central airways are clear. There is no edema, consolidation, effusion, or pneumothorax. Musculoskeletal: Patchy areas of thoracic spine sclerosis which are adjacent endplates and appear reactive. No acute finding. Review of the MIP images confirms the above findings. CTA ABDOMEN AND PELVIS FINDINGS VASCULAR Aorta: Normal caliber aorta without aneurysm, dissection, vasculitis or significant stenosis. Celiac: Patent without evidence of aneurysm, dissection, vasculitis or significant stenosis. SMA: Patent without evidence of aneurysm, dissection, vasculitis or significant stenosis. Renals: Unremarkable IMA: Patent Inflow: Unremarkable Veins: Unremarkable in the venous phase Review of the MIP images confirms the above findings. NON-VASCULAR Hepatobiliary: No focal liver abnormality.Distended gallbladder and bile ducts. The common bile duct measures up to 1 cm in diameter. No visible choledocholithiasis. There is a calcified gallstone. Pancreas: Mild stranding around the pancreas, especially at the body and tail. No fluid collection or detectable necrosis. Spleen: Unremarkable. Adrenals/Urinary Tract: Negative adrenals. No hydronephrosis or stone. Unremarkable bladder. Stomach/Bowel:  No obstruction. No appendicitis. Lymphatic: No mass or adenopathy. Reproductive:2.4 cm subserosal fibroid. Other: No ascites or pneumoperitoneum. Musculoskeletal: No acute abnormalities. Review of the MIP images confirms the above findings.  IMPRESSION: Suspect  mild acute edematous pancreatitis. There is cholelithiasis with biliary and gallbladder distension but no detected choledocholithiasis, consider MRCP. Normal CTA of the aorta. Electronically Signed   By: Monte Fantasia M.D.   On: 01/08/2021 07:28   US Abdomen Limited RUQ (LIVER/GB)  Result Date: 01/08/2021 CLINICAL DATA:  Right upper quadrant pain X 1 day. History of breast cancer. EXAM: ULTRASOUND ABDOMEN LIMITED RIGHT UPPER QUADRANT COMPARISON:  None. FINDINGS: Gallbladder: 8 mm calcified gallstone noted within the gallbladder lumen. No pericholecystic fluid or wall thickening visualized. No sonographic Murphy sign noted by sonographer. Intrahepatic biliary ducts are measuring at the upper limits of normal. Common bile duct: Diameter: Dilated measuring up to 10 mm. Liver: No focal lesion identified. Within normal limits in parenchymal echogenicity. Portal vein is patent on color Doppler imaging with normal direction of blood flow towards the liver. Other: None. IMPRESSION: 1. Cholelithiasis with no findings of acute cholecystitis. 2. Enlarged common bile duct concerning for choledocholithiasis. Correlate with liver function tests, and if clinically indicated consider MRCP for further evaluation. Electronically Signed   By: Iven Finn M.D.   On: 01/08/2021 05:59   Scheduled Meds: . [MAR Hold] Chlorhexidine Gluconate Cloth  6 each Topical Daily  . [MAR Hold] heparin  5,000 Units Subcutaneous Q8H  . [MAR Hold] metoprolol tartrate  12.5 mg Oral BID  . [MAR Hold] pantoprazole  40 mg Oral Daily   Continuous Infusions: . [MAR Hold] piperacillin-tazobactam (ZOSYN)  IV 3.375 g (01/09/21 0556)     LOS: 0 days    Time spent: 35 mins    Myles Tavella, MD [facility]   If 7PM-7AM, please contact night-coverage

## 2021-01-09 NOTE — Anesthesia Postprocedure Evaluation (Signed)
Anesthesia Post Note  Patient: Lindsay Hampton  Procedure(s) Performed: LAPAROSCOPIC CHOLECYSTECTOMY WITH INTRAOPERATIVE CHOLANGIOGRAM (N/A Abdomen)     Patient location during evaluation: PACU Anesthesia Type: General Level of consciousness: awake and alert Pain management: pain level controlled Vital Signs Assessment: post-procedure vital signs reviewed and stable Respiratory status: spontaneous breathing, nonlabored ventilation, respiratory function stable and patient connected to nasal cannula oxygen Cardiovascular status: blood pressure returned to baseline and stable Postop Assessment: no apparent nausea or vomiting Anesthetic complications: no   No complications documented.  Last Vitals:  Vitals:   01/09/21 1145 01/09/21 1200  BP: (!) 149/87 135/88  Pulse: 93 96  Resp: 17 (!) 26  Temp:    SpO2: 100% 100%    Last Pain:  Vitals:   01/09/21 1200  TempSrc:   PainSc: 0-No pain                 Audry Pili

## 2021-01-10 ENCOUNTER — Inpatient Hospital Stay (HOSPITAL_COMMUNITY): Payer: 59 | Admitting: Anesthesiology

## 2021-01-10 ENCOUNTER — Inpatient Hospital Stay (HOSPITAL_COMMUNITY): Payer: 59

## 2021-01-10 ENCOUNTER — Encounter (HOSPITAL_COMMUNITY): Payer: Self-pay | Admitting: Surgery

## 2021-01-10 ENCOUNTER — Other Ambulatory Visit: Payer: Self-pay | Admitting: Hematology and Oncology

## 2021-01-10 ENCOUNTER — Encounter (HOSPITAL_COMMUNITY)
Admission: EM | Disposition: A | Discharge status: Home / Self Care | Payer: Self-pay | Source: Home / Self Care | Attending: Internal Medicine

## 2021-01-10 DIAGNOSIS — K838 Other specified diseases of biliary tract: Secondary | ICD-10-CM

## 2021-01-10 DIAGNOSIS — K8050 Calculus of bile duct without cholangitis or cholecystitis without obstruction: Secondary | ICD-10-CM | POA: Diagnosis not present

## 2021-01-10 DIAGNOSIS — R932 Abnormal findings on diagnostic imaging of liver and biliary tract: Secondary | ICD-10-CM

## 2021-01-10 DIAGNOSIS — K810 Acute cholecystitis: Secondary | ICD-10-CM | POA: Diagnosis present

## 2021-01-10 DIAGNOSIS — R1011 Right upper quadrant pain: Secondary | ICD-10-CM

## 2021-01-10 DIAGNOSIS — E876 Hypokalemia: Secondary | ICD-10-CM

## 2021-01-10 DIAGNOSIS — R778 Other specified abnormalities of plasma proteins: Secondary | ICD-10-CM | POA: Diagnosis not present

## 2021-01-10 DIAGNOSIS — K851 Biliary acute pancreatitis without necrosis or infection: Secondary | ICD-10-CM | POA: Diagnosis not present

## 2021-01-10 DIAGNOSIS — R7989 Other specified abnormal findings of blood chemistry: Secondary | ICD-10-CM | POA: Diagnosis present

## 2021-01-10 HISTORY — PX: SPHINCTEROTOMY: SHX5544

## 2021-01-10 HISTORY — PX: ERCP: SHX5425

## 2021-01-10 LAB — COMPREHENSIVE METABOLIC PANEL
ALT: 199 U/L — ABNORMAL HIGH (ref 0–44)
AST: 122 U/L — ABNORMAL HIGH (ref 15–41)
Albumin: 3.4 g/dL — ABNORMAL LOW (ref 3.5–5.0)
Alkaline Phosphatase: 114 U/L (ref 38–126)
Anion gap: 11 (ref 5–15)
BUN: 6 mg/dL (ref 6–20)
CO2: 24 mmol/L (ref 22–32)
Calcium: 9.2 mg/dL (ref 8.9–10.3)
Chloride: 104 mmol/L (ref 98–111)
Creatinine, Ser: 0.73 mg/dL (ref 0.44–1.00)
GFR, Estimated: >60 mL/min (ref >60)
Glucose, Bld: 110 mg/dL — ABNORMAL HIGH (ref 70–99)
Potassium: 3.3 mmol/L — ABNORMAL LOW (ref 3.5–5.1)
Sodium: 139 mmol/L (ref 135–145)
Total Bilirubin: 1.5 mg/dL — ABNORMAL HIGH (ref 0.3–1.2)
Total Protein: 6.3 g/dL — ABNORMAL LOW (ref 6.5–8.1)

## 2021-01-10 LAB — CBC
HCT: 29.8 % — ABNORMAL LOW (ref 36.0–46.0)
Hemoglobin: 9.7 g/dL — ABNORMAL LOW (ref 12.0–15.0)
MCH: 32.9 pg (ref 26.0–34.0)
MCHC: 32.6 g/dL (ref 30.0–36.0)
MCV: 101 fL — ABNORMAL HIGH (ref 80.0–100.0)
Platelets: 311 10*3/uL (ref 150–400)
RBC: 2.95 MIL/uL — ABNORMAL LOW (ref 3.87–5.11)
RDW: 14.8 % (ref 11.5–15.5)
WBC: 6 10*3/uL (ref 4.0–10.5)
nRBC: 0.3 % — ABNORMAL HIGH (ref 0.0–0.2)

## 2021-01-10 LAB — PHOSPHORUS: Phosphorus: 3.6 mg/dL (ref 2.5–4.6)

## 2021-01-10 LAB — MAGNESIUM: Magnesium: 2 mg/dL (ref 1.7–2.4)

## 2021-01-10 LAB — LIPASE, BLOOD: Lipase: 56 U/L — ABNORMAL HIGH (ref 11–51)

## 2021-01-10 LAB — PREGNANCY, URINE: Preg Test, Ur: NEGATIVE

## 2021-01-10 LAB — SURGICAL PATHOLOGY

## 2021-01-10 IMAGING — RF DG ERCP WO/W SPHINCTEROTOMY
1 series · 10 of 10 positions shown · non-contrast
Comparison: Cholangiogram [DATE]

CLINICAL DATA: Cholelithiasis. Jaundice. Right upper quadrant pain.

EXAM:
ERCP
TECHNIQUE: Multiple spot images obtained with the fluoroscopic device and
submitted for interpretation post-procedure.
FLUOROSCOPY TIME:  Fluoroscopy Time:  1 minutes 44 seconds
Radiation Exposure Index (if provided by the fluoroscopic device):
Unknown
Number of Acquired Spot Images: 0

[Series 1: run · 10 of 10 slices shown]
[im 1/10]
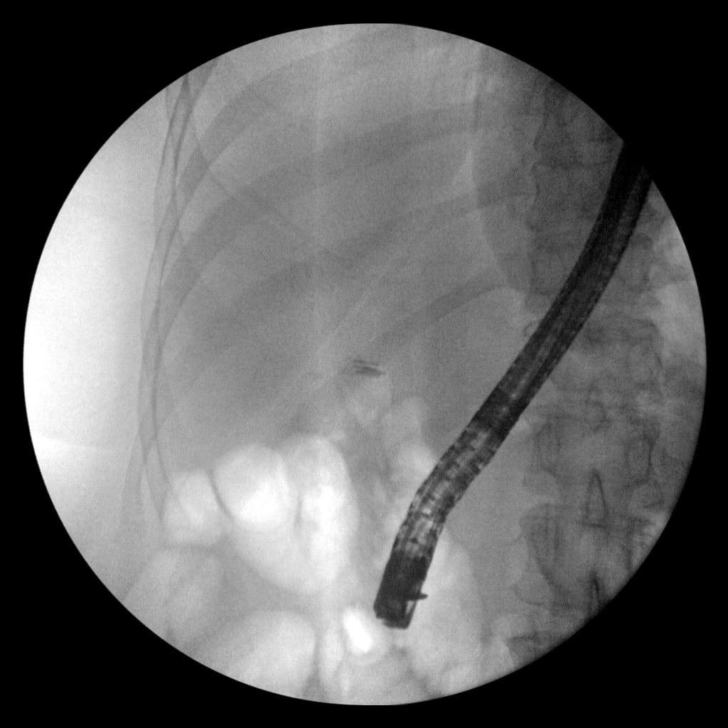
[im 2/10]
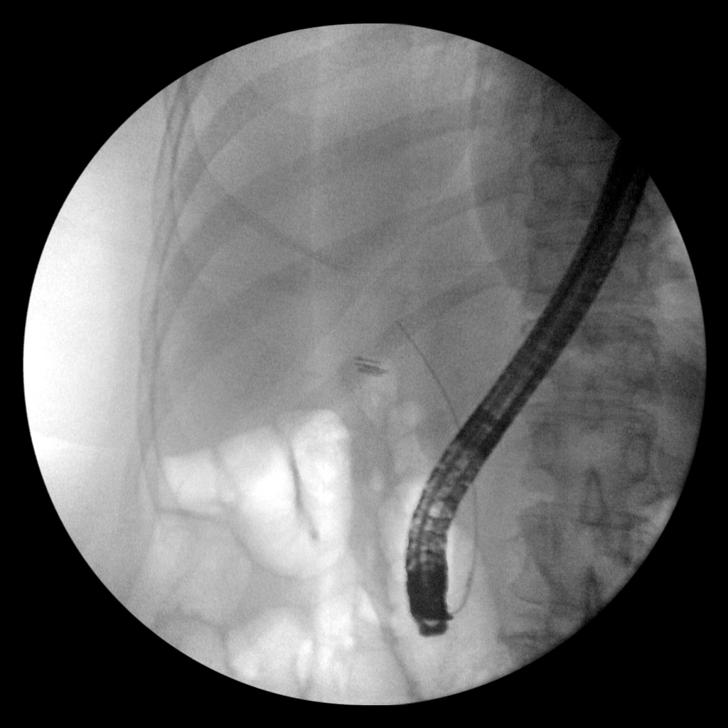
[im 3/10]
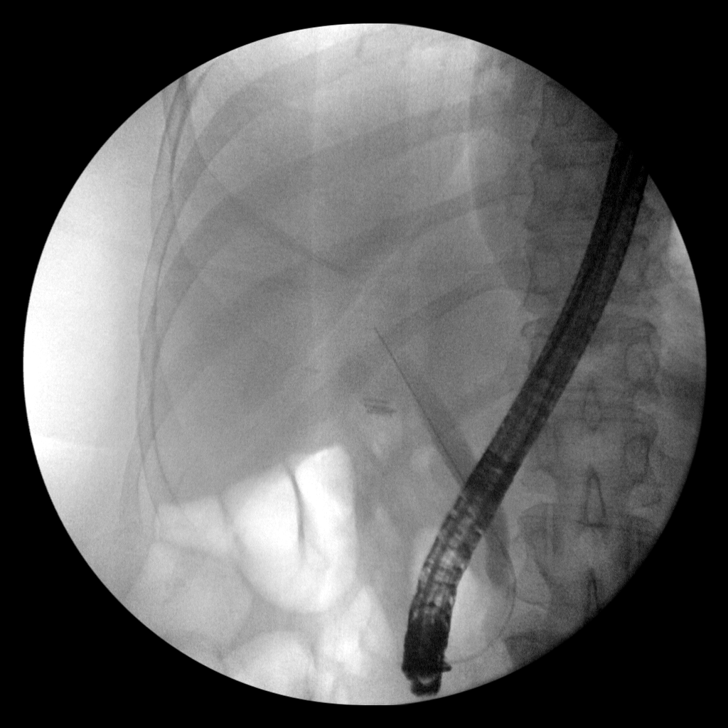
[im 4/10]
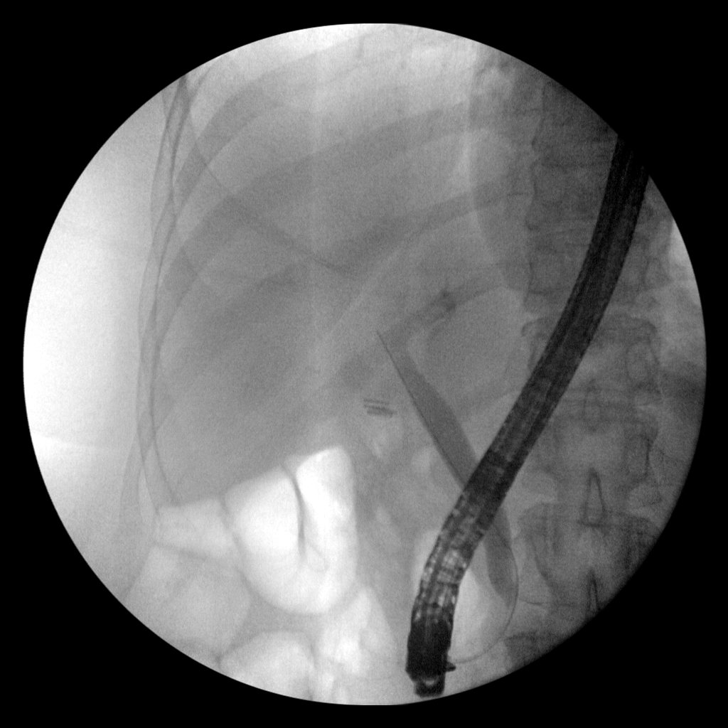
[im 5/10]
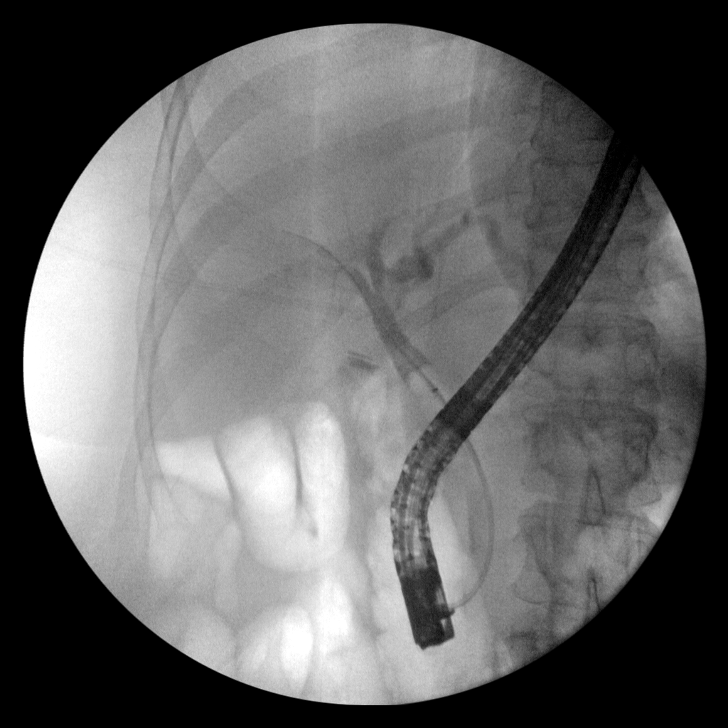
[im 6/10]
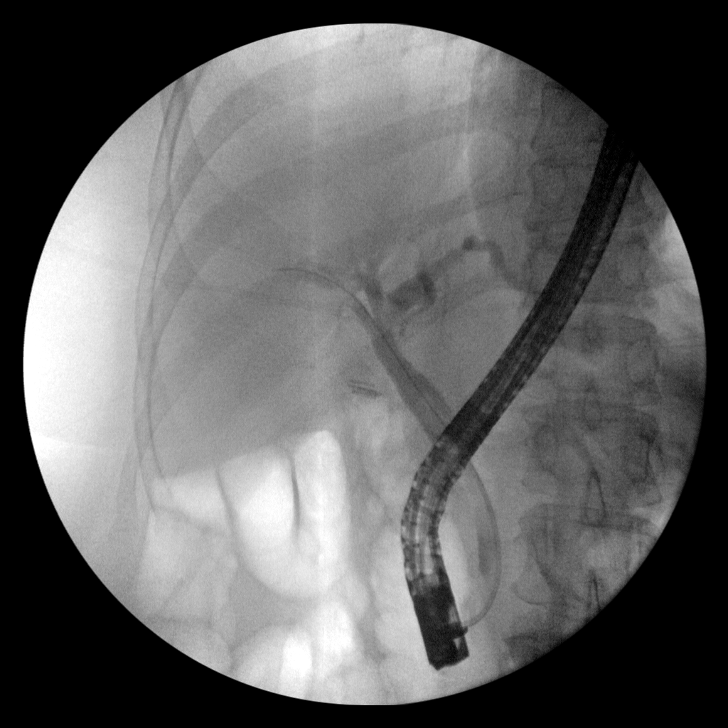
[im 7/10]
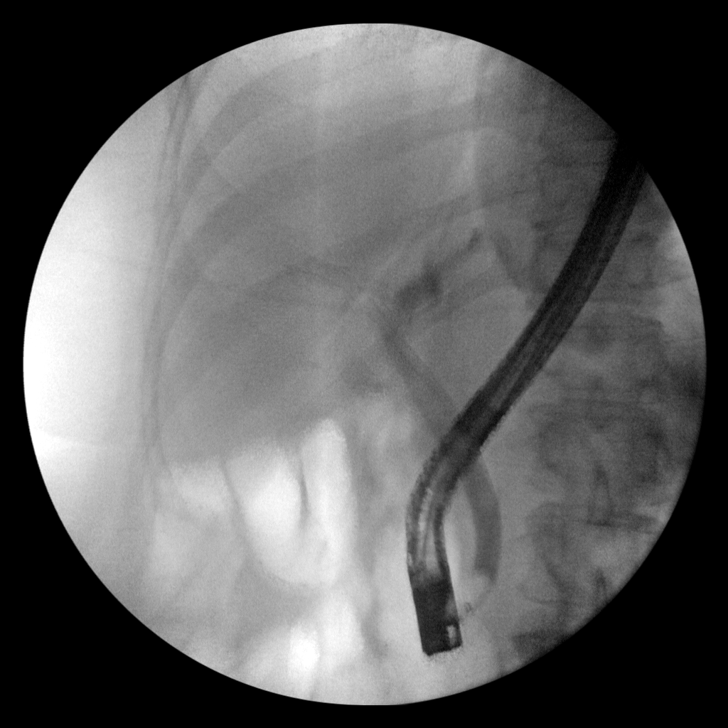
[im 8/10]
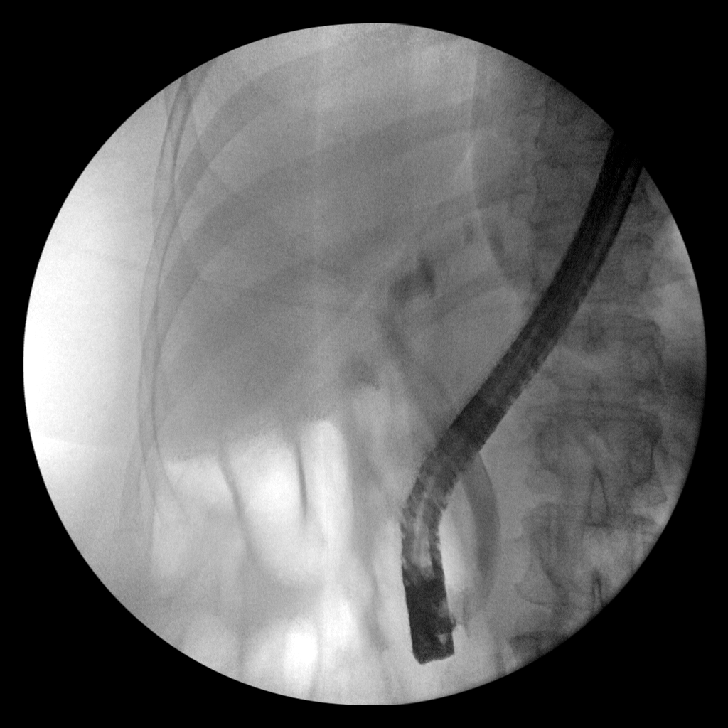
[im 9/10]
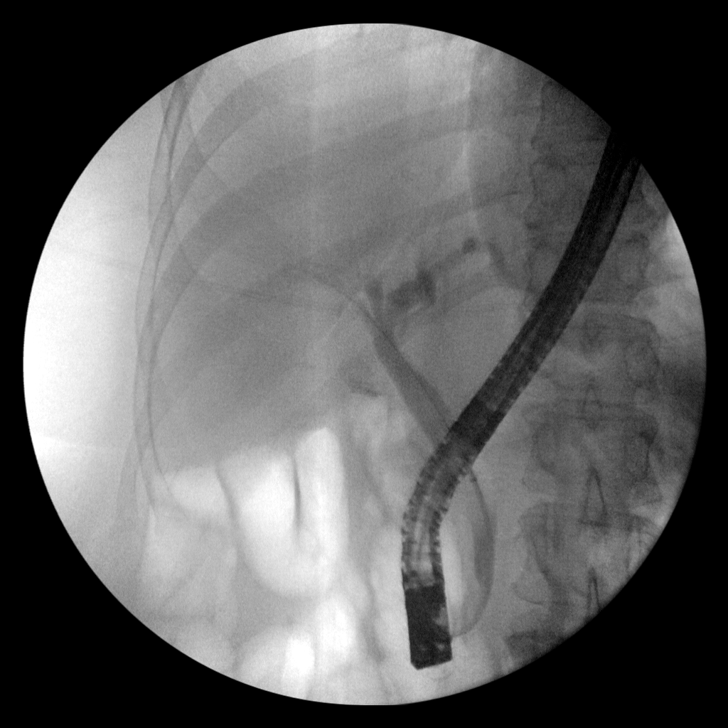
[im 10/10]
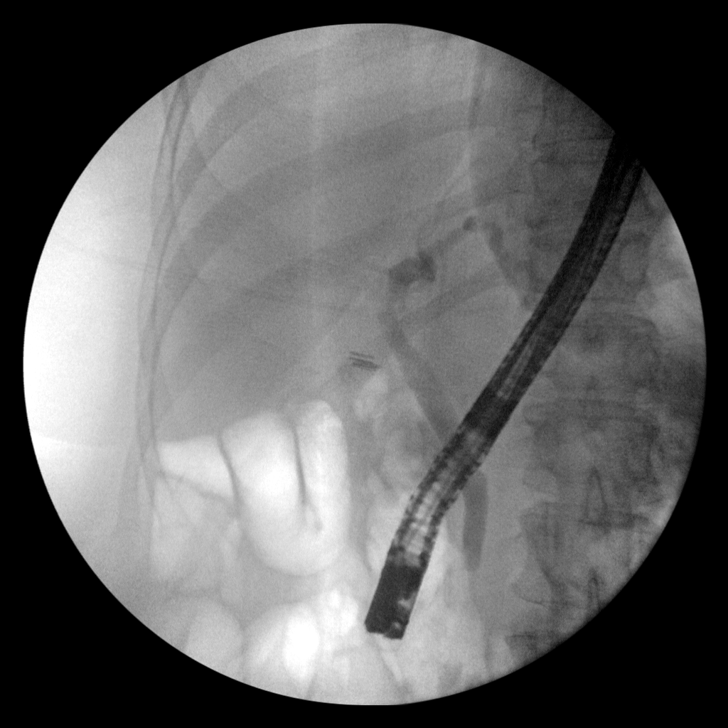

[10 of 10 positions shown; findings below may reference images not displayed]

FINDINGS: Ten intraoperative fluoroscopic images are submitted for
interpretation. The submitted images demonstrate cannulation and
opacification of the intra and extrahepatic bile duct with balloon
sweep. No filling defects identified within the common bile duct on
the final image.
IMPRESSION: ERCP as above.

These images were submitted for radiologic interpretation only.
Please see the procedural report for the amount of contrast and the
fluoroscopy time utilized.

## 2021-01-10 SURGERY — ERCP, WITH INTERVENTION IF INDICATED
Anesthesia: General

## 2021-01-10 MED ORDER — GLUCAGON HCL RDNA (DIAGNOSTIC) 1 MG IJ SOLR
INTRAMUSCULAR | Status: DC | PRN
  Administered 2021-01-10 (×2): .25 mg via INTRAVENOUS

## 2021-01-10 MED ORDER — MIDAZOLAM HCL 2 MG/2ML IJ SOLN
INTRAMUSCULAR | Status: AC
  Filled 2021-01-10: qty 2

## 2021-01-10 MED ORDER — SODIUM CHLORIDE 0.9 % IV SOLN
INTRAVENOUS | Status: DC | PRN
  Administered 2021-01-10: 30 mL

## 2021-01-10 MED ORDER — INDOMETHACIN 50 MG RE SUPP
RECTAL | Status: AC
  Filled 2021-01-10: qty 2

## 2021-01-10 MED ORDER — FENTANYL CITRATE (PF) 250 MCG/5ML IJ SOLN: INTRAMUSCULAR | Status: DC | PRN

## 2021-01-10 MED ORDER — PROPOFOL 10 MG/ML IV BOLUS
INTRAVENOUS | Status: DC | PRN
  Administered 2021-01-10: 130 mg via INTRAVENOUS

## 2021-01-10 MED ORDER — DEXAMETHASONE SODIUM PHOSPHATE 10 MG/ML IJ SOLN
INTRAMUSCULAR | Status: DC | PRN
  Administered 2021-01-10: 10 mg via INTRAVENOUS

## 2021-01-10 MED ORDER — PHENYLEPHRINE 40 MCG/ML (10ML) SYRINGE FOR IV PUSH (FOR BLOOD PRESSURE SUPPORT)
PREFILLED_SYRINGE | INTRAVENOUS | Status: DC | PRN
  Administered 2021-01-10: 80 ug via INTRAVENOUS
  Administered 2021-01-10 (×2): 120 ug via INTRAVENOUS
  Administered 2021-01-10 (×2): 80 ug via INTRAVENOUS

## 2021-01-10 MED ORDER — MIDAZOLAM HCL 2 MG/2ML IJ SOLN
INTRAMUSCULAR | Status: DC | PRN
  Administered 2021-01-10: 2 mg via INTRAVENOUS

## 2021-01-10 MED ORDER — SUGAMMADEX SODIUM 500 MG/5ML IV SOLN
INTRAVENOUS | Status: DC | PRN
  Administered 2021-01-10: 300 mg via INTRAVENOUS

## 2021-01-10 MED ORDER — GLUCAGON HCL RDNA (DIAGNOSTIC) 1 MG IJ SOLR
INTRAMUSCULAR | Status: AC
  Filled 2021-01-10: qty 1

## 2021-01-10 MED ORDER — FENTANYL CITRATE (PF) 100 MCG/2ML IJ SOLN
INTRAMUSCULAR | Status: AC
  Filled 2021-01-10: qty 2

## 2021-01-10 MED ORDER — INDOMETHACIN 50 MG RE SUPP
RECTAL | Status: DC | PRN
  Administered 2021-01-10: 100 mg via RECTAL

## 2021-01-10 MED ORDER — LACTATED RINGERS IV SOLN
INTRAVENOUS | Status: DC
  Administered 2021-01-10: 1000 mL via INTRAVENOUS

## 2021-01-10 MED ORDER — SUCCINYLCHOLINE CHLORIDE 200 MG/10ML IV SOSY
PREFILLED_SYRINGE | INTRAVENOUS | Status: DC | PRN
  Administered 2021-01-10: 100 mg via INTRAVENOUS

## 2021-01-10 MED ORDER — ROCURONIUM BROMIDE 10 MG/ML (PF) SYRINGE
PREFILLED_SYRINGE | INTRAVENOUS | Status: DC | PRN
  Administered 2021-01-10: 40 mg via INTRAVENOUS

## 2021-01-10 MED ORDER — LIDOCAINE 2% (20 MG/ML) 5 ML SYRINGE
INTRAMUSCULAR | Status: DC | PRN
  Administered 2021-01-10: 60 mg via INTRAVENOUS

## 2021-01-10 MED ORDER — BOOST / RESOURCE BREEZE PO LIQD CUSTOM
1.0000 | Freq: Three times a day (TID) | ORAL | Status: DC
  Administered 2021-01-10: 1 via ORAL

## 2021-01-10 MED ORDER — FENTANYL CITRATE (PF) 100 MCG/2ML IJ SOLN
INTRAMUSCULAR | Status: DC | PRN
  Administered 2021-01-10: 100 ug via INTRAVENOUS

## 2021-01-10 MED ORDER — ONDANSETRON HCL 4 MG/2ML IJ SOLN
INTRAMUSCULAR | Status: DC | PRN
  Administered 2021-01-10: 4 mg via INTRAVENOUS

## 2021-01-10 NOTE — Progress Notes (Signed)
PROGRESS NOTE    Lindsay Hampton  HBZ:[id number] DOB: 1966-05-16 DOA: 01/08/2021 PCP: [name], FNP   Chief Complaint  Patient presents with  . Vomiting    Brief Narrative:  This 55 years old female with PMH significant for left-sided breast cancer status post lumpectomy and undergoing adjuvant chemotherapy with Taxol, anxiety, anemia presents to the ED with c/p RUQ pain associated with nausea and nonbloody nonbilious vomiting that started after having dinner. She is found to have elevated liver enzymes on labs.  RUQ ultrasound shows cholelithiasis without acute cholecystitis, enlarged CBD concerning for choledocholithiasis.  CT abdomen suspicious for pancreatitis.  GI and general surgery was consulted.  Patient underwent laparoscopy cholecystectomy and IOC which was positive.  Patient is scheduled to have ERCP today, 01/10/2021..   Assessment & Plan:   Principal Problem:   RUQ pain Active Problems:   Malignant neoplasm of lower-outer quadrant of left breast of female, estrogen receptor positive ([place])   Hypertensive urgency   Elevated troponin   Acute biliary pancreatitis without infection or necrosis   Choledocholithiasis   RUQ abdominal pain   Hypokalemia   Acute cholecystitis  #1 right upper quadrant and epigastric abdominal pain secondary to acute cholecystitis/choledocholithiasis Patient had presented with right upper quadrant pain with associated nausea and emesis.  Initial concern was for choledocholithiasis and suspected acute mild pancreatitis. -CT abdomen and pelvis done showed mild pancreatitis, cholelithiasis, biliary and gallbladder distention. -Right upper quadrant ultrasound done was consistent with cholelithiasis without acute cholecystitis. -Lipase noted at 21, LFTs elevated and trending up. -General surgery and GI consulted and following. -Patient underwent laparoscopic cholecystectomy with positive IOC on 01/09/2021. -Patient for ERCP today per GI  for further evaluation and management. -Currently NPO. -Continue pain management, IV fluids, empiric IV Zosyn due to concerns for intra-abdominal infection. -Supportive care. -Per GI general surgery.  2.  Left breast cancer Being followed by Dr. Lindi Adie with medical oncology.  Patient noted was due for her chemotherapy 11/08/2021 which will be rescheduled.  Prior MD ( Dr [name]) discussed with Dr. Lindi Adie.  3.  Hypertensive urgency Felt likely secondary to pain.  Blood pressure improved on current regimen of Lopressor..  Continue current pain management.  Labetalol as needed.  4.  Elevated troponin likely secondary to demand ischemia Patient noted to have troponin levels going from 19>> 28>>> 40. -Patient seen in consultation by cardiology, underwent a 2D echo with normal EF, no wall motion abnormalities. -Patient cleared by cardiology for gallbladder surgery.  Patient started on low-dose metoprolol which we will continue.  5.  Hypokalemia Replete.   DVT prophylaxis: Heparin Code Status: Full Family Communication: Updated patient.  No family at bedside. Disposition:   Status is: Inpatient    Dispo: The patient is from: Home              Anticipated d/c is to: Home              Anticipated d/c date is: 1 to 2 days per              Patient currently awaiting to undergo ERCP.  On IV antibiotics.  Not stable for discharge.   Difficult to place patient now       Consultants:   Cardiology: Dr. Doylene Canard 01/08/2021  Gastroenterology: Dr. [name] 01/08/2021  General surgery: Dr. [name] 01/08/2021  Procedures:   CT angiogram chest abdomen and pelvis 01/08/2021  Chest x-ray 01/08/2021  MRCP 01/08/2021  Right upper quadrant ultrasound 01/08/2021  2D  echo 01/08/2021  Laparoscopic cholecystectomy with IOC per Dr.Tsuei 01/09/2021  Antimicrobials:   IV Zosyn 01/08/2021>>>>>   Subjective: Laying in bed feeling a Riemenschneider bit anxious about procedure today.  Complaining of some  soreness around incision sites otherwise overall right upper quadrant and epigastric abdominal pain that she presented with has improved.  No nausea or vomiting.  No shortness of breath.  No chest pain.  Objective: Vitals:   01/09/21 2119 01/10/21 0129 01/10/21 0524 01/10/21 0950  BP: (!) 141/88 129/81 134/87 (!) 155/89  Pulse: 95 93 82 84  Resp: 18 17 17 14   Temp: 98.1 F (36.7 C) 97.8 F (36.6 C) (!) 97.3 F (36.3 C) 97.7 F (36.5 C)  TempSrc: Oral Oral Oral Oral  SpO2: 96% 98% 98% 100%  Weight:      Height:        Intake/Output Summary (Last 24 hours) at 01/10/2021 1129 Last data filed at 01/10/2021 1009 Gross per 24 hour  Intake 570.02 ml  Output 4150 ml  Net -3579.98 ml   Filed Weights   01/08/21 0431  Weight: 99.8 kg    Examination:  General exam: Appears calm and comfortable  Respiratory system: Clear to auscultation. Respiratory effort normal. Cardiovascular system: S1 & S2 heard, RRR. No JVD, murmurs, rubs, gallops or clicks. No pedal edema. Gastrointestinal system: Abdomen is nondistended, soft and some tenderness to palpation around incision sites.  Incision sites C/D/I.  Positive bowel sounds.  No rebound.  No guarding.  Central nervous system: Alert and oriented. No focal neurological deficits. Extremities: Symmetric 5 x 5 power. Skin: No rashes, lesions or ulcers Psychiatry: Judgement and insight appear normal. Mood & affect appropriate.     Data Reviewed: I have personally reviewed following labs and imaging studies  CBC: Recent Labs  Lab 01/08/21 0535 01/09/21 0313 01/10/21 0510  WBC 5.8 3.9* 6.0  NEUTROABS 4.7  --   --   HGB 10.9* 10.0* 9.7*  HCT 33.4* 30.8* 29.8*  MCV 100.6* 102.0* 101.0*  PLT 330 318 784    Basic Metabolic Panel: Recent Labs  Lab 01/08/21 0535 01/09/21 0313 01/10/21 0510  NA 136 138 139  K 3.8 3.6 3.3*  CL 102 103 104  CO2 22 25 24   GLUCOSE 191* 112* 110*  BUN 15 9 6   CREATININE 0.62 0.66 0.73  CALCIUM 9.7 9.1  9.2  MG  --   --  2.0  PHOS  --   --  3.6    GFR: Estimated Creatinine Clearance: 96.9 mL/min (by C-G formula based on SCr of 0.73 mg/dL).  Liver Function Tests: Recent Labs  Lab 01/08/21 0535 01/09/21 0313 01/10/21 0510  AST 104* 142* 122*  ALT 94* 157* 199*  ALKPHOS 74 83 114  BILITOT 2.0* 3.1* 1.5*  PROT 7.2 6.2* 6.3*  ALBUMIN 4.1 3.3* 3.4*    CBG: No results for input(s): GLUCAP in the last 168 hours.   Recent Results (from the past 240 hour(s))  Resp Panel by RT-PCR (Flu A&B, Covid) Nasopharyngeal Swab     Status: None   Collection Time: 01/08/21  6:41 AM   Specimen: Nasopharyngeal Swab; Nasopharyngeal(NP) swabs in vial transport medium  Result Value Ref Range Status   SARS Coronavirus 2 by RT PCR NEGATIVE NEGATIVE Final    Comment: (NOTE) SARS-CoV-2 target nucleic acids are NOT DETECTED.  The SARS-CoV-2 RNA is generally detectable in upper respiratory specimens during the acute phase of infection. The lowest concentration of SARS-CoV-2 viral copies this assay can  detect is 138 copies/mL. A negative result does not preclude SARS-Cov-2 infection and should not be used as the sole basis for treatment or other patient management decisions. A negative result may occur with  improper specimen collection/handling, submission of specimen other than nasopharyngeal swab, presence of viral mutation(s) within the areas targeted by this assay, and inadequate number of viral copies(<138 copies/mL). A negative result must be combined with clinical observations, patient history, and epidemiological information. The expected result is Negative.  Fact Sheet for Patients:  EntrepreneurPulse.com.au  Fact Sheet for Healthcare Providers:  IncredibleEmployment.be  This test is no t yet approved or cleared by the [place] FDA and  has been authorized for detection and/or diagnosis of SARS-CoV-2 by FDA under an Emergency Use Authorization  (EUA). This EUA will remain  in effect (meaning this test can be used) for the duration of the COVID-19 declaration under Section 564(b)(1) of the Act, 21 U.S.C.section 360bbb-3(b)(1), unless the authorization is terminated  or revoked sooner.       Influenza A by PCR NEGATIVE NEGATIVE Final   Influenza B by PCR NEGATIVE NEGATIVE Final    Comment: (NOTE) The Xpert Xpress SARS-CoV-2/FLU/RSV plus assay is intended as an aid in the diagnosis of influenza from Nasopharyngeal swab specimens and should not be used as a sole basis for treatment. Nasal washings and aspirates are unacceptable for Xpert Xpress SARS-CoV-2/FLU/RSV testing.  Fact Sheet for Patients: EntrepreneurPulse.com.au  Fact Sheet for Healthcare Providers: IncredibleEmployment.be  This test is not yet approved or cleared by the [place] FDA and has been authorized for detection and/or diagnosis of SARS-CoV-2 by FDA under an Emergency Use Authorization (EUA). This EUA will remain in effect (meaning this test can be used) for the duration of the COVID-19 declaration under Section 564(b)(1) of the Act, 21 U.S.C. section 360bbb-3(b)(1), unless the authorization is terminated or revoked.  Performed at [facility], [place] [address] [place], [place] [address]          Radiology Studies: DG Cholangiogram Operative  Result Date: 01/09/2021 CLINICAL DATA:  55 year old female with history of cholelithiasis. EXAM: INTRAOPERATIVE CHOLANGIOGRAM COMPARISON:  MRCP from 01/08/2021 FLUOROSCOPY TIME:  Fluoroscopy Time:  20 seconds Number of Acquired Spot Images: 0 FINDINGS: Intraoperative cholangiogram with injection of the remnant cystic duct performed after cholecystectomy demonstrates mid common bile duct medial entry of the cystic duct. There is trifurcation of the biliary confluence (Blumgart type B). There is mild central intrahepatic and extrahepatic biliary ductal  dilation. There are 2 partially mobile punctate filling defects in the distal common bile duct. There is minimal contrast flowing into the duodenum. IMPRESSION: Distal common bile duct choledocholithiasis, partially obstructive. Ruthann Cancer, MD [facility] Radiology Electronically Signed   By: Ruthann Cancer MD   On: 01/09/2021 14:11   MR 3D Recon At Scanner  Result Date: 01/08/2021 CLINICAL DATA:  Right upper quadrant pain. Jaundice. Breast cancer. Evaluate possible pancreatitis. EXAM: MRI ABDOMEN WITHOUT AND WITH CONTRAST (INCLUDING MRCP) TECHNIQUE: Multiplanar multisequence MR imaging of the abdomen was performed both before and after the administration of intravenous contrast. Heavily T2-weighted images of the biliary and pancreatic ducts were obtained, and three-dimensional MRCP images were rendered by post processing. CONTRAST:  61mL GADAVIST GADOBUTROL 1 MMOL/ML IV SOLN COMPARISON:  9 cc Gadavist abdominal ultrasound of earlier today. CTA of the chest, abdomen, and pelvis of earlier today. FINDINGS: Lower chest: Normal heart size without pericardial or pleural effusion. Hepatobiliary: No focal liver lesion. Moderate  intrahepatic biliary duct dilatation. Multiple tiny gallstones without specific evidence of acute cholecystitis. Moderate common duct dilatation, including at 1.0 cm on 15/12. The common duct tapers gradually, without evidence of obstructive stone or mass. Pancreas: Mild peripancreatic edema, including on 21/4. No evidence of pancreatic necrosis, duct dilatation, or well-circumscribed peripancreatic fluid collection. Spleen:  Normal in size, without focal abnormality. Adrenals/Urinary Tract: Normal adrenal glands. Normal kidneys, without hydronephrosis. Stomach/Bowel: Tiny hiatal hernia.  Normal abdominal bowel loops. Vascular/Lymphatic: Aortic atherosclerosis. Retroaortic left renal vein. No retroperitoneal or retrocrural adenopathy. Other:   Trace perihepatic ascites. Musculoskeletal: No acute osseous abnormality. Minimal convex left lumbar spine curvature. IMPRESSION: 1. Cholelithiasis. Moderate intra and extrahepatic biliary duct dilatation, without obstructive stone or mass. Favor recent stone passage. Consider surveillance of bilirubin. If persistently elevated, ERCP should be considered to exclude less likely occult ampullary stenosis. 2. Non complicated mild pancreatitis. 3.  Aortic Atherosclerosis (ICD10-I70.0). 4.  Tiny hiatal hernia. 5. Trace perihepatic ascites. Electronically Signed   By: Abigail Miyamoto M.D.   On: 01/08/2021 12:59   MR ABDOMEN MRCP W WO CONTAST  Result Date: 01/08/2021 CLINICAL DATA:  Right upper quadrant pain. Jaundice. Breast cancer. Evaluate possible pancreatitis. EXAM: MRI ABDOMEN WITHOUT AND WITH CONTRAST (INCLUDING MRCP) TECHNIQUE: Multiplanar multisequence MR imaging of the abdomen was performed both before and after the administration of intravenous contrast. Heavily T2-weighted images of the biliary and pancreatic ducts were obtained, and three-dimensional MRCP images were rendered by post processing. CONTRAST:  47mL GADAVIST GADOBUTROL 1 MMOL/ML IV SOLN COMPARISON:  9 cc Gadavist abdominal ultrasound of earlier today. CTA of the chest, abdomen, and pelvis of earlier today. FINDINGS: Lower chest: Normal heart size without pericardial or pleural effusion. Hepatobiliary: No focal liver lesion. Moderate intrahepatic biliary duct dilatation. Multiple tiny gallstones without specific evidence of acute cholecystitis. Moderate common duct dilatation, including at 1.0 cm on 15/12. The common duct tapers gradually, without evidence of obstructive stone or mass. Pancreas: Mild peripancreatic edema, including on 21/4. No evidence of pancreatic necrosis, duct dilatation, or well-circumscribed peripancreatic fluid collection. Spleen:  Normal in size, without focal abnormality. Adrenals/Urinary Tract: Normal adrenal glands.  Normal kidneys, without hydronephrosis. Stomach/Bowel: Tiny hiatal hernia.  Normal abdominal bowel loops. Vascular/Lymphatic: Aortic atherosclerosis. Retroaortic left renal vein. No retroperitoneal or retrocrural adenopathy. Other:  Trace perihepatic ascites. Musculoskeletal: No acute osseous abnormality. Minimal convex left lumbar spine curvature. IMPRESSION: 1. Cholelithiasis. Moderate intra and extrahepatic biliary duct dilatation, without obstructive stone or mass. Favor recent stone passage. Consider surveillance of bilirubin. If persistently elevated, ERCP should be considered to exclude less likely occult ampullary stenosis. 2. Non complicated mild pancreatitis. 3.  Aortic Atherosclerosis (ICD10-I70.0). 4.  Tiny hiatal hernia. 5. Trace perihepatic ascites. Electronically Signed   By: Abigail Miyamoto M.D.   On: 01/08/2021 12:59   ECHOCARDIOGRAM COMPLETE  Result Date: 01/08/2021    ECHOCARDIOGRAM REPORT   Patient Name:   BRIEANNE MIGNONE [place] Date of Exam: 01/08/2021 Medical Rec #:  [id number]       Height:       66.0 in Accession #:    [id number]      Weight:       220.0 lb Date of Birth:  May 11, 1966       BSA:          2.082 m Patient Age:    54 years        BP:           152/89 mmHg Patient Gender: F  HR:           99 bpm. Exam Location:  Inpatient Procedure: 2D Echo, Cardiac Doppler, Color Doppler, 3D Echo and Strain Analysis Indications:     Z51.11 Encounter for antineoplastic chemotheraphy  History:         Patient has prior history of Echocardiogram examinations, most                  recent 09/08/2020. Risk Factors:Hypertension and GERD. Breast                  Cancer.  Sonographer:     Tiffany Dance Referring Phys:  [place] Diagnosing Phys: Dixie Dials MD IMPRESSIONS  1. GLS Endo Peak A4C: -17 %, A2C is -14.6 % and A3C is -17.7 %.. Left ventricular ejection fraction, by estimation, is 60 to 65%. The left ventricle has normal function. The left ventricle has no regional wall motion  abnormalities. Left ventricular diastolic parameters are consistent with Grade I diastolic dysfunction (impaired relaxation).  2. Right ventricular systolic function is normal. The right ventricular size is normal.  3. Left atrial size was mildly dilated.  4. The mitral valve is degenerative. Mild mitral valve regurgitation.  5. The aortic valve is tricuspid. There is mild calcification of the aortic valve. There is mild thickening of the aortic valve. Aortic valve regurgitation is not visualized.  6. The inferior vena cava is normal in size with greater than 50% respiratory variability, suggesting right atrial pressure of 3 mmHg. Comparison(s): No significant change from prior study. FINDINGS  Left Ventricle: GLS Endo Peak A4C: -17 %, A2C is -14.6 % and A3C is -17.7 %. Left ventricular ejection fraction, by estimation, is 60 to 65%. The left ventricle has normal function. The left ventricle has no regional wall motion abnormalities. The left ventricular internal cavity size was normal in size. There is no left ventricular hypertrophy. Left ventricular diastolic parameters are consistent with Grade I diastolic dysfunction (impaired relaxation). Right Ventricle: The right ventricular size is normal. No increase in right ventricular wall thickness. Right ventricular systolic function is normal. Left Atrium: Left atrial size was mildly dilated. Right Atrium: Right atrial size was normal in size. Pericardium: There is no evidence of pericardial effusion. Mitral Valve: The mitral valve is degenerative in appearance. There is mild calcification of the mitral valve leaflet(s). Mild mitral annular calcification. Mild mitral valve regurgitation. Tricuspid Valve: The tricuspid valve is normal in structure. Tricuspid valve regurgitation is mild. Aortic Valve: The aortic valve is tricuspid. There is mild calcification of the aortic valve. There is mild thickening of the aortic valve. Aortic valve regurgitation is not  visualized. Pulmonic Valve: The pulmonic valve was normal in structure. Pulmonic valve regurgitation is not visualized. Aorta: The aortic root is normal in size and structure. Venous: The inferior vena cava is normal in size with greater than 50% respiratory variability, suggesting right atrial pressure of 3 mmHg. IAS/Shunts: The interatrial septum was not well visualized.  LEFT VENTRICLE PLAX 2D LVIDd:         4.70 cm  Diastology LVIDs:         3.50 cm  LV e' medial:    7.72 cm/s LV PW:         1.50 cm  LV E/e' medial:  10.5 LV IVS:        1.00 cm  LV e' lateral:   7.40 cm/s LVOT diam:     1.90 cm  LV E/e' lateral: 11.0 LV  SV:         56 LV SV Index:   27 LVOT Area:     2.84 cm  RIGHT VENTRICLE             IVC RV Basal diam:  2.50 cm     IVC diam: 1.40 cm RV S prime:     15.10 cm/s TAPSE (M-mode): 2.5 cm LEFT ATRIUM             Index       RIGHT ATRIUM           Index LA diam:        4.30 cm 2.06 cm/m  RA Area:     10.50 cm LA Vol (A2C):   63.7 ml 30.59 ml/m RA Volume:   22.90 ml  11.00 ml/m LA Vol (A4C):   43.1 ml 20.70 ml/m LA Biplane Vol: 53.6 ml 25.74 ml/m  AORTIC VALVE LVOT Vmax:   101.00 cm/s LVOT Vmean:  63.800 cm/s LVOT VTI:    0.197 m  AORTA Ao Root diam: 3.20 cm Ao Asc diam:  3.30 cm MITRAL VALVE MV Area (PHT): 3.37 cm    SHUNTS MV Decel Time: 225 msec    Systemic VTI:  0.20 m MV E velocity: 81.20 cm/s  Systemic Diam: 1.90 cm MV A velocity: 88.60 cm/s MV E/A ratio:  0.92 Dixie Dials MD Electronically signed by Dixie Dials MD Signature Date/Time: 01/08/2021/6:20:56 PM    Final         Scheduled Meds: . heparin  5,000 Units Subcutaneous Q8H  . indomethacin  100 mg Rectal Once  . metoprolol tartrate  12.5 mg Oral BID  . pantoprazole  40 mg Oral Daily   Continuous Infusions: . ampicillin-sulbactam (UNASYN) 1.5 g IVPB (Mini-Bag Plus)    . methocarbamol (ROBAXIN) IV    . piperacillin-tazobactam (ZOSYN)  IV 3.375 g (01/10/21 0539)     LOS: 1 day    Time spent: 35  minutes    Irine Seal, MD [facility]   To contact the attending provider between 7A-7P or the covering provider during after hours 7P-7A, please log into the web site [email or web address] and access using universal [place] password for that web site. If you do not have the password, please call the hospital operator.  01/10/2021, 11:29 AM

## 2021-01-10 NOTE — Progress Notes (Signed)
Initial Nutrition Assessment  DOCUMENTATION CODES:   Obesity unspecified  INTERVENTION:   -Boost Breeze po TID, each supplement provides 250 kcal and 9 grams of protein  NUTRITION DIAGNOSIS:   Increased nutrient needs related to acute illness as evidenced by estimated needs.  GOAL:   Patient will meet greater than or equal to 90% of their needs  MONITOR:   Diet advancement,Labs,Weight trends,I & O's  REASON FOR ASSESSMENT:   Malnutrition Screening Tool    ASSESSMENT:   55 years old female with PMH significant for left-sided breast cancer status post lumpectomy and undergoing adjuvant chemotherapy with Taxol, anxiety, anemia presents to the ED with c/p RUQ pain associated with nausea and nonbloody nonbilious vomiting that started after having dinner. She is found to have elevated liver enzymes on labs.  RUQ ultrasound shows cholelithiasis without acute cholecystitis, enlarged CBD concerning for choledocholithiasis.  CT abdomen suspicious for pancreatitis.  2/15: s/p lap chole with IOC  Patient having ERCP today. Pt now on clears. Was NPO for procedure. Will order Boost Breeze supplements for additional kcals and protein. Pt has been NPO/on clears since admission on 2/14. Pt was having N/V PTA.  Per weight records, insignificant weight changes.  Medications reviewed.  Labs reviewed: Low K  NUTRITION - FOCUSED PHYSICAL EXAM:  Was having ERCP  Diet Order:   Diet Order            Diet clear liquid Room service appropriate? Yes; Fluid consistency: Thin  Diet effective now           Diet NPO time specified Except for: Sips with Meds  Diet effective ____                 EDUCATION NEEDS:   No education needs have been identified at this time  Skin:  Skin Assessment: Reviewed RN Assessment  Last BM:  2/13  Height:   Ht Readings from Last 1 Encounters:  01/10/21 5\' 6"  (1.676 m)    Weight:   Wt Readings from Last 1 Encounters:  01/10/21 99.8 kg   BMI:   Body mass index is 35.51 kg/m.  Estimated Nutritional Needs:   Kcal:  1700-1900  Protein:  75-90g  Fluid:  1.9L/day  Clayton Bibles, MS, RD, LDN Inpatient Clinical Dietitian Contact information available via Amion

## 2021-01-10 NOTE — Anesthesia Procedure Notes (Signed)
Date/Time: 01/10/2021 2:21 PM Performed by: Cynda Familia, CRNA Oxygen Delivery Method: Simple face mask Placement Confirmation: positive ETCO2 and breath sounds checked- equal and bilateral Dental Injury: Teeth and Oropharynx as per pre-operative assessment

## 2021-01-10 NOTE — Op Note (Signed)
[facility] Patient Name: Lindsay Hampton Procedure Date: 01/10/2021 MRN: [id number] Attending MD: Ladene Artist , MD Date of Birth: [birth year] CSN: [id number] Age: 55 Admit Type: Inpatient Procedure:                ERCP Indications:              Bile duct stone(s), Filling defect on                            intraoperative cholangiogram, Elevated liver enzymes Providers:                Pricilla Riffle. Fuller Plan, MD, Cleda Daub, RN, Cherylynn Ridges, Technician, Barrett Henle, CRNA Referring MD:             Storm Frisk, MD Medicines:                Monitored Anesthesia Care Complications:            No immediate complications. Estimated Blood Loss:     Estimated blood loss: none. Procedure:                Pre-Anesthesia Assessment:                           - Prior to the procedure, a History and Physical                            was performed, and patient medications and                            allergies were reviewed. The patient's tolerance of                            previous anesthesia was also reviewed. The risks                            and benefits of the procedure and the sedation                            options and risks were discussed with the patient.                            All questions were answered, and informed consent                            was obtained. Prior Anticoagulants: The patient has                            taken no previous anticoagulant or antiplatelet                            agents. ASA Grade Assessment: II - A patient with  mild systemic disease. After reviewing the risks                            and benefits, the patient was deemed in                            satisfactory condition to undergo the procedure.                           After obtaining informed consent, the scope was                            passed under direct vision. Throughout the                             procedure, the patient's blood pressure, pulse, and                            oxygen saturations were monitored continuously. The                            ERCP [id number] was introduced through the mouth, and                            used to inject contrast into and used to inject                            contrast into the bile duct. The ERCP was                            accomplished without difficulty. The patient                            tolerated the procedure well. Scope In: Scope Out: Findings:      A scout film of the abdomen was obtained. Surgical clips, consistent       with a previous cholecystectomy, were seen in the area of the right       upper quadrant of the abdomen. The esophagus was successfully intubated       under direct vision. The scope was advanced to a normal major papilla in       the descending duodenum without detailed examination of the pharynx,       larynx and associated structures, and upper GI tract. The upper GI tract       was grossly normal. A straight Roadrunner wire was passed into the       biliary tree. The short-nosed traction sphincterotome was passed over       the guidewire and the bile duct was then deeply cannulated. Contrast was       injected. I personally interpreted the bile duct images. There was       appropriate flow of contrast through the ducts. The common bile duct was       mildly dilated and diffusely dilated, acquired. The largest diameter was       9 mm. A cholecystectomy had been performed. The intrahepatic ducts  and       cystic duct appeared normal. An 8 mm biliary sphincterotomy was made       with a traction (standard) sphincterotome using ERBE electrocautery.       There was no post-sphincterotomy bleeding. The biliary tree was swept       with a 9-12 mm balloon starting at the bifurcation several times.       Nothing was found. The PD was not cannulated or injected by intention. Impression:                - The common bile duct was mildly dilated, acquired.                           - Prior cholecystectomy.                           - A biliary sphincterotomy was performed.                           - The biliary tree was swept and nothing was found. Moderate Sedation:      Not Applicable - Patient had care per Anesthesia. Recommendation:           - Avoid aspirin and nonsteroidal anti-inflammatory                            medicines for 1 week.                           - Return patient to hospital ward for ongoing care.                           - Observe patient's clinical course following                            today's ERCP with therapeutic intervention.                           - Trend LFTs.                           - Suspect she passed the stones that were noted on                            IOC yesterday. Procedure Code(s):        --- Professional ---                           [phone], Endoscopic retrograde                            cholangiopancreatography (ERCP); with                            sphincterotomy/papillotomy Diagnosis Code(s):        --- Professional ---                           Z90.49, Acquired  absence of other specified parts                            of digestive tract                           K80.50, Calculus of bile duct without cholangitis                            or cholecystitis without obstruction                           R74.8, Abnormal levels of other serum enzymes                           K83.8, Other specified diseases of biliary tract                           R93.2, Abnormal findings on diagnostic imaging of                            liver and biliary tract CPT copyright 2019 [employer]. All rights reserved. The codes documented in this report are preliminary and upon coder review may  be revised to meet current compliance requirements. Ladene Artist, MD 01/10/2021 2:20:29 PM This report has been signed  electronically. Number of Addenda: 0

## 2021-01-10 NOTE — Transfer of Care (Signed)
Immediate Anesthesia Transfer of Care Note  Patient: Lindsay Hampton  Procedure(s) Performed: ENDOSCOPIC RETROGRADE CHOLANGIOPANCREATOGRAPHY (ERCP) (N/A )  Patient Location: PACU and Endoscopy Unit  Anesthesia Type:General  Level of Consciousness: awake and alert   Airway & Oxygen Therapy: Patient Spontanous Breathing and Patient connected to face mask oxygen  Post-op Assessment: Report given to RN and Post -op Vital signs reviewed and stable  Post vital signs: Reviewed and stable  Last Vitals:  Vitals Value Taken Time  BP    Temp    Pulse    Resp    SpO2      Last Pain:  Vitals:   01/10/21 1303  TempSrc: Oral  PainSc: 0-No pain      Patients Stated Pain Goal: 2 (81/84/03 7543)  Complications: No complications documented.

## 2021-01-10 NOTE — Progress Notes (Signed)
1 Day Post-Op   Subjective/Chief Complaint: Patient is comfortable, no nausea  T. Bili - 1.5 today, down from 3.1.   Cholangiogram showed choledocholithiasis with partial obstruction ERCP scheduled today with Dr. Fuller Plan   Objective: Vital signs in last 24 hours: Temp:  [97.3 F (36.3 C)-98.3 F (36.8 C)] 97.3 F (36.3 C) (02/16 0524) Pulse Rate:  [81-103] 82 (02/16 0524) Resp:  [8-26] 17 (02/16 0524) BP: (129-154)/(80-102) 134/87 (02/16 0524) SpO2:  [96 %-100 %] 98 % (02/16 0524) Last BM Date: 01/07/21  Intake/Output from previous day: 02/15 0701 - 02/16 0700 In: 1617.6 [P.O.:220; I.V.:1200; IV Piggyback:197.6] Out: 4160 [Urine:4150; Blood:10] Intake/Output this shift: No intake/output data recorded.  WDWN in NAD No obvious jaundice Abd - soft, minimally tender Incisions c/d/i  Lab Results:  Recent Labs    01/09/21 0313 01/10/21 0510  WBC 3.9* 6.0  HGB 10.0* 9.7*  HCT 30.8* 29.8*  PLT 318 311   BMET Recent Labs    01/09/21 0313 01/10/21 0510  NA 138 139  K 3.6 3.3*  CL 103 104  CO2 25 24  GLUCOSE 112* 110*  BUN 9 6  CREATININE 0.66 0.73  CALCIUM 9.1 9.2   Hepatic Function Latest Ref Rng & Units 01/10/2021 01/09/2021 01/08/2021  Total Protein 6.5 - 8.1 g/dL 6.3(L) 6.2(L) 7.2  Albumin 3.5 - 5.0 g/dL 3.4(L) 3.3(L) 4.1  AST 15 - 41 U/L 122(H) 142(H) 104(H)  ALT 0 - 44 U/L 199(H) 157(H) 94(H)  Alk Phosphatase 38 - 126 U/L 114 83 74  Total Bilirubin 0.3 - 1.2 mg/dL 1.5(H) 3.1(H) 2.0(H)  Bilirubin, Direct 0.00 - 0.40 mg/dL - - -    PT/INR No results for input(s): LABPROT, INR in the last 72 hours. ABG No results for input(s): PHART, HCO3 in the last 72 hours.  Invalid input(s): PCO2, PO2  Studies/Results: DG Cholangiogram Operative  Result Date: 01/09/2021 CLINICAL DATA:  55 year old female with history of cholelithiasis. EXAM: INTRAOPERATIVE CHOLANGIOGRAM COMPARISON:  MRCP from 01/08/2021 FLUOROSCOPY TIME:  Fluoroscopy Time:  20 seconds Number of  Acquired Spot Images: 0 FINDINGS: Intraoperative cholangiogram with injection of the remnant cystic duct performed after cholecystectomy demonstrates mid common bile duct medial entry of the cystic duct. There is trifurcation of the biliary confluence (Blumgart type B). There is mild central intrahepatic and extrahepatic biliary ductal dilation. There are 2 partially mobile punctate filling defects in the distal common bile duct. There is minimal contrast flowing into the duodenum. IMPRESSION: Distal common bile duct choledocholithiasis, partially obstructive. Lindsay Cancer, [facility] Electronically Signed   By: Lindsay Cancer Hampton   On: 01/09/2021 14:11   MR 3D Recon At Scanner  Result Date: 01/08/2021 CLINICAL DATA:  Right upper quadrant pain. Jaundice. Breast Hampton. Evaluate possible pancreatitis. EXAM: MRI ABDOMEN WITHOUT AND WITH CONTRAST (INCLUDING MRCP) TECHNIQUE: Multiplanar multisequence MR imaging of the abdomen was performed both before and after the administration of intravenous contrast. Heavily T2-weighted images of the biliary and pancreatic ducts were obtained, and three-dimensional MRCP images were rendered by post processing. CONTRAST:  14m GADAVIST GADOBUTROL 1 MMOL/ML IV SOLN COMPARISON:  9 cc Gadavist abdominal ultrasound of earlier today. CTA of the chest, abdomen, and pelvis of earlier today. FINDINGS: Lower chest: Normal heart size without pericardial or pleural effusion. Hepatobiliary: No focal liver lesion. Moderate intrahepatic biliary duct dilatation. Multiple tiny gallstones without specific evidence of acute cholecystitis. Moderate common duct dilatation, including at 1.0 cm on 15/12. The common duct tapers gradually,  without evidence of obstructive stone or mass. Pancreas: Mild peripancreatic edema, including on 21/4. No evidence of pancreatic necrosis, duct dilatation, or well-circumscribed peripancreatic fluid  collection. Spleen:  Normal in size, without focal abnormality. Adrenals/Urinary Tract: Normal adrenal glands. Normal kidneys, without hydronephrosis. Stomach/Bowel: Tiny hiatal hernia.  Normal abdominal bowel loops. Vascular/Lymphatic: Aortic atherosclerosis. Retroaortic left renal vein. No retroperitoneal or retrocrural adenopathy. Other:  Trace perihepatic ascites. Musculoskeletal: No acute osseous abnormality. Minimal convex left lumbar spine curvature. IMPRESSION: 1. Cholelithiasis. Moderate intra and extrahepatic biliary duct dilatation, without obstructive stone or mass. Favor recent stone passage. Consider surveillance of bilirubin. If persistently elevated, ERCP should be considered to exclude less likely occult ampullary stenosis. 2. Non complicated mild pancreatitis. 3.  Aortic Atherosclerosis (ICD10-I70.0). 4.  Tiny hiatal hernia. 5. Trace perihepatic ascites. Electronically Signed   By: Lindsay Miyamoto M.D.   On: 01/08/2021 12:59   MR ABDOMEN MRCP W WO CONTAST  Result Date: 01/08/2021 CLINICAL DATA:  Right upper quadrant pain. Jaundice. Breast Hampton. Evaluate possible pancreatitis. EXAM: MRI ABDOMEN WITHOUT AND WITH CONTRAST (INCLUDING MRCP) TECHNIQUE: Multiplanar multisequence MR imaging of the abdomen was performed both before and after the administration of intravenous contrast. Heavily T2-weighted images of the biliary and pancreatic ducts were obtained, and three-dimensional MRCP images were rendered by post processing. CONTRAST:  9m GADAVIST GADOBUTROL 1 MMOL/ML IV SOLN COMPARISON:  9 cc Gadavist abdominal ultrasound of earlier today. CTA of the chest, abdomen, and pelvis of earlier today. FINDINGS: Lower chest: Normal heart size without pericardial or pleural effusion. Hepatobiliary: No focal liver lesion. Moderate intrahepatic biliary duct dilatation. Multiple tiny gallstones without specific evidence of acute cholecystitis. Moderate common duct dilatation, including at 1.0 cm on 15/12. The  common duct tapers gradually, without evidence of obstructive stone or mass. Pancreas: Mild peripancreatic edema, including on 21/4. No evidence of pancreatic necrosis, duct dilatation, or well-circumscribed peripancreatic fluid collection. Spleen:  Normal in size, without focal abnormality. Adrenals/Urinary Tract: Normal adrenal glands. Normal kidneys, without hydronephrosis. Stomach/Bowel: Tiny hiatal hernia.  Normal abdominal bowel loops. Vascular/Lymphatic: Aortic atherosclerosis. Retroaortic left renal vein. No retroperitoneal or retrocrural adenopathy. Other:  Trace perihepatic ascites. Musculoskeletal: No acute osseous abnormality. Minimal convex left lumbar spine curvature. IMPRESSION: 1. Cholelithiasis. Moderate intra and extrahepatic biliary duct dilatation, without obstructive stone or mass. Favor recent stone passage. Consider surveillance of bilirubin. If persistently elevated, ERCP should be considered to exclude less likely occult ampullary stenosis. 2. Non complicated mild pancreatitis. 3.  Aortic Atherosclerosis (ICD10-I70.0). 4.  Tiny hiatal hernia. 5. Trace perihepatic ascites. Electronically Signed   By: Lindsay MiyamotoM.D.   On: 01/08/2021 12:59   ECHOCARDIOGRAM COMPLETE  Result Date: 01/08/2021    ECHOCARDIOGRAM REPORT   Patient Name:   Lindsay Hampton Date of Exam: 01/08/2021 Medical Rec #:  [id number]      Height:       66.0 in Accession #:    [id number]     Weight:       220.0 lb Date of Birth:  707-09-1966      BSA:          2.082 m Patient Age:    89years        BP:           152/89 mmHg Patient Gender: F               HR:           99 bpm. Exam Location:  Inpatient Procedure:  2D Echo, Cardiac Doppler, Color Doppler, 3D Echo and Strain Analysis Indications:     Z51.11 Encounter for antineoplastic chemotheraphy  History:         Patient has prior history of Echocardiogram examinations, most                  recent 09/08/2020. Risk Factors:Hypertension and GERD. Breast                   Hampton.  Sonographer:     Tiffany Dance Referring Phys:  Dewey Diagnosing Phys: Lindsay Hampton IMPRESSIONS  1. GLS Endo Peak A4C: -17 %, A2C is -14.6 % and A3C is -17.7 %.. Left ventricular ejection fraction, by estimation, is 60 to 65%. The left ventricle has normal function. The left ventricle has no regional wall motion abnormalities. Left ventricular diastolic parameters are consistent with Grade I diastolic dysfunction (impaired relaxation).  2. Right ventricular systolic function is normal. The right ventricular size is normal.  3. Left atrial size was mildly dilated.  4. The mitral valve is degenerative. Mild mitral valve regurgitation.  5. The aortic valve is tricuspid. There is mild calcification of the aortic valve. There is mild thickening of the aortic valve. Aortic valve regurgitation is not visualized.  6. The inferior vena cava is normal in size with greater than 50% respiratory variability, suggesting right atrial pressure of 3 mmHg. Comparison(s): No significant change from prior study. FINDINGS  Left Ventricle: GLS Endo Peak A4C: -17 %, A2C is -14.6 % and A3C is -17.7 %. Left ventricular ejection fraction, by estimation, is 60 to 65%. The left ventricle has normal function. The left ventricle has no regional wall motion abnormalities. The left ventricular internal cavity size was normal in size. There is no left ventricular hypertrophy. Left ventricular diastolic parameters are consistent with Grade I diastolic dysfunction (impaired relaxation). Right Ventricle: The right ventricular size is normal. No increase in right ventricular wall thickness. Right ventricular systolic function is normal. Left Atrium: Left atrial size was mildly dilated. Right Atrium: Right atrial size was normal in size. Pericardium: There is no evidence of pericardial effusion. Mitral Valve: The mitral valve is degenerative in appearance. There is mild calcification of the mitral valve leaflet(s). Mild mitral  annular calcification. Mild mitral valve regurgitation. Tricuspid Valve: The tricuspid valve is normal in structure. Tricuspid valve regurgitation is mild. Aortic Valve: The aortic valve is tricuspid. There is mild calcification of the aortic valve. There is mild thickening of the aortic valve. Aortic valve regurgitation is not visualized. Pulmonic Valve: The pulmonic valve was normal in structure. Pulmonic valve regurgitation is not visualized. Aorta: The aortic root is normal in size and structure. Venous: The inferior vena cava is normal in size with greater than 50% respiratory variability, suggesting right atrial pressure of 3 mmHg. IAS/Shunts: The interatrial septum was not well visualized.  LEFT VENTRICLE PLAX 2D LVIDd:         4.70 cm  Diastology LVIDs:         3.50 cm  LV e' medial:    7.72 cm/s LV PW:         1.50 cm  LV E/e' medial:  10.5 LV IVS:        1.00 cm  LV e' lateral:   7.40 cm/s LVOT diam:     1.90 cm  LV E/e' lateral: 11.0 LV SV:         56 LV SV Index:   27 LVOT Area:  2.84 cm  RIGHT VENTRICLE             IVC RV Basal diam:  2.50 cm     IVC diam: 1.40 cm RV S prime:     15.10 cm/s TAPSE (M-mode): 2.5 cm LEFT ATRIUM             Index       RIGHT ATRIUM           Index LA diam:        4.30 cm 2.06 cm/m  RA Area:     10.50 cm LA Vol (A2C):   63.7 ml 30.59 ml/m RA Volume:   22.90 ml  11.00 ml/m LA Vol (A4C):   43.1 ml 20.70 ml/m LA Biplane Vol: 53.6 ml 25.74 ml/m  AORTIC VALVE LVOT Vmax:   101.00 cm/s LVOT Vmean:  63.800 cm/s LVOT VTI:    0.197 m  AORTA Ao Root diam: 3.20 cm Ao Asc diam:  3.30 cm MITRAL VALVE MV Area (PHT): 3.37 cm    SHUNTS MV Decel Time: 225 msec    Systemic VTI:  0.20 m MV E velocity: 81.20 cm/s  Systemic Diam: 1.90 cm MV A velocity: 88.60 cm/s MV E/A ratio:  0.92 Lindsay Hampton Electronically signed by Lindsay Hampton Signature Date/Time: 01/08/2021/6:20:56 PM    Final     Anti-infectives: Anti-infectives (From admission, onward)   Start     Dose/Rate Route  Frequency Ordered Stop   01/10/21 1330  ampicillin-sulbactam (UNASYN) 1.5 g in sodium chloride 0.9 % 100 mL IVPB        1.5 g 200 mL/hr over 30 Minutes Intravenous On call 01/09/21 1323 01/11/21 1330   01/08/21 1400  piperacillin-tazobactam (ZOSYN) IVPB 3.375 g        3.375 g 12.5 mL/hr over 240 Minutes Intravenous Every 8 hours 01/08/21 0844     01/08/21 0745  piperacillin-tazobactam (ZOSYN) IVPB 3.375 g        3.375 g 100 mL/hr over 30 Minutes Intravenous  Once 01/08/21 0732 01/08/21 0813      Assessment/Plan: s/p Procedure(s): LAPAROSCOPIC CHOLECYSTECTOMY WITH INTRAOPERATIVE CHOLANGIOGRAM (N/A) IOC showed distal CBD stones GI planning ERCP today Probable discharge tomorrow F/U - Hailea Eaglin  LOS: 1 day    Lindsay Hampton 01/10/2021

## 2021-01-10 NOTE — Anesthesia Procedure Notes (Signed)
Procedure Name: Intubation Date/Time: 01/10/2021 1:38 PM Performed by: Cynda Familia, CRNA Pre-anesthesia Checklist: Patient identified, Emergency Drugs available, Suction available and Patient being monitored Patient Re-evaluated:Patient Re-evaluated prior to induction Oxygen Delivery Method: Circle System Utilized Preoxygenation: Pre-oxygenation with 100% oxygen Induction Type: IV induction and Rapid sequence Ventilation: Mask ventilation without difficulty Laryngoscope Size: Miller and 2 Tube type: Oral Tube size: 7.0 mm Number of attempts: 1 Airway Equipment and Method: Stylet Placement Confirmation: ETT inserted through vocal cords under direct vision,  positive ETCO2 and breath sounds checked- equal and bilateral Secured at: 22 cm Tube secured with: Tape Dental Injury: Teeth and Oropharynx as per pre-operative assessment  Comments: Smooth IV induction Finucane-- intubation AM CRNA ataruamtic-- teeth and mouth as preop-- chipping on front teeth right worse then left-- unchanged after laryngoscopy bilat BS Finucane

## 2021-01-10 NOTE — Anesthesia Preprocedure Evaluation (Addendum)
Anesthesia Evaluation  Patient identified by MRN, date of birth, ID band Patient awake    Reviewed: Allergy & Precautions, NPO status , Patient's Chart, lab work & pertinent test results  Airway Mallampati: I  TM Distance: >3 FB Neck ROM: Full    Dental no notable dental hx. (+) Teeth Intact, Dental Advisory Given   Pulmonary neg pulmonary ROS,    Pulmonary exam normal breath sounds clear to auscultation       Cardiovascular hypertension (not on meds at home, has been hypertensive in the hospital during this visit), Normal cardiovascular exam Rhythm:Regular Rate:Normal     Neuro/Psych PSYCHIATRIC DISORDERS Anxiety negative neurological ROS     GI/Hepatic GERD  Medicated and Controlled,Choledocholithiasis    Endo/Other  Obesity BMI 35  Renal/GU negative Renal ROS  negative genitourinary   Musculoskeletal negative musculoskeletal ROS (+)   Abdominal   Peds  Hematology  (+) Blood dyscrasia (9.7/29.8), anemia ,   Anesthesia Other Findings   Reproductive/Obstetrics negative OB ROS                           Anesthesia Physical Anesthesia Plan  ASA: II  Anesthesia Plan: General   Post-op Pain Management:    Induction: Intravenous  PONV Risk Score and Plan: 3 and Ondansetron, Dexamethasone, Midazolam and Treatment may vary due to age or medical condition  Airway Management Planned: Oral ETT  Additional Equipment: None  Intra-op Plan:   Post-operative Plan: Extubation in OR  Informed Consent: I have reviewed the patients History and Physical, chart, labs and discussed the procedure including the risks, benefits and alternatives for the proposed anesthesia with the patient or authorized representative who has indicated his/her understanding and acceptance.     Dental advisory given  Plan Discussed with: CRNA  Anesthesia Plan Comments:        Anesthesia Quick Evaluation

## 2021-01-10 NOTE — Progress Notes (Signed)
Patient for ERCP today for treatment of CBD stones. On Zosyn. She is NPO. Feels okay. Some improvement in liver chemistries overnight.  WBC 6. She is afebrile. SQ TID heparin discontinued. She has no additional questions about ERCP

## 2021-01-10 NOTE — Anesthesia Postprocedure Evaluation (Signed)
Anesthesia Post Note  Patient: Roxane [place]  Procedure(s) Performed: ENDOSCOPIC RETROGRADE CHOLANGIOPANCREATOGRAPHY (ERCP) (N/A ) SPHINCTEROTOMY     Patient location during evaluation: PACU Anesthesia Type: General Level of consciousness: awake and alert, oriented and patient cooperative Pain management: pain level controlled Vital Signs Assessment: post-procedure vital signs reviewed and stable Respiratory status: spontaneous breathing, nonlabored ventilation and respiratory function stable Cardiovascular status: blood pressure returned to baseline and stable Postop Assessment: no apparent nausea or vomiting Anesthetic complications: no   No complications documented.  Last Vitals:  Vitals:   01/10/21 1429 01/10/21 1430  BP: (!) 150/92 (!) 150/92  Pulse: 94 95  Resp: 17 14  Temp:    SpO2: 96% 97%    Last Pain:  Vitals:   01/10/21 1430  TempSrc:   PainSc: 0-No pain                 Pervis Hocking

## 2021-01-10 NOTE — Interval H&P Note (Signed)
History and Physical Interval Note:  01/10/2021 1:32 PM  Lindsay Hampton  has presented today for surgery, with the diagnosis of choledocholithiasis.  The various methods of treatment have been discussed with the patient and family. After consideration of risks, benefits and other options for treatment, the patient has consented to  Procedure(s): ENDOSCOPIC RETROGRADE CHOLANGIOPANCREATOGRAPHY (ERCP) (N/A) as a surgical intervention.  The patient's history has been reviewed, patient examined, no change in status, stable for surgery.  I have reviewed the patient's chart and labs.  Questions were answered to the patient's satisfaction.     Pricilla Riffle. Fuller Plan

## 2021-01-11 DIAGNOSIS — R7989 Other specified abnormal findings of blood chemistry: Secondary | ICD-10-CM | POA: Diagnosis not present

## 2021-01-11 DIAGNOSIS — R1011 Right upper quadrant pain: Secondary | ICD-10-CM | POA: Diagnosis not present

## 2021-01-11 DIAGNOSIS — K810 Acute cholecystitis: Secondary | ICD-10-CM | POA: Diagnosis not present

## 2021-01-11 DIAGNOSIS — R932 Abnormal findings on diagnostic imaging of liver and biliary tract: Secondary | ICD-10-CM

## 2021-01-11 DIAGNOSIS — K8050 Calculus of bile duct without cholangitis or cholecystitis without obstruction: Secondary | ICD-10-CM | POA: Diagnosis not present

## 2021-01-11 LAB — CBC
HCT: 31 % — ABNORMAL LOW (ref 36.0–46.0)
Hemoglobin: 10.1 g/dL — ABNORMAL LOW (ref 12.0–15.0)
MCH: 32.9 pg (ref 26.0–34.0)
MCHC: 32.6 g/dL (ref 30.0–36.0)
MCV: 101 fL — ABNORMAL HIGH (ref 80.0–100.0)
Platelets: 321 10*3/uL (ref 150–400)
RBC: 3.07 MIL/uL — ABNORMAL LOW (ref 3.87–5.11)
RDW: 14.7 % (ref 11.5–15.5)
WBC: 6.7 10*3/uL (ref 4.0–10.5)
nRBC: 0.3 % — ABNORMAL HIGH (ref 0.0–0.2)

## 2021-01-11 LAB — COMPREHENSIVE METABOLIC PANEL
ALT: 160 U/L — ABNORMAL HIGH (ref 0–44)
AST: 73 U/L — ABNORMAL HIGH (ref 15–41)
Albumin: 3.5 g/dL (ref 3.5–5.0)
Alkaline Phosphatase: 101 U/L (ref 38–126)
Anion gap: 7 (ref 5–15)
BUN: 7 mg/dL (ref 6–20)
CO2: 26 mmol/L (ref 22–32)
Calcium: 9.3 mg/dL (ref 8.9–10.3)
Chloride: 107 mmol/L (ref 98–111)
Creatinine, Ser: 0.62 mg/dL (ref 0.44–1.00)
GFR, Estimated: >60 mL/min (ref >60)
Glucose, Bld: 128 mg/dL — ABNORMAL HIGH (ref 70–99)
Potassium: 3.9 mmol/L (ref 3.5–5.1)
Sodium: 140 mmol/L (ref 135–145)
Total Bilirubin: 0.9 mg/dL (ref 0.3–1.2)
Total Protein: 6.3 g/dL — ABNORMAL LOW (ref 6.5–8.1)

## 2021-01-11 LAB — MAGNESIUM: Magnesium: 2 mg/dL (ref 1.7–2.4)

## 2021-01-11 MED ORDER — HEPARIN SOD (PORK) LOCK FLUSH 100 UNIT/ML IV SOLN
500.0000 [IU] | INTRAVENOUS | Status: AC | PRN
  Administered 2021-01-11: 500 [IU]
  Filled 2021-01-11: qty 5

## 2021-01-11 MED ORDER — ACETAMINOPHEN 325 MG PO TABS: 650.0000 mg | ORAL_TABLET | Freq: Four times a day (QID) | ORAL | Status: DC | PRN

## 2021-01-11 MED ORDER — ACETAMINOPHEN-CODEINE #3 300-30 MG PO TABS: 1.0000 | ORAL_TABLET | Freq: Four times a day (QID) | ORAL | 0 refills | Status: DC | PRN

## 2021-01-11 MED ORDER — ACETAMINOPHEN 325 MG PO TABS: ORAL_TABLET | ORAL | Status: DC

## 2021-01-11 MED ORDER — METOPROLOL TARTRATE 25 MG PO TABS: 12.5000 mg | ORAL_TABLET | Freq: Two times a day (BID) | ORAL | 1 refills | Status: DC

## 2021-01-11 MED ORDER — ACETAMINOPHEN-CODEINE #3 300-30 MG PO TABS: 1.0000 | ORAL_TABLET | Freq: Four times a day (QID) | ORAL | Status: DC | PRN

## 2021-01-11 MED ORDER — PANTOPRAZOLE SODIUM 40 MG PO TBEC: 40.0000 mg | DELAYED_RELEASE_TABLET | Freq: Every day | ORAL | 1 refills | Status: DC

## 2021-01-11 NOTE — Progress Notes (Signed)
Progress Note  Chief Complaint:   Abdominal pain / pancreatitis       ASSESSMENT / PLAN:    # 55 yo female with cholelithiasis and / choledocholithiasis / mild pancreatitis. MRCP was negative but had positive IOC at time of lap cholecystectomy. She underwent ERCP with sphincterotomy yesterday. Bile duct swept but no stones / sludge found so she must have passed the previously seen bile duct stone(s).  --Reports feeling great this am. Diet being advanced and says Surgery is okay with her going home.  --Liver chemistries continues to improve. AST 73, ALT 160, alk phos and bilirubin now normal --Low fat diet at discharge  # Elevated troponin. Cardiology following and feels secondary to ischemia  # Left breast cancer, undergoing chemotherapy       SUBJECTIVE:    feels great. No abdominal pain      OBJECTIVE:    Scheduled inpatient medications:  . feeding supplement  1 Container Oral TID BM  . metoprolol tartrate  12.5 mg Oral BID  . pantoprazole  40 mg Oral Daily   Continuous inpatient infusions:  PRN inpatient medications: acetaminophen, acetaminophen-codeine, morphine injection, ondansetron **OR** ondansetron (ZOFRAN) IV, oxyCODONE, sodium chloride flush  Vital signs in last 24 hours: Temp:  [97.8 F (36.6 C)-98.7 F (37.1 C)] 98.2 F (36.8 C) (02/17 0740) Pulse Rate:  [73-95] 73 (02/17 0740) Resp:  [12-18] 16 (02/17 0740) BP: (120-185)/(72-98) 136/89 (02/17 0740) SpO2:  [96 %-100 %] 98 % (02/17 0524) Weight:  [99.8 kg] 99.8 kg (02/16 1303) Last BM Date: 01/10/21  Intake/Output Summary (Last 24 hours) at 01/11/2021 1030 Last data filed at 01/11/2021 0600 Gross per 24 hour  Intake 1339.96 ml  Output 2600 ml  Net -1260.04 ml     Physical Exam:  . General: Alert female in NAD . Heart:  Regular rate and rhythm. No lower extremity edema . Pulmonary: Normal respiratory effort . Abdomen: Soft, nondistended, nontender. Normal bowel sounds.  . Neurologic: Alert  and oriented . Psych: Pleasant. Cooperative.   Filed Weights   01/08/21 0431 01/10/21 1303  Weight: 99.8 kg 99.8 kg    Intake/Output from previous day: 02/16 0701 - 02/17 0700 In: 1385.6 [P.O.:480; I.V.:800; IV Piggyback:105.6] Out: 2800 [Urine:2800] Intake/Output this shift: No intake/output data recorded.    Lab Results: Recent Labs    01/09/21 0313 01/10/21 0510 01/11/21 0300  WBC 3.9* 6.0 6.7  HGB 10.0* 9.7* 10.1*  HCT 30.8* 29.8* 31.0*  PLT 318 311 321   BMET Recent Labs    01/09/21 0313 01/10/21 0510 01/11/21 0300  NA 138 139 140  K 3.6 3.3* 3.9  CL 103 104 107  CO2 25 24 26   GLUCOSE 112* 110* 128*  BUN 9 6 7   CREATININE 0.66 0.73 0.62  CALCIUM 9.1 9.2 9.3   LFT Recent Labs    01/11/21 0300  PROT 6.3*  ALBUMIN 3.5  AST 73*  ALT 160*  ALKPHOS 101  BILITOT 0.9   PT/INR No results for input(s): LABPROT, INR in the last 72 hours. Hepatitis Panel No results for input(s): HEPBSAG, HCVAB, HEPAIGM, HEPBIGM in the last 72 hours.  DG Cholangiogram Operative  Result Date: 01/09/2021 CLINICAL DATA:  55 year old female with history of cholelithiasis. EXAM: INTRAOPERATIVE CHOLANGIOGRAM COMPARISON:  MRCP from 01/08/2021 FLUOROSCOPY TIME:  Fluoroscopy Time:  20 seconds Number of Acquired Spot Images: 0 FINDINGS: Intraoperative cholangiogram with injection of the remnant cystic duct performed after cholecystectomy demonstrates mid common bile duct medial entry of  the cystic duct. There is trifurcation of the biliary confluence (Blumgart type B). There is mild central intrahepatic and extrahepatic biliary ductal dilation. There are 2 partially mobile punctate filling defects in the distal common bile duct. There is minimal contrast flowing into the duodenum. IMPRESSION: Distal common bile duct choledocholithiasis, partially obstructive. Ruthann Cancer, MD [facility] Electronically Signed   By: Ruthann Cancer MD    On: 01/09/2021 14:11   DG ERCP With Sphincterotomy  Result Date: 01/10/2021 CLINICAL DATA:  Cholelithiasis. Jaundice. Right upper quadrant pain. EXAM: ERCP TECHNIQUE: Multiple spot images obtained with the fluoroscopic device and submitted for interpretation post-procedure. FLUOROSCOPY TIME:  Fluoroscopy Time:  1 minutes 44 seconds Radiation Exposure Index (if provided by the fluoroscopic device): Unknown Number of Acquired Spot Images: 0 COMPARISON:  Cholangiogram 01/09/2021 FINDINGS: Ten intraoperative fluoroscopic images are submitted for interpretation. The submitted images demonstrate cannulation and opacification of the intra and extrahepatic bile duct with balloon sweep. No filling defects identified within the common bile duct on the final image. IMPRESSION: ERCP as above. These images were submitted for radiologic interpretation only. Please see the procedural report for the amount of contrast and the fluoroscopy time utilized. Electronically Signed   By: Miachel Roux M.D.   On: 01/10/2021 14:23      Principal Problem:   RUQ pain Active Problems:   Malignant neoplasm of lower-outer quadrant of left breast of female, estrogen receptor positive ([place])   Hypertensive urgency   Elevated troponin   Acute biliary pancreatitis without infection or necrosis   Choledocholithiasis   RUQ abdominal pain   Hypokalemia   Acute cholecystitis   Abnormal cholangiogram   Elevated LFTs     LOS: 2 days   Tye Savoy ,NP 01/11/2021, 10:30 AM

## 2021-01-11 NOTE — Progress Notes (Signed)
1 Day Post-Op    CC: Abdominal pain  Subjective: She is doing well this morning she still on clear liquids will advance her diet.  She has not taken hardly anything for pain.  She does have issues with oxycodone, she had an ERCP yesterday.  She wants to go home on plain Tylenol and we are going to try some Tylenol#3, here.  Her port sites look fine.  She feels much better. Objective: Vital signs in last 24 hours: Temp:  [97.7 F (36.5 C)-98.7 F (37.1 C)] 98.2 F (36.8 C) (02/17 0740) Pulse Rate:  [73-95] 73 (02/17 0740) Resp:  [12-18] 16 (02/17 0740) BP: (120-185)/(72-98) 136/89 (02/17 0740) SpO2:  [96 %-100 %] 98 % (02/17 0524) Weight:  [99.8 kg] 99.8 kg (02/16 1303) Last BM Date: 01/10/21 480 p.o. 905 IV 2800 urine No BM Afebrile, vital signs are stable Glucose 128, LFTs improved bilirubin down to 0.9 CMP Latest Ref Rng & Units 01/11/2021 01/10/2021 01/09/2021  Total Bilirubin 0.3 - 1.2 mg/dL 0.9 1.5(H) 3.1(H)  Alkaline Phos 38 - 126 U/L 101 114 83  AST 15 - 41 U/L 73(H) 122(H) 142(H)  ALT 0 - 44 U/L 160(H) 199(H) 157(H)  WBC 6.7 H/H 10.1/31.0 Platelets 321,000  Intake/Output from previous day: 02/16 0701 - 02/17 0700 In: 1385.6 [P.O.:480; I.V.:800; IV Piggyback:105.6] Out: 2800 [Urine:2800] Intake/Output this shift: No intake/output data recorded.  General appearance: alert, cooperative and no distress Resp: clear to auscultation bilaterally GI: Soft, sore, port sites look good.  Did have a bowel movement yesterday.  Lab Results:  Recent Labs    01/10/21 0510 01/11/21 0300  WBC 6.0 6.7  HGB 9.7* 10.1*  HCT 29.8* 31.0*  PLT 311 321    BMET Recent Labs    01/10/21 0510 01/11/21 0300  NA 139 140  K 3.3* 3.9  CL 104 107  CO2 24 26  GLUCOSE 110* 128*  BUN 6 7  CREATININE 0.73 0.62  CALCIUM 9.2 9.3   PT/INR No results for input(s): LABPROT, INR in the last 72 hours.  Recent Labs  Lab 01/08/21 0535 01/09/21 0313 01/10/21 0510 01/11/21 0300  AST  104* 142* 122* 73*  ALT 94* 157* 199* 160*  ALKPHOS 74 83 114 101  BILITOT 2.0* 3.1* 1.5* 0.9  PROT 7.2 6.2* 6.3* 6.3*  ALBUMIN 4.1 3.3* 3.4* 3.5     Lipase     Component Value Date/Time   LIPASE 56 (H) 01/10/2021 0510     Medications: . feeding supplement  1 Container Oral TID BM  . indomethacin  100 mg Rectal Once  . metoprolol tartrate  12.5 mg Oral BID  . pantoprazole  40 mg Oral Daily    Assessment/Plan Left breast cancer S/p lumpectomy/chemotherapy Adriamycin/Cytoxan  Abdominal pain/cholelithiasis/CBD 10 mm MRCP 2/14: Multiple tiny gallstones without evidence of cholecystitis/moderate common bile duct dilatation of 10 mm, no evidence of obstructive stone or mass.  Mild peripancreatic edema no evidence of necrosis ductal dilatation or well-circumscribed peripancreatic fluid collection. Laparoscopic cholecystectomy with IOC -with obstruction 01/09/2021 Dr. Donnie Mesa POD #2 ERCP/sphincterotomy 01/10/2021, Dr. Lucio Edward, POD #1  FEN:  Clears tonight/NPO after MN/IV fluids ID: [email or web address] 2/14 >> DVT: heparin  Plan: From our standpoint she can go home today.  She can have plain Tylenol, and Tylenol# 3 for pain.  She has follow-up with our office and to recheck her LFTs in the AVS.    LOS: 2 days    Riordan Walle 01/11/2021 Please see Amion

## 2021-01-11 NOTE — Consult Note (Signed)
Ref: [name], FNP   Subjective:  Awake. Decreasing abdominal discomfort. VS stable. Liver function tests are improving. S/P GB surgery and ERCP with sphincterotomy/papillotomy yesterday.  Objective:  Vital Signs in the last 24 hours: Temp:  [97.7 F (36.5 C)-98.7 F (37.1 C)] 98.2 F (36.8 C) (02/17 0740) Pulse Rate:  [73-95] 73 (02/17 0740) Resp:  [12-18] 16 (02/17 0740) BP: (120-185)/(72-98) 136/89 (02/17 0740) SpO2:  [96 %-100 %] 98 % (02/17 0524) Weight:  [99.8 kg] 99.8 kg (02/16 1303)  Physical Exam: BP Readings from Last 1 Encounters:  01/11/21 136/89     Wt Readings from Last 1 Encounters:  01/10/21 99.8 kg    Weight change:  Body mass index is 35.51 kg/m. HEENT: [place]/AT, Eyes-Blue, Conjunctiva-Pink, Sclera-Non-icteric Neck: No JVD, No bruit, Trachea midline. Lungs:  Clear, Bilateral. Cardiac:  Regular rhythm, normal S1 and S2, no S3. II/VI systolic murmur. Abdomen:  Soft, mildly-tender. BS present. Extremities:  No edema present. No cyanosis. No clubbing. CNS: AxOx3, Cranial nerves grossly intact, moves all 4 extremities.  Skin: Warm and dry.   Intake/Output from previous day: 02/16 0701 - 02/17 0700 In: 1385.6 [P.O.:480; I.V.:800; IV Piggyback:105.6] Out: 2800 [Urine:2800]    Lab Results: BMET    Component Value Date/Time   NA 140 01/11/2021 0300   NA 139 01/10/2021 0510   NA 138 01/09/2021 0313   NA 139 12/15/2017 1515   K 3.9 01/11/2021 0300   K 3.3 (L) 01/10/2021 0510   K 3.6 01/09/2021 0313   CL 107 01/11/2021 0300   CL 104 01/10/2021 0510   CL 103 01/09/2021 0313   CO2 26 01/11/2021 0300   CO2 24 01/10/2021 0510   CO2 25 01/09/2021 0313   GLUCOSE 128 (H) 01/11/2021 0300   GLUCOSE 110 (H) 01/10/2021 0510   GLUCOSE 112 (H) 01/09/2021 0313   BUN 7 01/11/2021 0300   BUN 6 01/10/2021 0510   BUN 9 01/09/2021 0313   BUN 14 12/15/2017 1515   CREATININE 0.62 01/11/2021 0300   CREATININE 0.73 01/10/2021 0510   CREATININE 0.66  01/09/2021 0313   CREATININE 0.73 01/01/2021 0910   CREATININE 0.64 12/25/2020 1315   CREATININE 0.81 12/18/2020 1331   CREATININE 0.94 12/17/2016 1248   CREATININE 0.76 08/03/2014 1021   CALCIUM 9.3 01/11/2021 0300   CALCIUM 9.2 01/10/2021 0510   CALCIUM 9.1 01/09/2021 0313   GFRNONAA >60 01/11/2021 0300   GFRNONAA >60 01/10/2021 0510   GFRNONAA >60 01/09/2021 0313   GFRNONAA >60 01/01/2021 0910   GFRNONAA >60 12/25/2020 1315   GFRNONAA >60 12/18/2020 1331   GFRAA >60 08/14/2020 0958   GFRAA >60 07/26/2020 0801   GFRAA 113 12/15/2017 1515   CBC    Component Value Date/Time   WBC 6.7 01/11/2021 0300   RBC 3.07 (L) 01/11/2021 0300   HGB 10.1 (L) 01/11/2021 0300   HGB 10.7 (L) 01/01/2021 0910   HGB 13.1 12/15/2017 1515   HCT 31.0 (L) 01/11/2021 0300   HCT 39.2 12/15/2017 1515   PLT 321 01/11/2021 0300   PLT 278 01/01/2021 0910   PLT 360 12/15/2017 1515   MCV 101.0 (H) 01/11/2021 0300   MCV 93 12/15/2017 1515   MCH 32.9 01/11/2021 0300   MCHC 32.6 01/11/2021 0300   RDW 14.7 01/11/2021 0300   RDW 13.4 12/15/2017 1515   LYMPHSABS 0.7 01/08/2021 0535   MONOABS 0.3 01/08/2021 0535   EOSABS 0.0 01/08/2021 0535   BASOSABS 0.1 01/08/2021 0535   HEPATIC Function  Panel Recent Labs    01/09/21 0313 01/10/21 0510 01/11/21 0300  PROT 6.2* 6.3* 6.3*   HEMOGLOBIN A1C No components found for: HGA1C,  MPG CARDIAC ENZYMES No results found for: CKTOTAL, CKMB, CKMBINDEX, TROPONINI BNP No results for input(s): PROBNP in the last 8760 hours. TSH No results for input(s): TSH in the last 8760 hours. CHOLESTEROL No results for input(s): CHOL in the last 8760 hours.  Scheduled Meds: . feeding supplement  1 Container Oral TID BM  . metoprolol tartrate  12.5 mg Oral BID  . pantoprazole  40 mg Oral Daily   Continuous Infusions: PRN Meds:.acetaminophen, acetaminophen-codeine, morphine injection, ondansetron **OR** ondansetron (ZOFRAN) IV, oxyCODONE, sodium chloride  flush  Assessment/Plan: Cholelithiasis and choledocolithiasis S/P GB surgery  S/P ERCP Abnormal troponin I from demand ischemia Left breast cancer, s/p chemotherapy Hyperglycemia Obesity HTN  F/U in 2 weeks. Keep f/u with surgery and GI as arranged.   LOS: 2 days   Time spent including chart review, lab review, examination, discussion with patient :  min   Dixie Dials  MD  01/11/2021, 9:49 AM

## 2021-01-11 NOTE — Discharge Summary (Signed)
Physician Discharge Summary  Lindsay Hampton MWU:[id number] DOB: Feb 03, 1966 DOA: 01/08/2021  PCP: Lindsay Salvage, FNP  Admit date: 01/08/2021 Discharge date: 01/11/2021  Time spent: 55 minutes  Recommendations for Outpatient Follow-up:  1. Follow-up with Dr.Tsuei, general surgery on 01/30/2021.  On follow-up will need LFTs done. 2. Follow-up with Dr. Doylene Canard, cardiology in 2 weeks. 3. Follow-up with Dr. Lindi Hampton, hematology/oncology as scheduled. 4. Follow-up with Lindsay Salvage, FNP in 2 weeks.  On follow-up patient's blood pressure need to be reassessed.  Patient will need a comprehensive metabolic profile done to follow-up on electrolytes, renal function, LFTs.   Discharge Diagnoses:  Principal Problem:   RUQ pain Active Problems:   Choledocholithiasis   Acute cholecystitis   Malignant neoplasm of lower-outer quadrant of left breast of female, estrogen receptor positive (HCC)   Hypertensive urgency   Elevated troponin   Acute biliary pancreatitis without infection or necrosis   RUQ abdominal pain   Hypokalemia   Abnormal cholangiogram   Elevated LFTs   Discharge Condition: Stable and improved  Diet recommendation: Low-fat diet  Filed Weights   01/08/21 0431 01/10/21 1303  Weight: 99.8 kg 99.8 kg    History of present illness:  HPI per Dr. [name] This is a 55 year old female with past medical history of left-sided breast cancer s/p lumpectomy and undergoing adjuvant chemotherapy with Taxol, anxiety, anemia presented to the ED with nausea and nonbloody, nonbilious vomiting that began last night after dinner.  With associated right-sided mid back pain with radiation to RUQ. Had spaghetti and meatballs for dinner.  Attempted to treat symptoms with home Zofran around 2100 without improvement. No known history of gallstones. Denies any fever, chills, night sweats, diarrhea or any other complaints.  Currently feeling improved after receiving pain meds in the  ED.  Screening Colonoscopy with Dr. [name] 02/24/2018: 4 polyps removed (no high grade dysplasia or malignancy), diverticulosis, internal hemorrhoids. Recommended follow up colonoscopy in 3-5 years.   ED Course: Afebrile, tachycardic, hypertensive, on room air. Notable Labs: Sodium 136, K3.8, glucose 191, BUN 15, creatinine 0.62, lipase 25, alk phos 74, AST 104, ALT 94, T bili 2.0, WBC 5.8, Hb 10.9, platelets 330, troponin 19, COVID-19 and flu negative. Notable Imaging: CXR partially visualized foci of gas in the RUQ.  RUQ [place] cholelithiasis without acute cholecystitis, enlarged CBD concerning for choledocholithiasis.  CTA chest abdomen pelvis suspicious for pancreatitis, cholelithiasis with biliary and gallbladder distention but no detected choledocholithiasis.  [facility] was consulted, patient started on Zosyn.    Hospital Course:  1 right upper quadrant and epigastric abdominal pain secondary to acute cholecystitis/choledocholithiasis Patient had presented with right upper quadrant pain with associated nausea and emesis.  Initial concern was for choledocholithiasis and suspected acute mild pancreatitis. -CT abdomen and pelvis done showed mild pancreatitis, cholelithiasis, biliary and gallbladder distention. -Right upper quadrant ultrasound done was consistent with cholelithiasis without acute cholecystitis. -Lipase noted at 21, LFTs elevated and trended back up postoperatively.   -General surgery and GI consulted and following. -Patient underwent laparoscopic cholecystectomy with positive IOC on 01/09/2021. -Patient subsequently underwent ERCP with sphincterotomy 01/10/2021, bile duct was swept but no stones/sludge found it was felt patient may have passed previously seen common bile duct stone.  Patient tolerated procedure well.  -Patient improved clinically with improvement with LFTs.  Patient also hydrated IV fluids.  Patient was on empiric IV Zosyn.   -Patient initially placed on clear  liquids post ERCP and subsequently advanced to a regular diet which she tolerated.   -  Abdominal pain improved.   -Patient was discharged home in stable and improved condition with outpatient follow-up with general surgery, Dr. [name].  -Patient be discharged on Tylenol # 3 as needed as recommended per general surgery.    2.  Left breast cancer Being followed by Dr. Lindi Hampton with medical oncology.  Patient noted was due for her chemotherapy 11/08/2021 which will be rescheduled.  Prior MD ( Dr [name]) discussed with Dr. Lindi Hampton.  Outpatient follow-up with oncology.  3.  Hypertensive urgency Felt likely secondary to pain.  Blood pressure improved on oral Lopressor.  Outpatient follow-up with PCP and cardiology.   4.  Elevated troponin likely secondary to demand ischemia Patient noted to have troponin levels going from 19>> 28>>> 40. -Patient seen in consultation by cardiology, Dr. Doylene Canard, underwent a 2D echo with normal EF, no wall motion abnormalities. -Patient cleared by cardiology for gallbladder surgery.  Patient started on low-dose metoprolol. -Outpatient follow-up with cardiology.  5.  Hypokalemia Repleted.  Outpatient follow-up.  Procedures:  CT angiogram chest abdomen and pelvis 01/08/2021  Chest x-ray 01/08/2021  MRCP 01/08/2021  Right upper quadrant ultrasound 01/08/2021  2D echo 01/08/2021  Laparoscopic cholecystectomy with IOC per Dr.Tsuei 01/09/2021  ERCP per Dr. Fuller Plan 01/10/2021   Consultations:  Cardiology: Dr. Doylene Canard 01/08/2021  Gastroenterology: Dr. [name] 01/08/2021  General surgery: Dr. [name] 01/08/2021    Discharge Exam: Vitals:   01/11/21 0740 01/11/21 1201  BP: 136/89 (!) 148/95  Pulse: 73 77  Resp: 16 17  Temp: 98.2 F (36.8 C) 98.7 F (37.1 C)  SpO2:      General: NAD Cardiovascular: RRR Respiratory: CTAB  Discharge Instructions   Discharge Instructions    Diet - low sodium heart healthy   Complete by: As directed    Low fat diet    Increase activity slowly   Complete by: As directed      Allergies as of 01/11/2021   No Known Allergies     Medication List    TAKE these medications   acetaminophen 325 MG tablet Commonly known as: TYLENOL You can take 2 tablets every 4 hours as needed for pain.  Do not take over 4000 mg of Tylenol/acetaminophen per day.  Tylenol#3 is Tylenol and codeine.  You need to count each of these tablets as part of your daily maximum of Tylenol.   acetaminophen-codeine 300-30 MG tablet Commonly known as: TYLENOL #3 Take 1 tablet by mouth every 6 (six) hours as needed for severe pain (pain not relieved by plain Tylenol).   ALPRAZolam 0.25 MG tablet Commonly known as: XANAX Take 1 tablet (0.25 mg total) by mouth 2 (two) times daily as needed for anxiety.   dexamethasone 4 MG tablet Commonly known as: DECADRON Take 1 tablet (4 mg total) by mouth as needed (daily as needed for nausea).   lidocaine-prilocaine cream Commonly known as: EMLA Apply to affected area once What changed:   how much to take  how to take this  when to take this  reasons to take this  additional instructions   metoprolol tartrate 25 MG tablet Commonly known as: LOPRESSOR Take 0.5 tablets (12.5 mg total) by mouth 2 (two) times daily.   ondansetron 8 MG disintegrating tablet Commonly known as: ZOFRAN-ODT TAKE 1 TABLET BY MOUTH EVERY 8 HOURS AS NEEDED FOR NAUSEA OR VOMITING. What changed: See the new instructions.   pantoprazole 40 MG tablet Commonly known as: PROTONIX Take 1 tablet (40 mg total) by mouth daily. Start taking on: January 12, 2021   prochlorperazine 10 MG tablet Commonly known as: COMPAZINE Take 1 tablet (10 mg total) by mouth every 6 (six) hours as needed (Nausea or vomiting).   sodium chloride 0.65 % Soln nasal spray Commonly known as: OCEAN Place 1 spray into both nostrils as needed for congestion.      No Known Allergies  Follow-up Information    Donnie Mesa, MD. Go on  01/30/2021.   Specialty: General Surgery Why: Your appointment is 3/8 at 1:40pm Please arrive 15 minutes prior to your appointment to check in and fill out paperwork. Bring photo ID and insurance information. Contact information: [place] [address] [phone]        [facility] Follow up on 01/29/2021.   Why: Go for labs test to test your liver functions before you see [place] on 01/30/21. Contact information: [address] floor [place]       [place], [place], MD. Schedule an appointment as soon as possible for a visit in 2 week(s).   Specialty: Cardiology Contact information: [place] [address] [phone]        [name], MD Follow up.   Specialty: Hematology and Oncology Why: Follow-up as scheduled. Contact information: [place] [address] [place], [place], [place]. Schedule an appointment as soon as possible for a visit in 2 week(s).   Specialty: Internal Medicine Contact information: [place] [address] [phone]                The results of significant diagnostics from this hospitalization (including imaging, microbiology, ancillary and laboratory) are listed below for reference.    Significant Diagnostic Studies: DG Cholangiogram Operative  Result Date: 01/09/2021 CLINICAL DATA:  55 year old female with history of cholelithiasis. EXAM: INTRAOPERATIVE CHOLANGIOGRAM COMPARISON:  MRCP from 01/08/2021 FLUOROSCOPY TIME:  Fluoroscopy Time:  20 seconds Number of Acquired Spot Images: 0 FINDINGS: Intraoperative cholangiogram with injection of the remnant cystic duct performed after cholecystectomy demonstrates mid common bile duct medial entry of the cystic duct. There is trifurcation of the biliary confluence (Blumgart type B). There is mild central intrahepatic and extrahepatic biliary ductal dilation. There are 2  partially mobile punctate filling defects in the distal common bile duct. There is minimal contrast flowing into the duodenum. IMPRESSION: Distal common bile duct choledocholithiasis, partially obstructive. Ruthann Cancer, MD [facility] Radiology Electronically Signed   By: Ruthann Cancer MD   On: 01/09/2021 14:11   MR 3D Recon At Scanner  Result Date: 01/08/2021 CLINICAL DATA:  Right upper quadrant pain. Jaundice. Breast cancer. Evaluate possible pancreatitis. EXAM: MRI ABDOMEN WITHOUT AND WITH CONTRAST (INCLUDING MRCP) TECHNIQUE: Multiplanar multisequence MR imaging of the abdomen was performed both before and after the administration of intravenous contrast. Heavily T2-weighted images of the biliary and pancreatic ducts were obtained, and three-dimensional MRCP images were rendered by post processing. CONTRAST:  33m GADAVIST GADOBUTROL 1 MMOL/ML IV SOLN COMPARISON:  9 cc Gadavist abdominal ultrasound of earlier today. CTA of the chest, abdomen, and pelvis of earlier today. FINDINGS: Lower chest: Normal heart size without pericardial or pleural effusion. Hepatobiliary: No focal liver lesion. Moderate intrahepatic biliary duct dilatation. Multiple tiny gallstones without specific evidence of acute cholecystitis. Moderate common duct dilatation, including at 1.0 cm on 15/12. The common duct tapers gradually, without evidence of obstructive stone or mass. Pancreas: Mild peripancreatic edema,  including on 21/4. No evidence of pancreatic necrosis, duct dilatation, or well-circumscribed peripancreatic fluid collection. Spleen:  Normal in size, without focal abnormality. Adrenals/Urinary Tract: Normal adrenal glands. Normal kidneys, without hydronephrosis. Stomach/Bowel: Tiny hiatal hernia.  Normal abdominal bowel loops. Vascular/Lymphatic: Aortic atherosclerosis. Retroaortic left renal vein. No retroperitoneal or retrocrural adenopathy. Other:  Trace perihepatic  ascites. Musculoskeletal: No acute osseous abnormality. Minimal convex left lumbar spine curvature. IMPRESSION: 1. Cholelithiasis. Moderate intra and extrahepatic biliary duct dilatation, without obstructive stone or mass. Favor recent stone passage. Consider surveillance of bilirubin. If persistently elevated, ERCP should be considered to exclude less likely occult ampullary stenosis. 2. Non complicated mild pancreatitis. 3.  Aortic Atherosclerosis (ICD10-I70.0). 4.  Tiny hiatal hernia. 5. Trace perihepatic ascites. Electronically Signed   By: Abigail Miyamoto M.D.   On: 01/08/2021 12:59   DG Chest Portable 1 View  Result Date: 01/08/2021 CLINICAL DATA:  Chest pain, vomiting, upper back EXAM: PORTABLE CHEST 1 VIEW COMPARISON:  Chest x-ray 09/12/2020. FINDINGS: Right chest wall Port-A-Cath with tip overlying the expected region of the superior cavoatrial junction. The heart size and mediastinal contours are within normal limits. No focal consolidation. No pulmonary edema. No pleural effusion. No pneumothorax. No acute osseous abnormality. Partially visualized foci of gas within the right upper quadrant of unclear etiology. IMPRESSION: 1. No active disease. 2. Partially visualized foci of gas within the right upper quadrant of unclear etiology. Finding likely represents gas within the large bowel. Recommend x-ray abdomen for further evaluation. Electronically Signed   By: Iven Finn M.D.   On: 01/08/2021 05:56   DG ERCP With Sphincterotomy  Result Date: 01/10/2021 CLINICAL DATA:  Cholelithiasis. Jaundice. Right upper quadrant pain. EXAM: ERCP TECHNIQUE: Multiple spot images obtained with the fluoroscopic device and submitted for interpretation post-procedure. FLUOROSCOPY TIME:  Fluoroscopy Time:  1 minutes 44 seconds Radiation Exposure Index (if provided by the fluoroscopic device): Unknown Number of Acquired Spot Images: 0 COMPARISON:  Cholangiogram 01/09/2021 FINDINGS: Ten intraoperative fluoroscopic  images are submitted for interpretation. The submitted images demonstrate cannulation and opacification of the intra and extrahepatic bile duct with balloon sweep. No filling defects identified within the common bile duct on the final image. IMPRESSION: ERCP as above. These images were submitted for radiologic interpretation only. Please see the procedural report for the amount of contrast and the fluoroscopy time utilized. Electronically Signed   By: Miachel Roux M.D.   On: 01/10/2021 14:23   MR ABDOMEN MRCP W WO CONTAST  Result Date: 01/08/2021 CLINICAL DATA:  Right upper quadrant pain. Jaundice. Breast cancer. Evaluate possible pancreatitis. EXAM: MRI ABDOMEN WITHOUT AND WITH CONTRAST (INCLUDING MRCP) TECHNIQUE: Multiplanar multisequence MR imaging of the abdomen was performed both before and after the administration of intravenous contrast. Heavily T2-weighted images of the biliary and pancreatic ducts were obtained, and three-dimensional MRCP images were rendered by post processing. CONTRAST:  78m GADAVIST GADOBUTROL 1 MMOL/ML IV SOLN COMPARISON:  9 cc Gadavist abdominal ultrasound of earlier today. CTA of the chest, abdomen, and pelvis of earlier today. FINDINGS: Lower chest: Normal heart size without pericardial or pleural effusion. Hepatobiliary: No focal liver lesion. Moderate intrahepatic biliary duct dilatation. Multiple tiny gallstones without specific evidence of acute cholecystitis. Moderate common duct dilatation, including at 1.0 cm on 15/12. The common duct tapers gradually, without evidence of obstructive stone or mass. Pancreas: Mild peripancreatic edema, including on 21/4. No evidence of pancreatic necrosis, duct dilatation, or well-circumscribed peripancreatic fluid collection. Spleen:  Normal in size, without focal abnormality. Adrenals/Urinary Tract: Normal  adrenal glands. Normal kidneys, without hydronephrosis. Stomach/Bowel: Tiny hiatal hernia.  Normal abdominal bowel loops.  Vascular/Lymphatic: Aortic atherosclerosis. Retroaortic left renal vein. No retroperitoneal or retrocrural adenopathy. Other:  Trace perihepatic ascites. Musculoskeletal: No acute osseous abnormality. Minimal convex left lumbar spine curvature. IMPRESSION: 1. Cholelithiasis. Moderate intra and extrahepatic biliary duct dilatation, without obstructive stone or mass. Favor recent stone passage. Consider surveillance of bilirubin. If persistently elevated, ERCP should be considered to exclude less likely occult ampullary stenosis. 2. Non complicated mild pancreatitis. 3.  Aortic Atherosclerosis (ICD10-I70.0). 4.  Tiny hiatal hernia. 5. Trace perihepatic ascites. Electronically Signed   By: Abigail Miyamoto M.D.   On: 01/08/2021 12:59   ECHOCARDIOGRAM COMPLETE  Result Date: 01/08/2021    ECHOCARDIOGRAM REPORT   Patient Name:   Lindsay Hampton Date of Exam: 01/08/2021 Medical Rec #:  [id number]       Height:       66.0 in Accession #:    [id number]      Weight:       220.0 lb Date of Birth:  [birth year]       BSA:          2.082 m Patient Age:    55 years        BP:           152/89 mmHg Patient Gender: F               HR:           99 bpm. Exam Location:  Inpatient Procedure: 2D Echo, Cardiac Doppler, Color Doppler, 3D Echo and Strain Analysis Indications:     Z51.11 Encounter for antineoplastic chemotheraphy  History:         Patient has prior history of Echocardiogram examinations, most                  recent 09/08/2020. Risk Factors:Hypertension and GERD. Breast                  Cancer.  Sonographer:     Tiffany Dance Referring Phys:  [place] Diagnosing Phys: Dixie Dials MD IMPRESSIONS  1. GLS Endo Peak A4C: -17 %, A2C is -14.6 % and A3C is -17.7 %.. Left ventricular ejection fraction, by estimation, is 60 to 65%. The left ventricle has normal function. The left ventricle has no regional wall motion abnormalities. Left ventricular diastolic parameters are consistent with Grade I diastolic dysfunction  (impaired relaxation).  2. Right ventricular systolic function is normal. The right ventricular size is normal.  3. Left atrial size was mildly dilated.  4. The mitral valve is degenerative. Mild mitral valve regurgitation.  5. The aortic valve is tricuspid. There is mild calcification of the aortic valve. There is mild thickening of the aortic valve. Aortic valve regurgitation is not visualized.  6. The inferior vena cava is normal in size with greater than 50% respiratory variability, suggesting right atrial pressure of 3 mmHg. Comparison(s): No significant change from prior study. FINDINGS  Left Ventricle: GLS Endo Peak A4C: -17 %, A2C is -14.6 % and A3C is -17.7 %. Left ventricular ejection fraction, by estimation, is 60 to 65%. The left ventricle has normal function. The left ventricle has no regional wall motion abnormalities. The left ventricular internal cavity size was normal in size. There is no left ventricular hypertrophy. Left ventricular diastolic parameters are consistent with Grade I diastolic dysfunction (impaired relaxation). Right Ventricle: The right ventricular size is normal. No increase in right ventricular  wall thickness. Right ventricular systolic function is normal. Left Atrium: Left atrial size was mildly dilated. Right Atrium: Right atrial size was normal in size. Pericardium: There is no evidence of pericardial effusion. Mitral Valve: The mitral valve is degenerative in appearance. There is mild calcification of the mitral valve leaflet(s). Mild mitral annular calcification. Mild mitral valve regurgitation. Tricuspid Valve: The tricuspid valve is normal in structure. Tricuspid valve regurgitation is mild. Aortic Valve: The aortic valve is tricuspid. There is mild calcification of the aortic valve. There is mild thickening of the aortic valve. Aortic valve regurgitation is not visualized. Pulmonic Valve: The pulmonic valve was normal in structure. Pulmonic valve regurgitation is not  visualized. Aorta: The aortic root is normal in size and structure. Venous: The inferior vena cava is normal in size with greater than 50% respiratory variability, suggesting right atrial pressure of 3 mmHg. IAS/Shunts: The interatrial septum was not well visualized.  LEFT VENTRICLE PLAX 2D LVIDd:         4.70 cm  Diastology LVIDs:         3.50 cm  LV e' medial:    7.72 cm/s LV PW:         1.50 cm  LV E/e' medial:  10.5 LV IVS:        1.00 cm  LV e' lateral:   7.40 cm/s LVOT diam:     1.90 cm  LV E/e' lateral: 11.0 LV SV:         56 LV SV Index:   27 LVOT Area:     2.84 cm  RIGHT VENTRICLE             IVC RV Basal diam:  2.50 cm     IVC diam: 1.40 cm RV S prime:     15.10 cm/s TAPSE (M-mode): 2.5 cm LEFT ATRIUM             Index       RIGHT ATRIUM           Index LA diam:        4.30 cm 2.06 cm/m  RA Area:     10.50 cm LA Vol (A2C):   63.7 ml 30.59 ml/m RA Volume:   22.90 ml  11.00 ml/m LA Vol (A4C):   43.1 ml 20.70 ml/m LA Biplane Vol: 53.6 ml 25.74 ml/m  AORTIC VALVE LVOT Vmax:   101.00 cm/s LVOT Vmean:  63.800 cm/s LVOT VTI:    0.197 m  AORTA Ao Root diam: 3.20 cm Ao Asc diam:  3.30 cm MITRAL VALVE MV Area (PHT): 3.37 cm    SHUNTS MV Decel Time: 225 msec    Systemic VTI:  0.20 m MV E velocity: 81.20 cm/s  Systemic Diam: 1.90 cm MV A velocity: 88.60 cm/s MV E/A ratio:  0.92 Dixie Dials MD Electronically signed by Dixie Dials MD Signature Date/Time: 01/08/2021/6:20:56 PM    Final    CT Angio Chest/Abd/Pel for Dissection W and/or Wo Contrast  Result Date: 01/08/2021 CLINICAL DATA:  Left-sided breast cancer undergoing chemotherapy. Vomiting and upper back pain EXAM: CT ANGIOGRAPHY CHEST, ABDOMEN AND PELVIS TECHNIQUE: Non-contrast CT of the chest was initially obtained. Multidetector CT imaging through the chest, abdomen and pelvis was performed using the standard protocol during bolus administration of intravenous contrast. Multiplanar reconstructed images and MIPs were obtained and reviewed to evaluate  the vascular anatomy. CONTRAST:  148m OMNIPAQUE IOHEXOL 350 MG/ML SOLN COMPARISON:  None. FINDINGS: CTA CHEST FINDINGS Cardiovascular: Normal heart size. No  pericardial effusion. No aortic dissection, aneurysm, or intramural hematoma. Porta catheter on the right with tip in good position at the upper cavoatrial junction. Mediastinum/Nodes: Negative for adenopathy or mass. Postoperative left breast. Lungs/Pleura: The central airways are clear. There is no edema, consolidation, effusion, or pneumothorax. Musculoskeletal: Patchy areas of thoracic spine sclerosis which are adjacent endplates and appear reactive. No acute finding. Review of the MIP images confirms the above findings. CTA ABDOMEN AND PELVIS FINDINGS VASCULAR Aorta: Normal caliber aorta without aneurysm, dissection, vasculitis or significant stenosis. Celiac: Patent without evidence of aneurysm, dissection, vasculitis or significant stenosis. SMA: Patent without evidence of aneurysm, dissection, vasculitis or significant stenosis. Renals: Unremarkable IMA: Patent Inflow: Unremarkable Veins: Unremarkable in the venous phase Review of the MIP images confirms the above findings. NON-VASCULAR Hepatobiliary: No focal liver abnormality.Distended gallbladder and bile ducts. The common bile duct measures up to 1 cm in diameter. No visible choledocholithiasis. There is a calcified gallstone. Pancreas: Mild stranding around the pancreas, especially at the body and tail. No fluid collection or detectable necrosis. Spleen: Unremarkable. Adrenals/Urinary Tract: Negative adrenals. No hydronephrosis or stone. Unremarkable bladder. Stomach/Bowel:  No obstruction. No appendicitis. Lymphatic: No mass or adenopathy. Reproductive:2.4 cm subserosal fibroid. Other: No ascites or pneumoperitoneum. Musculoskeletal: No acute abnormalities. Review of the MIP images confirms the above findings. IMPRESSION: Suspect mild acute edematous pancreatitis. There is cholelithiasis with  biliary and gallbladder distension but no detected choledocholithiasis, consider MRCP. Normal CTA of the aorta. Electronically Signed   By: Monte Fantasia M.D.   On: 01/08/2021 07:28   US Abdomen Limited RUQ (LIVER/GB)  Result Date: 01/08/2021 CLINICAL DATA:  Right upper quadrant pain X 1 day. History of breast cancer. EXAM: ULTRASOUND ABDOMEN LIMITED RIGHT UPPER QUADRANT COMPARISON:  None. FINDINGS: Gallbladder: 8 mm calcified gallstone noted within the gallbladder lumen. No pericholecystic fluid or wall thickening visualized. No sonographic Murphy sign noted by sonographer. Intrahepatic biliary ducts are measuring at the upper limits of normal. Common bile duct: Diameter: Dilated measuring up to 10 mm. Liver: No focal lesion identified. Within normal limits in parenchymal echogenicity. Portal vein is patent on color Doppler imaging with normal direction of blood flow towards the liver. Other: None. IMPRESSION: 1. Cholelithiasis with no findings of acute cholecystitis. 2. Enlarged common bile duct concerning for choledocholithiasis. Correlate with liver function tests, and if clinically indicated consider MRCP for further evaluation. Electronically Signed   By: Iven Finn M.D.   On: 01/08/2021 05:59    Microbiology: Recent Results (from the past 240 hour(s))  Resp Panel by RT-PCR (Flu A&B, Covid) Nasopharyngeal Swab     Status: None   Collection Time: 01/08/21  6:41 AM   Specimen: Nasopharyngeal Swab; Nasopharyngeal(NP) swabs in vial transport medium  Result Value Ref Range Status   SARS Coronavirus 2 by RT PCR NEGATIVE NEGATIVE Final    Comment: (NOTE) SARS-CoV-2 target nucleic acids are NOT DETECTED.  The SARS-CoV-2 RNA is generally detectable in upper respiratory specimens during the acute phase of infection. The lowest concentration of SARS-CoV-2 viral copies this assay can detect is 138 copies/mL. A negative result does not preclude SARS-Cov-2 infection and should not be used as the  sole basis for treatment or other patient management decisions. A negative result may occur with  improper specimen collection/handling, submission of specimen other than nasopharyngeal swab, presence of viral mutation(s) within the areas targeted by this assay, and inadequate number of viral copies(<138 copies/mL). A negative result must be combined with clinical observations, patient history, and epidemiological  information. The expected result is Negative.  Fact Sheet for Patients:  EntrepreneurPulse.com.au  Fact Sheet for Healthcare Providers:  IncredibleEmployment.be  This test is no t yet approved or cleared by the [place] FDA and  has been authorized for detection and/or diagnosis of SARS-CoV-2 by FDA under an Emergency Use Authorization (EUA). This EUA will remain  in effect (meaning this test can be used) for the duration of the COVID-19 declaration under Section 564(b)(1) of the Act, 21 U.S.C.section 360bbb-3(b)(1), unless the authorization is terminated  or revoked sooner.       Influenza A by PCR NEGATIVE NEGATIVE Final   Influenza B by PCR NEGATIVE NEGATIVE Final    Comment: (NOTE) The Xpert Xpress SARS-CoV-2/FLU/RSV plus assay is intended as an aid in the diagnosis of influenza from Nasopharyngeal swab specimens and should not be used as a sole basis for treatment. Nasal washings and aspirates are unacceptable for Xpert Xpress SARS-CoV-2/FLU/RSV testing.  Fact Sheet for Patients: EntrepreneurPulse.com.au  Fact Sheet for Healthcare Providers: IncredibleEmployment.be  This test is not yet approved or cleared by the [place] FDA and has been authorized for detection and/or diagnosis of SARS-CoV-2 by FDA under an Emergency Use Authorization (EUA). This EUA will remain in effect (meaning this test can be used) for the duration of the COVID-19 declaration under Section 564(b)(1) of the  Act, 21 U.S.C. section 360bbb-3(b)(1), unless the authorization is terminated or revoked.  Performed at [facility], [place] [address], [place], [place] [address]      Labs: Basic Metabolic Panel: Recent Labs  Lab 01/08/21 0535 01/09/21 0313 01/10/21 0510 01/11/21 0300  NA 136 138 139 140  K 3.8 3.6 3.3* 3.9  CL 102 103 104 107  CO2 22 25 24 26   GLUCOSE 191* 112* 110* 128*  BUN 15 9 6 7   CREATININE 0.62 0.66 0.73 0.62  CALCIUM 9.7 9.1 9.2 9.3  MG  --   --  2.0 2.0  PHOS  --   --  3.6  --    Liver Function Tests: Recent Labs  Lab 01/08/21 0535 01/09/21 0313 01/10/21 0510 01/11/21 0300  AST 104* 142* 122* 73*  ALT 94* 157* 199* 160*  ALKPHOS 74 83 114 101  BILITOT 2.0* 3.1* 1.5* 0.9  PROT 7.2 6.2* 6.3* 6.3*  ALBUMIN 4.1 3.3* 3.4* 3.5   Recent Labs  Lab 01/08/21 0535 01/09/21 0313 01/10/21 0510  LIPASE 25 21 56*   No results for input(s): AMMONIA in the last 168 hours. CBC: Recent Labs  Lab 01/08/21 0535 01/09/21 0313 01/10/21 0510 01/11/21 0300  WBC 5.8 3.9* 6.0 6.7  NEUTROABS 4.7  --   --   --   HGB 10.9* 10.0* 9.7* 10.1*  HCT 33.4* 30.8* 29.8* 31.0*  MCV 100.6* 102.0* 101.0* 101.0*  PLT 330 318 311 321   Cardiac Enzymes: No results for input(s): CKTOTAL, CKMB, CKMBINDEX, TROPONINI in the last 168 hours. BNP: BNP (last 3 results) No results for input(s): BNP in the last 8760 hours.  ProBNP (last 3 results) No results for input(s): PROBNP in the last 8760 hours.  CBG: No results for input(s): GLUCAP in the last 168 hours.     Signed:  Irine Seal MD.  [facility] 01/11/2021, 12:35 PM

## 2021-01-12 ENCOUNTER — Encounter (HOSPITAL_COMMUNITY): Payer: Self-pay | Admitting: Gastroenterology

## 2021-01-12 ENCOUNTER — Other Ambulatory Visit: Payer: Self-pay | Admitting: *Deleted

## 2021-01-12 ENCOUNTER — Encounter: Payer: Self-pay | Admitting: *Deleted

## 2021-01-12 NOTE — Progress Notes (Signed)
Assessment unchanged. Pt verbalized understanding of dc instructions. Discharged via wc to front entrance accompanied by husband and Therapist, sports.

## 2021-01-15 ENCOUNTER — Ambulatory Visit: Payer: 59

## 2021-01-15 ENCOUNTER — Other Ambulatory Visit: Payer: 59

## 2021-01-16 ENCOUNTER — Telehealth: Payer: Self-pay | Admitting: Radiation Oncology

## 2021-01-16 NOTE — Telephone Encounter (Signed)
Called pt to r/s 3/8 appt with Shona Simpson, PA and 3/9 CT SIM to be after 3/38. No answer. LVM.

## 2021-01-19 ENCOUNTER — Encounter: Payer: Self-pay | Admitting: *Deleted

## 2021-01-22 ENCOUNTER — Other Ambulatory Visit: Payer: 59

## 2021-01-22 ENCOUNTER — Ambulatory Visit: Payer: 59

## 2021-01-22 ENCOUNTER — Ambulatory Visit: Payer: 59 | Admitting: Hematology and Oncology

## 2021-01-30 ENCOUNTER — Ambulatory Visit: Payer: 59 | Admitting: Radiation Oncology

## 2021-01-30 ENCOUNTER — Ambulatory Visit: Payer: 59

## 2021-01-31 ENCOUNTER — Ambulatory Visit: Payer: 59 | Admitting: Radiation Oncology

## 2021-02-04 NOTE — Progress Notes (Signed)
Patient Care Team: [name], [place] as PCP - General (Internal Medicine) [name], RN as Oncology Nurse Navigator [name], RN as Oncology Nurse Navigator [name], MD as Consulting Physician (General Surgery) [name], MD as Consulting Physician (Hematology and Oncology) Kyung Rudd, MD as Consulting Physician (Radiation Oncology)  DIAGNOSIS:    ICD-10-CM   1. Malignant neoplasm of lower-outer quadrant of left breast of female, estrogen receptor positive ([place])  C50.512    Z17.0     SUMMARY OF ONCOLOGIC HISTORY: Oncology History  Malignant neoplasm of lower-outer quadrant of left breast of female, estrogen receptor positive ([place])  07/20/2020 Initial Diagnosis   Screening mammogram detected left breast calcifications, enlarged left axillary lymph nodes, and an asymmetry in the right breast. Diagnostic mammogram showed in the left breast, a 2.4cm mass with calcifications, 3:30 position, a left axillary lymph node with cortical thickness, and in the right breast, no suspicious masses or calcifications. Left breast biopsy showed invasive and in situ mammary carcinoma in the breast and axilla, grade 2, HER-2 equivocal by IHC (2+), negative by FISH, ER+ 95%, PR+40%, Ki67 15%.    07/26/2020 Cancer Staging   Staging form: Breast, AJCC 8th Edition - Clinical stage from 07/26/2020: Stage IIA (cT2, cN1(f), cM0, G2, ER+, PR+, HER2-) - Signed by [name], MD on 07/26/2020   08/01/2020 Genetic Testing   No pathogenic variants detected in [facility] Multi-Cancer Panel.  The Multi-Cancer Panel offered by [facility] includes sequencing and/or deletion duplication testing of the following 85 genes: AIP, ALK, APC, ATM, AXIN2,BAP1,  BARD1, BLM, BMPR1A, BRCA1, BRCA2, BRIP1, CASR, CDC73, CDH1, CDK4, CDKN1B, CDKN1C, CDKN2A (p14ARF), CDKN2A (p16INK4a), CEBPA, CHEK2, CTNNA1, DICER1, DIS3L2, EGFR (c.2369C>T, p.Thr790Met variant only), EPCAM (Deletion/duplication testing only), FH, FLCN,  GATA2, GPC3, GREM1 (Promoter region deletion/duplication testing only), HOXB13 (c.251G>A, p.Gly84Glu), HRAS, KIT, MAX, MEN1, MET, MITF (c.952G>A, p.Glu318Lys variant only), MLH1, MSH2, MSH3, MSH6, MUTYH, NBN, NF1, NF2, NTHL1, PALB2, PDGFRA, PHOX2B, PMS2, POLD1, POLE, POT1, PRKAR1A, PTCH1, PTEN, RAD50, RAD51C, RAD51D, RB1, RECQL4, RET, RNF43, RUNX1, SDHAF2, SDHA (sequence changes only), SDHB, SDHC, SDHD, SMAD4, SMARCA4, SMARCB1, SMARCE1, STK11, SUFU, TERC, TERT, TMEM127, TP53, TSC1, TSC2, VHL, WRN and WT1.  The report date is August 01, 2020.    08/16/2020 Surgery   Left lumpectomy [name]): IDC, grade 2, 2.3cm, with intermediate grade DCIS, clear margins, 1/2 left axillary lymph nodes positive for carcinoma.HER-2 equivocal by IHC (2+), negative by FISH, ER+ 95%, PR+40%, Ki67 15%.    08/24/2020 Cancer Staging   Staging form: Breast, AJCC 8th Edition - Pathologic stage from 08/24/2020: Stage IB (pT2, pN1a, cM0, G2, ER+, PR+, HER2-) - Signed by [name], MD on 08/24/2020   08/30/2020 Miscellaneous   MammaPrint high risk   09/13/2020 -  Chemotherapy    Patient is on Treatment Plan: BREAST ADJUVANT DOSE DENSE AC Q14D / PACLITAXEL Q7D        CHIEF COMPLIANT: Cycle 10 Taxol  INTERVAL HISTORY: Lindsay Hampton is a 55 y.o. with above-mentioned history of left breast cancer treated with lumpectomy andwho iscurrentlyon adjuvant chemotherapywith weekly Taxol after completing 4 cycles of Adriamycin and Cytoxan.She presented to the ED on 01/08/21 for vomiting and was admitted until 01/11/21 for cholelithiasis. She underwent a laparoscopic cholecystectomy on 01/09/21. She presents to the clinic todayfor follow-up of her recent hospitalization and treatment.   She has recovered very well from her recent cholecystectomy surgery.  She is here ready to restart her chemo and finish it up.  ALLERGIES:  has No Known Allergies.  MEDICATIONS:  Current Outpatient Medications  Medication Sig Dispense Refill    acetaminophen (TYLENOL) 325 MG tablet You can take 2 tablets every 4 hours as needed for pain.  Do not take over 4000 mg of Tylenol/acetaminophen per day.  Tylenol#3 is Tylenol and codeine.  You need to count each of these tablets as part of your daily maximum of Tylenol.     acetaminophen-codeine (TYLENOL #3) 300-30 MG tablet Take 1 tablet by mouth every 6 (six) hours as needed for severe pain (pain not relieved by plain Tylenol). 15 tablet 0   ALPRAZolam (XANAX) 0.25 MG tablet Take 1 tablet (0.25 mg total) by mouth 2 (two) times daily as needed for anxiety. 60 tablet 3   dexamethasone (DECADRON) 4 MG tablet Take 1 tablet (4 mg total) by mouth as needed (daily as needed for nausea). 30 tablet 0   lidocaine-prilocaine (EMLA) cream Apply to affected area once (Patient taking differently: Apply 1 application topically as needed (port access).) 30 g 3   metoprolol tartrate (LOPRESSOR) 25 MG tablet Take 0.5 tablets (12.5 mg total) by mouth 2 (two) times daily. 60 tablet 1   ondansetron (ZOFRAN-ODT) 8 MG disintegrating tablet TAKE 1 TABLET BY MOUTH EVERY 8 HOURS AS NEEDED FOR NAUSEA OR VOMITING. (Patient taking differently: Take 8 mg by mouth every 8 (eight) hours as needed for nausea or vomiting.) 20 tablet 1   pantoprazole (PROTONIX) 40 MG tablet Take 1 tablet (40 mg total) by mouth daily. 30 tablet 1   prochlorperazine (COMPAZINE) 10 MG tablet Take 1 tablet (10 mg total) by mouth every 6 (six) hours as needed (Nausea or vomiting). 30 tablet 1   sodium chloride (OCEAN) 0.65 % SOLN nasal spray Place 1 spray into both nostrils as needed for congestion.     No current facility-administered medications for this visit.    PHYSICAL EXAMINATION: ECOG PERFORMANCE STATUS: 1 - Symptomatic but completely ambulatory  Vitals:   02/05/21 1125  BP: 110/71  Pulse: 93  Resp: 18  Temp: 97.6 F (36.4 C)  SpO2: 100%   Filed Weights   02/05/21 1125  Weight: 215 lb (97.5 kg)    LABORATORY DATA:   I have reviewed the data as listed CMP Latest Ref Rng & Units 02/05/2021 01/11/2021 01/10/2021  Glucose 70 - 99 mg/dL 128(H) 128(H) 110(H)  BUN 6 - 20 mg/dL 12 7 6   Creatinine 0.44 - 1.00 mg/dL 0.80 0.62 0.73  Sodium 135 - 145 mmol/L 138 140 139  Potassium 3.5 - 5.1 mmol/L 4.1 3.9 3.3(L)  Chloride 98 - 111 mmol/L 108 107 104  CO2 22 - 32 mmol/L 22 26 24   Calcium 8.9 - 10.3 mg/dL 10.1 9.3 9.2  Total Protein 6.5 - 8.1 g/dL 7.3 6.3(L) 6.3(L)  Total Bilirubin 0.3 - 1.2 mg/dL 0.5 0.9 1.5(H)  Alkaline Phos 38 - 126 U/L 67 101 114  AST 15 - 41 U/L 40 73(H) 122(H)  ALT 0 - 44 U/L 50(H) 160(H) 199(H)    Lab Results  Component Value Date   WBC 8.5 02/05/2021   HGB 11.9 (L) 02/05/2021   HCT 35.9 (L) 02/05/2021   MCV 95.2 02/05/2021   PLT 313 02/05/2021   NEUTROABS 4.9 02/05/2021    ASSESSMENT & PLAN:  Malignant neoplasm of lower-outer quadrant of left breast of female, estrogen receptor positive ([place]) 08/16/2020:Left lumpectomy [name]): IDC, grade 2, 2.3cm, with intermediate grade DCIS, clear margins, 1/2 left axillary lymph nodes positive for carcinoma.HER-2 equivocal by  IHC (2+), negative by FISH, ER+ 95%, PR+40%, Ki67 15%. T2 N1 M0 stage Ib MammaPrint: High risk (average 10-year risk untreated: 29%, predicted benefit of chemo with hormone therapy at 5 years: 94.6%  Treatment plan: 1.Adjuvant chemotherapy withdose dense Adriamycin and Cytoxan x4 followed by Taxol   2.Followed by adjuvant radiation 3.Followed byadjuvant antiestrogen therapy ------------------------------------------------------------------------------------------------------------------------------------ Current treatment:Completed 4 cycles ofdose Adriamycin and Cytoxan, today cycle10Taxol Echocardiogram 09/08/2020: EF 60 to 65%  Chemo toxicities: 1.Severe nausea and vomiting:Resolved 2.abdominal distention and bloating:Resolved 3.elevated LFTs: Normalized 4.chemo induced anemia: Monitoring  today's hemoglobin is 11.9  Hospitalization 01/08/2021-01/11/2021: Laparoscopic cholecystectomy on 01/09/2021, ERCP with sphincterotomy 01/10/2021 We will now resume her chemotherapy Return to clinicweekly for Taxol every other week for follow-up with [place]    No orders of the defined types were placed in this encounter.  The patient has a good understanding of the overall plan. she agrees with it. she will call with any problems that may develop before the next visit here.  Total time spent: 30 mins including face to face time and time spent for planning, charting and coordination of care  Rulon Eisenmenger, MD, MPH 02/05/2021  I, Molly Dorshimer, am acting as scribe for Dr. [name].  I have reviewed the above documentation for accuracy and completeness, and I agree with the above.

## 2021-02-05 ENCOUNTER — Inpatient Hospital Stay: Payer: 59

## 2021-02-05 ENCOUNTER — Inpatient Hospital Stay (HOSPITAL_BASED_OUTPATIENT_CLINIC_OR_DEPARTMENT_OTHER): Payer: 59 | Admitting: Hematology and Oncology

## 2021-02-05 ENCOUNTER — Other Ambulatory Visit: Payer: Self-pay

## 2021-02-05 ENCOUNTER — Other Ambulatory Visit: Payer: 59

## 2021-02-05 ENCOUNTER — Ambulatory Visit: Payer: 59 | Admitting: Hematology and Oncology

## 2021-02-05 ENCOUNTER — Inpatient Hospital Stay: Payer: 59 | Attending: Hematology and Oncology

## 2021-02-05 ENCOUNTER — Encounter: Payer: Self-pay | Admitting: *Deleted

## 2021-02-05 ENCOUNTER — Ambulatory Visit: Payer: 59

## 2021-02-05 DIAGNOSIS — Z170 Estrogen receptor positive status [ER+]: Secondary | ICD-10-CM

## 2021-02-05 DIAGNOSIS — C50512 Malignant neoplasm of lower-outer quadrant of left female breast: Secondary | ICD-10-CM | POA: Diagnosis present

## 2021-02-05 DIAGNOSIS — Z5111 Encounter for antineoplastic chemotherapy: Secondary | ICD-10-CM | POA: Insufficient documentation

## 2021-02-05 DIAGNOSIS — Z95828 Presence of other vascular implants and grafts: Secondary | ICD-10-CM

## 2021-02-05 DIAGNOSIS — Z7952 Long term (current) use of systemic steroids: Secondary | ICD-10-CM | POA: Diagnosis not present

## 2021-02-05 DIAGNOSIS — Z79899 Other long term (current) drug therapy: Secondary | ICD-10-CM | POA: Insufficient documentation

## 2021-02-05 LAB — CBC WITH DIFFERENTIAL (CANCER CENTER ONLY)
Abs Immature Granulocytes: 0.01 10*3/uL (ref 0.00–0.07)
Basophils Absolute: 0.1 10*3/uL (ref 0.0–0.1)
Basophils Relative: 1 %
Eosinophils Absolute: 1.3 10*3/uL — ABNORMAL HIGH (ref 0.0–0.5)
Eosinophils Relative: 15 %
HCT: 35.9 % — ABNORMAL LOW (ref 36.0–46.0)
Hemoglobin: 11.9 g/dL — ABNORMAL LOW (ref 12.0–15.0)
Immature Granulocytes: 0 %
Lymphocytes Relative: 17 %
Lymphs Abs: 1.5 10*3/uL (ref 0.7–4.0)
MCH: 31.6 pg (ref 26.0–34.0)
MCHC: 33.1 g/dL (ref 30.0–36.0)
MCV: 95.2 fL (ref 80.0–100.0)
Monocytes Absolute: 0.7 10*3/uL (ref 0.1–1.0)
Monocytes Relative: 8 %
Neutro Abs: 4.9 10*3/uL (ref 1.7–7.7)
Neutrophils Relative %: 59 %
Platelet Count: 313 10*3/uL (ref 150–400)
RBC: 3.77 MIL/uL — ABNORMAL LOW (ref 3.87–5.11)
RDW: 13.2 % (ref 11.5–15.5)
WBC Count: 8.5 10*3/uL (ref 4.0–10.5)
nRBC: 0 % (ref 0.0–0.2)

## 2021-02-05 LAB — CMP (CANCER CENTER ONLY)
ALT: 50 U/L — ABNORMAL HIGH (ref 0–44)
AST: 40 U/L (ref 15–41)
Albumin: 3.7 g/dL (ref 3.5–5.0)
Alkaline Phosphatase: 67 U/L (ref 38–126)
Anion gap: 8 (ref 5–15)
BUN: 12 mg/dL (ref 6–20)
CO2: 22 mmol/L (ref 22–32)
Calcium: 10.1 mg/dL (ref 8.9–10.3)
Chloride: 108 mmol/L (ref 98–111)
Creatinine: 0.8 mg/dL (ref 0.44–1.00)
GFR, Estimated: >60 mL/min (ref >60)
Glucose, Bld: 128 mg/dL — ABNORMAL HIGH (ref 70–99)
Potassium: 4.1 mmol/L (ref 3.5–5.1)
Sodium: 138 mmol/L (ref 135–145)
Total Bilirubin: 0.5 mg/dL (ref 0.3–1.2)
Total Protein: 7.3 g/dL (ref 6.5–8.1)

## 2021-02-05 MED ORDER — FAMOTIDINE IN NACL 20-0.9 MG/50ML-% IV SOLN
INTRAVENOUS | Status: AC
  Filled 2021-02-05: qty 50

## 2021-02-05 MED ORDER — SODIUM CHLORIDE 0.9% FLUSH
10.0000 mL | Freq: Once | INTRAVENOUS | Status: AC
  Administered 2021-02-05: 10 mL
  Filled 2021-02-05: qty 10

## 2021-02-05 MED ORDER — SODIUM CHLORIDE 0.9 % IV SOLN
10.0000 mg | Freq: Once | INTRAVENOUS | Status: DC
  Filled 2021-02-05: qty 1

## 2021-02-05 MED ORDER — FAMOTIDINE IN NACL 20-0.9 MG/50ML-% IV SOLN
20.0000 mg | Freq: Once | INTRAVENOUS | Status: AC
  Administered 2021-02-05: 20 mg via INTRAVENOUS

## 2021-02-05 MED ORDER — SODIUM CHLORIDE 0.9 % IV SOLN
Freq: Once | INTRAVENOUS | Status: AC
  Filled 2021-02-05: qty 250

## 2021-02-05 MED ORDER — DIPHENHYDRAMINE HCL 50 MG/ML IJ SOLN
INTRAMUSCULAR | Status: AC
  Filled 2021-02-05: qty 1

## 2021-02-05 MED ORDER — ONDANSETRON HCL 4 MG/2ML IJ SOLN
8.0000 mg | Freq: Once | INTRAMUSCULAR | Status: AC
  Administered 2021-02-05: 8 mg via INTRAVENOUS

## 2021-02-05 MED ORDER — SODIUM CHLORIDE 0.9% FLUSH
10.0000 mL | INTRAVENOUS | Status: DC | PRN
  Administered 2021-02-05: 10 mL
  Filled 2021-02-05: qty 10

## 2021-02-05 MED ORDER — ONDANSETRON HCL 4 MG/2ML IJ SOLN
INTRAMUSCULAR | Status: AC
  Filled 2021-02-05: qty 4

## 2021-02-05 MED ORDER — DEXAMETHASONE SODIUM PHOSPHATE 100 MG/10ML IJ SOLN
10.0000 mg | Freq: Once | INTRAMUSCULAR | Status: AC
  Administered 2021-02-05: 10 mg via INTRAVENOUS
  Filled 2021-02-05: qty 10

## 2021-02-05 MED ORDER — HEPARIN SOD (PORK) LOCK FLUSH 100 UNIT/ML IV SOLN
500.0000 [IU] | Freq: Once | INTRAVENOUS | Status: AC | PRN
  Administered 2021-02-05: 500 [IU]
  Filled 2021-02-05: qty 5

## 2021-02-05 MED ORDER — SODIUM CHLORIDE 0.9 % IV SOLN
65.0000 mg/m2 | Freq: Once | INTRAVENOUS | Status: AC
  Administered 2021-02-05: 144 mg via INTRAVENOUS
  Filled 2021-02-05: qty 24

## 2021-02-05 MED ORDER — DIPHENHYDRAMINE HCL 50 MG/ML IJ SOLN
25.0000 mg | Freq: Once | INTRAMUSCULAR | Status: AC
  Administered 2021-02-05: 25 mg via INTRAVENOUS

## 2021-02-05 NOTE — Patient Instructions (Signed)
[facility] Discharge Instructions for Patients Receiving Chemotherapy  Today you received the following chemotherapy agents: paclitaxel.  To help prevent nausea and vomiting after your treatment, we encourage you to take your nausea medication as directed.   If you develop nausea and vomiting that is not controlled by your nausea medication, call the clinic.   BELOW ARE SYMPTOMS THAT SHOULD BE REPORTED IMMEDIATELY:  *FEVER GREATER THAN 100.5 F  *CHILLS WITH OR WITHOUT FEVER  NAUSEA AND VOMITING THAT IS NOT CONTROLLED WITH YOUR NAUSEA MEDICATION  *UNUSUAL SHORTNESS OF BREATH  *UNUSUAL BRUISING OR BLEEDING  TENDERNESS IN MOUTH AND THROAT WITH OR WITHOUT PRESENCE OF ULCERS  *URINARY PROBLEMS  *BOWEL PROBLEMS  UNUSUAL RASH Items with * indicate a potential emergency and should be followed up as soon as possible.  Feel free to call the clinic should you have any questions or concerns. The clinic phone number is [phone].  Please show the CHEMO ALERT CARD at check-in to the Emergency Department and triage nurse.   

## 2021-02-05 NOTE — Patient Instructions (Signed)
Implanted Port Insertion, Care After This sheet gives you information about how to care for yourself after your procedure. Your health care provider may also give you more specific instructions. If you have problems or questions, contact your health care provider. What can I expect after the procedure? After the procedure, it is common to have:  Discomfort at the port insertion site.  Bruising on the skin over the port. This should improve over 3-4 days. Follow these instructions at home: Port care  After your port is placed, you will get a manufacturer's information card. The card has information about your port. Keep this card with you at all times.  Take care of the port as told by your health care provider. Ask your health care provider if you or a family member can get training for taking care of the port at home. A home health care nurse may also take care of the port.  Make sure to remember what type of port you have. Incision care  Follow instructions from your health care provider about how to take care of your port insertion site. Make sure you: ? Wash your hands with soap and water before and after you change your bandage (dressing). If soap and water are not available, use hand sanitizer. ? Change your dressing as told by your health care provider. ? Leave stitches (sutures), skin glue, or adhesive strips in place. These skin closures may need to stay in place for 2 weeks or longer. If adhesive strip edges start to loosen and curl up, you may trim the loose edges. Do not remove adhesive strips completely unless your health care provider tells you to do that.  Check your port insertion site every day for signs of infection. Check for: ? Redness, swelling, or pain. ? Fluid or blood. ? Warmth. ? Pus or a bad smell.      Activity  Return to your normal activities as told by your health care provider. Ask your health care provider what activities are safe for you.  Do not  lift anything that is heavier than 10 lb (4.5 kg), or the limit that you are told, until your health care provider says that it is safe. General instructions  Take over-the-counter and prescription medicines only as told by your health care provider.  Do not take baths, swim, or use a hot tub until your health care provider approves. Ask your health care provider if you may take showers. You may only be allowed to take sponge baths.  Do not drive for 24 hours if you were given a sedative during your procedure.  Wear a medical alert bracelet in case of an emergency. This will tell any health care providers that you have a port.  Keep all follow-up visits as told by your health care provider. This is important. Contact a health care provider if:  You cannot flush your port with saline as directed, or you cannot draw blood from the port.  You have a fever or chills.  You have redness, swelling, or pain around your port insertion site.  You have fluid or blood coming from your port insertion site.  Your port insertion site feels warm to the touch.  You have pus or a bad smell coming from the port insertion site. Get help right away if:  You have chest pain or shortness of breath.  You have bleeding from your port that you cannot control. Summary  Take care of the port as told by your   health care provider. Keep the manufacturer's information card with you at all times.  Change your dressing as told by your health care provider.  Contact a health care provider if you have a fever or chills or if you have redness, swelling, or pain around your port insertion site.  Keep all follow-up visits as told by your health care provider. This information is not intended to replace advice given to you by your health care provider. Make sure you discuss any questions you have with your health care provider. Document Revised: 06/09/2018 Document Reviewed: 06/09/2018 Elsevier Patient Education   2021 [employer]vier Inc.  

## 2021-02-05 NOTE — Assessment & Plan Note (Signed)
08/16/2020:Left lumpectomy Lindsay Hampton): IDC, grade 2, 2.3cm, with intermediate grade DCIS, clear margins, 1/2 left axillary lymph nodes positive for carcinoma.HER-2 equivocal by IHC (2+), negative by FISH, ER+ 95%, PR+40%, Ki67 15%. T2 N1 M0 stage Ib MammaPrint: High risk (average 10-year risk untreated: 29%, predicted benefit of chemo with hormone therapy at 5 years: 94.6%  Treatment plan: 1.Adjuvant chemotherapy withdose dense Adriamycin and Cytoxan x4 followed by Taxol   2.Followed by adjuvant radiation 3.Followed byadjuvant antiestrogen therapy ------------------------------------------------------------------------------------------------------------------------------------ Current treatment:Completed 4 cycles ofdose Adriamycin and Cytoxan, today cycle10Taxol Echocardiogram 09/08/2020: EF 60 to 65%  Chemo toxicities: 1.Severe nausea and vomiting:Resolved 2.abdominal distention and bloating:Resolved 3.elevated LFTs: Normalized 4.chemo induced anemia: Monitoring today's hemoglobin is 9.9  Hospitalization 01/08/2021-01/11/2021: Laparoscopic cholecystectomy on 01/09/2021, ERCP with sphincterotomy 01/10/2021 We will now resume her chemotherapy Return to clinicweekly for Taxol every other week for follow-up with [place]

## 2021-02-06 ENCOUNTER — Other Ambulatory Visit: Payer: Self-pay | Admitting: Hematology and Oncology

## 2021-02-06 DIAGNOSIS — F4322 Adjustment disorder with anxiety: Secondary | ICD-10-CM

## 2021-02-06 NOTE — Telephone Encounter (Signed)
Last Rx prescribed 10/11 with 3 refills

## 2021-02-12 ENCOUNTER — Inpatient Hospital Stay: Payer: 59

## 2021-02-12 ENCOUNTER — Other Ambulatory Visit: Payer: Self-pay

## 2021-02-12 VITALS — BP 116/85 | HR 93 | Temp 98.6°F | Resp 18 | Wt 214.2 lb

## 2021-02-12 DIAGNOSIS — C50512 Malignant neoplasm of lower-outer quadrant of left female breast: Secondary | ICD-10-CM

## 2021-02-12 DIAGNOSIS — Z95828 Presence of other vascular implants and grafts: Secondary | ICD-10-CM

## 2021-02-12 DIAGNOSIS — Z5111 Encounter for antineoplastic chemotherapy: Secondary | ICD-10-CM | POA: Diagnosis not present

## 2021-02-12 DIAGNOSIS — Z170 Estrogen receptor positive status [ER+]: Secondary | ICD-10-CM

## 2021-02-12 LAB — CBC WITH DIFFERENTIAL (CANCER CENTER ONLY)
Abs Immature Granulocytes: 0.03 10*3/uL (ref 0.00–0.07)
Basophils Absolute: 0 10*3/uL (ref 0.0–0.1)
Basophils Relative: 0 %
Eosinophils Absolute: 0.7 10*3/uL — ABNORMAL HIGH (ref 0.0–0.5)
Eosinophils Relative: 10 %
HCT: 35.4 % — ABNORMAL LOW (ref 36.0–46.0)
Hemoglobin: 11.8 g/dL — ABNORMAL LOW (ref 12.0–15.0)
Immature Granulocytes: 0 %
Lymphocytes Relative: 16 %
Lymphs Abs: 1.1 10*3/uL (ref 0.7–4.0)
MCH: 30.6 pg (ref 26.0–34.0)
MCHC: 33.3 g/dL (ref 30.0–36.0)
MCV: 91.9 fL (ref 80.0–100.0)
Monocytes Absolute: 0.4 10*3/uL (ref 0.1–1.0)
Monocytes Relative: 6 %
Neutro Abs: 4.6 10*3/uL (ref 1.7–7.7)
Neutrophils Relative %: 68 %
Platelet Count: 331 10*3/uL (ref 150–400)
RBC: 3.85 MIL/uL — ABNORMAL LOW (ref 3.87–5.11)
RDW: 13.1 % (ref 11.5–15.5)
WBC Count: 6.8 10*3/uL (ref 4.0–10.5)
nRBC: 0 % (ref 0.0–0.2)

## 2021-02-12 LAB — CMP (CANCER CENTER ONLY)
ALT: 44 U/L (ref 0–44)
AST: 34 U/L (ref 15–41)
Albumin: 3.8 g/dL (ref 3.5–5.0)
Alkaline Phosphatase: 66 U/L (ref 38–126)
Anion gap: 7 (ref 5–15)
BUN: 12 mg/dL (ref 6–20)
CO2: 24 mmol/L (ref 22–32)
Calcium: 10.4 mg/dL — ABNORMAL HIGH (ref 8.9–10.3)
Chloride: 104 mmol/L (ref 98–111)
Creatinine: 0.78 mg/dL (ref 0.44–1.00)
GFR, Estimated: >60 mL/min (ref >60)
Glucose, Bld: 113 mg/dL — ABNORMAL HIGH (ref 70–99)
Potassium: 4 mmol/L (ref 3.5–5.1)
Sodium: 135 mmol/L (ref 135–145)
Total Bilirubin: 0.6 mg/dL (ref 0.3–1.2)
Total Protein: 7.3 g/dL (ref 6.5–8.1)

## 2021-02-12 MED ORDER — SODIUM CHLORIDE 0.9 % IV SOLN
Freq: Once | INTRAVENOUS | Status: AC
  Filled 2021-02-12: qty 250

## 2021-02-12 MED ORDER — ONDANSETRON HCL 4 MG/2ML IJ SOLN
INTRAMUSCULAR | Status: AC
  Filled 2021-02-12: qty 4

## 2021-02-12 MED ORDER — SODIUM CHLORIDE 0.9 % IV SOLN
65.0000 mg/m2 | Freq: Once | INTRAVENOUS | Status: AC
  Administered 2021-02-12: 144 mg via INTRAVENOUS
  Filled 2021-02-12: qty 24

## 2021-02-12 MED ORDER — FAMOTIDINE IN NACL 20-0.9 MG/50ML-% IV SOLN
INTRAVENOUS | Status: AC
  Filled 2021-02-12: qty 50

## 2021-02-12 MED ORDER — SODIUM CHLORIDE 0.9% FLUSH
10.0000 mL | Freq: Once | INTRAVENOUS | Status: AC
  Administered 2021-02-12: 10 mL
  Filled 2021-02-12: qty 10

## 2021-02-12 MED ORDER — HEPARIN SOD (PORK) LOCK FLUSH 100 UNIT/ML IV SOLN
500.0000 [IU] | Freq: Once | INTRAVENOUS | Status: AC | PRN
  Administered 2021-02-12: 500 [IU]
  Filled 2021-02-12: qty 5

## 2021-02-12 MED ORDER — ONDANSETRON HCL 4 MG/2ML IJ SOLN
8.0000 mg | Freq: Once | INTRAMUSCULAR | Status: AC
  Administered 2021-02-12: 8 mg via INTRAVENOUS

## 2021-02-12 MED ORDER — SODIUM CHLORIDE 0.9% FLUSH
10.0000 mL | INTRAVENOUS | Status: DC | PRN
  Administered 2021-02-12: 10 mL
  Filled 2021-02-12: qty 10

## 2021-02-12 MED ORDER — DIPHENHYDRAMINE HCL 50 MG/ML IJ SOLN
INTRAMUSCULAR | Status: AC
  Filled 2021-02-12: qty 1

## 2021-02-12 MED ORDER — DIPHENHYDRAMINE HCL 50 MG/ML IJ SOLN
25.0000 mg | Freq: Once | INTRAMUSCULAR | Status: AC
  Administered 2021-02-12: 25 mg via INTRAVENOUS

## 2021-02-12 MED ORDER — SODIUM CHLORIDE 0.9 % IV SOLN
10.0000 mg | Freq: Once | INTRAVENOUS | Status: AC
  Administered 2021-02-12: 10 mg via INTRAVENOUS
  Filled 2021-02-12: qty 10

## 2021-02-12 MED ORDER — FAMOTIDINE IN NACL 20-0.9 MG/50ML-% IV SOLN
20.0000 mg | Freq: Once | INTRAVENOUS | Status: AC
  Administered 2021-02-12: 20 mg via INTRAVENOUS

## 2021-02-12 NOTE — Patient Instructions (Signed)
[facility] Discharge Instructions for Patients Receiving Chemotherapy  Today you received the following chemotherapy agents: paclitaxel.  To help prevent nausea and vomiting after your treatment, we encourage you to take your nausea medication as directed.   If you develop nausea and vomiting that is not controlled by your nausea medication, call the clinic.   BELOW ARE SYMPTOMS THAT SHOULD BE REPORTED IMMEDIATELY:  *FEVER GREATER THAN 100.5 F  *CHILLS WITH OR WITHOUT FEVER  NAUSEA AND VOMITING THAT IS NOT CONTROLLED WITH YOUR NAUSEA MEDICATION  *UNUSUAL SHORTNESS OF BREATH  *UNUSUAL BRUISING OR BLEEDING  TENDERNESS IN MOUTH AND THROAT WITH OR WITHOUT PRESENCE OF ULCERS  *URINARY PROBLEMS  *BOWEL PROBLEMS  UNUSUAL RASH Items with * indicate a potential emergency and should be followed up as soon as possible.  Feel free to call the clinic should you have any questions or concerns. The clinic phone number is [phone].  Please show the CHEMO ALERT CARD at check-in to the Emergency Department and triage nurse.   

## 2021-02-12 NOTE — Patient Instructions (Signed)
Implanted Port Insertion, Care After This sheet gives you information about how to care for yourself after your procedure. Your health care provider may also give you more specific instructions. If you have problems or questions, contact your health care provider. What can I expect after the procedure? After the procedure, it is common to have:  Discomfort at the port insertion site.  Bruising on the skin over the port. This should improve over 3-4 days. Follow these instructions at home: Port care  After your port is placed, you will get a manufacturer's information card. The card has information about your port. Keep this card with you at all times.  Take care of the port as told by your health care provider. Ask your health care provider if you or a family member can get training for taking care of the port at home. A home health care nurse may also take care of the port.  Make sure to remember what type of port you have. Incision care  Follow instructions from your health care provider about how to take care of your port insertion site. Make sure you: ? Wash your hands with soap and water before and after you change your bandage (dressing). If soap and water are not available, use hand sanitizer. ? Change your dressing as told by your health care provider. ? Leave stitches (sutures), skin glue, or adhesive strips in place. These skin closures may need to stay in place for 2 weeks or longer. If adhesive strip edges start to loosen and curl up, you may trim the loose edges. Do not remove adhesive strips completely unless your health care provider tells you to do that.  Check your port insertion site every day for signs of infection. Check for: ? Redness, swelling, or pain. ? Fluid or blood. ? Warmth. ? Pus or a bad smell.      Activity  Return to your normal activities as told by your health care provider. Ask your health care provider what activities are safe for you.  Do not  lift anything that is heavier than 10 lb (4.5 kg), or the limit that you are told, until your health care provider says that it is safe. General instructions  Take over-the-counter and prescription medicines only as told by your health care provider.  Do not take baths, swim, or use a hot tub until your health care provider approves. Ask your health care provider if you may take showers. You may only be allowed to take sponge baths.  Do not drive for 24 hours if you were given a sedative during your procedure.  Wear a medical alert bracelet in case of an emergency. This will tell any health care providers that you have a port.  Keep all follow-up visits as told by your health care provider. This is important. Contact a health care provider if:  You cannot flush your port with saline as directed, or you cannot draw blood from the port.  You have a fever or chills.  You have redness, swelling, or pain around your port insertion site.  You have fluid or blood coming from your port insertion site.  Your port insertion site feels warm to the touch.  You have pus or a bad smell coming from the port insertion site. Get help right away if:  You have chest pain or shortness of breath.  You have bleeding from your port that you cannot control. Summary  Take care of the port as told by your   health care provider. Keep the manufacturer's information card with you at all times.  Change your dressing as told by your health care provider.  Contact a health care provider if you have a fever or chills or if you have redness, swelling, or pain around your port insertion site.  Keep all follow-up visits as told by your health care provider. This information is not intended to replace advice given to you by your health care provider. Make sure you discuss any questions you have with your health care provider. Document Revised: 06/09/2018 Document Reviewed: 06/09/2018 Elsevier Patient Education   2021 [employer]vier Inc.  

## 2021-02-14 ENCOUNTER — Telehealth: Payer: Self-pay

## 2021-02-14 NOTE — Telephone Encounter (Signed)
Left patient a voicemail message in regards to telephone visit with Shona Simpson PA on 02/17/21 @ 10:30am. Called to review meaningful use questions. TM

## 2021-02-18 NOTE — Assessment & Plan Note (Signed)
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 T2 N1 M0 stage Ib MammaPrint: High risk (average 10-year risk untreated: 29%, predicted benefit of chemo with hormone therapy at 5 years: 94.6%  Treatment plan: 1.Adjuvant chemotherapy withdose dense Adriamycin and Cytoxan x4 followed by Taxol   2.Followed by adjuvant radiation 3.Followed byadjuvant antiestrogen therapy ------------------------------------------------------------------------------------------------------------------------------------ Current treatment:Completed 4 cycles ofdose Adriamycin and Cytoxan, today cycle12Taxol Echocardiogram 09/08/2020: EF 60 to 65%  Chemo toxicities: 1.Severe nausea and vomiting:Resolved 2.abdominal distention and bloating:Resolved 3.elevated LFTs: Normalized 4.chemo induced anemia: Monitoring today's hemoglobin is 11.9  Hospitalization 01/08/2021-01/11/2021: Laparoscopic cholecystectomy on 01/09/2021, ERCP with sphincterotomy 01/10/2021  This concludes her chemo Radiation will be next  RTC at end of XRT

## 2021-02-18 NOTE — Progress Notes (Signed)
Patient Care Team: [name], Ashley as PCP - General (Internal Medicine) [name], RN as Oncology Nurse Navigator [name], RN as Oncology Nurse Navigator [name], MD as Consulting Physician (General Surgery) [name], MD as Consulting Physician (Hematology and Oncology) Kyung Rudd, MD as Consulting Physician (Radiation Oncology)  DIAGNOSIS:    ICD-10-CM   1. Malignant neoplasm of lower-outer quadrant of left breast of female, estrogen receptor positive ([place])  C50.512    Z17.0     SUMMARY OF ONCOLOGIC HISTORY: Oncology History  Malignant neoplasm of lower-outer quadrant of left breast of female, estrogen receptor positive ([place])  07/20/2020 Initial Diagnosis   Screening mammogram detected left breast calcifications, enlarged left axillary lymph nodes, and an asymmetry in the right breast. Diagnostic mammogram showed in the left breast, a 2.4cm mass with calcifications, 3:30 position, a left axillary lymph node with cortical thickness, and in the right breast, no suspicious masses or calcifications. Left breast biopsy showed invasive and in situ mammary carcinoma in the breast and axilla, grade 2, HER-2 equivocal by IHC (2+), negative by FISH, ER+ 95%, PR+40%, Ki67 15%.    07/26/2020 Cancer Staging   Staging form: Breast, AJCC 8th Edition - Clinical stage from 07/26/2020: Stage IIA (cT2, cN1(f), cM0, G2, ER+, PR+, HER2-) - Signed by [name], MD on 07/26/2020   08/01/2020 Genetic Testing   No pathogenic variants detected in [facility] Multi-Cancer Panel.  The Multi-Cancer Panel offered by [facility] includes sequencing and/or deletion duplication testing of the following 85 genes: AIP, ALK, APC, ATM, AXIN2,BAP1,  BARD1, BLM, BMPR1A, BRCA1, BRCA2, BRIP1, CASR, CDC73, CDH1, CDK4, CDKN1B, CDKN1C, CDKN2A (p14ARF), CDKN2A (p16INK4a), CEBPA, CHEK2, CTNNA1, DICER1, DIS3L2, EGFR (c.2369C>T, p.Thr790Met variant only), EPCAM (Deletion/duplication testing only), FH, FLCN,  GATA2, GPC3, GREM1 (Promoter region deletion/duplication testing only), HOXB13 (c.251G>A, p.Gly84Glu), HRAS, KIT, MAX, MEN1, MET, MITF (c.952G>A, p.Glu318Lys variant only), MLH1, MSH2, MSH3, MSH6, MUTYH, NBN, NF1, NF2, NTHL1, PALB2, PDGFRA, PHOX2B, PMS2, POLD1, POLE, POT1, PRKAR1A, PTCH1, PTEN, RAD50, RAD51C, RAD51D, RB1, RECQL4, RET, RNF43, RUNX1, SDHAF2, SDHA (sequence changes only), SDHB, SDHC, SDHD, SMAD4, SMARCA4, SMARCB1, SMARCE1, STK11, SUFU, TERC, TERT, TMEM127, TP53, TSC1, TSC2, VHL, WRN and WT1.  The report date is August 01, 2020.    08/16/2020 Surgery   Left lumpectomy [name]): IDC, grade 2, 2.3cm, with intermediate grade DCIS, clear margins, 1/2 left axillary lymph nodes positive for carcinoma.HER-2 equivocal by IHC (2+), negative by FISH, ER+ 95%, PR+40%, Ki67 15%.    08/24/2020 Cancer Staging   Staging form: Breast, AJCC 8th Edition - Pathologic stage from 08/24/2020: Stage IB (pT2, pN1a, cM0, G2, ER+, PR+, HER2-) - Signed by [name], MD on 08/24/2020   08/30/2020 Miscellaneous   MammaPrint high risk   09/13/2020 -  Chemotherapy    Patient is on Treatment Plan: BREAST ADJUVANT DOSE DENSE AC Q14D / PACLITAXEL Q7D        CHIEF COMPLIANT: Cycle 12 Taxol  INTERVAL HISTORY: Lindsay Hampton is a 55 y.o. with above-mentioned history of left breast cancer treated with lumpectomy andwho iscurrentlyon adjuvant chemotherapywith weekly Taxol after completing 4 cycles of Adriamycin and Cytoxan. She presents to the clinic todayfor cycle 12.   She tolerated the last 2 cycles of chemo extremely well.  She did not have any nausea or vomiting.  Her toenail fell off but she is without any pain or discomfort.  Denies neuropathy.  She has appointments with radiation oncology.  She had a pustular skin lesion on the medial aspect  of the right thigh.  ALLERGIES:  has No Known Allergies.  MEDICATIONS:  Current Outpatient Medications  Medication Sig Dispense Refill  . acetaminophen  (TYLENOL) 325 MG tablet You can take 2 tablets every 4 hours as needed for pain.  Do not take over 4000 mg of Tylenol/acetaminophen per day.  Tylenol#3 is Tylenol and codeine.  You need to count each of these tablets as part of your daily maximum of Tylenol.    Marland Kitchen acetaminophen-codeine (TYLENOL #3) 300-30 MG tablet Take 1 tablet by mouth every 6 (six) hours as needed for severe pain (pain not relieved by plain Tylenol). 15 tablet 0  . ALPRAZolam (XANAX) 0.25 MG tablet TAKE 1 TABLET BY MOUTH 2 TIMES DAILY AS NEEDED FOR ANXIETY. 60 tablet 3  . dexamethasone (DECADRON) 4 MG tablet Take 1 tablet (4 mg total) by mouth as needed (daily as needed for nausea). 30 tablet 0  . lidocaine-prilocaine (EMLA) cream Apply to affected area once (Patient taking differently: Apply 1 application topically as needed (port access).) 30 g 3  . metoprolol tartrate (LOPRESSOR) 25 MG tablet Take 0.5 tablets (12.5 mg total) by mouth 2 (two) times daily. 60 tablet 1  . ondansetron (ZOFRAN-ODT) 8 MG disintegrating tablet TAKE 1 TABLET BY MOUTH EVERY 8 HOURS AS NEEDED FOR NAUSEA OR VOMITING. (Patient taking differently: Take 8 mg by mouth every 8 (eight) hours as needed for nausea or vomiting.) 20 tablet 1  . pantoprazole (PROTONIX) 40 MG tablet Take 1 tablet (40 mg total) by mouth daily. 30 tablet 1  . prochlorperazine (COMPAZINE) 10 MG tablet Take 1 tablet (10 mg total) by mouth every 6 (six) hours as needed (Nausea or vomiting). 30 tablet 1  . sodium chloride (OCEAN) 0.65 % SOLN nasal spray Place 1 spray into both nostrils as needed for congestion.     No current facility-administered medications for this visit.    PHYSICAL EXAMINATION: ECOG PERFORMANCE STATUS: 1 - Symptomatic but completely ambulatory  There were no vitals filed for this visit. There were no vitals filed for this visit.   LABORATORY DATA:  I have reviewed the data as listed CMP Latest Ref Rng & Units 02/12/2021 02/05/2021 01/11/2021  Glucose 70 - 99 mg/dL  113(H) 128(H) 128(H)  BUN 6 - 20 mg/dL 12 12 7   Creatinine 0.44 - 1.00 mg/dL 0.78 0.80 0.62  Sodium 135 - 145 mmol/L 135 138 140  Potassium 3.5 - 5.1 mmol/L 4.0 4.1 3.9  Chloride 98 - 111 mmol/L 104 108 107  CO2 22 - 32 mmol/L 24 22 26   Calcium 8.9 - 10.3 mg/dL 10.4(H) 10.1 9.3  Total Protein 6.5 - 8.1 g/dL 7.3 7.3 6.3(L)  Total Bilirubin 0.3 - 1.2 mg/dL 0.6 0.5 0.9  Alkaline Phos 38 - 126 U/L 66 67 101  AST 15 - 41 U/L 34 40 73(H)  ALT 0 - 44 U/L 44 50(H) 160(H)    Lab Results  Component Value Date   WBC 4.2 02/19/2021   HGB 11.1 (L) 02/19/2021   HCT 33.6 (L) 02/19/2021   MCV 93.6 02/19/2021   PLT 301 02/19/2021   NEUTROABS 2.3 02/19/2021    ASSESSMENT & PLAN:  Malignant neoplasm of lower-outer quadrant of left breast of female, estrogen receptor positive ([place]) 08/16/2020:Left lumpectomy [name]): IDC, grade 2, 2.3cm, with intermediate grade DCIS, clear margins, 1/2 left axillary lymph nodes positive for carcinoma.HER-2 equivocal by IHC (2+), negative by FISH, ER+ 95%, PR+40%, Ki67 15%. T2 N1 M0 stage Ib MammaPrint: High risk (average  10-year risk untreated: 29%, predicted benefit of chemo with hormone therapy at 5 years: 94.6%  Treatment plan: 1.Adjuvant chemotherapy withdose dense Adriamycin and Cytoxan x4 followed by Taxol   2.Followed by adjuvant radiation 3.Followed byadjuvant antiestrogen therapy ------------------------------------------------------------------------------------------------------------------------------------ Current treatment:Completed 4 cycles ofdose Adriamycin and Cytoxan, today cycle12Taxol Echocardiogram 09/08/2020: EF 60 to 65%  Chemo toxicities: 1.Severe nausea and vomiting:Resolved 2.abdominal distention and bloating:Resolved 3.elevated LFTs: Normalized 4.chemo induced anemia: Monitoring today's hemoglobin is 11.9  Hospitalization 01/08/2021-01/11/2021: Laparoscopic cholecystectomy on 01/09/2021, ERCP with sphincterotomy  01/10/2021  This concludes her chemo Radiation will be next I sent a message to Dr. [name] to get her port removed.  Right thigh medial aspect pustular lesion: I applied pressure and it drained.  I sent an antibiotic prescription of Keflex.  RTC at end of XRT    No orders of the defined types were placed in this encounter.  The patient has a good understanding of the overall plan. she agrees with it. she will call with any problems that may develop before the next visit here.  Total time spent: 30 mins including face to face time and time spent for planning, charting and coordination of care  Rulon Eisenmenger, MD, MPH 02/19/2021  I, Cloyde Reams Dorshimer, am acting as scribe for Dr. [name].  I have reviewed the above documentation for accuracy and completeness, and I agree with the above.

## 2021-02-19 ENCOUNTER — Ambulatory Visit: Payer: 59 | Attending: Surgery

## 2021-02-19 ENCOUNTER — Telehealth: Payer: Self-pay

## 2021-02-19 ENCOUNTER — Inpatient Hospital Stay: Payer: 59

## 2021-02-19 ENCOUNTER — Encounter: Payer: Self-pay | Admitting: *Deleted

## 2021-02-19 ENCOUNTER — Ambulatory Visit: Payer: Self-pay | Admitting: Surgery

## 2021-02-19 ENCOUNTER — Inpatient Hospital Stay (HOSPITAL_BASED_OUTPATIENT_CLINIC_OR_DEPARTMENT_OTHER): Payer: 59 | Admitting: Hematology and Oncology

## 2021-02-19 ENCOUNTER — Encounter: Payer: Self-pay | Admitting: Radiation Oncology

## 2021-02-19 ENCOUNTER — Other Ambulatory Visit: Payer: Self-pay

## 2021-02-19 DIAGNOSIS — C50512 Malignant neoplasm of lower-outer quadrant of left female breast: Secondary | ICD-10-CM

## 2021-02-19 DIAGNOSIS — Z483 Aftercare following surgery for neoplasm: Secondary | ICD-10-CM | POA: Insufficient documentation

## 2021-02-19 DIAGNOSIS — Z170 Estrogen receptor positive status [ER+]: Secondary | ICD-10-CM

## 2021-02-19 DIAGNOSIS — Z5111 Encounter for antineoplastic chemotherapy: Secondary | ICD-10-CM | POA: Diagnosis not present

## 2021-02-19 DIAGNOSIS — Z95828 Presence of other vascular implants and grafts: Secondary | ICD-10-CM

## 2021-02-19 LAB — CMP (CANCER CENTER ONLY)
ALT: 35 U/L (ref 0–44)
AST: 25 U/L (ref 15–41)
Albumin: 3.6 g/dL (ref 3.5–5.0)
Alkaline Phosphatase: 62 U/L (ref 38–126)
Anion gap: 10 (ref 5–15)
BUN: 9 mg/dL (ref 6–20)
CO2: 23 mmol/L (ref 22–32)
Calcium: 9.3 mg/dL (ref 8.9–10.3)
Chloride: 106 mmol/L (ref 98–111)
Creatinine: 0.72 mg/dL (ref 0.44–1.00)
GFR, Estimated: >60 mL/min (ref >60)
Glucose, Bld: 131 mg/dL — ABNORMAL HIGH (ref 70–99)
Potassium: 4.2 mmol/L (ref 3.5–5.1)
Sodium: 139 mmol/L (ref 135–145)
Total Bilirubin: 0.5 mg/dL (ref 0.3–1.2)
Total Protein: 6.9 g/dL (ref 6.5–8.1)

## 2021-02-19 LAB — CBC WITH DIFFERENTIAL (CANCER CENTER ONLY)
Abs Immature Granulocytes: 0.02 10*3/uL (ref 0.00–0.07)
Basophils Absolute: 0.1 10*3/uL (ref 0.0–0.1)
Basophils Relative: 1 %
Eosinophils Absolute: 0.5 10*3/uL (ref 0.0–0.5)
Eosinophils Relative: 11 %
HCT: 33.6 % — ABNORMAL LOW (ref 36.0–46.0)
Hemoglobin: 11.1 g/dL — ABNORMAL LOW (ref 12.0–15.0)
Immature Granulocytes: 1 %
Lymphocytes Relative: 25 %
Lymphs Abs: 1.1 10*3/uL (ref 0.7–4.0)
MCH: 30.9 pg (ref 26.0–34.0)
MCHC: 33 g/dL (ref 30.0–36.0)
MCV: 93.6 fL (ref 80.0–100.0)
Monocytes Absolute: 0.3 10*3/uL (ref 0.1–1.0)
Monocytes Relative: 7 %
Neutro Abs: 2.3 10*3/uL (ref 1.7–7.7)
Neutrophils Relative %: 55 %
Platelet Count: 301 10*3/uL (ref 150–400)
RBC: 3.59 MIL/uL — ABNORMAL LOW (ref 3.87–5.11)
RDW: 13.7 % (ref 11.5–15.5)
WBC Count: 4.2 10*3/uL (ref 4.0–10.5)
nRBC: 0 % (ref 0.0–0.2)

## 2021-02-19 MED ORDER — DIPHENHYDRAMINE HCL 50 MG/ML IJ SOLN
INTRAMUSCULAR | Status: AC
  Filled 2021-02-19: qty 1

## 2021-02-19 MED ORDER — DEXAMETHASONE SODIUM PHOSPHATE 100 MG/10ML IJ SOLN
10.0000 mg | Freq: Once | INTRAMUSCULAR | Status: AC
  Administered 2021-02-19: 10 mg via INTRAVENOUS
  Filled 2021-02-19: qty 10

## 2021-02-19 MED ORDER — SODIUM CHLORIDE 0.9 % IV SOLN
65.0000 mg/m2 | Freq: Once | INTRAVENOUS | Status: AC
  Administered 2021-02-19: 144 mg via INTRAVENOUS
  Filled 2021-02-19: qty 24

## 2021-02-19 MED ORDER — SODIUM CHLORIDE 0.9% FLUSH
10.0000 mL | INTRAVENOUS | Status: DC | PRN
  Administered 2021-02-19: 10 mL
  Filled 2021-02-19: qty 10

## 2021-02-19 MED ORDER — SODIUM CHLORIDE 0.9% FLUSH
10.0000 mL | Freq: Once | INTRAVENOUS | Status: AC
  Administered 2021-02-19: 10 mL
  Filled 2021-02-19: qty 10

## 2021-02-19 MED ORDER — HEPARIN SOD (PORK) LOCK FLUSH 100 UNIT/ML IV SOLN
500.0000 [IU] | Freq: Once | INTRAVENOUS | Status: AC | PRN
  Administered 2021-02-19: 500 [IU]
  Filled 2021-02-19: qty 5

## 2021-02-19 MED ORDER — ONDANSETRON HCL 4 MG/2ML IJ SOLN
INTRAMUSCULAR | Status: AC
  Filled 2021-02-19: qty 4

## 2021-02-19 MED ORDER — FAMOTIDINE IN NACL 20-0.9 MG/50ML-% IV SOLN
20.0000 mg | Freq: Once | INTRAVENOUS | Status: AC
  Administered 2021-02-19: 20 mg via INTRAVENOUS

## 2021-02-19 MED ORDER — ONDANSETRON HCL 4 MG/2ML IJ SOLN
8.0000 mg | Freq: Once | INTRAMUSCULAR | Status: AC
  Administered 2021-02-19: 8 mg via INTRAVENOUS

## 2021-02-19 MED ORDER — SODIUM CHLORIDE 0.9 % IV SOLN
Freq: Once | INTRAVENOUS | Status: AC
  Filled 2021-02-19: qty 250

## 2021-02-19 MED ORDER — CEPHALEXIN 500 MG PO CAPS: 500.0000 mg | ORAL_CAPSULE | Freq: Two times a day (BID) | ORAL | 0 refills | Status: DC

## 2021-02-19 MED ORDER — DIPHENHYDRAMINE HCL 50 MG/ML IJ SOLN
25.0000 mg | Freq: Once | INTRAMUSCULAR | Status: AC
  Administered 2021-02-19: 25 mg via INTRAVENOUS

## 2021-02-19 MED ORDER — FAMOTIDINE IN NACL 20-0.9 MG/50ML-% IV SOLN
INTRAVENOUS | Status: AC
  Filled 2021-02-19: qty 50

## 2021-02-19 NOTE — Therapy (Signed)
[place], [place], [address] Phone: [phone]   Fax:  [phone]  Physical Therapy Treatment  Patient Details  Name: Lindsay Hampton MRN: [id number] Date of Birth: [birth year] Referring Provider (PT): Dr. [name]   Encounter Date: 02/19/2021   PT End of Session - 02/19/21 0954    Visit Number 6   # unchanged due to screen only   Number of Visits 10    Date for PT Re-Evaluation 11/09/20    PT Start Time 0947    PT Stop Time 0954    PT Time Calculation (min) 7 min    Activity Tolerance Patient tolerated treatment well    Behavior During Therapy [facility] for tasks assessed/performed           Past Medical History:  Diagnosis Date  . Allergy    seasonal  . Anemia    After childbirth  . Anxiety   . Breast cancer ([place])   . Family history of breast cancer 07/26/2020  . Family history of colon cancer 07/26/2020  . Family history of prostate cancer 07/26/2020  . GERD (gastroesophageal reflux disease)   . Hypertensive urgency 01/08/2021  . Pneumonia    In 20s    Past Surgical History:  Procedure Laterality Date  . AXILLARY LYMPH NODE DISSECTION Left 08/16/2020   Procedure: LEFT TARGETED AXILLARY LYMPH NODE DISSECTION;  Surgeon: [name], MD;  Location: [facility];  Service: General;  Laterality: Left;  . BREAST LUMPECTOMY WITH RADIOACTIVE SEED AND AXILLARY LYMPH NODE DISSECTION Left 08/16/2020   Procedure: LEFT BREAST LUMPECTOMY WITH RADIOACTIVE SEED;  Surgeon: [name], MD;  Location: [facility];  Service: General;  Laterality: Left;  PEC BLOCK  . BREAST SURGERY Left 06/2020   breast bx   . CERVICAL BIOPSY  W/ LOOP ELECTRODE EXCISION  12/2017  . CESAREAN SECTION    . CHOLECYSTECTOMY N/A 01/09/2021   Procedure: LAPAROSCOPIC CHOLECYSTECTOMY WITH INTRAOPERATIVE CHOLANGIOGRAM;  Surgeon: Donnie Mesa, MD;  Location[facility];  Service: General;  Laterality: N/A;  . DILATION AND CURETTAGE OF UTERUS    .  ERCP N/A 01/10/2021   Procedure: ENDOSCOPIC RETROGRADE CHOLANGIOPANCREATOGRAPHY (ERCP);  Surgeon: Ladene Artist, MD;  Location: [facility];  Service: Endoscopy;  Laterality: N/A;  . PORTACATH PLACEMENT  09/12/2020   Procedure: INSERTION PORT-A-CATH WITH ULTRASOUND;  Surgeon: [name], MD;  [facility];  Service: General;;  . Joan Mayans  01/10/2021   Procedure: SPHINCTEROTOMY;  Surgeon: Ladene Artist, MD;  [facility];  Service: Endoscopy;;  balloon sweep  . WISDOM TOOTH EXTRACTION  2020    There were no vitals filed for this visit.   Subjective Assessment - 02/19/21 0947    Subjective Pt returns for her 3 month L-Dex screen.    Pertinent History Patient was diagnosed on 07/06/2020 with left grade II invasive ductal carcinoma breast cancer. Patient reports she underwent a left lumpectomy and sentinel node biopsy (1/2 nodes positive) on 08/16/2020. It is ER/PR positive and HER2 negative with a Ki67 of 15%. She has a positive axillary lymph node.                  L-DEX FLOWSHEETS - 02/19/21 0900      L-DEX LYMPHEDEMA SCREENING   Measurement Type Unilateral    L-DEX MEASUREMENT EXTREMITY Upper Extremity    POSITION  Standing    DOMINANT SIDE Right    At Risk Side Left    BASELINE SCORE (UNILATERAL) 4.3  L-DEX SCORE (UNILATERAL) 2.7    VALUE CHANGE (UNILAT) -1.6                                  PT Long Term Goals - 10/12/20 1136      PT LONG TERM GOAL #1   Title Patient will demonstrate she has regained full shoulder ROM and function post operatively compared to baseline measurements.    Time 4    Period Weeks    Status Achieved      PT LONG TERM GOAL #2   Title Patient will report >/= 25% reduction in left shoulder pain while performing daily tasks.    Baseline 60-70% better    Time 4    Period Weeks    Status Achieved      PT LONG TERM GOAL #3   Title Patient will report >/= 25% improvement in her ability to  sleep at night    Baseline 60% better    Time 4    Period Weeks    Status Achieved      PT LONG TERM GOAL #4   Title 155 abd, 149 flex    Time 4    Status Achieved      PT LONG TERM GOAL #5   Title Pt will be fit for compression sleeve/gauntlet and will be independent with donning/doffing and proper wear    Time 4    Period Weeks    Status New    Target Date 11/09/20                 Plan - 02/19/21 0955    Clinical Impression Statement Pt returns for 3 month L-Dex screen. Her change from baseline of -1.6 is WNLs so no further treatment is required at this time except to cont every 3 month L-Dex screens which pt is agreeable to.    PT Next Visit Plan Cont every 3 month L-Dex screens for up to 2 years from her SLNB.    Consulted and Agree with Plan of Care Patient           Patient will benefit from skilled therapeutic intervention in order to improve the following deficits and impairments:     Visit Diagnosis: Aftercare following surgery for neoplasm     Problem List Patient Active Problem List   Diagnosis Date Noted  . Choledocholithiasis   . RUQ abdominal pain   . Hypokalemia   . Acute cholecystitis   . Abnormal cholangiogram   . Elevated LFTs   . RUQ pain 01/08/2021  . Hypertensive urgency 01/08/2021  . Elevated troponin 01/08/2021  . Acute biliary pancreatitis without infection or necrosis   . Port-A-Cath in place 11/20/2020  . Genetic testing 08/01/2020  . Family history of breast cancer 07/26/2020  . Family history of prostate cancer 07/26/2020  . Family history of colon cancer 07/26/2020  . Malignant neoplasm of lower-outer quadrant of left breast of female, estrogen receptor positive ([place]) 07/20/2020    Otelia Limes, PTA 02/19/2021, 9:56 AM  [place], [place], [address] Phone: [phone]   Fax:  [phone]  Name: CAELEIGH PROHASKA MRN:  [id number] Date of Birth: 30-Dec-1965

## 2021-02-19 NOTE — Patient Instructions (Signed)
Implanted Port Insertion, Care After This sheet gives you information about how to care for yourself after your procedure. Your health care provider may also give you more specific instructions. If you have problems or questions, contact your health care provider. What can I expect after the procedure? After the procedure, it is common to have:  Discomfort at the port insertion site.  Bruising on the skin over the port. This should improve over 3-4 days. Follow these instructions at home: Port care  After your port is placed, you will get a manufacturer's information card. The card has information about your port. Keep this card with you at all times.  Take care of the port as told by your health care provider. Ask your health care provider if you or a family member can get training for taking care of the port at home. A home health care nurse may also take care of the port.  Make sure to remember what type of port you have. Incision care  Follow instructions from your health care provider about how to take care of your port insertion site. Make sure you: ? Wash your hands with soap and water before and after you change your bandage (dressing). If soap and water are not available, use hand sanitizer. ? Change your dressing as told by your health care provider. ? Leave stitches (sutures), skin glue, or adhesive strips in place. These skin closures may need to stay in place for 2 weeks or longer. If adhesive strip edges start to loosen and curl up, you may trim the loose edges. Do not remove adhesive strips completely unless your health care provider tells you to do that.  Check your port insertion site every day for signs of infection. Check for: ? Redness, swelling, or pain. ? Fluid or blood. ? Warmth. ? Pus or a bad smell.      Activity  Return to your normal activities as told by your health care provider. Ask your health care provider what activities are safe for you.  Do not  lift anything that is heavier than 10 lb (4.5 kg), or the limit that you are told, until your health care provider says that it is safe. General instructions  Take over-the-counter and prescription medicines only as told by your health care provider.  Do not take baths, swim, or use a hot tub until your health care provider approves. Ask your health care provider if you may take showers. You may only be allowed to take sponge baths.  Do not drive for 24 hours if you were given a sedative during your procedure.  Wear a medical alert bracelet in case of an emergency. This will tell any health care providers that you have a port.  Keep all follow-up visits as told by your health care provider. This is important. Contact a health care provider if:  You cannot flush your port with saline as directed, or you cannot draw blood from the port.  You have a fever or chills.  You have redness, swelling, or pain around your port insertion site.  You have fluid or blood coming from your port insertion site.  Your port insertion site feels warm to the touch.  You have pus or a bad smell coming from the port insertion site. Get help right away if:  You have chest pain or shortness of breath.  You have bleeding from your port that you cannot control. Summary  Take care of the port as told by your   health care provider. Keep the manufacturer's information card with you at all times.  Change your dressing as told by your health care provider.  Contact a health care provider if you have a fever or chills or if you have redness, swelling, or pain around your port insertion site.  Keep all follow-up visits as told by your health care provider. This information is not intended to replace advice given to you by your health care provider. Make sure you discuss any questions you have with your health care provider. Document Revised: 06/09/2018 Document Reviewed: 06/09/2018 Elsevier Patient Education   2021 [employer]vier Inc.  

## 2021-02-19 NOTE — Patient Instructions (Signed)
[facility] Discharge Instructions for Patients Receiving Chemotherapy  Today you received the following chemotherapy agents:  Taxol.  To help prevent nausea and vomiting after your treatment, we encourage you to take your nausea medication as directed.   If you develop nausea and vomiting that is not controlled by your nausea medication, call the clinic.   BELOW ARE SYMPTOMS THAT SHOULD BE REPORTED IMMEDIATELY:  *FEVER GREATER THAN 100.5 F  *CHILLS WITH OR WITHOUT FEVER  NAUSEA AND VOMITING THAT IS NOT CONTROLLED WITH YOUR NAUSEA MEDICATION  *UNUSUAL SHORTNESS OF BREATH  *UNUSUAL BRUISING OR BLEEDING  TENDERNESS IN MOUTH AND THROAT WITH OR WITHOUT PRESENCE OF ULCERS  *URINARY PROBLEMS  *BOWEL PROBLEMS  UNUSUAL RASH Items with * indicate a potential emergency and should be followed up as soon as possible.  Feel free to call the clinic should you have any questions or concerns. The clinic phone number is [phone].  Please show the CHEMO ALERT CARD at check-in to the Emergency Department and triage nurse.   

## 2021-02-19 NOTE — H&P (Unsigned)
Lindsay Hampton is an 55 y.o. female.   Chief Complaint: Left breast cancer  HPI: Former patient of Dr. [name]  IDC Her 2 negative ER/PR positive with axillary mets - s/p lumpectomy and targeted ALND by Dr. [name] 08/16/20.  She has completed chemotherapy and is now ready for port removal.  S/p recent laparoscopic cholecystectomy  Past Medical History:  Diagnosis Date  . Allergy    seasonal  . Anemia    After childbirth  . Anxiety   . Breast cancer ([place])   . Family history of breast cancer 07/26/2020  . Family history of colon cancer 07/26/2020  . Family history of prostate cancer 07/26/2020  . GERD (gastroesophageal reflux disease)   . Hypertensive urgency 01/08/2021  . Pneumonia    In 20s    Past Surgical History:  Procedure Laterality Date  . AXILLARY LYMPH NODE DISSECTION Left 08/16/2020   Procedure: LEFT TARGETED AXILLARY LYMPH NODE DISSECTION;  Surgeon: [name], MD;  Location: [facility];  Service: General;  Laterality: Left;  . BREAST LUMPECTOMY WITH RADIOACTIVE SEED AND AXILLARY LYMPH NODE DISSECTION Left 08/16/2020   Procedure: LEFT BREAST LUMPECTOMY WITH RADIOACTIVE SEED;  Surgeon: [name], MD;  Location: [facility];  Service: General;  Laterality: Left;  PEC BLOCK  . BREAST SURGERY Left 06/2020   breast bx   . CERVICAL BIOPSY  W/ LOOP ELECTRODE EXCISION  12/2017  . CESAREAN SECTION    . CHOLECYSTECTOMY N/A 01/09/2021   Procedure: LAPAROSCOPIC CHOLECYSTECTOMY WITH INTRAOPERATIVE CHOLANGIOGRAM;  Surgeon: Donnie Mesa, MD;  [facility];  Service: General;  Laterality: N/A;  . DILATION AND CURETTAGE OF UTERUS    . ERCP N/A 01/10/2021   Procedure: ENDOSCOPIC RETROGRADE CHOLANGIOPANCREATOGRAPHY (ERCP);  Surgeon: Ladene Artist, MD;  Location: [facility];  Service: Endoscopy;  Laterality: N/A;  . PORTACATH PLACEMENT  09/12/2020   Procedure: INSERTION PORT-A-CATH WITH ULTRASOUND;  Surgeon: [name], MD;  [facility];  Service: General;;  . Joan Mayans   01/10/2021   Procedure: SPHINCTEROTOMY;  Surgeon: Ladene Artist, MD;  [facility];  Service: Endoscopy;;  balloon sweep  . WISDOM TOOTH EXTRACTION  2020    Family History  Problem Relation Age of Onset  . Breast cancer Mother        dx 62  . Prostate cancer Father        dx late 50s/early 47s  . Colon cancer Maternal Grandmother        early 39s  . Breast cancer Cousin        dx 48  . Esophageal cancer Maternal Grandfather        dx 76s  . Cancer Maternal Aunt        maternal grandmother's sister; possible ovarian? dx 63, d. 53s-80s  . Rectal cancer Neg Hx   . Stomach cancer Neg Hx    Social History:  reports that she has never smoked. She has never used smokeless tobacco. She reports previous alcohol use. She reports that she does not use drugs.  Allergies: No Known Allergies  Prior to Admission medications   Medication Sig Start Date End Date Taking? Authorizing Provider  acetaminophen (TYLENOL) 325 MG tablet You can take 2 tablets every 4 hours as needed for pain.  Do not take over 4000 mg of Tylenol/acetaminophen per day.  Tylenol#3 is Tylenol and codeine.  You need to count each of these tablets as part of your daily maximum of Tylenol. 01/11/21   Earnstine Regal, PA-C  acetaminophen-codeine (  TYLENOL #3) 300-30 MG tablet Take 1 tablet by mouth every 6 (six) hours as needed for severe pain (pain not relieved by plain Tylenol). 01/11/21   Earnstine Regal, PA-C  ALPRAZolam (XANAX) 0.25 MG tablet TAKE 1 TABLET BY MOUTH 2 TIMES DAILY AS NEEDED FOR ANXIETY. 02/07/21   [name], MD  cephALEXin (KEFLEX) 500 MG capsule Take 1 capsule (500 mg total) by mouth 2 (two) times daily. 02/19/21   [name], MD  dexamethasone (DECADRON) 4 MG tablet Take 1 tablet (4 mg total) by mouth as needed (daily as needed for nausea). 12/13/20   [name], MD  lidocaine-prilocaine (EMLA) cream Apply to affected area once Patient taking differently: Apply 1 application topically as  needed (port access). 09/04/20   [name], MD  metoprolol tartrate (LOPRESSOR) 25 MG tablet Take 0.5 tablets (12.5 mg total) by mouth 2 (two) times daily. 01/11/21   Eugenie Filler, MD  ondansetron (ZOFRAN-ODT) 8 MG disintegrating tablet TAKE 1 TABLET BY MOUTH EVERY 8 HOURS AS NEEDED FOR NAUSEA OR VOMITING. Patient taking differently: Take 8 mg by mouth every 8 (eight) hours as needed for nausea or vomiting. 01/02/21   [name], MD  pantoprazole (PROTONIX) 40 MG tablet Take 1 tablet (40 mg total) by mouth daily. 01/12/21   Eugenie Filler, MD  prochlorperazine (COMPAZINE) 10 MG tablet Take 1 tablet (10 mg total) by mouth every 6 (six) hours as needed (Nausea or vomiting). 11/28/20   [name], MD  sodium chloride (OCEAN) 0.65 % SOLN nasal spray Place 1 spray into both nostrils as needed for congestion.    [provider]     Results for orders placed or performed in visit on 02/19/21 (from the past 48 hour(s))  CBC with Differential ([facility] Only)     Status: Abnormal   Collection Time: 02/19/21 10:45 AM  Result Value Ref Range   WBC Count 4.2 4.0 - 10.5 K/uL   RBC 3.59 (L) 3.87 - 5.11 MIL/uL   Hemoglobin 11.1 (L) 12.0 - 15.0 g/dL   HCT 33.6 (L) 36.0 - 46.0 %   MCV 93.6 80.0 - 100.0 fL   MCH 30.9 26.0 - 34.0 pg   MCHC 33.0 30.0 - 36.0 g/dL   RDW 13.7 11.5 - 15.5 %   Platelet Count 301 150 - 400 K/uL   nRBC 0.0 0.0 - 0.2 %   Neutrophils Relative % 55 %   Neutro Abs 2.3 1.7 - 7.7 K/uL   Lymphocytes Relative 25 %   Lymphs Abs 1.1 0.7 - 4.0 K/uL   Monocytes Relative 7 %   Monocytes Absolute 0.3 0.1 - 1.0 K/uL   Eosinophils Relative 11 %   Eosinophils Absolute 0.5 0.0 - 0.5 K/uL   Basophils Relative 1 %   Basophils Absolute 0.1 0.0 - 0.1 K/uL   Immature Granulocytes 1 %   Abs Immature Granulocytes 0.02 0.00 - 0.07 K/uL    Comment: Performed at [facility], [address], [place], [place] [address]   No results found.   There  were no vitals taken for this visit. Physical Exam  General: pleasant, WD, WN  NAD HEENT: head is normocephalic, atraumatic.  Sclera are noninjected.  Pupils are equal.  Ears and nose without any masses or lesions.  Mouth is pink and moist Heart: regular, rate, and rhythm.  Normal s1,s2. No obvious murmurs, gallops, or rubs noted.  Palpable radial and pedal pulses bilaterally Lungs: CTAB, no wheezes, rhonchi, or  rales noted.  Respiratory effort nonlabored.  Port right chest Abd: soft,non-tender, healed incisions, ND, +BS, no masses, hernias, or organomegaly MS: all 4 extremities are symmetrical with no cyanosis, clubbing, or edema. Skin: warm and dry with no masses, lesions, or rashes Neuro: Cranial nerves 2-12 grossly intact, sensation is normal throughout Psych: A&Ox3 with an appropriate affect.   Assessment/Plan Port removal  The surgical procedure has been discussed with the patient.  Potential risks, benefits, alternative treatments, and expected outcomes have been explained.  All of the patient's questions at this time have been answered.  The likelihood of reaching the patient's treatment goal is good.  The patient understand the proposed surgical procedure and wishes to proceed.   Maia Petties, MD 02/19/2021, 12:03 PM

## 2021-02-19 NOTE — Telephone Encounter (Signed)
Spoke with patient in regards to telephone visit with Shona Simpson PA on 02/20/21 @ 10:30am. Advised patient is a telephone visit and patient  verbalized understanding of appointment date and time. Reviewed meaningful use questions. TM

## 2021-02-20 ENCOUNTER — Ambulatory Visit
Admission: RE | Admit: 2021-02-20 | Discharge: 2021-02-20 | Disposition: A | Discharge status: Home / Self Care | Payer: 59 | Source: Ambulatory Visit | Attending: Radiation Oncology | Admitting: Radiation Oncology

## 2021-02-20 DIAGNOSIS — Z170 Estrogen receptor positive status [ER+]: Secondary | ICD-10-CM

## 2021-02-20 DIAGNOSIS — C50512 Malignant neoplasm of lower-outer quadrant of left female breast: Secondary | ICD-10-CM

## 2021-02-20 NOTE — Progress Notes (Signed)
Radiation Oncology         ([phone]) [phone] ________________________________  Outpatient Follow Up - Conducted via telephone due to current COVID-19 concerns for limiting patient exposure  I spoke with the patient to conduct this consult visit via telephone to spare the patient unnecessary potential exposure in the healthcare setting during the current COVID-19 pandemic. The patient was notified in advance and was offered a [place] meeting to allow for face to face communication but unfortunately reported that they did not have the appropriate resources/technology to support such a visit and instead preferred to proceed with a telephone visit.   Name: Lindsay Hampton        MRN: [id number]  Date of Service: 02/20/2021 DOB: Nov 19, 1966  VZ:DGLOVF, Marvis Repress, FNP  [name], MD     REFERRING PHYSICIAN: [name], MD   DIAGNOSIS: The encounter diagnosis was Malignant neoplasm of lower-outer quadrant of left breast of female, estrogen receptor positive ([place]).   HISTORY OF PRESENT ILLNESS: Lindsay Hampton is a 55 y.o. female originally seen in the multidisciplinary breast clinic for a new diagnosis of left breast cancer. The patient was noted to have a screening detected distortion in the left breast. She proceeded with diagnostic imaging which revealed a mass at 3:30 measuring 24 x 2.2 x 1.8 cm and one abnormal appearing axillary node was noted. A biopsy on 07/17/20 revealed a grade 2, invasive ductal carcinoma with associated DCIS. Her tumor was ER/PR positive, and HER2 was equivocal by IHC, but negative by FISH, her ratio and copy rate was discussed as being negative overall. An axillary node was also biopsied and was positive for carcinoma.    Since her last visit she underwent left lumpectomy with sentinel lymph node biopsy on 08/16/2020 which revealed a grade 2 invasive ductal carcinoma that measured 2.3 cm with associated DCIS.  Her posterior margin was approximately 1 mm from the invasive  component.  1 of 2 sampled left axillary nodes were positive for carcinoma, her MammaPrint test was high risk and she began systemic chemotherapy on 09/13/2020.  Her second phase of treatment with Taxol was interrupted in February due to an admission for acute cholecystitis for which she underwent cholecystectomy on 01/09/2021.  She was able to complete her remaining chemotherapy and her last treatment date was on 02/19/2021.  She is seen today to discuss adjuvant radiotherapy.    PREVIOUS RADIATION THERAPY: No   PAST MEDICAL HISTORY:  Past Medical History:  Diagnosis Date  . Allergy    seasonal  . Anemia    After childbirth  . Anxiety   . Breast cancer ([place])   . Family history of breast cancer 07/26/2020  . Family history of colon cancer 07/26/2020  . Family history of prostate cancer 07/26/2020  . GERD (gastroesophageal reflux disease)   . Hypertensive urgency 01/08/2021  . Pneumonia    In 68s       PAST SURGICAL HISTORY: Past Surgical History:  Procedure Laterality Date  . AXILLARY LYMPH NODE DISSECTION Left 08/16/2020   Procedure: LEFT TARGETED AXILLARY LYMPH NODE DISSECTION;  Surgeon: [name], MD;  Location: [facility];  Service: General;  Laterality: Left;  . BREAST LUMPECTOMY WITH RADIOACTIVE SEED AND AXILLARY LYMPH NODE DISSECTION Left 08/16/2020   Procedure: LEFT BREAST LUMPECTOMY WITH RADIOACTIVE SEED;  Surgeon: [name], MD;  Location: [facility];  Service: General;  Laterality: Left;  PEC BLOCK  . BREAST SURGERY Left 06/2020   breast bx   . CERVICAL BIOPSY  W/ LOOP ELECTRODE EXCISION  12/2017  . CESAREAN SECTION    . CHOLECYSTECTOMY N/A 01/09/2021   Procedure: LAPAROSCOPIC CHOLECYSTECTOMY WITH INTRAOPERATIVE CHOLANGIOGRAM;  Surgeon: Donnie Mesa, MD;  Location: [facility];  Service: General;  Laterality: N/A;  . DILATION AND CURETTAGE OF UTERUS    . ERCP N/A 01/10/2021   Procedure: ENDOSCOPIC RETROGRADE CHOLANGIOPANCREATOGRAPHY (ERCP);  Surgeon: Ladene Artist, MD;  Location:  [facility];  Service: Endoscopy;  Laterality: N/A;  . PORTACATH PLACEMENT  09/12/2020   Procedure: INSERTION PORT-A-CATH WITH ULTRASOUND;  Surgeon: [name], MD;  Location: [facility];  Service: General;;  . Joan Mayans  01/10/2021   Procedure: SPHINCTEROTOMY;  Surgeon: Ladene Artist, MD;  Location: [facility];  Service: Endoscopy;;  balloon sweep  . WISDOM TOOTH EXTRACTION  2020     FAMILY HISTORY:  Family History  Problem Relation Age of Onset  . Breast cancer Mother        dx 86  . Prostate cancer Father        dx late 50s/early 77s  . Colon cancer Maternal Grandmother        early 34s  . Breast cancer Cousin        dx 28  . Esophageal cancer Maternal Grandfather        dx 76s  . Cancer Maternal Aunt        maternal grandmother's sister; possible ovarian? dx 53, d. 25s-80s  . Rectal cancer Neg Hx   . Stomach cancer Neg Hx      SOCIAL HISTORY:  reports that she has never smoked. She has never used smokeless tobacco. She reports previous alcohol use. She reports that she does not use drugs.  The patient is married and lives in [place]. 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  ALLERGIES: Patient has no known allergies.   MEDICATIONS:  Current Outpatient Medications  Medication Sig Dispense Refill  . acetaminophen (TYLENOL) 325 MG tablet You can take 2 tablets every 4 hours as needed for pain.  Do not take over 4000 mg of Tylenol/acetaminophen per day.  Tylenol#3 is Tylenol and codeine.  You need to count each of these tablets as part of your daily maximum of Tylenol.    Marland Kitchen acetaminophen-codeine (TYLENOL #3) 300-30 MG tablet Take 1 tablet by mouth every 6 (six) hours as needed for severe pain (pain not relieved by plain Tylenol). 15 tablet 0  . ALPRAZolam (XANAX) 0.25 MG tablet TAKE 1 TABLET BY MOUTH 2 TIMES DAILY AS NEEDED FOR ANXIETY. 60 tablet 3  . dexamethasone (DECADRON) 4 MG tablet Take 1 tablet (4 mg total) by mouth as needed (daily as needed for nausea).  30 tablet 0  . lidocaine-prilocaine (EMLA) cream Apply to affected area once (Patient taking differently: Apply 1 application topically as needed (port access).) 30 g 3  . metoprolol tartrate (LOPRESSOR) 25 MG tablet Take 0.5 tablets (12.5 mg total) by mouth 2 (two) times daily. 60 tablet 1  . ondansetron (ZOFRAN-ODT) 8 MG disintegrating tablet TAKE 1 TABLET BY MOUTH EVERY 8 HOURS AS NEEDED FOR NAUSEA OR VOMITING. (Patient taking differently: Take 8 mg by mouth every 8 (eight) hours as needed for nausea or vomiting.) 20 tablet 1  . pantoprazole (PROTONIX) 40 MG tablet Take 1 tablet (40 mg total) by mouth daily. 30 tablet 1  . prochlorperazine (COMPAZINE) 10 MG tablet Take 1 tablet (10 mg total) by mouth every 6 (six) hours as needed (Nausea or vomiting). 30 tablet 1  . sodium  chloride (OCEAN) 0.65 % SOLN nasal spray Place 1 spray into both nostrils as needed for congestion.    . cephALEXin (KEFLEX) 500 MG capsule Take 1 capsule (500 mg total) by mouth 2 (two) times daily. 14 capsule 0   No current facility-administered medications for this encounter.     REVIEW OF SYSTEMS: On review of systems, the patient reports that she is doing well overall. She has recovered well since her cholecystectomy and since completing chemotherapy yesterday. She is feeling great and ready to move forward with radiation at the appropriate time.  No other complaints are noted.  PHYSICAL EXAM:  Unable to assess due to encounter type.   ECOG = 0  0 - Asymptomatic (Fully active, able to carry on all predisease activities without restriction)  1 - Symptomatic but completely ambulatory (Restricted in physically strenuous activity but ambulatory and able to carry out work of a light or sedentary nature. For example, light housework, office work)  2 - Symptomatic, <50% in bed during the day (Ambulatory and capable of all self care but unable to carry out any work activities. Up and about more than 50% of waking  hours)  3 - Symptomatic, >50% in bed, but not bedbound (Capable of only limited self-care, confined to bed or chair 50% or more of waking hours)  4 - Bedbound (Completely disabled. Cannot carry on any self-care. Totally confined to bed or chair)  5 - Death   Eustace Pen MM, Creech RH, Tormey DC, et al. [phone]). "Toxicity and response criteria of the [facility]". [place] Oncol. 5 (6): 649-55    LABORATORY DATA:  Lab Results  Component Value Date   WBC 4.2 02/19/2021   HGB 11.1 (L) 02/19/2021   HCT 33.6 (L) 02/19/2021   MCV 93.6 02/19/2021   PLT 301 02/19/2021   Lab Results  Component Value Date   NA 139 02/19/2021   K 4.2 02/19/2021   CL 106 02/19/2021   CO2 23 02/19/2021   Lab Results  Component Value Date   ALT 35 02/19/2021   AST 25 02/19/2021   ALKPHOS 62 02/19/2021   BILITOT 0.5 02/19/2021      RADIOGRAPHY: No results found.     IMPRESSION/PLAN: 1. Stage IIA, cT2N1M0 grade 2, ER/PR positive invasive ductal carcinoma of the left breast. Dr. Lisbeth Renshaw discusses the final pathology findings and reviews the nature of left node positive breast disease. She has completed surgery and adjuvant chemotherapy. Dr. Lisbeth Renshaw reviews the rationale for external radiotherapy to the breast and regional nodes followed by antiestrogen therapy. We discussed the risks, benefits, short, and long term effects of radiotherapy, and the patient is interested in proceeding. Dr. Lisbeth Renshaw discusses the delivery and logistics of radiotherapy and anticipates a course of 6 1/2 weeks of radiotherapy to the left breast and regional nodes with deep inspiration breath hold technique. She will simulate on 03/19/21 at which time she will sign written consent to proceed.    Given current concerns for patient exposure during the COVID-19 pandemic, this encounter was conducted via telephone.  The patient has provided two factor identification and has given verbal consent for this type of encounter  and has been advised to only accept a meeting of this type in a secure network environment. The time spent during this encounter was 45 minutes including preparation, discussion, and coordination of the patient's care. The attendants for this meeting include Blenda Nicely, RN, Dr. Lisbeth Renshaw, Hayden Pedro  and Mina Marble.  During the encounter,  Blenda Nicely, RN, Dr. Lisbeth Renshaw, and Hayden Pedro were located at [facility] Radiation Oncology Department.  Evangela B Garrido was located at home.    The above documentation reflects my direct findings during this shared patient visit. Please see the separate note by Dr. Lisbeth Renshaw on this date for the remainder of the patient's plan of care.    Carola Rhine, PAC

## 2021-02-22 ENCOUNTER — Ambulatory Visit: Payer: 59 | Admitting: Radiation Oncology

## 2021-02-28 ENCOUNTER — Telehealth: Payer: Self-pay | Admitting: *Deleted

## 2021-02-28 NOTE — Telephone Encounter (Signed)
Received call from pt with complaint of intermittent mild tenderness behind right ear. Pt denies recent injury, trauma, nodules, or rash.  Pt states she will rub the area to alleviate tenderness.  Per MD pt may be experiencing tenderness in the scalp due to hair regrowth after completion of chemotherapy.  Pt educated to monitor the area and call the office if a rash or lump does appear.  Pt verbalized understanding and appreciative of the advice.

## 2021-03-05 ENCOUNTER — Encounter: Payer: Self-pay | Admitting: *Deleted

## 2021-03-16 ENCOUNTER — Telehealth: Payer: Self-pay | Admitting: Adult Health

## 2021-03-16 ENCOUNTER — Telehealth: Payer: Self-pay | Admitting: *Deleted

## 2021-03-16 ENCOUNTER — Other Ambulatory Visit: Payer: Self-pay | Admitting: Oncology

## 2021-03-16 MED ORDER — VALACYCLOVIR HCL 1 G PO TABS: 1000.0000 mg | ORAL_TABLET | Freq: Three times a day (TID) | ORAL | 0 refills | Status: DC

## 2021-03-16 NOTE — Telephone Encounter (Signed)
Scheduled appt per 4/22 sch msg. Pt aware.  

## 2021-03-16 NOTE — Progress Notes (Unsigned)
Ms Jessop called last night c/o new onset shingles. Valtrex prescribed; she was directed to come to office or see her PCP today

## 2021-03-16 NOTE — Telephone Encounter (Signed)
Spoke with the patient to let her know that per discussion with the PA we are pushing her CT simulation out 2 weeks.  She was informed the simulation staff have been made aware and will call her with a new date and time.  She verbalized understanding.  Will continue to follow as necessary.  Gloriajean Dell. Leonie Green, BSN

## 2021-03-19 ENCOUNTER — Ambulatory Visit: Payer: 59 | Admitting: Radiation Oncology

## 2021-03-22 ENCOUNTER — Telehealth: Payer: Self-pay | Admitting: *Deleted

## 2021-03-22 NOTE — Telephone Encounter (Signed)
Pt called to report shingles on the right side of her neck is starting to heal and develop a "scab".  Pt states right neck is more sensitive and experiences an intermittent stinging sensation.  Pt requesting advice from MD if this is common with shingles.  Per MD neuralgia is common with shingles and can be present after the rash clears up.   Pt scheduled to f/u with NP next week. Pt educated to monitor rash and to call the office if it becomes worse or spreads. Pt verbalized understanding and appreciative of advice.

## 2021-03-26 ENCOUNTER — Encounter: Payer: Self-pay | Admitting: *Deleted

## 2021-03-29 ENCOUNTER — Encounter: Payer: Self-pay | Admitting: Adult Health

## 2021-03-29 ENCOUNTER — Ambulatory Visit
Admission: RE | Admit: 2021-03-29 | Discharge: 2021-03-29 | Disposition: A | Discharge status: Home / Self Care | Payer: 59 | Source: Ambulatory Visit | Attending: Radiation Oncology | Admitting: Radiation Oncology

## 2021-03-29 ENCOUNTER — Inpatient Hospital Stay: Payer: 59 | Attending: Hematology and Oncology | Admitting: Adult Health

## 2021-03-29 ENCOUNTER — Other Ambulatory Visit: Payer: Self-pay

## 2021-03-29 VITALS — BP 138/82 | HR 82 | Temp 97.4°F | Resp 18 | Ht 66.0 in | Wt 217.2 lb

## 2021-03-29 DIAGNOSIS — T451X5A Adverse effect of antineoplastic and immunosuppressive drugs, initial encounter: Secondary | ICD-10-CM | POA: Diagnosis not present

## 2021-03-29 DIAGNOSIS — Z803 Family history of malignant neoplasm of breast: Secondary | ICD-10-CM | POA: Diagnosis not present

## 2021-03-29 DIAGNOSIS — Z800 Family history of malignant neoplasm of digestive organs: Secondary | ICD-10-CM | POA: Insufficient documentation

## 2021-03-29 DIAGNOSIS — C50512 Malignant neoplasm of lower-outer quadrant of left female breast: Secondary | ICD-10-CM

## 2021-03-29 DIAGNOSIS — Z9221 Personal history of antineoplastic chemotherapy: Secondary | ICD-10-CM | POA: Diagnosis not present

## 2021-03-29 DIAGNOSIS — D6481 Anemia due to antineoplastic chemotherapy: Secondary | ICD-10-CM | POA: Diagnosis not present

## 2021-03-29 DIAGNOSIS — Z170 Estrogen receptor positive status [ER+]: Secondary | ICD-10-CM | POA: Diagnosis not present

## 2021-03-29 DIAGNOSIS — Z79899 Other long term (current) drug therapy: Secondary | ICD-10-CM | POA: Insufficient documentation

## 2021-03-29 DIAGNOSIS — Z510 Encounter for antineoplastic radiation therapy: Secondary | ICD-10-CM | POA: Diagnosis not present

## 2021-03-29 DIAGNOSIS — Z8042 Family history of malignant neoplasm of prostate: Secondary | ICD-10-CM | POA: Insufficient documentation

## 2021-03-29 NOTE — Assessment & Plan Note (Signed)
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 T2 N1 M0 stage Ib MammaPrint: High risk (average 10-year risk untreated: 29%, predicted benefit of chemo with hormone therapy at 5 years: 94.6%  Treatment plan: 1.Adjuvant chemotherapy withdose dense Adriamycin and Cytoxan x4 followed by Taxol   2.Followed by adjuvant radiation 3.Followed byadjuvant antiestrogen therapy ------------------------------------------------------------------------------------------------------------------------------------ Current treatment:Completed 4 cycles ofdose Adriamycin and Cytoxan, today cycle12Taxol Echocardiogram 09/08/2020: EF 60 to 65%  Chemo toxicities: 1.Severe nausea and vomiting:Resolved 2.abdominal distention and bloating:Resolved 3.elevated LFTs: Normalized 4.chemo induced anemia: Monitoring today's hemoglobin is 11.9  Hospitalization 01/08/2021-01/11/2021: Laparoscopic cholecystectomy on 01/09/2021, ERCP with sphincterotomy 01/10/2021  Angelice's radiation was delayed by her shingles diagnosis however her skin has improved, and only has some mild flakiness to it.  She will proceed with radiation and port removal as scheduled.    I reviewed with her the differences in tamoxifen versus anastrozole and why menopausal status matters.  After discussion, we decided to get labs later this month after one of her radiation appoitnemnts to evaluate her Floyd, LH and estradiol levels.  I also will get a CBC and CMET to f/u on her counts since she has been off of chemotherapy since March.  We talked about moving forward with life after cancer and fear of recurrence. I encouraged her to be on the look out for our [facility] classes as she would likely enjoy these very much.    [place] will see Dr. Lindi Adie week of 05/14/2021.   I will see her for her SCP visit week in late July/early August.  She knows to call for any questions that may arise between now and her next appointment.  We are happy to see her sooner if needed.

## 2021-03-29 NOTE — Progress Notes (Signed)
[facility] Follow up:    Lindsay Hampton, [address] [place] [address]   DIAGNOSIS: Cancer Staging Malignant neoplasm of lower-outer quadrant of left breast of female, estrogen receptor positive ([place]) Staging form: Breast, AJCC 8th Edition - Clinical stage from 07/26/2020: Stage IIA (cT2, cN1(f), cM0, G2, ER+, PR+, HER2-) - Signed by [name], MD on 07/26/2020 Stage prefix: Initial diagnosis Method of lymph node assessment: Core biopsy Histologic grading system: 3 grade system - Pathologic stage from 08/24/2020: Stage IB (pT2, pN1a, cM0, G2, ER+, PR+, HER2-) - Signed by [name], MD on 08/24/2020 Stage prefix: Initial diagnosis Histologic grading system: 3 grade system   SUMMARY OF ONCOLOGIC HISTORY: Oncology History  Malignant neoplasm of lower-outer quadrant of left breast of female, estrogen receptor positive ([employer])  07/20/2020 Initial Diagnosis   Screening mammogram detected left breast calcifications, enlarged left axillary lymph nodes, and an asymmetry in the right breast. Diagnostic mammogram showed in the left breast, a 2.4cm mass with calcifications, 3:30 position, a left axillary lymph node with cortical thickness, and in the right breast, no suspicious masses or calcifications. Left breast biopsy showed invasive and in situ mammary carcinoma in the breast and axilla, grade 2, HER-2 equivocal by IHC (2+), negative by FISH, ER+ 95%, PR+40%, Ki67 15%.    07/26/2020 Cancer Staging   Staging form: Breast, AJCC 8th Edition - Clinical stage from 07/26/2020: Stage IIA (cT2, cN1(f), cM0, G2, ER+, PR+, HER2-) - Signed by [name], MD on 07/26/2020   08/01/2020 Genetic Testing   No pathogenic variants detected in [facility] Multi-Cancer Panel.  The Multi-Cancer Panel offered by [facility] includes sequencing and/or deletion duplication testing of the following 85 genes: AIP, ALK, APC, ATM, AXIN2,BAP1,  BARD1, BLM, BMPR1A, BRCA1, BRCA2,  BRIP1, CASR, CDC73, CDH1, CDK4, CDKN1B, CDKN1C, CDKN2A (p14ARF), CDKN2A (p16INK4a), CEBPA, CHEK2, CTNNA1, DICER1, DIS3L2, EGFR (c.2369C>T, p.Thr790Met variant only), EPCAM (Deletion/duplication testing only), FH, FLCN, GATA2, GPC3, GREM1 (Promoter region deletion/duplication testing only), HOXB13 (c.251G>A, p.Gly84Glu), HRAS, KIT, MAX, MEN1, MET, MITF (c.952G>A, p.Glu318Lys variant only), MLH1, MSH2, MSH3, MSH6, MUTYH, NBN, NF1, NF2, NTHL1, PALB2, PDGFRA, PHOX2B, PMS2, POLD1, POLE, POT1, PRKAR1A, PTCH1, PTEN, RAD50, RAD51C, RAD51D, RB1, RECQL4, RET, RNF43, RUNX1, SDHAF2, SDHA (sequence changes only), SDHB, SDHC, SDHD, SMAD4, SMARCA4, SMARCB1, SMARCE1, STK11, SUFU, TERC, TERT, TMEM127, TP53, TSC1, TSC2, VHL, WRN and WT1.  The report date is August 01, 2020.    08/16/2020 Surgery   Left lumpectomy [name]): IDC, grade 2, 2.3cm, with intermediate grade DCIS, clear margins, 1/2 left axillary lymph nodes positive for carcinoma.HER-2 equivocal by IHC (2+), negative by FISH, ER+ 95%, PR+40%, Ki67 15%.    08/24/2020 Cancer Staging   Staging form: Breast, AJCC 8th Edition - Pathologic stage from 08/24/2020: Stage IB (pT2, pN1a, cM0, G2, ER+, PR+, HER2-) - Signed by [name], MD on 08/24/2020   08/30/2020 Miscellaneous   MammaPrint high risk   09/13/2020 - 02/19/2021 Chemotherapy           CURRENT THERAPY: radiation therapy to start next week  INTERVAL HISTORY: Lindsay Hampton 55 y.o. female returns for urgent evaluation of shingles that she experienced about 10-15 days ago.  She called Dr. Lindi Adie and started Valtrex which helped.  She did have some sharp and stabbing pain in her right neck extending down into her SCV area, however that has improved.  Her skin is healing and she has no open areas on her skin.  She has some questions  about her antiestrogen therapy moving forward.  Her LMP was in 07/2020 prior to her starting chemotherapy.     Patient Active Problem List   Diagnosis Date Noted  .  Choledocholithiasis   . RUQ abdominal pain   . Hypokalemia   . Acute cholecystitis   . Abnormal cholangiogram   . Elevated LFTs   . RUQ pain 01/08/2021  . Hypertensive urgency 01/08/2021  . Elevated troponin 01/08/2021  . Acute biliary pancreatitis without infection or necrosis   . Port-A-Cath in place 11/20/2020  . Genetic testing 08/01/2020  . Family history of breast cancer 07/26/2020  . Family history of prostate cancer 07/26/2020  . Family history of colon cancer 07/26/2020  . Malignant neoplasm of lower-outer quadrant of left breast of female, estrogen receptor positive ([place]) 07/20/2020    has No Known Allergies.  MEDICAL HISTORY: Past Medical History:  Diagnosis Date  . Allergy    seasonal  . Anemia    After childbirth  . Anxiety   . Breast cancer ([place])   . Family history of breast cancer 07/26/2020  . Family history of colon cancer 07/26/2020  . Family history of prostate cancer 07/26/2020  . GERD (gastroesophageal reflux disease)   . Hypertensive urgency 01/08/2021  . Pneumonia    In 20s    SURGICAL HISTORY: Past Surgical History:  Procedure Laterality Date  . AXILLARY LYMPH NODE DISSECTION Left 08/16/2020   Procedure: LEFT TARGETED AXILLARY LYMPH NODE DISSECTION;  Surgeon: [name], MD;  Location: [facility];  Service: General;  Laterality: Left;  . BREAST LUMPECTOMY WITH RADIOACTIVE SEED AND AXILLARY LYMPH NODE DISSECTION Left 08/16/2020   Procedure: LEFT BREAST LUMPECTOMY WITH RADIOACTIVE SEED;  Surgeon: [name], MD;  Location: [facility];  Service: General;  Laterality: Left;  PEC BLOCK  . BREAST SURGERY Left 06/2020   breast bx   . CERVICAL BIOPSY  W/ LOOP ELECTRODE EXCISION  12/2017  . CESAREAN SECTION    . CHOLECYSTECTOMY N/A 01/09/2021   Procedure: LAPAROSCOPIC CHOLECYSTECTOMY WITH INTRAOPERATIVE CHOLANGIOGRAM;  Surgeon: Donnie Mesa, MD;  Location: [facility];  Service: General;  Laterality: N/A;  . DILATION AND CURETTAGE OF UTERUS    . ERCP N/A 01/10/2021    Procedure: ENDOSCOPIC RETROGRADE CHOLANGIOPANCREATOGRAPHY (ERCP);  Surgeon: Ladene Artist, MD;  Location: [facility];  Service: Endoscopy;  Laterality: N/A;  . PORTACATH PLACEMENT  09/12/2020   Procedure: INSERTION PORT-A-CATH WITH ULTRASOUND;  Surgeon: [name], MD;  Location: [facility];  Service: General;;  . Joan Mayans  01/10/2021   Procedure: SPHINCTEROTOMY;  Surgeon: Ladene Artist, MD;  Location: [facility];  Service: Endoscopy;;  balloon sweep  . WISDOM TOOTH EXTRACTION  2020    SOCIAL HISTORY: Social History   Socioeconomic History  . Marital status: Married    Spouse name: Not on file  . Number of children: Not on file  . Years of education: Not on file  . Highest education level: Not on file  Occupational History  . Not on file  Tobacco Use  . Smoking status: Never Smoker  . Smokeless tobacco: Never Used  Vaping Use  . Vaping Use: Never used  Substance and Sexual Activity  . Alcohol use: Not Currently    Comment: not since breast cancer dx  . Drug use: No  . Sexual activity: Not Currently    Partners: Male    Birth control/protection: None  Other Topics Concern  . Not on file  Social History Narrative  . Not on file  Social Determinants of Health   Financial Resource Strain: Low Risk   . Difficulty of Paying Living Expenses: Not hard at all  Food Insecurity: No Food Insecurity  . Worried About Charity fundraiser in the Last Year: Never true  . Ran Out of Food in the Last Year: Never true  Transportation Needs: No Transportation Needs  . Lack of Transportation (Medical): No  . Lack of Transportation (Non-Medical): No  Physical Activity: Not on file  Stress: Not on file  Social Connections: Not on file  Intimate Partner Violence: Not on file    FAMILY HISTORY: Family History  Problem Relation Age of Onset  . Breast cancer Mother        dx 53  . Prostate cancer Father        dx late 50s/early 71s  . Colon cancer Maternal Grandmother         early 1s  . Breast cancer Cousin        dx 48  . Esophageal cancer Maternal Grandfather        dx 32s  . Cancer Maternal Aunt        maternal grandmother's sister; possible ovarian? dx 46, d. 60s-80s  . Rectal cancer Neg Hx   . Stomach cancer Neg Hx     Review of Systems  Constitutional: Negative for appetite change, chills, fatigue, fever and unexpected weight change.  HENT:   Negative for hearing loss, lump/mass and trouble swallowing.   Eyes: Negative for eye problems and icterus.  Respiratory: Negative for chest tightness, cough and shortness of breath.   Cardiovascular: Negative for chest pain, leg swelling and palpitations.  Gastrointestinal: Negative for abdominal distention, abdominal pain, constipation, diarrhea, nausea and vomiting.  Endocrine: Positive for hot flashes (occasional).  Genitourinary: Negative for difficulty urinating.   Musculoskeletal: Negative for arthralgias.  Skin: Negative for itching and rash.  Neurological: Negative for dizziness, extremity weakness, headaches and numbness.  Hematological: Negative for adenopathy. Does not bruise/bleed easily.  Psychiatric/Behavioral: Negative for depression. The patient is not nervous/anxious.       PHYSICAL EXAMINATION  ECOG PERFORMANCE STATUS: 1 - Symptomatic but completely ambulatory  Vitals:   03/29/21 1009  BP: 138/82  Pulse: 82  Resp: 18  Temp: (!) 97.4 F (36.3 C)  SpO2: 100%    Physical Exam Constitutional:      General: She is not in acute distress.    Appearance: Normal appearance. She is not toxic-appearing.  HENT:     Head: Normocephalic and atraumatic.  Eyes:     General: No scleral icterus. Cardiovascular:     Rate and Rhythm: Normal rate and regular rhythm.     Pulses: Normal pulses.     Heart sounds: Normal heart sounds.  Pulmonary:     Effort: Pulmonary effort is normal.     Breath sounds: Normal breath sounds.  Abdominal:     General: Abdomen is flat. Bowel sounds are  normal. There is no distension.     Palpations: Abdomen is soft.     Tenderness: There is no abdominal tenderness.  Musculoskeletal:        General: No swelling.     Cervical back: Neck supple.  Lymphadenopathy:     Cervical: No cervical adenopathy.  Skin:    General: Skin is warm and dry.     Findings: No rash.     Comments: Right side of the neck and SCV area has some dry patchy skin in linear pattern, however healing.  No TTP.  Neurological:     General: No focal deficit present.     Mental Status: She is alert.  Psychiatric:        Mood and Affect: Mood normal.        Behavior: Behavior normal.     LABORATORY DATA:  CBC    Component Value Date/Time   WBC 4.2 02/19/2021 1045   WBC 6.7 01/11/2021 0300   RBC 3.59 (L) 02/19/2021 1045   HGB 11.1 (L) 02/19/2021 1045   HGB 13.1 12/15/2017 1515   HCT 33.6 (L) 02/19/2021 1045   HCT 39.2 12/15/2017 1515   PLT 301 02/19/2021 1045   PLT 360 12/15/2017 1515   MCV 93.6 02/19/2021 1045   MCV 93 12/15/2017 1515   MCH 30.9 02/19/2021 1045   MCHC 33.0 02/19/2021 1045   RDW 13.7 02/19/2021 1045   RDW 13.4 12/15/2017 1515   LYMPHSABS 1.1 02/19/2021 1045   MONOABS 0.3 02/19/2021 1045   EOSABS 0.5 02/19/2021 1045   BASOSABS 0.1 02/19/2021 1045    CMP     Component Value Date/Time   NA 139 02/19/2021 1125   NA 139 12/15/2017 1515   K 4.2 02/19/2021 1125   CL 106 02/19/2021 1125   CO2 23 02/19/2021 1125   GLUCOSE 131 (H) 02/19/2021 1125   BUN 9 02/19/2021 1125   BUN 14 12/15/2017 1515   CREATININE 0.72 02/19/2021 1125   CREATININE 0.94 12/17/2016 1248   CALCIUM 9.3 02/19/2021 1125   PROT 6.9 02/19/2021 1125   PROT 6.8 03/16/2018 1353   ALBUMIN 3.6 02/19/2021 1125   ALBUMIN 4.6 03/16/2018 1353   AST 25 02/19/2021 1125   ALT 35 02/19/2021 1125   ALKPHOS 62 02/19/2021 1125   BILITOT 0.5 02/19/2021 1125   GFRNONAA >60 02/19/2021 1125   GFRAA >60 08/14/2020 0958   GFRAA >60 07/26/2020 0801        ASSESSMENT and  THERAPY PLAN:   Malignant neoplasm of lower-outer quadrant of left breast of female, estrogen receptor positive ([place]) 08/16/2020:Left lumpectomy [name]): IDC, grade 2, 2.3cm, with intermediate grade DCIS, clear margins, 1/2 left axillary lymph nodes positive for carcinoma.HER-2 equivocal by IHC (2+), negative by FISH, ER+ 95%, PR+40%, Ki67 15%. T2 N1 M0 stage Ib MammaPrint: High risk (average 10-year risk untreated: 29%, predicted benefit of chemo with hormone therapy at 5 years: 94.6%  Treatment plan: 1.Adjuvant chemotherapy withdose dense Adriamycin and Cytoxan x4 followed by Taxol   2.Followed by adjuvant radiation 3.Followed byadjuvant antiestrogen therapy ------------------------------------------------------------------------------------------------------------------------------------ Current treatment:Completed 4 cycles ofdose Adriamycin and Cytoxan, today cycle12Taxol Echocardiogram 09/08/2020: EF 60 to 65%  Chemo toxicities: 1.Severe nausea and vomiting:Resolved 2.abdominal distention and bloating:Resolved 3.elevated LFTs: Normalized 4.chemo induced anemia: Monitoring today's hemoglobin is 11.9  Hospitalization 01/08/2021-01/11/2021: Laparoscopic cholecystectomy on 01/09/2021, ERCP with sphincterotomy 01/10/2021  Lindsay Hampton's radiation was delayed by her shingles diagnosis however her skin has improved, and only has some mild flakiness to it.  She will proceed with radiation and port removal as scheduled.    I reviewed with her the differences in tamoxifen versus anastrozole and why menopausal status matters.  After discussion, we decided to get labs later this month after one of her radiation appoitnemnts to evaluate her [place], LH and estradiol levels.  I also will get a CBC and CMET to f/u on her counts since she has been off of chemotherapy since March.  We talked about moving forward with life after cancer and fear of recurrence. I encouraged her to be on the  look  out for our [facility] classes as she would likely enjoy these very much.    [place] will see Dr. Lindi Adie week of 05/14/2021.  I will see her for her SCP visit week in late July/early August.  She knows to call for any questions that may arise between now and her next appointment.  We are happy to see her sooner if needed.    Orders Placed This Encounter  Procedures  . CBC with Differential (Cancer Center Only)    Standing Status:   Standing    Number of Occurrences:   1    Standing Expiration Date:   03/29/2022  . CMP ([facility] only)    Standing Status:   Standing    Number of Occurrences:   1    Standing Expiration Date:   03/29/2022  . FSH-Follicle stimulating hormone    Standing Status:   Standing    Number of Occurrences:   1    Standing Expiration Date:   03/29/2022  . LH-Luteinizing hormone    Standing Status:   Standing    Number of Occurrences:   1    Standing Expiration Date:   03/29/2022  . Estradiol, Sensitive    Standing Status:   Standing    Number of Occurrences:   1    Standing Expiration Date:   03/29/2022    Total encounter time: 20 minutes* of face to face visit time reviewing the above, discussing the assessment and plan in detail, ordering labs and in documentation.   Wilber Bihari, NP 03/29/21 11:05 AM Medical Oncology and Hematology [facility] [place], [place] [address] Tel. [phone]    Fax. [phone]  *Total Encounter Time as defined by the Centers for Medicare and Medicaid Services includes, in addition to the face-to-face time of a patient visit (documented in the note above) non-face-to-face time: obtaining and reviewing outside history, ordering and reviewing medications, tests or procedures, care coordination (communications with other health care professionals or caregivers) and documentation in the medical record.

## 2021-04-03 ENCOUNTER — Encounter: Payer: Self-pay | Admitting: *Deleted

## 2021-04-04 DIAGNOSIS — Z510 Encounter for antineoplastic radiation therapy: Secondary | ICD-10-CM | POA: Diagnosis not present

## 2021-04-05 ENCOUNTER — Ambulatory Visit
Admission: RE | Admit: 2021-04-05 | Discharge: 2021-04-05 | Disposition: A | Discharge status: Home / Self Care | Payer: 59 | Source: Ambulatory Visit | Attending: Radiation Oncology | Admitting: Radiation Oncology

## 2021-04-05 ENCOUNTER — Other Ambulatory Visit: Payer: Self-pay

## 2021-04-05 DIAGNOSIS — Z510 Encounter for antineoplastic radiation therapy: Secondary | ICD-10-CM | POA: Diagnosis not present

## 2021-04-05 NOTE — Progress Notes (Signed)
Pt here for patient teaching.  Pt given Radiation and You booklet, skin care instructions, Alra deodorant and Radiaplex gel.  Reviewed areas of pertinence such as fatigue, hair loss, skin changes, breast tenderness and breast swelling . Pt able to give teach back of to pat skin and use unscented/gentle soap,apply Radiaplex bid, avoid applying anything to skin within 4 hours of treatment, avoid wearing an under wire bra and to use an electric razor if they must shave. Pt verbalizes understanding of information given and will contact nursing with any questions or concerns.     Http://rtanswers.org/treatmentinformation/whattoexpect/index  Lukisha Procida M. Vernor Monnig RN, BSN    

## 2021-04-06 ENCOUNTER — Telehealth: Payer: Self-pay | Admitting: *Deleted

## 2021-04-06 ENCOUNTER — Ambulatory Visit
Admission: RE | Admit: 2021-04-06 | Discharge: 2021-04-06 | Disposition: A | Discharge status: Home / Self Care | Payer: 59 | Source: Ambulatory Visit | Attending: Radiation Oncology | Admitting: Radiation Oncology

## 2021-04-06 DIAGNOSIS — Z510 Encounter for antineoplastic radiation therapy: Secondary | ICD-10-CM | POA: Diagnosis not present

## 2021-04-06 DIAGNOSIS — Z170 Estrogen receptor positive status [ER+]: Secondary | ICD-10-CM

## 2021-04-06 DIAGNOSIS — C50512 Malignant neoplasm of lower-outer quadrant of left female breast: Secondary | ICD-10-CM

## 2021-04-06 MED ORDER — RADIAPLEXRX EX GEL
Freq: Once | CUTANEOUS | Status: AC
Start: 2021-04-06 — End: 2021-04-06

## 2021-04-06 MED ORDER — GABAPENTIN 100 MG PO CAPS: 100.0000 mg | ORAL_CAPSULE | Freq: Every day | ORAL | 0 refills | Status: DC

## 2021-04-06 MED ORDER — ALRA NON-METALLIC DEODORANT (RAD-ONC)
1.0000 | Freq: Once | TOPICAL | Status: AC
Start: 2021-04-06 — End: 2021-04-06
  Administered 2021-04-06: 1 via TOPICAL

## 2021-04-06 NOTE — Telephone Encounter (Signed)
Received call from pt with complaint of bilateral intermittent hand and shoulder numbness and tingling x several weeks. Per MD pt to start Gabapentin 100 mg p.o daily at bedtime x1 week.  If symptoms do not improve after one week, pt may increase to gabapentin 300 mg p.o. daily at bedtime. Prescription sent to pharmacy on file. Pt educated and verbalized understanding.

## 2021-04-09 ENCOUNTER — Ambulatory Visit
Admission: RE | Admit: 2021-04-09 | Discharge: 2021-04-09 | Disposition: A | Discharge status: Home / Self Care | Payer: 59 | Source: Ambulatory Visit | Attending: Radiation Oncology | Admitting: Radiation Oncology

## 2021-04-09 DIAGNOSIS — Z510 Encounter for antineoplastic radiation therapy: Secondary | ICD-10-CM | POA: Diagnosis not present

## 2021-04-10 ENCOUNTER — Inpatient Hospital Stay: Payer: 59 | Admitting: Licensed Clinical Social Worker

## 2021-04-10 ENCOUNTER — Ambulatory Visit
Admission: RE | Admit: 2021-04-10 | Discharge: 2021-04-10 | Disposition: A | Discharge status: Home / Self Care | Payer: 59 | Source: Ambulatory Visit | Attending: Radiation Oncology | Admitting: Radiation Oncology

## 2021-04-10 ENCOUNTER — Other Ambulatory Visit: Payer: Self-pay

## 2021-04-10 DIAGNOSIS — C50512 Malignant neoplasm of lower-outer quadrant of left female breast: Secondary | ICD-10-CM

## 2021-04-10 DIAGNOSIS — Z510 Encounter for antineoplastic radiation therapy: Secondary | ICD-10-CM | POA: Diagnosis not present

## 2021-04-10 DIAGNOSIS — Z170 Estrogen receptor positive status [ER+]: Secondary | ICD-10-CM

## 2021-04-10 NOTE — Progress Notes (Signed)
[place] Work  Clinical Social Work was referred by patient for support around anxiety and fear after chemo.  Clinical Social Worker met with patient  to offer support and assess for needs.    Patient has a history of anxiety although she was progressing through treatment with only small increase until chemotherapy finished. The combination of time to process and no longer having checks and blood work completed regularly has led to increase in fear of recurrence and/or metastases. A compounding factor is that her husband's (married spring 2021) first wife died from stage IV breast cancer.  Patient had space to verbalize and begin processing fears today. CSW normalized some of the worries that pt is experiencing and provided empathic listening.  Pt was able to identify many strategies that she currently uses to work on managing fears (walking, breathing, notice "horribilizing" thoughts) and will work on reducing her google searches as those increase anxiety. CSW worked on beginning to challenge some unhelpful thoughts with patient and practiced a grounding skill using the five senses.   Next steps: - Referral made for an [facility] guide. Pt is also considering support group - Patient will continue positive coping strategies, including new grounding skill, and particularly incorporate them before bed when anxiety usually increases - Follow-up in 2 weeks (CSW will also work on identifying ongoing therapists with trauma focus)    Avin Gibbons E Koreen Lizaola, [place]

## 2021-04-11 ENCOUNTER — Ambulatory Visit
Admission: RE | Admit: 2021-04-11 | Discharge: 2021-04-11 | Disposition: A | Discharge status: Home / Self Care | Payer: 59 | Source: Ambulatory Visit | Attending: Radiation Oncology | Admitting: Radiation Oncology

## 2021-04-11 DIAGNOSIS — Z510 Encounter for antineoplastic radiation therapy: Secondary | ICD-10-CM | POA: Diagnosis not present

## 2021-04-12 ENCOUNTER — Ambulatory Visit
Admission: RE | Admit: 2021-04-12 | Discharge: 2021-04-12 | Disposition: A | Discharge status: Home / Self Care | Payer: 59 | Source: Ambulatory Visit | Attending: Radiation Oncology | Admitting: Radiation Oncology

## 2021-04-12 DIAGNOSIS — Z510 Encounter for antineoplastic radiation therapy: Secondary | ICD-10-CM | POA: Diagnosis not present

## 2021-04-13 ENCOUNTER — Ambulatory Visit
Admission: RE | Admit: 2021-04-13 | Discharge: 2021-04-13 | Disposition: A | Discharge status: Home / Self Care | Payer: 59 | Source: Ambulatory Visit | Attending: Radiation Oncology | Admitting: Radiation Oncology

## 2021-04-13 ENCOUNTER — Other Ambulatory Visit: Payer: Self-pay

## 2021-04-13 DIAGNOSIS — Z510 Encounter for antineoplastic radiation therapy: Secondary | ICD-10-CM | POA: Diagnosis not present

## 2021-04-16 ENCOUNTER — Ambulatory Visit
Admission: RE | Admit: 2021-04-16 | Discharge: 2021-04-16 | Disposition: A | Discharge status: Home / Self Care | Payer: 59 | Source: Ambulatory Visit | Attending: Radiation Oncology | Admitting: Radiation Oncology

## 2021-04-16 ENCOUNTER — Other Ambulatory Visit: Payer: Self-pay

## 2021-04-16 DIAGNOSIS — Z510 Encounter for antineoplastic radiation therapy: Secondary | ICD-10-CM | POA: Diagnosis not present

## 2021-04-17 ENCOUNTER — Ambulatory Visit
Admission: RE | Admit: 2021-04-17 | Discharge: 2021-04-17 | Disposition: A | Discharge status: Home / Self Care | Payer: 59 | Source: Ambulatory Visit | Attending: Radiation Oncology | Admitting: Radiation Oncology

## 2021-04-17 DIAGNOSIS — Z510 Encounter for antineoplastic radiation therapy: Secondary | ICD-10-CM | POA: Diagnosis not present

## 2021-04-18 ENCOUNTER — Other Ambulatory Visit: Payer: Self-pay

## 2021-04-18 ENCOUNTER — Ambulatory Visit
Admission: RE | Admit: 2021-04-18 | Discharge: 2021-04-18 | Disposition: A | Discharge status: Home / Self Care | Payer: 59 | Source: Ambulatory Visit | Attending: Radiation Oncology | Admitting: Radiation Oncology

## 2021-04-18 DIAGNOSIS — Z510 Encounter for antineoplastic radiation therapy: Secondary | ICD-10-CM | POA: Diagnosis not present

## 2021-04-19 ENCOUNTER — Ambulatory Visit
Admission: RE | Admit: 2021-04-19 | Discharge: 2021-04-19 | Disposition: A | Discharge status: Home / Self Care | Payer: 59 | Source: Ambulatory Visit | Attending: Radiation Oncology | Admitting: Radiation Oncology

## 2021-04-19 DIAGNOSIS — Z510 Encounter for antineoplastic radiation therapy: Secondary | ICD-10-CM | POA: Diagnosis not present

## 2021-04-20 ENCOUNTER — Other Ambulatory Visit: Payer: Self-pay

## 2021-04-20 ENCOUNTER — Ambulatory Visit
Admission: RE | Admit: 2021-04-20 | Discharge: 2021-04-20 | Disposition: A | Discharge status: Home / Self Care | Payer: 59 | Source: Ambulatory Visit | Attending: Radiation Oncology | Admitting: Radiation Oncology

## 2021-04-20 DIAGNOSIS — Z510 Encounter for antineoplastic radiation therapy: Secondary | ICD-10-CM | POA: Diagnosis not present

## 2021-04-22 DIAGNOSIS — Z510 Encounter for antineoplastic radiation therapy: Secondary | ICD-10-CM | POA: Diagnosis not present

## 2021-04-24 ENCOUNTER — Ambulatory Visit
Admission: RE | Admit: 2021-04-24 | Discharge: 2021-04-24 | Disposition: A | Discharge status: Home / Self Care | Payer: 59 | Source: Ambulatory Visit | Attending: Radiation Oncology | Admitting: Radiation Oncology

## 2021-04-24 DIAGNOSIS — Z510 Encounter for antineoplastic radiation therapy: Secondary | ICD-10-CM | POA: Diagnosis not present

## 2021-04-25 ENCOUNTER — Other Ambulatory Visit: Payer: Self-pay

## 2021-04-25 ENCOUNTER — Ambulatory Visit
Admission: RE | Admit: 2021-04-25 | Discharge: 2021-04-25 | Disposition: A | Discharge status: Home / Self Care | Payer: 59 | Source: Ambulatory Visit | Attending: Radiation Oncology | Admitting: Radiation Oncology

## 2021-04-25 DIAGNOSIS — Z170 Estrogen receptor positive status [ER+]: Secondary | ICD-10-CM | POA: Diagnosis present

## 2021-04-25 DIAGNOSIS — C50512 Malignant neoplasm of lower-outer quadrant of left female breast: Secondary | ICD-10-CM | POA: Insufficient documentation

## 2021-04-26 ENCOUNTER — Inpatient Hospital Stay: Payer: 59 | Attending: Hematology and Oncology

## 2021-04-26 ENCOUNTER — Ambulatory Visit
Admission: RE | Admit: 2021-04-26 | Discharge: 2021-04-26 | Disposition: A | Discharge status: Home / Self Care | Payer: 59 | Source: Ambulatory Visit | Attending: Radiation Oncology | Admitting: Radiation Oncology

## 2021-04-26 ENCOUNTER — Inpatient Hospital Stay: Payer: 59 | Admitting: Licensed Clinical Social Worker

## 2021-04-26 DIAGNOSIS — Z170 Estrogen receptor positive status [ER+]: Secondary | ICD-10-CM | POA: Diagnosis not present

## 2021-04-26 DIAGNOSIS — C50512 Malignant neoplasm of lower-outer quadrant of left female breast: Secondary | ICD-10-CM | POA: Diagnosis not present

## 2021-04-26 DIAGNOSIS — Z79899 Other long term (current) drug therapy: Secondary | ICD-10-CM | POA: Insufficient documentation

## 2021-04-26 DIAGNOSIS — Z9221 Personal history of antineoplastic chemotherapy: Secondary | ICD-10-CM | POA: Insufficient documentation

## 2021-04-26 DIAGNOSIS — G629 Polyneuropathy, unspecified: Secondary | ICD-10-CM | POA: Insufficient documentation

## 2021-04-26 DIAGNOSIS — C773 Secondary and unspecified malignant neoplasm of axilla and upper limb lymph nodes: Secondary | ICD-10-CM | POA: Insufficient documentation

## 2021-04-26 LAB — CMP (CANCER CENTER ONLY)
ALT: 22 U/L (ref 0–44)
AST: 22 U/L (ref 15–41)
Albumin: 4 g/dL (ref 3.5–5.0)
Alkaline Phosphatase: 75 U/L (ref 38–126)
Anion gap: 10 (ref 5–15)
BUN: 14 mg/dL (ref 6–20)
CO2: 24 mmol/L (ref 22–32)
Calcium: 10.1 mg/dL (ref 8.9–10.3)
Chloride: 106 mmol/L (ref 98–111)
Creatinine: 0.72 mg/dL (ref 0.44–1.00)
GFR, Estimated: >60 mL/min (ref >60)
Glucose, Bld: 91 mg/dL (ref 70–99)
Potassium: 4.2 mmol/L (ref 3.5–5.1)
Sodium: 140 mmol/L (ref 135–145)
Total Bilirubin: 0.6 mg/dL (ref 0.3–1.2)
Total Protein: 7.4 g/dL (ref 6.5–8.1)

## 2021-04-26 LAB — CBC WITH DIFFERENTIAL (CANCER CENTER ONLY)
Abs Immature Granulocytes: 0.02 10*3/uL (ref 0.00–0.07)
Basophils Absolute: 0 10*3/uL (ref 0.0–0.1)
Basophils Relative: 1 %
Eosinophils Absolute: 0.2 10*3/uL (ref 0.0–0.5)
Eosinophils Relative: 3 %
HCT: 39.2 % (ref 36.0–46.0)
Hemoglobin: 12.8 g/dL (ref 12.0–15.0)
Immature Granulocytes: 0 %
Lymphocytes Relative: 15 %
Lymphs Abs: 0.9 10*3/uL (ref 0.7–4.0)
MCH: 29 pg (ref 26.0–34.0)
MCHC: 32.7 g/dL (ref 30.0–36.0)
MCV: 88.9 fL (ref 80.0–100.0)
Monocytes Absolute: 0.6 10*3/uL (ref 0.1–1.0)
Monocytes Relative: 10 %
Neutro Abs: 4.4 10*3/uL (ref 1.7–7.7)
Neutrophils Relative %: 71 %
Platelet Count: 279 10*3/uL (ref 150–400)
RBC: 4.41 MIL/uL (ref 3.87–5.11)
RDW: 14.6 % (ref 11.5–15.5)
WBC Count: 6.2 10*3/uL (ref 4.0–10.5)
nRBC: 0 % (ref 0.0–0.2)

## 2021-04-26 NOTE — Progress Notes (Signed)
[facility] CSW Progress Note  Holiday representative met with patient to provide ongoing supportive counseling. Pt reports improvement in day-to-day anxiety with using mindfulness as well as her regular walking and noticing what thoughts are facts versus fears.  She did note two times of increased anxiety (x-ray during radiation and bloodwork today). CSW normalized increased anxiety during scans and tests and discussed ways to identify trigger times and plan for increased support (self-soothing and outside support) during these times.   Patient is interested in ongoing therapy with trauma focus- CSW provided names of potential therapists who pt will contact. CSW added pt to interest list for next [facility] class.  Follow-up: none at this time as pt explores ongoing services. Pt may call as needed for additional support   Christeen Douglas LCSW

## 2021-04-27 ENCOUNTER — Other Ambulatory Visit: Payer: Self-pay

## 2021-04-27 ENCOUNTER — Ambulatory Visit
Admission: RE | Admit: 2021-04-27 | Discharge: 2021-04-27 | Disposition: A | Discharge status: Home / Self Care | Payer: 59 | Source: Ambulatory Visit | Attending: Radiation Oncology | Admitting: Radiation Oncology

## 2021-04-27 DIAGNOSIS — C50512 Malignant neoplasm of lower-outer quadrant of left female breast: Secondary | ICD-10-CM | POA: Diagnosis not present

## 2021-04-27 LAB — LUTEINIZING HORMONE: LH: 32.5 m[IU]/mL

## 2021-04-27 LAB — FOLLICLE STIMULATING HORMONE: FSH: 58.4 m[IU]/mL

## 2021-04-30 ENCOUNTER — Ambulatory Visit
Admission: RE | Admit: 2021-04-30 | Discharge: 2021-04-30 | Disposition: A | Discharge status: Home / Self Care | Payer: 59 | Source: Ambulatory Visit | Attending: Radiation Oncology | Admitting: Radiation Oncology

## 2021-04-30 ENCOUNTER — Other Ambulatory Visit: Payer: Self-pay

## 2021-04-30 DIAGNOSIS — C50512 Malignant neoplasm of lower-outer quadrant of left female breast: Secondary | ICD-10-CM | POA: Diagnosis not present

## 2021-05-01 ENCOUNTER — Ambulatory Visit
Admission: RE | Admit: 2021-05-01 | Discharge: 2021-05-01 | Disposition: A | Discharge status: Home / Self Care | Payer: 59 | Source: Ambulatory Visit | Attending: Radiation Oncology | Admitting: Radiation Oncology

## 2021-05-01 DIAGNOSIS — C50512 Malignant neoplasm of lower-outer quadrant of left female breast: Secondary | ICD-10-CM | POA: Diagnosis not present

## 2021-05-02 ENCOUNTER — Ambulatory Visit
Admission: RE | Admit: 2021-05-02 | Discharge: 2021-05-02 | Disposition: A | Discharge status: Home / Self Care | Payer: 59 | Source: Ambulatory Visit | Attending: Radiation Oncology | Admitting: Radiation Oncology

## 2021-05-02 ENCOUNTER — Other Ambulatory Visit: Payer: Self-pay

## 2021-05-02 ENCOUNTER — Other Ambulatory Visit: Payer: Self-pay | Admitting: Hematology and Oncology

## 2021-05-02 DIAGNOSIS — C50512 Malignant neoplasm of lower-outer quadrant of left female breast: Secondary | ICD-10-CM | POA: Diagnosis not present

## 2021-05-03 ENCOUNTER — Ambulatory Visit
Admission: RE | Admit: 2021-05-03 | Discharge: 2021-05-03 | Disposition: A | Discharge status: Home / Self Care | Payer: 59 | Source: Ambulatory Visit | Attending: Radiation Oncology | Admitting: Radiation Oncology

## 2021-05-03 DIAGNOSIS — C50512 Malignant neoplasm of lower-outer quadrant of left female breast: Secondary | ICD-10-CM | POA: Diagnosis not present

## 2021-05-04 ENCOUNTER — Ambulatory Visit
Admission: RE | Admit: 2021-05-04 | Discharge: 2021-05-04 | Disposition: A | Discharge status: Home / Self Care | Payer: 59 | Source: Ambulatory Visit | Attending: Radiation Oncology | Admitting: Radiation Oncology

## 2021-05-04 DIAGNOSIS — Z170 Estrogen receptor positive status [ER+]: Secondary | ICD-10-CM

## 2021-05-04 DIAGNOSIS — C50512 Malignant neoplasm of lower-outer quadrant of left female breast: Secondary | ICD-10-CM | POA: Diagnosis not present

## 2021-05-04 MED ORDER — RADIAPLEXRX EX GEL: Freq: Once | CUTANEOUS | Status: AC

## 2021-05-07 ENCOUNTER — Ambulatory Visit
Admission: RE | Admit: 2021-05-07 | Discharge: 2021-05-07 | Disposition: A | Discharge status: Home / Self Care | Payer: 59 | Source: Ambulatory Visit | Attending: Radiation Oncology | Admitting: Radiation Oncology

## 2021-05-07 ENCOUNTER — Other Ambulatory Visit: Payer: Self-pay

## 2021-05-07 DIAGNOSIS — C50512 Malignant neoplasm of lower-outer quadrant of left female breast: Secondary | ICD-10-CM | POA: Diagnosis not present

## 2021-05-08 ENCOUNTER — Ambulatory Visit
Admission: RE | Admit: 2021-05-08 | Discharge: 2021-05-08 | Disposition: A | Discharge status: Home / Self Care | Payer: 59 | Source: Ambulatory Visit | Attending: Radiation Oncology | Admitting: Radiation Oncology

## 2021-05-08 ENCOUNTER — Encounter: Payer: Self-pay | Admitting: Radiation Oncology

## 2021-05-08 DIAGNOSIS — C50512 Malignant neoplasm of lower-outer quadrant of left female breast: Secondary | ICD-10-CM | POA: Diagnosis not present

## 2021-05-09 ENCOUNTER — Ambulatory Visit: Payer: 59 | Admitting: Obstetrics and Gynecology

## 2021-05-09 ENCOUNTER — Ambulatory Visit
Admission: RE | Admit: 2021-05-09 | Discharge: 2021-05-09 | Disposition: A | Discharge status: Home / Self Care | Payer: 59 | Source: Ambulatory Visit | Attending: Radiation Oncology | Admitting: Radiation Oncology

## 2021-05-09 DIAGNOSIS — C50512 Malignant neoplasm of lower-outer quadrant of left female breast: Secondary | ICD-10-CM | POA: Diagnosis not present

## 2021-05-10 ENCOUNTER — Encounter: Payer: Self-pay | Admitting: Hematology and Oncology

## 2021-05-10 ENCOUNTER — Other Ambulatory Visit: Payer: Self-pay

## 2021-05-10 ENCOUNTER — Ambulatory Visit
Admission: RE | Admit: 2021-05-10 | Discharge: 2021-05-10 | Disposition: A | Discharge status: Home / Self Care | Payer: 59 | Source: Ambulatory Visit | Attending: Radiation Oncology | Admitting: Radiation Oncology

## 2021-05-10 DIAGNOSIS — C50512 Malignant neoplasm of lower-outer quadrant of left female breast: Secondary | ICD-10-CM | POA: Diagnosis not present

## 2021-05-10 NOTE — Progress Notes (Signed)
  Radiation Oncology         [phone]) [phone] ________________________________  Name: Lindsay Hampton MRN: [id number]  Date: 05/08/2021  DOB: [birth year]  SIMULATION NOTE   NARRATIVE:  The patient underwent simulation today for ongoing radiation therapy.  The existing CT study set was employed for the purpose of virtual treatment planning.  The target and avoidance structures were reviewed and modified as necessary.  Treatment planning then occurred.  The radiation boost prescription was entered and confirmed.  A total of 3 complex treatment devices were fabricated in the form of multi-leaf collimators to shape radiation around the targets while maximally excluding nearby normal structures. I have requested : Isodose Plan.    PLAN:  This modified radiation beam arrangement is intended to continue the current radiation dose to an additional 10 Gy in 5 fractions for a total cumulative dose of 60.4 Gy.    ------------------------------------------------  Jodelle Gross, MD, PhD

## 2021-05-11 ENCOUNTER — Ambulatory Visit: Payer: 59 | Admitting: Radiation Oncology

## 2021-05-11 ENCOUNTER — Ambulatory Visit
Admission: RE | Admit: 2021-05-11 | Discharge: 2021-05-11 | Disposition: A | Discharge status: Home / Self Care | Payer: 59 | Source: Ambulatory Visit | Attending: Radiation Oncology | Admitting: Radiation Oncology

## 2021-05-11 DIAGNOSIS — C50512 Malignant neoplasm of lower-outer quadrant of left female breast: Secondary | ICD-10-CM | POA: Diagnosis not present

## 2021-05-12 NOTE — Progress Notes (Signed)
Patient Care Team: [name], [place] as PCP - General (Internal Medicine) [name], RN as Oncology Nurse Navigator [name], RN as Oncology Nurse Navigator [name], MD as Consulting Physician (General Surgery) [name], MD as Consulting Physician (Hematology and Oncology) Kyung Rudd, MD as Consulting Physician (Radiation Oncology)  DIAGNOSIS:    ICD-10-CM   1. Malignant neoplasm of lower-outer quadrant of left breast of female, estrogen receptor positive ([place])  C50.512    Z17.0       SUMMARY OF ONCOLOGIC HISTORY: Oncology History  Malignant neoplasm of lower-outer quadrant of left breast of female, estrogen receptor positive ([place])  07/20/2020 Initial Diagnosis   Screening mammogram detected left breast calcifications, enlarged left axillary lymph nodes, and an asymmetry in the right breast. Diagnostic mammogram showed in the left breast, a 2.4cm mass with calcifications, 3:30 position, a left axillary lymph node with cortical thickness, and in the right breast, no suspicious masses or calcifications. Left breast biopsy showed invasive and in situ mammary carcinoma in the breast and axilla, grade 2, HER-2 equivocal by IHC (2+), negative by FISH, ER+ 95%, PR+40%, Ki67 15%.    07/26/2020 Cancer Staging   Staging form: Breast, AJCC 8th Edition - Clinical stage from 07/26/2020: Stage IIA (cT2, cN1(f), cM0, G2, ER+, PR+, HER2-) - Signed by [name], MD on 07/26/2020    08/01/2020 Genetic Testing   No pathogenic variants detected in [facility] Multi-Cancer Panel.  The Multi-Cancer Panel offered by [facility] includes sequencing and/or deletion duplication testing of the following 85 genes: AIP, ALK, APC, ATM, AXIN2,BAP1,  BARD1, BLM, BMPR1A, BRCA1, BRCA2, BRIP1, CASR, CDC73, CDH1, CDK4, CDKN1B, CDKN1C, CDKN2A (p14ARF), CDKN2A (p16INK4a), CEBPA, CHEK2, CTNNA1, DICER1, DIS3L2, EGFR (c.2369C>T, p.Thr790Met variant only), EPCAM (Deletion/duplication testing only), FH,  FLCN, GATA2, GPC3, GREM1 (Promoter region deletion/duplication testing only), HOXB13 (c.251G>A, p.Gly84Glu), HRAS, KIT, MAX, MEN1, MET, MITF (c.952G>A, p.Glu318Lys variant only), MLH1, MSH2, MSH3, MSH6, MUTYH, NBN, NF1, NF2, NTHL1, PALB2, PDGFRA, PHOX2B, PMS2, POLD1, POLE, POT1, PRKAR1A, PTCH1, PTEN, RAD50, RAD51C, RAD51D, RB1, RECQL4, RET, RNF43, RUNX1, SDHAF2, SDHA (sequence changes only), SDHB, SDHC, SDHD, SMAD4, SMARCA4, SMARCB1, SMARCE1, STK11, SUFU, TERC, TERT, TMEM127, TP53, TSC1, TSC2, VHL, WRN and WT1.  The report date is August 01, 2020.    08/16/2020 Surgery   Left lumpectomy [name]): IDC, grade 2, 2.3cm, with intermediate grade DCIS, clear margins, 1/2 left axillary lymph nodes positive for carcinoma.HER-2 equivocal by IHC (2+), negative by FISH, ER+ 95%, PR+40%, Ki67 15%.    08/24/2020 Cancer Staging   Staging form: Breast, AJCC 8th Edition - Pathologic stage from 08/24/2020: Stage IB (pT2, pN1a, cM0, G2, ER+, PR+, HER2-) - Signed by [name], MD on 08/24/2020    08/30/2020 Miscellaneous   MammaPrint high risk   09/13/2020 - 02/19/2021 Chemotherapy            CHIEF COMPLIANT: Follow-up for left breast cancer  INTERVAL HISTORY: Lindsay Hampton is a 55 y.o. with above-mentioned history of left breast cancer treated with lumpectomy and who has completed adjuvant chemotherapy with weekly Taxol after completing 4 cycles of Adriamycin and Cytoxan. She reports to the clinic today for follow-up.   ALLERGIES:  has No Known Allergies.  MEDICATIONS:  Current Outpatient Medications  Medication Sig Dispense Refill   acetaminophen (TYLENOL) 325 MG tablet You can take 2 tablets every 4 hours as needed for pain.  Do not take over 4000 mg of Tylenol/acetaminophen per day.  Tylenol#3 is Tylenol and codeine.  You need to  count each of these tablets as part of your daily maximum of Tylenol.     ALPRAZolam (XANAX) 0.25 MG tablet TAKE 1 TABLET BY MOUTH 2 TIMES DAILY AS NEEDED FOR ANXIETY. 60  tablet 3   dexamethasone (DECADRON) 4 MG tablet Take 1 tablet (4 mg total) by mouth as needed (daily as needed for nausea). (Patient not taking: Reported on 03/29/2021) 30 tablet 0   gabapentin (NEURONTIN) 100 MG capsule TAKE 1 CAPSULE BY MOUTH AT BEDTIME. 30 capsule 0   ondansetron (ZOFRAN-ODT) 8 MG disintegrating tablet TAKE 1 TABLET BY MOUTH EVERY 8 HOURS AS NEEDED FOR NAUSEA OR VOMITING. (Patient not taking: Reported on 03/29/2021) 20 tablet 1   sodium chloride (OCEAN) 0.65 % SOLN nasal spray Place 1 spray into both nostrils as needed for congestion.     valACYclovir (VALTREX) 1000 MG tablet Take 1 tablet (1,000 mg total) by mouth 3 (three) times daily. 21 tablet 0   No current facility-administered medications for this visit.    PHYSICAL EXAMINATION: ECOG PERFORMANCE STATUS: 1 - Symptomatic but completely ambulatory  Vitals:   05/14/21 1100  BP: 139/87  Pulse: (!) 102  Resp: 19  Temp: 97.7 F (36.5 C)  SpO2: 98%   Filed Weights   05/14/21 1100  Weight: 223 lb 14.4 oz (101.6 kg)    BREAST: No palpable masses or nodules in either right or left breasts. No palpable axillary supraclavicular or infraclavicular adenopathy no breast tenderness or nipple discharge. (exam performed in the presence of a chaperone)  LABORATORY DATA:  I have reviewed the data as listed CMP Latest Ref Rng & Units 04/26/2021 02/19/2021 02/12/2021  Glucose 70 - 99 mg/dL 91 131(H) 113(H)  BUN 6 - 20 mg/dL 14 9 12   Creatinine 0.44 - 1.00 mg/dL 0.72 0.72 0.78  Sodium 135 - 145 mmol/L 140 139 135  Potassium 3.5 - 5.1 mmol/L 4.2 4.2 4.0  Chloride 98 - 111 mmol/L 106 106 104  CO2 22 - 32 mmol/L 24 23 24   Calcium 8.9 - 10.3 mg/dL 10.1 9.3 10.4(H)  Total Protein 6.5 - 8.1 g/dL 7.4 6.9 7.3  Total Bilirubin 0.3 - 1.2 mg/dL 0.6 0.5 0.6  Alkaline Phos 38 - 126 U/L 75 62 66  AST 15 - 41 U/L 22 25 34  ALT 0 - 44 U/L 22 35 44    Lab Results  Component Value Date   WBC 6.2 04/26/2021   HGB 12.8 04/26/2021   HCT 39.2  04/26/2021   MCV 88.9 04/26/2021   PLT 279 04/26/2021   NEUTROABS 4.4 04/26/2021    ASSESSMENT & PLAN:  Malignant neoplasm of lower-outer quadrant of left breast of female, estrogen receptor positive ([place]) 08/16/2020:Left lumpectomy [name]): IDC, grade 2, 2.3cm, with intermediate grade DCIS, clear margins, 1/2 left axillary lymph nodes positive for carcinoma.HER-2 equivocal by IHC (2+), negative by FISH, ER+ 95%, PR+40%, Ki67 15%.  T2 N1 M0 stage Ib MammaPrint: High risk (average 10-year risk untreated: 29%, predicted benefit of chemo with hormone therapy at 5 years: 94.6%   Treatment plan:  1. Adjuvant chemotherapy with dose dense Adriamycin and Cytoxan x4 followed by Taxol  completed 02/19/21 2. Followed by adjuvant radiation 04/06/21- 05/22/21 3. Followed by adjuvant antiestrogen therapy will start 05/29/2021 ------------------------------------------------------------------------------------------------------------------------------------ Current treatment: Anastrozole counseling: We discussed the risks and benefits of anti-estrogen therapy with aromatase inhibitors. These include but not limited to insomnia, hot flashes, mood changes, vaginal dryness, bone density loss, and weight gain. We strongly believe that the benefits far  outweigh the risks. Patient understands these risks and consented to starting treatment. Planned treatment duration is 7 years. She is anxious about side effects like hot flashes and joint stiffness and vaginal dryness.  Mild peripheral neuropathy: I discussed and increased the dose of gabapentin to 300 mg at bedtime. If she continues to have neuropathy in 3 months she may be eligible for neuropathy clinical trial.  RTC on 06/22/2021 for survivorship care plan visit. After that I can see her in 6 months and after that annually.   No orders of the defined types were placed in this encounter.  The patient has a good understanding of the overall plan. she agrees with  it. she will call with any problems that may develop before the next visit here.  Total time spent: 30 mins including face to face time and time spent for planning, charting and coordination of care  Rulon Eisenmenger, MD, MPH 05/14/2021  I, Thana Ates, am acting as scribe for Dr. [name].  I have reviewed the above documentation for accuracy and completeness, and I agree with the above.

## 2021-05-13 NOTE — Assessment & Plan Note (Signed)
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 T2 N1 M0 stage Ib MammaPrint: High risk (average 10-year risk untreated: 29%, predicted benefit of chemo with hormone therapy at 5 years: 94.6%  Treatment plan: 1.Adjuvant chemotherapy withdose dense Adriamycin and Cytoxan x4 followed by Taxol completed 02/19/21 2.Followed by adjuvant radiation 04/06/21- 05/22/21 3.Followed byadjuvant antiestrogen therapy ------------------------------------------------------------------------------------------------------------------------------------ Current treatment: Anastrozole counseling: We discussed the risks and benefits of anti-estrogen therapy with aromatase inhibitors. These include but not limited to insomnia, hot flashes, mood changes, vaginal dryness, bone density loss, and weight gain. We strongly believe that the benefits far outweigh the risks. Patient understands these risks and consented to starting treatment. Planned treatment duration is 5-7 years.  RTC in 3 months for SCP visit

## 2021-05-14 ENCOUNTER — Other Ambulatory Visit: Payer: Self-pay

## 2021-05-14 ENCOUNTER — Ambulatory Visit
Admission: RE | Admit: 2021-05-14 | Discharge: 2021-05-14 | Disposition: A | Discharge status: Home / Self Care | Payer: 59 | Source: Ambulatory Visit | Attending: Radiation Oncology | Admitting: Radiation Oncology

## 2021-05-14 ENCOUNTER — Inpatient Hospital Stay (HOSPITAL_BASED_OUTPATIENT_CLINIC_OR_DEPARTMENT_OTHER): Payer: 59 | Admitting: Hematology and Oncology

## 2021-05-14 DIAGNOSIS — Z170 Estrogen receptor positive status [ER+]: Secondary | ICD-10-CM | POA: Diagnosis not present

## 2021-05-14 DIAGNOSIS — C50512 Malignant neoplasm of lower-outer quadrant of left female breast: Secondary | ICD-10-CM

## 2021-05-14 MED ORDER — ANASTROZOLE 1 MG PO TABS
1.0000 mg | ORAL_TABLET | Freq: Every day | ORAL | 3 refills | Status: DC
Start: 2021-05-14 — End: 2021-12-26

## 2021-05-14 MED ORDER — GABAPENTIN 300 MG PO CAPS
300.0000 mg | ORAL_CAPSULE | Freq: Every day | ORAL | 3 refills | Status: DC
Start: 2021-05-14 — End: 2021-12-26

## 2021-05-15 ENCOUNTER — Ambulatory Visit
Admission: RE | Admit: 2021-05-15 | Discharge: 2021-05-15 | Disposition: A | Discharge status: Home / Self Care | Payer: 59 | Source: Ambulatory Visit | Attending: Radiation Oncology | Admitting: Radiation Oncology

## 2021-05-15 DIAGNOSIS — C50512 Malignant neoplasm of lower-outer quadrant of left female breast: Secondary | ICD-10-CM | POA: Diagnosis not present

## 2021-05-16 ENCOUNTER — Other Ambulatory Visit: Payer: Self-pay

## 2021-05-16 ENCOUNTER — Ambulatory Visit
Admission: RE | Admit: 2021-05-16 | Discharge: 2021-05-16 | Disposition: A | Discharge status: Home / Self Care | Payer: 59 | Source: Ambulatory Visit | Attending: Radiation Oncology | Admitting: Radiation Oncology

## 2021-05-16 DIAGNOSIS — C50512 Malignant neoplasm of lower-outer quadrant of left female breast: Secondary | ICD-10-CM | POA: Diagnosis not present

## 2021-05-17 ENCOUNTER — Telehealth: Payer: Self-pay

## 2021-05-17 ENCOUNTER — Ambulatory Visit
Admission: RE | Admit: 2021-05-17 | Discharge: 2021-05-17 | Disposition: A | Discharge status: Home / Self Care | Payer: 59 | Source: Ambulatory Visit | Attending: Radiation Oncology | Admitting: Radiation Oncology

## 2021-05-17 ENCOUNTER — Other Ambulatory Visit: Payer: Self-pay

## 2021-05-17 ENCOUNTER — Inpatient Hospital Stay: Payer: 59

## 2021-05-17 DIAGNOSIS — C50512 Malignant neoplasm of lower-outer quadrant of left female breast: Secondary | ICD-10-CM | POA: Diagnosis not present

## 2021-05-17 DIAGNOSIS — Z170 Estrogen receptor positive status [ER+]: Secondary | ICD-10-CM

## 2021-05-17 NOTE — Telephone Encounter (Signed)
This nurse spoke with patient regarding redraw for Estradiol lab. Patient agreed to come in at 1015 this morning for redraw, prior to radiation treatment.  No further questions or concerns at this time.

## 2021-05-18 ENCOUNTER — Ambulatory Visit
Admission: RE | Admit: 2021-05-18 | Discharge: 2021-05-18 | Disposition: A | Discharge status: Home / Self Care | Payer: 59 | Source: Ambulatory Visit | Attending: Radiation Oncology | Admitting: Radiation Oncology

## 2021-05-18 ENCOUNTER — Encounter: Payer: Self-pay | Admitting: *Deleted

## 2021-05-18 ENCOUNTER — Other Ambulatory Visit: Payer: Self-pay

## 2021-05-18 DIAGNOSIS — C50512 Malignant neoplasm of lower-outer quadrant of left female breast: Secondary | ICD-10-CM | POA: Diagnosis not present

## 2021-05-21 ENCOUNTER — Other Ambulatory Visit: Payer: Self-pay

## 2021-05-21 ENCOUNTER — Ambulatory Visit
Admission: RE | Admit: 2021-05-21 | Discharge: 2021-05-21 | Disposition: A | Discharge status: Home / Self Care | Payer: 59 | Source: Ambulatory Visit | Attending: Radiation Oncology | Admitting: Radiation Oncology

## 2021-05-21 DIAGNOSIS — C50512 Malignant neoplasm of lower-outer quadrant of left female breast: Secondary | ICD-10-CM | POA: Diagnosis not present

## 2021-05-21 LAB — ESTRADIOL, ULTRA SENS: Estradiol, Sensitive: <2.5 pg/mL

## 2021-05-22 ENCOUNTER — Encounter: Payer: Self-pay | Admitting: Radiation Oncology

## 2021-05-22 ENCOUNTER — Ambulatory Visit
Admission: RE | Admit: 2021-05-22 | Discharge: 2021-05-22 | Disposition: A | Discharge status: Home / Self Care | Payer: 59 | Source: Ambulatory Visit | Attending: Radiation Oncology | Admitting: Radiation Oncology

## 2021-05-22 DIAGNOSIS — C50512 Malignant neoplasm of lower-outer quadrant of left female breast: Secondary | ICD-10-CM | POA: Diagnosis not present

## 2021-05-23 NOTE — Telephone Encounter (Signed)
Error

## 2021-05-24 ENCOUNTER — Encounter: Payer: Self-pay | Admitting: Hematology and Oncology

## 2021-05-24 NOTE — Progress Notes (Signed)
                                                                                                                                                             Patient Name: Lindsay Hampton MRN: [id number] DOB: 1966/10/28 Referring Physician: [name] (Profile Not Attached) Date of Service: 05/22/2021 [facility] [place]                                                        End Of Treatment Note  Diagnoses: C50.512-Malignant neoplasm of lower-outer quadrant of left female breast  Cancer Staging:  Stage IIA, cT2N1M0 grade 2, ER/PR positive invasive ductal carcinoma of the left breast.  Intent: Curative  Radiation Treatment Dates: 04/05/2021 through 05/22/2021 Site Technique Total Dose (Gy) Dose per Fx (Gy) Completed Fx Beam Energies  Breast, Left: Breast_Lt 3D 50.4/50.4 1.8 28/28 10X  Breast, Left: Breast_Lt_SCLV 3D 50.4/50.4 1.8 28/28 6X, 10X  Breast, Left: Breast_Lt_Bst 3D 10/10 2 5/5 6X, 10X   Narrative: The patient tolerated radiotherapy well. She did have erythema and anticipated skin changes in the treatment field as well as fatigue.  Plan: The patient will receive a call in about one month from the radiation oncology department. She will continue follow up with Dr. Lindi Adie as well.   ________________________________________________    Carola Rhine, [facility]

## 2021-06-04 ENCOUNTER — Other Ambulatory Visit: Payer: Self-pay

## 2021-06-04 ENCOUNTER — Ambulatory Visit: Payer: 59 | Attending: Surgery

## 2021-06-04 DIAGNOSIS — Z483 Aftercare following surgery for neoplasm: Secondary | ICD-10-CM

## 2021-06-04 NOTE — Therapy (Signed)
[place], [place], [address] Phone: [phone]   Fax:  [phone]  Physical Therapy Treatment  Patient Details  Name: Lindsay Hampton MRN: [id number] Date of Birth: 08-19-1966 Referring Provider (PT): Dr. [name]   Encounter Date: 06/04/2021   PT End of Session - 06/04/21 1039     Visit Number 6   # unchanged due to screen only   PT Start Time 1035    PT Stop Time 1040    PT Time Calculation (min) 5 min    Activity Tolerance Patient tolerated treatment well    Behavior During Therapy [facility] for tasks assessed/performed             Past Medical History:  Diagnosis Date   Allergy    seasonal   Anemia    After childbirth   Anxiety    Breast cancer ([place])    Family history of breast cancer 07/26/2020   Family history of colon cancer 07/26/2020   Family history of prostate cancer 07/26/2020   GERD (gastroesophageal reflux disease)    Hypertensive urgency 01/08/2021   Pneumonia    In 20s    Past Surgical History:  Procedure Laterality Date   AXILLARY LYMPH NODE DISSECTION Left 08/16/2020   Procedure: LEFT TARGETED AXILLARY LYMPH NODE DISSECTION;  Surgeon: [name], MD;  Location: [facility];  Service: General;  Laterality: Left;   BREAST LUMPECTOMY WITH RADIOACTIVE SEED AND AXILLARY LYMPH NODE DISSECTION Left 08/16/2020   Procedure: LEFT BREAST LUMPECTOMY WITH RADIOACTIVE SEED;  Surgeon: [name], MD;  Location: [place];  Service: General;  Laterality: Left;  PEC BLOCK   BREAST SURGERY Left 06/2020   breast bx    CERVICAL BIOPSY  W/ LOOP ELECTRODE EXCISION  12/2017   CESAREAN SECTION     CHOLECYSTECTOMY N/A 01/09/2021   Procedure: LAPAROSCOPIC CHOLECYSTECTOMY WITH INTRAOPERATIVE CHOLANGIOGRAM;  Surgeon: Donnie Mesa, MD;  [facility];  Service: General;  Laterality: N/A;   DILATION AND CURETTAGE OF UTERUS     ERCP N/A 01/10/2021   Procedure: ENDOSCOPIC RETROGRADE  CHOLANGIOPANCREATOGRAPHY (ERCP);  Surgeon: Ladene Artist, MD;  [facility];  Service: Endoscopy;  Laterality: N/A;   PORTACATH PLACEMENT  09/12/2020   Procedure: INSERTION PORT-A-CATH WITH ULTRASOUND;  Surgeon: [name], MD;  [facility];  Service: General;;   SPHINCTEROTOMY  01/10/2021   Procedure: Joan Mayans;  Surgeon: Ladene Artist, MD;  [facility];  Service: Endoscopy;;  balloon sweep   WISDOM TOOTH EXTRACTION  2020    There were no vitals filed for this visit.   Subjective Assessment - 06/04/21 1037     Subjective Pt returns for her 3 month L-Dex screen.    Pertinent History Patient was diagnosed on 07/06/2020 with left grade II invasive ductal carcinoma breast cancer. Patient reports she underwent a left lumpectomy and sentinel node biopsy (1/2 nodes positive) on 08/16/2020. It is ER/PR positive and HER2 negative with a Ki67 of 15%. She has a positive axillary lymph node.                    L-DEX FLOWSHEETS - 06/04/21 1000       L-DEX LYMPHEDEMA SCREENING   Measurement Type Unilateral    L-DEX MEASUREMENT EXTREMITY Upper Extremity    POSITION  Standing    DOMINANT SIDE Right    At Risk Side Left    BASELINE SCORE (UNILATERAL) 4.3    L-DEX SCORE (UNILATERAL) 5.6  VALUE CHANGE (UNILAT) 1.3                                    PT Long Term Goals - 10/12/20 1136       PT LONG TERM GOAL #1   Title Patient will demonstrate she has regained full shoulder ROM and function post operatively compared to baseline measurements.    Time 4    Period Weeks    Status Achieved      PT LONG TERM GOAL #2   Title Patient will report >/= 25% reduction in left shoulder pain while performing daily tasks.    Baseline 60-70% better    Time 4    Period Weeks    Status Achieved      PT LONG TERM GOAL #3   Title Patient will report >/= 25% improvement in her ability to sleep at night    Baseline 60% better    Time  4    Period Weeks    Status Achieved      PT LONG TERM GOAL #4   Title 155 abd, 149 flex    Time 4    Status Achieved      PT LONG TERM GOAL #5   Title Pt will be fit for compression sleeve/gauntlet and will be independent with donning/doffing and proper wear    Time 4    Period Weeks    Status New    Target Date 11/09/20                   Plan - 06/04/21 1040     Clinical Impression Statement Pt returns for her 3 month L-Dex screen. Her change from baseline of 1.3 is WNLs so no further treatment is required at this time except to cont every 3 month L-Dex screen which pt is agreeable to.    PT Next Visit Plan Cont every 3 month L-Dex screens for up to 2 years from her SLNB.    Consulted and Agree with Plan of Care Patient             Patient will benefit from skilled therapeutic intervention in order to improve the following deficits and impairments:     Visit Diagnosis: Aftercare following surgery for neoplasm     Problem List Patient Active Problem List   Diagnosis Date Noted   Choledocholithiasis    RUQ abdominal pain    Hypokalemia    Acute cholecystitis    Abnormal cholangiogram    Elevated LFTs    RUQ pain 01/08/2021   Hypertensive urgency 01/08/2021   Elevated troponin 01/08/2021   Acute biliary pancreatitis without infection or necrosis    Port-A-Cath in place 11/20/2020   Genetic testing 08/01/2020   Family history of breast cancer 07/26/2020   Family history of prostate cancer 07/26/2020   Family history of colon cancer 07/26/2020   Malignant neoplasm of lower-outer quadrant of left breast of female, estrogen receptor positive ([place]) 07/20/2020    Otelia Limes, PTA 06/04/2021, 10:41 AM  [place], [place], [address] Phone: [phone]   Fax:  [phone]  Name: SHANAYA SCHNECK MRN: [id number] Date of Birth: 1965/12/10

## 2021-06-19 NOTE — Progress Notes (Signed)
55 y.o. [id number] Married White or Caucasian Not Hispanic or Latino female here for annual exam.    She just finished active treatment for left breast cancer. ER +, PT +, HER 2 equivocal. In 9/21 she had a lumpectomy and SLND 1/2 nodes + She underwent Chemo from October until March.  Radiation from 5/22-6/22 Started Arimidex last month Negative genetic testing  She tolerated her treatment well. Lost her hair, but it's growing back.   She is having tolerable vasomotor symptoms, no vaginal bleeding. Sexually active, no pain.   Had her gallbladder out in 2/22.   No bowel or bladder c/o.     Patient's last menstrual period was 07/26/2020 (approximate).          Sexually active: Yes.    The current method of family planning is post menopausal status.    Exercising: Yes.     Walking for 30 min daily  Smoker:  no  Health Maintenance: Pap: 03/22/20 ASCUS Hr HPV neg 01/19/19 neg HPV Neg,   12/15/2017 ASCUS pos HPV,  12-17-16 ASCUS + HR HPV  History of abnormal Pap: yes, Colpo 04/13/20 CIN I ECC neg H/O LEEP  In 2/19 for HSIL, pathology with CIN II, negative margins, negative ECC. MMG:  07/06/20 See Epic , will schedule with Oncology BMD:   none  Colonoscopy: 02/24/18, polyps, f/u in 5 years TDaP:  06/19/18 Gardasil: n/a   reports that she has never smoked. She has never used smokeless tobacco. She reports previous alcohol use. She reports that she does not use drugs. No ETOH. Her kids are local and working. Her husband has been a great support with her cancer diagnosis. She is not currently working.   Past Medical History:  Diagnosis Date   Allergy    seasonal   Anemia    After childbirth   Anxiety    Breast cancer ([place])    Family history of breast cancer 07/26/2020   Family history of colon cancer 07/26/2020   Family history of prostate cancer 07/26/2020   GERD (gastroesophageal reflux disease)    Hypertensive urgency 01/08/2021   Pneumonia    In 20s    Past Surgical History:   Procedure Laterality Date   AXILLARY LYMPH NODE DISSECTION Left 08/16/2020   Procedure: LEFT TARGETED AXILLARY LYMPH NODE DISSECTION;  Surgeon: [name], MD;  Location: [facility];  Service: General;  Laterality: Left;   BREAST LUMPECTOMY WITH RADIOACTIVE SEED AND AXILLARY LYMPH NODE DISSECTION Left 08/16/2020   Procedure: LEFT BREAST LUMPECTOMY WITH RADIOACTIVE SEED;  Surgeon: [name], MD;  Location: [facility];  Service: General;  Laterality: Left;  PEC BLOCK   BREAST SURGERY Left 06/2020   breast bx    CERVICAL BIOPSY  W/ LOOP ELECTRODE EXCISION  12/2017   CESAREAN SECTION     CHOLECYSTECTOMY N/A 01/09/2021   Procedure: LAPAROSCOPIC CHOLECYSTECTOMY WITH INTRAOPERATIVE CHOLANGIOGRAM;  Surgeon: Donnie Mesa, MD;  [facility];  Service: General;  Laterality: N/A;   DILATION AND CURETTAGE OF UTERUS     ERCP N/A 01/10/2021   Procedure: ENDOSCOPIC RETROGRADE CHOLANGIOPANCREATOGRAPHY (ERCP);  Surgeon: Ladene Artist, MD;  [facility];  Service: Endoscopy;  Laterality: N/A;   PORTACATH PLACEMENT  09/12/2020   Procedure: INSERTION PORT-A-CATH WITH ULTRASOUND;  Surgeon: [name], MD;  [facility];  Service: General;;   SPHINCTEROTOMY  01/10/2021   Procedure: Joan Mayans;  Surgeon: Ladene Artist, MD;  [facility];  Service: Endoscopy;;  balloon sweep   WISDOM TOOTH EXTRACTION  2020    Current Outpatient Medications  Medication Sig Dispense Refill   acetaminophen (TYLENOL) 325 MG tablet You can take 2 tablets every 4 hours as needed for pain.  Do not take over 4000 mg of Tylenol/acetaminophen per day.  Tylenol#3 is Tylenol and codeine.  You need to count each of these tablets as part of your daily maximum of Tylenol.     ALPRAZolam (XANAX) 0.25 MG tablet TAKE 1 TABLET BY MOUTH 2 TIMES DAILY AS NEEDED FOR ANXIETY. 60 tablet 3   anastrozole (ARIMIDEX) 1 MG tablet Take 1 tablet (1 mg total) by mouth daily. 90 tablet 3   gabapentin (NEURONTIN) 300 MG capsule  Take 1 capsule (300 mg total) by mouth at bedtime. 90 capsule 3   No current facility-administered medications for this visit.    Family History  Problem Relation Age of Onset   Breast cancer Mother        dx 53   Prostate cancer Father        dx late 50s/early 76s   Colon cancer Maternal Grandmother        early 69s   Breast cancer Cousin        dx 55   Esophageal cancer Maternal Grandfather        dx 72s   Cancer Maternal Aunt        maternal grandmother's sister; possible ovarian? dx 59, d. 38s-80s   Rectal cancer Neg Hx    Stomach cancer Neg Hx     Review of Systems  All other systems reviewed and are negative.  Exam:   BP 122/74   Pulse 68   Ht '5\' 6"'$  (1.676 m)   Wt 238 lb (108 kg)   LMP 07/26/2020 (Approximate)   SpO2 98%   BMI 38.41 kg/m   Weight change: '@WEIGHTCHANGE'$ @ Height:   Height: '5\' 6"'$  (167.6 cm)  Ht Readings from Last 3 Encounters:  06/21/21 '5\' 6"'$  (1.676 m)  05/14/21 '5\' 6"'$  (1.676 m)  03/29/21 '5\' 6"'$  (1.676 m)    General appearance: alert, cooperative and appears stated age Head: Normocephalic, without obvious abnormality, atraumatic Neck: no adenopathy, supple, symmetrical, trachea midline and thyroid normal to inspection and palpation Lungs: clear to auscultation bilaterally Cardiovascular: regular rate and rhythm Breasts: normal appearance, no masses or tenderness Abdomen: soft, non-tender; non distended,  no masses,  no organomegaly Extremities: extremities normal, atraumatic, no cyanosis or edema Skin: Skin color, texture, turgor normal. No rashes or lesions Lymph nodes: Cervical, supraclavicular, and axillary nodes normal. No abnormal inguinal nodes palpated Neurologic: Grossly normal   Pelvic: External genitalia:  no lesions              Urethra:  normal appearing urethra with no masses, tenderness or lesions              Bartholins and Skenes: normal                 Vagina: normal appearing vagina with normal color and discharge, no  lesions              Cervix: no lesions               Bimanual Exam:  Uterus:   no masses or tenderness              Adnexa: no mass, fullness, tenderness               Rectovaginal: Confirms  Anus:  normal sphincter tone, no lesions  Gae Dry chaperoned for the exam.  1. Well woman exam Discussed breast self exam Discussed calcium and vit D intake Labs with primary and oncology  2. Screening for cervical cancer H/O CIN I last year - Cytology - PAP with hpv  3. History of breast cancer Just completed radiation last month, on arimidex

## 2021-06-21 ENCOUNTER — Other Ambulatory Visit: Payer: Self-pay

## 2021-06-21 ENCOUNTER — Other Ambulatory Visit (HOSPITAL_COMMUNITY)
Admission: RE | Admit: 2021-06-21 | Discharge: 2021-06-21 | Disposition: A | Discharge status: Home / Self Care | Payer: 59 | Source: Ambulatory Visit | Attending: Obstetrics and Gynecology | Admitting: Obstetrics and Gynecology

## 2021-06-21 ENCOUNTER — Encounter: Payer: Self-pay | Admitting: Obstetrics and Gynecology

## 2021-06-21 ENCOUNTER — Ambulatory Visit (INDEPENDENT_AMBULATORY_CARE_PROVIDER_SITE_OTHER): Payer: 59 | Admitting: Obstetrics and Gynecology

## 2021-06-21 VITALS — BP 122/74 | HR 68 | Ht 66.0 in | Wt 238.0 lb

## 2021-06-21 DIAGNOSIS — Z853 Personal history of malignant neoplasm of breast: Secondary | ICD-10-CM

## 2021-06-21 DIAGNOSIS — Z01419 Encounter for gynecological examination (general) (routine) without abnormal findings: Secondary | ICD-10-CM

## 2021-06-21 DIAGNOSIS — Z124 Encounter for screening for malignant neoplasm of cervix: Secondary | ICD-10-CM | POA: Insufficient documentation

## 2021-06-21 NOTE — Progress Notes (Signed)
  Radiation Oncology         [phone] ________________________________  Name: Lindsay Hampton MRN: [id number]  Date of Service: 07/02/2021  DOB: [birth year]  Post Treatment Telephone Note  Diagnosis:   Stage IIA, cT2N1M0 grade 2, ER/PR positive invasive ductal carcinoma of the left breast.  Interval Since Last Radiation:  7 weeks   04/05/2021 through 05/22/2021 Site Technique Total Dose (Gy) Dose per Fx (Gy) Completed Fx Beam Energies  Breast, Left: Breast_Lt 3D 50.4/50.4 1.8 28/28 10X  Breast, Left: Breast_Lt_SCLV 3D 50.4/50.4 1.8 28/28 6X, 10X  Breast, Left: Breast_Lt_Bst 3D 10/10 2 5/5 6X, 10X    Narrative:  The patient was contacted today for routine follow-up. During treatment Lindsay Hampton did very well with radiotherapy and did not have significant desquamation. Lindsay Hampton reports Lindsay Hampton is doing well and feels like her skin is healing up very nicely and Lindsay Hampton's been using vitamin E products and avoiding the sun.  Impression/Plan: 1. Stage IIA, cT2N1M0 grade 2, ER/PR positive invasive ductal carcinoma of the left breast.. The patient has been doing well since completion of radiotherapy. We discussed that we would be happy to continue to follow her as needed, but Lindsay Hampton will also continue to follow up with Dr. Lindi Adie in medical oncology. Lindsay Hampton was counseled on skin care as well as measures to avoid sun exposure to this area.  2. Survivorship. We discussed the importance of survivorship evaluation and encouraged her to attend her upcoming visit with that clinic.       Carola Rhine, PAC

## 2021-06-21 NOTE — Patient Instructions (Signed)

## 2021-06-22 ENCOUNTER — Encounter: Payer: 59 | Admitting: Adult Health

## 2021-06-25 ENCOUNTER — Other Ambulatory Visit: Payer: Self-pay

## 2021-06-25 ENCOUNTER — Inpatient Hospital Stay: Payer: 59 | Attending: Hematology and Oncology | Admitting: Adult Health

## 2021-06-25 ENCOUNTER — Encounter: Payer: Self-pay | Admitting: Adult Health

## 2021-06-25 VITALS — BP 106/77 | HR 107 | Temp 97.7°F | Resp 20 | Wt 232.0 lb

## 2021-06-25 DIAGNOSIS — Z803 Family history of malignant neoplasm of breast: Secondary | ICD-10-CM | POA: Insufficient documentation

## 2021-06-25 DIAGNOSIS — Z800 Family history of malignant neoplasm of digestive organs: Secondary | ICD-10-CM | POA: Insufficient documentation

## 2021-06-25 DIAGNOSIS — C50512 Malignant neoplasm of lower-outer quadrant of left female breast: Secondary | ICD-10-CM | POA: Diagnosis present

## 2021-06-25 DIAGNOSIS — K219 Gastro-esophageal reflux disease without esophagitis: Secondary | ICD-10-CM | POA: Insufficient documentation

## 2021-06-25 DIAGNOSIS — E2839 Other primary ovarian failure: Secondary | ICD-10-CM

## 2021-06-25 DIAGNOSIS — Z808 Family history of malignant neoplasm of other organs or systems: Secondary | ICD-10-CM | POA: Diagnosis not present

## 2021-06-25 DIAGNOSIS — Z170 Estrogen receptor positive status [ER+]: Secondary | ICD-10-CM | POA: Diagnosis not present

## 2021-06-25 LAB — CYTOLOGY - PAP
Comment: NEGATIVE
Diagnosis: UNDETERMINED — AB
High risk HPV: NEGATIVE

## 2021-06-25 NOTE — Progress Notes (Signed)
SURVIVORSHIP VISIT:    BRIEF ONCOLOGIC HISTORY:  Oncology History  Malignant neoplasm of lower-outer quadrant of left breast of female, estrogen receptor positive ([place])  07/20/2020 Initial Diagnosis   Screening mammogram detected left breast calcifications, enlarged left axillary lymph nodes, and an asymmetry in the right breast. Diagnostic mammogram showed in the left breast, a 2.4cm mass with calcifications, 3:30 position, a left axillary lymph node with cortical thickness, and in the right breast, no suspicious masses or calcifications. Left breast biopsy showed invasive and in situ mammary carcinoma in the breast and axilla, grade 2, HER-2 equivocal by IHC (2+), negative by FISH, ER+ 95%, PR+40%, Ki67 15%.    07/26/2020 Cancer Staging   Staging form: Breast, AJCC 8th Edition - Clinical stage from 07/26/2020: Stage IIA (cT2, cN1(f), cM0, G2, ER+, PR+, HER2-) - Signed by [name], MD on 07/26/2020    08/01/2020 Genetic Testing   No pathogenic variants detected in [facility] Multi-Cancer Panel.  The Multi-Cancer Panel offered by [facility] includes sequencing and/or deletion duplication testing of the following 85 genes: AIP, ALK, APC, ATM, AXIN2,BAP1,  BARD1, BLM, BMPR1A, BRCA1, BRCA2, BRIP1, CASR, CDC73, CDH1, CDK4, CDKN1B, CDKN1C, CDKN2A (p14ARF), CDKN2A (p16INK4a), CEBPA, CHEK2, CTNNA1, DICER1, DIS3L2, EGFR (c.2369C>T, p.Thr790Met variant only), EPCAM (Deletion/duplication testing only), FH, FLCN, GATA2, GPC3, GREM1 (Promoter region deletion/duplication testing only), HOXB13 (c.251G>A, p.Gly84Glu), HRAS, KIT, MAX, MEN1, MET, MITF (c.952G>A, p.Glu318Lys variant only), MLH1, MSH2, MSH3, MSH6, MUTYH, NBN, NF1, NF2, NTHL1, PALB2, PDGFRA, PHOX2B, PMS2, POLD1, POLE, POT1, PRKAR1A, PTCH1, PTEN, RAD50, RAD51C, RAD51D, RB1, RECQL4, RET, RNF43, RUNX1, SDHAF2, SDHA (sequence changes only), SDHB, SDHC, SDHD, SMAD4, SMARCA4, SMARCB1, SMARCE1, STK11, SUFU, TERC, TERT, TMEM127, TP53, TSC1, TSC2, VHL, WRN and WT1.  The  report date is August 01, 2020.    08/16/2020 Surgery   Left lumpectomy [name]): IDC, grade 2, 2.3cm, with intermediate grade DCIS, clear margins, 1/2 left axillary lymph nodes positive for carcinoma.HER-2 equivocal by IHC (2+), negative by FISH, ER+ 95%, PR+40%, Ki67 15%.    08/24/2020 Cancer Staging   Staging form: Breast, AJCC 8th Edition - Pathologic stage from 08/24/2020: Stage IB (pT2, pN1a, cM0, G2, ER+, PR+, HER2-) - Signed by [name], MD on 08/24/2020    08/30/2020 Miscellaneous   MammaPrint high risk   09/13/2020 - 02/19/2021 Adjuvant Chemotherapy   Adriamycin/Cytoxan x 4 cycles followed by Taxol weekly x 12 (dose reduced cycles 5-12)   04/05/2021 - 05/22/2021 Radiation Therapy   Site Technique Total Dose (Gy) Dose per Fx (Gy) Completed Fx Beam Energies  Breast, Left: Breast_Lt 3D 50.4/50.4 1.8 28/28 10X  Breast, Left: Breast_Lt_SCLV 3D 50.4/50.4 1.8 28/28 6X, 10X  Breast, Left: Breast_Lt_Bst 3D 10/10 2 5/5 6X, 10X      05/29/2021 -  Anti-estrogen oral therapy   Anastrozole daily     INTERVAL HISTORY:  Ms. Dorough to review her survivorship care plan detailing her treatment course for breast cancer, as well as monitoring long-term side effects of that treatment, education regarding health maintenance, screening, and overall wellness and health promotion.     Overall, Ms. Song reports feeling quite well.  She is taking anastrozole daily and is doing well other than some hot flashes.  She notes some indigestion and wants to know what she should take for this.    REVIEW OF SYSTEMS:  Review of Systems  Constitutional:  Negative for appetite change, chills, fatigue, fever and unexpected weight change.  HENT:   Negative for hearing loss, lump/mass and trouble swallowing.   Eyes:  Negative for eye problems and icterus.  Respiratory:  Negative for chest tightness, cough and shortness of breath.   Cardiovascular:  Negative for chest pain, leg swelling and palpitations.   Gastrointestinal:  Negative for abdominal distention, abdominal pain, constipation, diarrhea, nausea and vomiting.  Endocrine: Positive for hot flashes.  Genitourinary:  Negative for difficulty urinating.   Musculoskeletal:  Negative for arthralgias.  Skin:  Negative for itching and rash.  Neurological:  Negative for dizziness, extremity weakness, headaches and numbness.  Hematological:  Negative for adenopathy. Does not bruise/bleed easily.  Psychiatric/Behavioral:  Negative for depression. The patient is not nervous/anxious.   Breast: Denies any new nodularity, masses, tenderness, nipple changes, or nipple discharge.      ONCOLOGY TREATMENT TEAM:  1. Surgeon:  Dr. [name] at [facility] 2. Medical Oncologist: Dr. Lindi Adie  3. Radiation Oncologist: Dr. Lisbeth Renshaw    PAST MEDICAL/SURGICAL HISTORY:  Past Medical History:  Diagnosis Date   Acute biliary pancreatitis without infection or necrosis    Acute cholecystitis    Allergy    seasonal   Anemia    After childbirth   Anxiety    Breast cancer ([place])    Elevated LFTs    Family history of breast cancer 07/26/2020   Family history of colon cancer 07/26/2020   Family history of prostate cancer 07/26/2020   GERD (gastroesophageal reflux disease)    Hypertensive urgency 01/08/2021   Pneumonia    In 20s   Past Surgical History:  Procedure Laterality Date   AXILLARY LYMPH NODE DISSECTION Left 08/16/2020   Procedure: LEFT TARGETED AXILLARY LYMPH NODE DISSECTION;  Surgeon: [name], MD;  Location: [facility];  Service: General;  Laterality: Left;   BREAST LUMPECTOMY WITH RADIOACTIVE SEED AND AXILLARY LYMPH NODE DISSECTION Left 08/16/2020   Procedure: LEFT BREAST LUMPECTOMY WITH RADIOACTIVE SEED;  Surgeon: [name], MD;  Location: [facility];  Service: General;  Laterality: Left;  PEC BLOCK   BREAST SURGERY Left 06/2020   breast bx    CERVICAL BIOPSY  W/ LOOP ELECTRODE EXCISION  12/2017   CESAREAN SECTION     CHOLECYSTECTOMY N/A  01/09/2021   Procedure: LAPAROSCOPIC CHOLECYSTECTOMY WITH INTRAOPERATIVE CHOLANGIOGRAM;  Surgeon: Donnie Mesa, MD;  Location: [facility];  Service: General;  Laterality: N/A;   DILATION AND CURETTAGE OF UTERUS     ERCP N/A 01/10/2021   Procedure: ENDOSCOPIC RETROGRADE CHOLANGIOPANCREATOGRAPHY (ERCP);  Surgeon: Ladene Artist, MD;  Location: [facility];  Service: Endoscopy;  Laterality: N/A;   PORTACATH PLACEMENT  09/12/2020   Procedure: INSERTION PORT-A-CATH WITH ULTRASOUND;  Surgeon: [name], MD;  Location: [facility];  Service: General;;   SPHINCTEROTOMY  01/10/2021   Procedure: Joan Mayans;  Surgeon: Ladene Artist, MD;  Location: [facility];  Service: Endoscopy;;  balloon sweep   WISDOM TOOTH EXTRACTION  2020     ALLERGIES:  No Known Allergies   CURRENT MEDICATIONS:  Outpatient Encounter Medications as of 06/25/2021  Medication Sig   Calcium Carbonate-Vit D-Min (CALCIUM 1200 PO) Take by mouth daily.   Multiple Vitamin (MULTIVITAMIN) tablet Take 1 tablet by mouth daily.   vitamin C (ASCORBIC ACID) 500 MG tablet Take 500 mg by mouth daily.   VITAMIN D PO Take 5,000 Units by mouth daily.   acetaminophen (TYLENOL) 325 MG tablet You can take 2 tablets every 4 hours as needed for pain.  Do not take over 4000 mg of Tylenol/acetaminophen per day.  Tylenol#3 is Tylenol and codeine.  You need to count each of these  tablets as part of your daily maximum of Tylenol.   ALPRAZolam (XANAX) 0.25 MG tablet TAKE 1 TABLET BY MOUTH 2 TIMES DAILY AS NEEDED FOR ANXIETY.   anastrozole (ARIMIDEX) 1 MG tablet Take 1 tablet (1 mg total) by mouth daily.   gabapentin (NEURONTIN) 300 MG capsule Take 1 capsule (300 mg total) by mouth at bedtime.   [DISCONTINUED] prochlorperazine (COMPAZINE) 10 MG tablet Take 1 tablet (10 mg total) by mouth every 6 (six) hours as needed (Nausea or vomiting). (Patient not taking: Reported on 03/29/2021)   No facility-administered encounter medications on file as of 06/25/2021.      ONCOLOGIC FAMILY HISTORY:  Family History  Problem Relation Age of Onset   Breast cancer Mother        dx 21   Prostate cancer Father        dx late 50s/early 72s   Colon cancer Maternal Grandmother        early 40s   Breast cancer Cousin        dx 21   Esophageal cancer Maternal Grandfather        dx 55s   Cancer Maternal Aunt        maternal grandmother's sister; possible ovarian? dx 58, d. 71s-80s   Rectal cancer Neg Hx    Stomach cancer Neg Hx      GENETIC COUNSELING/TESTING: See above  SOCIAL HISTORY:  Social History   Socioeconomic History   Marital status: Married    Spouse name: Not on file   Number of children: Not on file   Years of education: Not on file   Highest education level: Not on file  Occupational History   Not on file  Tobacco Use   Smoking status: Never   Smokeless tobacco: Never  Vaping Use   Vaping Use: Never used  Substance and Sexual Activity   Alcohol use: Not Currently    Comment: not since breast cancer dx   Drug use: No   Sexual activity: Not Currently    Partners: Male    Birth control/protection: None  Other Topics Concern   Not on file  Social History Narrative   Not on file   Social Determinants of Health   Financial Resource Strain: Low Risk    Difficulty of Paying Living Expenses: Not hard at all  Food Insecurity: No Food Insecurity   Worried About Charity fundraiser in the Last Year: Never true   [place] in the Last Year: Never true  Transportation Needs: No Transportation Needs   Lack of Transportation (Medical): No   Lack of Transportation (Non-Medical): No  Physical Activity: Not on file  Stress: Not on file  Social Connections: Not on file  Intimate Partner Violence: Not on file     OBSERVATIONS/OBJECTIVE:  BP 106/77 (BP Location: Right Arm, Patient Position: Sitting)   Pulse (!) 107   Temp 97.7 F (36.5 C) (Temporal)   Resp 20   Wt 232 lb (105.2 kg)   SpO2 100%   BMI 37.45 kg/m   GENERAL: Patient is a well appearing female in no acute distress HEENT:  Sclerae anicteric.  Oropharynx clear and moist. No ulcerations or evidence of oropharyngeal candidiasis. Neck is supple.  NODES:  No cervical, supraclavicular, or axillary lymphadenopathy palpated.  BREAST EXAM:  left breast s/p lumpectomy and radiation, no sign of local recurrence, right breast benign LUNGS:  Clear to auscultation bilaterally.  No wheezes or rhonchi. HEART:  Regular rate and rhythm.  No murmur appreciated. ABDOMEN:  Soft, nontender.  Positive, normoactive bowel sounds. No organomegaly palpated. MSK:  No focal spinal tenderness to palpation. Full range of motion bilaterally in the upper extremities. EXTREMITIES:  No peripheral edema.   SKIN:  Clear with no obvious rashes or skin changes. No nail dyscrasia. NEURO:  Nonfocal. Well oriented.  Appropriate affect.   LABORATORY DATA:  None for this visit.  DIAGNOSTIC IMAGING:  None for this visit.      ASSESSMENT AND PLAN:  Ms.. Furber is a pleasant 55 y.o. female with Stage IB left breast invasive ductal carcinoma, ER+/PR+/HER2-, diagnosed in 06/2020, treated with lumpectomy, adjuvant chemotherapy, adjuvant radiation therapy, and anti-estrogen therapy with Anastrozole beginning in 05/2021.  She presents to the Survivorship Clinic for our initial meeting and routine follow-up post-completion of treatment for breast cancer.    1. Stage IB left breast cancer:  Ms. Ciresi is continuing to recover from definitive treatment for breast cancer. She will follow-up with her medical oncologist, Dr. Lindi Adie in 6 months with history and physical exam per surveillance protocol.  She will continue her anti-estrogen therapy with Anastrozole. Thus far, she is tolerating the Anastrozole well, with minimal side effects. She was instructed to make Dr. Lindi Adie or myself aware if she begins to experience any worsening side effects of the medication and I could see her back in clinic  to help manage those side effects, as needed. Her mammogram is due 10/2021; orders placed today.   Today, a comprehensive survivorship care plan and treatment summary was reviewed with the patient today detailing her breast cancer diagnosis, treatment course, potential late/long-term effects of treatment, appropriate follow-up care with recommendations for the future, and patient education resources.  A copy of this summary, along with a letter will be sent to the patient's primary care provider via mail/fax/In Basket message after today's visit.    2. GERD: She is going to try taking pepcid first to see if it helps.  If not, she will try omeprazole OTC.    3. Bone health:  Given Ms. Roethler's age/history of breast cancer and her current treatment regimen including anti-estrogen therapy with Anastrozole, she is at risk for bone demineralization. She has not undergone bone density testing, and I ordered this for her to be completed in 10/2021 when she undergoes her mammogram.   In the meantime, she was encouraged to increase her consumption of foods rich in calcium, as well as increase her weight-bearing activities.  She was given education on specific activities to promote bone health.  4. Cancer screening:  Due to Ms. Neuzil's history and her age, she should receive screening for skin cancers, colon cancer, and gynecologic cancers.  The information and recommendations are listed on the patient's comprehensive care plan/treatment summary and were reviewed in detail with the patient. I recommended she f/u with her PCP about receiving the Shingrix vaccine.    5. Health maintenance and wellness promotion: Ms. Yepez was encouraged to consume 5-7 servings of fruits and vegetables per day. We reviewed the "Nutrition Rainbow" handout, as well as the handout "Take Control of Your Health and Reduce Your Cancer Risk" from the [employer].  She was also encouraged to engage in moderate to vigorous  exercise for 30 minutes per day most days of the week. We discussed the [employer] fitness program, which is designed for cancer survivors to help them become more physically fit after cancer treatments.  She was instructed to limit her alcohol consumption and continue  to abstain from tobacco use.     6. Support services/counseling: It is not uncommon for this period of the patient's cancer care trajectory to be one of many emotions and stressors.  We discussed how this can be increasingly difficult during the times of quarantine and social distancing due to the COVID-19 pandemic.   She was given information regarding our available services and encouraged to contact me with any questions or for help enrolling in any of our support group/programs.    Follow up instructions:    -Return to cancer center in 6 months for f/u with Dr. Lindi Adie  -Mammogram due in 10/2021 -Bone density in 10/2021 -Follow up with surgery 04/2022 -She is welcome to return back to the Survivorship Clinic at any time; no additional follow-up needed at this time.  -Consider referral back to survivorship as a long-term survivor for continued surveillance  The patient was provided an opportunity to ask questions and all were answered. The patient agreed with the plan and demonstrated an understanding of the instructions.   Total encounter time: 40 minutes in SCP preparation, face to face visit time, lab review, chart review, order entry, care coordination, and documentation of the encounter.   Wilber Bihari, NP 06/25/21 12:06 PM Medical Oncology and Hematology [facility] [place], [place] [address] Tel. [phone]    Fax. [phone]  *Total Encounter Time as defined by the Centers for Medicare and Medicaid Services includes, in addition to the face-to-face time of a patient visit (documented in the note above) non-face-to-face time: obtaining and reviewing outside history, ordering and  reviewing medications, tests or procedures, care coordination (communications with other health care professionals or caregivers) and documentation in the medical record.

## 2021-07-02 ENCOUNTER — Other Ambulatory Visit: Payer: Self-pay

## 2021-07-02 ENCOUNTER — Ambulatory Visit
Admission: RE | Admit: 2021-07-02 | Discharge: 2021-07-02 | Disposition: A | Discharge status: Home / Self Care | Payer: 59 | Source: Ambulatory Visit | Attending: Radiation Oncology | Admitting: Radiation Oncology

## 2021-07-02 DIAGNOSIS — C50512 Malignant neoplasm of lower-outer quadrant of left female breast: Secondary | ICD-10-CM | POA: Insufficient documentation

## 2021-07-02 DIAGNOSIS — Z170 Estrogen receptor positive status [ER+]: Secondary | ICD-10-CM | POA: Insufficient documentation

## 2021-08-02 ENCOUNTER — Telehealth: Payer: Self-pay | Admitting: *Deleted

## 2021-08-02 NOTE — Telephone Encounter (Signed)
Received call from pt stating she tested positive for Covid 08/01/21.  Pt symptoms include; headache, body aches, sinus drainage and fever.  Pt states oral temperature was 101.5 and came down to 99.0 with the use of tylenol.  Per MD pt does not need antivirals at this time.  Pt educated to continue taking OTC tylenol q6 hrs to alleviate fever and body aches.  Pt educated to call the office if symptoms worsen.  Pt verbalized understanding.

## 2021-09-03 ENCOUNTER — Other Ambulatory Visit: Payer: Self-pay | Admitting: Radiation Oncology

## 2021-09-03 ENCOUNTER — Telehealth: Payer: Self-pay | Admitting: *Deleted

## 2021-09-03 ENCOUNTER — Encounter: Payer: Self-pay | Admitting: Adult Health

## 2021-09-03 ENCOUNTER — Ambulatory Visit: Payer: 59

## 2021-09-03 DIAGNOSIS — C50512 Malignant neoplasm of lower-outer quadrant of left female breast: Secondary | ICD-10-CM

## 2021-09-03 NOTE — Telephone Encounter (Signed)
Received call from pt with complaint of skin changes on left breast x1 week.  Pt notes the mores are bigger and is experiencing a tingling sensation.  Pt denies fever or recent injury/trauma.  Pt requesting in office appt for breast exam and evaluation.  Appt scheduled and pt verbalized understanding of date and time.

## 2021-09-04 ENCOUNTER — Telehealth: Payer: Self-pay | Admitting: *Deleted

## 2021-09-04 NOTE — Telephone Encounter (Signed)
Spoke with the patient to let her know that I relayed her concerns to the PA who feels this could be lymphedema in her breast.  I let the patient know that a referral has been sent over to physical therapy and they will reach out to her with an appointment day and time.  She verbalized understanding.  She states she will see Thedore Mins NP tomorrow.  Gloriajean Dell. [facility]

## 2021-09-05 ENCOUNTER — Other Ambulatory Visit: Payer: Self-pay

## 2021-09-05 ENCOUNTER — Encounter: Payer: Self-pay | Admitting: Adult Health

## 2021-09-05 ENCOUNTER — Inpatient Hospital Stay: Payer: 59 | Attending: Hematology and Oncology | Admitting: Adult Health

## 2021-09-05 VITALS — BP 119/91 | HR 91 | Temp 97.7°F | Resp 18 | Ht 66.0 in | Wt 231.9 lb

## 2021-09-05 DIAGNOSIS — Z9221 Personal history of antineoplastic chemotherapy: Secondary | ICD-10-CM | POA: Insufficient documentation

## 2021-09-05 DIAGNOSIS — C50512 Malignant neoplasm of lower-outer quadrant of left female breast: Secondary | ICD-10-CM | POA: Diagnosis present

## 2021-09-05 DIAGNOSIS — Z923 Personal history of irradiation: Secondary | ICD-10-CM | POA: Insufficient documentation

## 2021-09-05 DIAGNOSIS — Z79811 Long term (current) use of aromatase inhibitors: Secondary | ICD-10-CM | POA: Insufficient documentation

## 2021-09-05 DIAGNOSIS — Z170 Estrogen receptor positive status [ER+]: Secondary | ICD-10-CM | POA: Diagnosis not present

## 2021-09-05 NOTE — Progress Notes (Signed)
[name] up:    Lindsay Rasmussen, MD [address] [address]   DIAGNOSIS: Cancer Staging Malignant neoplasm of lower-outer quadrant of left breast of female, estrogen receptor positive ([place]) Staging form: Breast, AJCC 8th Edition - Clinical stage from 07/26/2020: Stage IIA (cT2, cN1(f), cM0, G2, ER+, PR+, HER2-) - Signed by [name], MD on 07/26/2020 Stage prefix: Initial diagnosis Method of lymph node assessment: Core biopsy Histologic grading system: 3 grade system - Pathologic stage from 08/24/2020: Stage IB (pT2, pN1a, cM0, G2, ER+, PR+, HER2-) - Signed by [name], MD on 08/24/2020 Stage prefix: Initial diagnosis Histologic grading system: 3 grade system   SUMMARY OF ONCOLOGIC HISTORY: Oncology History  Malignant neoplasm of lower-outer quadrant of left breast of female, estrogen receptor positive ([place])  07/20/2020 Initial Diagnosis   Screening mammogram detected left breast calcifications, enlarged left axillary lymph nodes, and an asymmetry in the right breast. Diagnostic mammogram showed in the left breast, a 2.4cm mass with calcifications, 3:30 position, a left axillary lymph node with cortical thickness, and in the right breast, no suspicious masses or calcifications. Left breast biopsy showed invasive and in situ mammary carcinoma in the breast and axilla, grade 2, HER-2 equivocal by IHC (2+), negative by FISH, ER+ 95%, PR+40%, Ki67 15%.    07/26/2020 Cancer Staging   Staging form: Breast, AJCC 8th Edition - Clinical stage from 07/26/2020: Stage IIA (cT2, cN1(f), cM0, G2, ER+, PR+, HER2-) - Signed by [name], MD on 07/26/2020   08/01/2020 Genetic Testing   No pathogenic variants detected in [facility] Multi-Cancer Panel.  The Multi-Cancer Panel offered by [facility] includes sequencing and/or deletion duplication testing of the following 85 genes: AIP, ALK, APC, ATM, AXIN2,BAP1,  BARD1, BLM, BMPR1A, BRCA1, BRCA2, BRIP1, CASR,  CDC73, CDH1, CDK4, CDKN1B, CDKN1C, CDKN2A (p14ARF), CDKN2A (p16INK4a), CEBPA, CHEK2, CTNNA1, DICER1, DIS3L2, EGFR (c.2369C>T, p.Thr790Met variant only), EPCAM (Deletion/duplication testing only), FH, FLCN, GATA2, GPC3, GREM1 (Promoter region deletion/duplication testing only), HOXB13 (c.251G>A, p.Gly84Glu), HRAS, KIT, MAX, MEN1, MET, MITF (c.952G>A, p.Glu318Lys variant only), MLH1, MSH2, MSH3, MSH6, MUTYH, NBN, NF1, NF2, NTHL1, PALB2, PDGFRA, PHOX2B, PMS2, POLD1, POLE, POT1, PRKAR1A, PTCH1, PTEN, RAD50, RAD51C, RAD51D, RB1, RECQL4, RET, RNF43, RUNX1, SDHAF2, SDHA (sequence changes only), SDHB, SDHC, SDHD, SMAD4, SMARCA4, SMARCB1, SMARCE1, STK11, SUFU, TERC, TERT, TMEM127, TP53, TSC1, TSC2, VHL, WRN and WT1.  The report date is August 01, 2020.    08/16/2020 Surgery   Left lumpectomy [name]): IDC, grade 2, 2.3cm, with intermediate grade DCIS, clear margins, 1/2 left axillary lymph nodes positive for carcinoma.HER-2 equivocal by IHC (2+), negative by FISH, ER+ 95%, PR+40%, Ki67 15%.    08/24/2020 Cancer Staging   Staging form: Breast, AJCC 8th Edition - Pathologic stage from 08/24/2020: Stage IB (pT2, pN1a, cM0, G2, ER+, PR+, HER2-) - Signed by [name], MD on 08/24/2020   08/30/2020 Miscellaneous   MammaPrint high risk   09/13/2020 - 02/19/2021 Adjuvant Chemotherapy   Adriamycin/Cytoxan x 4 cycles followed by Taxol weekly x 12 (dose reduced cycles 5-12)   04/05/2021 - 05/22/2021 Radiation Therapy   Site Technique Total Dose (Gy) Dose per Fx (Gy) Completed Fx Beam Energies  Breast, Left: Breast_Lt 3D 50.4/50.4 1.8 28/28 10X  Breast, Left: Breast_Lt_SCLV 3D 50.4/50.4 1.8 28/28 6X, 10X  Breast, Left: Breast_Lt_Bst 3D 10/10 2 5/5 6X, 10X      05/29/2021 -  Anti-estrogen oral therapy   Anastrozole daily     CURRENT THERAPY: Anastrozole  INTERVAL HISTORY:  Lindsay Hampton 55 y.o. female returns for a breast exam.  She notes that over the past week she has noted some skin changes in her left  breast and is unsure if she should be concerned about them.  She denies any breast nodules, masses, nipple drainage.     Patient Active Problem List   Diagnosis Date Noted   Genetic testing 08/01/2020   Family history of breast cancer 07/26/2020   Family history of prostate cancer 07/26/2020   Family history of colon cancer 07/26/2020   Malignant neoplasm of lower-outer quadrant of left breast of female, estrogen receptor positive ([place]) 07/20/2020    has No Known Allergies.  MEDICAL HISTORY: Past Medical History:  Diagnosis Date   Acute biliary pancreatitis without infection or necrosis    Acute cholecystitis    Allergy    seasonal   Anemia    After childbirth   Anxiety    Breast cancer ([place])    Elevated LFTs    Family history of breast cancer 07/26/2020   Family history of colon cancer 07/26/2020   Family history of prostate cancer 07/26/2020   GERD (gastroesophageal reflux disease)    Hypertensive urgency 01/08/2021   Pneumonia    In 20s    SURGICAL HISTORY: Past Surgical History:  Procedure Laterality Date   AXILLARY LYMPH NODE DISSECTION Left 08/16/2020   Procedure: LEFT TARGETED AXILLARY LYMPH NODE DISSECTION;  Surgeon: [name], MD;  Location: [facility];  Service: General;  Laterality: Left;   BREAST LUMPECTOMY WITH RADIOACTIVE SEED AND AXILLARY LYMPH NODE DISSECTION Left 08/16/2020   Procedure: LEFT BREAST LUMPECTOMY WITH RADIOACTIVE SEED;  Surgeon: [name], MD;  Location: [facility];  Service: General;  Laterality: Left;  PEC BLOCK   BREAST SURGERY Left 06/2020   breast bx    CERVICAL BIOPSY  W/ LOOP ELECTRODE EXCISION  12/2017   CESAREAN SECTION     CHOLECYSTECTOMY N/A 01/09/2021   Procedure: LAPAROSCOPIC CHOLECYSTECTOMY WITH INTRAOPERATIVE CHOLANGIOGRAM;  Surgeon: Donnie Mesa, MD;  [facility];  Service: General;  Laterality: N/A;   DILATION AND CURETTAGE OF UTERUS     ERCP N/A 01/10/2021   Procedure: ENDOSCOPIC RETROGRADE CHOLANGIOPANCREATOGRAPHY (ERCP);   Surgeon: Ladene Artist, MD;  [facility];  Service: Endoscopy;  Laterality: N/A;   PORTACATH PLACEMENT  09/12/2020   Procedure: INSERTION PORT-A-CATH WITH ULTRASOUND;  Surgeon: [name], MD;  [facility];  Service: General;;   SPHINCTEROTOMY  01/10/2021   Procedure: Joan Mayans;  Surgeon: Ladene Artist, MD;  [facility];  Service: Endoscopy;;  balloon sweep   WISDOM TOOTH EXTRACTION  2020    SOCIAL HISTORY: Social History   Socioeconomic History   Marital status: Married    Spouse name: Not on file   Number of children: Not on file   Years of education: Not on file   Highest education level: Not on file  Occupational History   Not on file  Tobacco Use   Smoking status: Never   Smokeless tobacco: Never  Vaping Use   Vaping Use: Never used  Substance and Sexual Activity   Alcohol use: Not Currently    Comment: not since breast cancer dx   Drug use: No   Sexual activity: Not Currently    Partners: Male    Birth control/protection: None  Other Topics Concern   Not on file  Social History Narrative   Not on file   Social Determinants of Health   Financial Resource Strain: Not on  file  Food Insecurity: Not on file  Transportation Needs: Not on file  Physical Activity: Not on file  Stress: Not on file  Social Connections: Not on file  Intimate Partner Violence: Not on file    FAMILY HISTORY: Family History  Problem Relation Age of Onset   Breast cancer Mother        dx 31   Prostate cancer Father        dx late 50s/early 40s   Colon cancer Maternal Grandmother        early 85s   Breast cancer Cousin        dx 64   Esophageal cancer Maternal Grandfather        dx 73s   Cancer Maternal Aunt        maternal grandmother's sister; possible ovarian? dx 70, d. 60s-80s   Rectal cancer Neg Hx    Stomach cancer Neg Hx     Review of Systems  Constitutional:  Negative for appetite change, chills, fatigue, fever and unexpected  weight change.  HENT:   Negative for hearing loss, lump/mass and trouble swallowing.   Eyes:  Negative for eye problems and icterus.  Respiratory:  Negative for chest tightness, cough and shortness of breath.   Cardiovascular:  Negative for chest pain, leg swelling and palpitations.  Gastrointestinal:  Negative for abdominal distention, abdominal pain, constipation, diarrhea, nausea and vomiting.  Endocrine: Negative for hot flashes.  Genitourinary:  Negative for difficulty urinating.   Musculoskeletal:  Negative for arthralgias.  Skin:  Negative for itching and rash.  Neurological:  Negative for dizziness, extremity weakness, headaches and numbness.  Hematological:  Negative for adenopathy. Does not bruise/bleed easily.  Psychiatric/Behavioral:  Negative for depression. The patient is not nervous/anxious.      PHYSICAL EXAMINATION  ECOG PERFORMANCE STATUS: 1 - Symptomatic but completely ambulatory  Vitals:   09/05/21 1018  BP: (!) 119/91  Pulse: 91  Resp: 18  Temp: 97.7 F (36.5 C)  SpO2: 100%    Physical Exam Constitutional:      General: She is not in acute distress.    Appearance: Normal appearance. She is not toxic-appearing.  HENT:     Head: Normocephalic and atraumatic.  Eyes:     General: No scleral icterus. Cardiovascular:     Rate and Rhythm: Normal rate and regular rhythm.     Pulses: Normal pulses.     Heart sounds: Normal heart sounds.  Pulmonary:     Effort: Pulmonary effort is normal.     Breath sounds: Normal breath sounds.  Chest:     Comments: Left breast: s/p lumpectomy and radiation, with some radiation changes noted, and mild swelling consistent with breast lymphedema, no nodules, masses, or sign of recurrence noted. No warmth or erythema noted.  No nipple drainage.  Right breast: benign Abdominal:     General: Abdomen is flat. Bowel sounds are normal. There is no distension.     Palpations: Abdomen is soft.     Tenderness: There is no abdominal  tenderness.  Musculoskeletal:        General: No swelling.     Cervical back: Neck supple.  Lymphadenopathy:     Cervical: No cervical adenopathy.  Skin:    General: Skin is warm and dry.     Findings: No rash.  Neurological:     General: No focal deficit present.     Mental Status: She is alert.  Psychiatric:        Mood and  Affect: Mood normal.        Behavior: Behavior normal.    LABORATORY DATA:  CBC    Component Value Date/Time   WBC 6.2 04/26/2021 1051   WBC 6.7 01/11/2021 0300   RBC 4.41 04/26/2021 1051   HGB 12.8 04/26/2021 1051   HGB 13.1 12/15/2017 1515   HCT 39.2 04/26/2021 1051   HCT 39.2 12/15/2017 1515   PLT 279 04/26/2021 1051   PLT 360 12/15/2017 1515   MCV 88.9 04/26/2021 1051   MCV 93 12/15/2017 1515   MCH 29.0 04/26/2021 1051   MCHC 32.7 04/26/2021 1051   RDW 14.6 04/26/2021 1051   RDW 13.4 12/15/2017 1515   LYMPHSABS 0.9 04/26/2021 1051   MONOABS 0.6 04/26/2021 1051   EOSABS 0.2 04/26/2021 1051   BASOSABS 0.0 04/26/2021 1051    CMP     Component Value Date/Time   NA 140 04/26/2021 1051   NA 139 12/15/2017 1515   K 4.2 04/26/2021 1051   CL 106 04/26/2021 1051   CO2 24 04/26/2021 1051   GLUCOSE 91 04/26/2021 1051   BUN 14 04/26/2021 1051   BUN 14 12/15/2017 1515   CREATININE 0.72 04/26/2021 1051   CREATININE 0.94 12/17/2016 1248   CALCIUM 10.1 04/26/2021 1051   PROT 7.4 04/26/2021 1051   PROT 6.8 03/16/2018 1353   ALBUMIN 4.0 04/26/2021 1051   ALBUMIN 4.6 03/16/2018 1353   AST 22 04/26/2021 1051   ALT 22 04/26/2021 1051   ALKPHOS 75 04/26/2021 1051   BILITOT 0.6 04/26/2021 1051   GFRNONAA >60 04/26/2021 1051   GFRAA >60 08/14/2020 0958   GFRAA >60 07/26/2020 0801      ASSESSMENT and THERAPY PLAN:   Malignant neoplasm of lower-outer quadrant of left breast of female, estrogen receptor positive ([place]) 08/16/2020:Left lumpectomy [name]): IDC, grade 2, 2.3cm, with intermediate grade DCIS, clear margins, 1/2 left axillary lymph  nodes positive for carcinoma.HER-2 equivocal by IHC (2+), negative by FISH, ER+ 95%, PR+40%, Ki67 15%.  T2 N1 M0 stage Ib MammaPrint: High risk (average 10-year risk untreated: 29%, predicted benefit of chemo with hormone therapy at 5 years: 94.6%   Treatment plan:  1. Adjuvant chemotherapy with dose dense Adriamycin and Cytoxan x4 followed by Taxol  completed 02/19/21 2. Followed by adjuvant radiation 04/06/21- 05/22/21 3. Followed by adjuvant antiestrogen therapy ------------------------------------------------------------------------------------------------------------------------------------ Anastrozole daily, tolerating well  Her physical exam is consistent with breast lymphedema.  I placed a referral to PT, and let her knwo they moved to the [place] location.    In the meantime, I counseled her on gentle breast massage with vitamin e or coconut oil.    She will proceed with her diagnostic mammogram on 10/25/2021 as ordered to establish her new baseline since breast cancer treatment.  Should her breast worsen in the interim we will move this up.    I reviewed the above with Lindsay Hampton in detail and she verbalized agreement and understanding.  She knows to call for any questions that may arise between now and her next appointment.  We are happy to see her sooner if needed.   All questions were answered. The patient knows to call the clinic with any problems, questions or concerns. We can certainly see the patient much sooner if necessary.  Total encounter time: 20 minutes in face to face visit time, chart review, and documentation of the encounter.   Wilber Bihari, NP 09/05/21 1:09 PM Medical Oncology and Hematology [facility] [place],  [place] [address] Tel. [phone]    Fax. [phone]  *Total Encounter Time as defined by the Centers for Medicare and Medicaid Services includes, in addition to the face-to-face time of a patient visit (documented in the  note above) non-face-to-face time: obtaining and reviewing outside history, ordering and reviewing medications, tests or procedures, care coordination (communications with other health care professionals or caregivers) and documentation in the medical record.

## 2021-09-05 NOTE — Assessment & Plan Note (Signed)
08/16/2020:Left lumpectomy Lindsay Hampton): IDC, grade 2, 2.3cm, with intermediate grade DCIS, clear margins, 1/2 left axillary lymph nodes positive for carcinoma.HER-2 equivocal by IHC (2+), negative by FISH, ER+ 95%, PR+40%, Ki67 15%. T2 N1 M0 stage Ib MammaPrint: High risk (average 10-year risk untreated: 29%, predicted benefit of chemo with hormone therapy at 5 years: 94.6%  Treatment plan: 1.Adjuvant chemotherapy withdose dense Adriamycin and Cytoxan x4 followed by Taxol completed 02/19/21 2.Followed by adjuvant radiation 04/06/21- 05/22/21 3.Followed byadjuvant antiestrogen therapy ------------------------------------------------------------------------------------------------------------------------------------ Anastrozole daily, tolerating well  Her physical exam is consistent with breast lymphedema.  I placed a referral to PT, and let her knwo they moved to the [place] location.    In the meantime, I counseled her on gentle breast massage with vitamin e or coconut oil.    She will proceed with her diagnostic mammogram on 10/25/2021 as ordered to establish her new baseline since breast cancer treatment.  Should her breast worsen in the interim we will move this up.    I reviewed the above with Lindsay Hampton in detail and she verbalized agreement and understanding.  She knows to call for any questions that may arise between now and her next appointment.  We are happy to see her sooner if needed.

## 2021-09-13 ENCOUNTER — Encounter: Payer: Self-pay | Admitting: Rehabilitation

## 2021-09-13 ENCOUNTER — Other Ambulatory Visit: Payer: Self-pay

## 2021-09-13 ENCOUNTER — Ambulatory Visit: Payer: 59 | Attending: Surgery | Admitting: Rehabilitation

## 2021-09-13 DIAGNOSIS — Z170 Estrogen receptor positive status [ER+]: Secondary | ICD-10-CM | POA: Diagnosis present

## 2021-09-13 DIAGNOSIS — C50512 Malignant neoplasm of lower-outer quadrant of left female breast: Secondary | ICD-10-CM | POA: Diagnosis present

## 2021-09-13 DIAGNOSIS — Z483 Aftercare following surgery for neoplasm: Secondary | ICD-10-CM | POA: Insufficient documentation

## 2021-09-13 DIAGNOSIS — L599 Disorder of the skin and subcutaneous tissue related to radiation, unspecified: Secondary | ICD-10-CM | POA: Diagnosis present

## 2021-09-13 NOTE — Therapy (Signed)
[facility] @ [place], [place], [address] Phone: [phone]   Fax:  [phone]  Physical Therapy Treatment  Patient Details  Name: Lindsay Hampton MRN: [id number] Date of Birth: Aug 19, 1966 Referring Provider (PT): Shona Simpson, [place]   Encounter Date: 09/13/2021   PT End of Session - 09/13/21 0930     Visit Number 7    Number of Visits 8    Date for PT Re-Evaluation 10/04/21    PT Start Time 0848    PT Stop Time 0928    PT Time Calculation (min) 40 min    Activity Tolerance Patient tolerated treatment well    Behavior During Therapy [facility] for tasks assessed/performed             Past Medical History:  Diagnosis Date   Acute biliary pancreatitis without infection or necrosis    Acute cholecystitis    Allergy    seasonal   Anemia    After childbirth   Anxiety    Breast cancer ([place])    Elevated LFTs    Family history of breast cancer 07/26/2020   Family history of colon cancer 07/26/2020   Family history of prostate cancer 07/26/2020   GERD (gastroesophageal reflux disease)    Hypertensive urgency 01/08/2021   Pneumonia    In 20s    Past Surgical History:  Procedure Laterality Date   AXILLARY LYMPH NODE DISSECTION Left 08/16/2020   Procedure: LEFT TARGETED AXILLARY LYMPH NODE DISSECTION;  Surgeon: [name], MD;  Location: [facility];  Service: General;  Laterality: Left;   BREAST LUMPECTOMY WITH RADIOACTIVE SEED AND AXILLARY LYMPH NODE DISSECTION Left 08/16/2020   Procedure: LEFT BREAST LUMPECTOMY WITH RADIOACTIVE SEED;  Surgeon: [name], MD;  Location: [facility];  Service: General;  Laterality: Left;  PEC BLOCK   BREAST SURGERY Left 06/2020   breast bx    CERVICAL BIOPSY  W/ LOOP ELECTRODE EXCISION  12/2017   CESAREAN SECTION     CHOLECYSTECTOMY N/A 01/09/2021   Procedure: LAPAROSCOPIC CHOLECYSTECTOMY WITH INTRAOPERATIVE CHOLANGIOGRAM;  Surgeon: Donnie Mesa, MD;  [facility];   Service: General;  Laterality: N/A;   DILATION AND CURETTAGE OF UTERUS     ERCP N/A 01/10/2021   Procedure: ENDOSCOPIC RETROGRADE CHOLANGIOPANCREATOGRAPHY (ERCP);  Surgeon: Ladene Artist, MD;  [facility];  Service: Endoscopy;  Laterality: N/A;   PORTACATH PLACEMENT  09/12/2020   Procedure: INSERTION PORT-A-CATH WITH ULTRASOUND;  Surgeon: [name], MD;  [facility];  Service: General;;   SPHINCTEROTOMY  01/10/2021   Procedure: Joan Mayans;  Surgeon: Ladene Artist, MD;  [facility];  Service: Endoscopy;;  balloon sweep   WISDOM TOOTH EXTRACTION  2020    There were no vitals filed for this visit.   Subjective Assessment - 09/13/21 0849     Subjective My left breast is a bit swollen.  Not wearing a compression bra.    Pertinent History Patient was diagnosed on 07/06/2020 with left grade II invasive ductal carcinoma breast cancer. Patient reports she underwent a left lumpectomy and sentinel node biopsy (1/2 nodes positive) on 08/16/2020. It is ER/PR positive and HER2 negative with a Ki67 of 15%. She has a positive axillary lymph node. Radiation completed end of June.    Patient Stated Goals what to do for the swelling    Currently in Pain? Yes                [facility] PT Assessment -  09/13/21 0001       Assessment   Medical Diagnosis s/p left lumpectomy and SLNB    Referring Provider (PT) Shona Simpson, PA-C    Onset Date/Surgical Date 08/16/20    Hand Dominance Right      Precautions   Precaution Comments lymphedema left      Restrictions   Weight Bearing Restrictions No      Balance Screen   Has the patient fallen in the past 6 months No    Has the patient had a decrease in activity level because of a fear of falling?  No    Is the patient reluctant to leave their home because of a fear of falling?  No      Home Ecologist residence    Living Arrangements Spouse/significant other    Available Help at  Discharge Family      Prior Function   Level of [place] Unemployed    Leisure She is walking 30 min/day      Cognition   Overall Cognitive Status Within Functional Limits for tasks assessed      Observation/Other Assessments   Observations enlarged pores medial breast      Palpation   Palpation comment mild fibrosis medial breast                           [facility] Adult PT Treatment/Exercise - 09/13/21 0001       Manual Therapy   Manual Therapy Manual Lymphatic Drainage (MLD);Edema management    Edema Management gave pt small dotted foam rectangle and chip pack rectangle and script for bras at [employer].    Manual Lymphatic Drainage (MLD) education on treatment for breast lymphedema and education on self MLD for the left breast.  With pt permission: PT performed all steps with vcs and hand over hand cueing.  Short neck, bil axillary and left inguinal nodes, both pathways, then left breast medial focus and then reversing all steps.  Noted 'optional' on handout for lateral trunk work                     PT Education - 09/13/21 0930     Education Details self MLD left breast, use of compression bra and foam,    Person(s) Educated Patient    Methods Explanation;Demonstration;Tactile cues;Verbal cues;Handout    Comprehension Verbalized understanding;Returned demonstration;Verbal cues required;Tactile cues required;Need further instruction                 PT Long Term Goals - 09/13/21 0933       PT LONG TERM GOAL #1   Title Pt will demonstrate ind self care for mild breast lymphedema including compression bra, foam,and self MLD    Time 3    Period Weeks    Status New      PT LONG TERM GOAL #2   Title na      PT LONG TERM GOAL #3   Title na      PT LONG TERM GOAL #4   Title na      PT LONG TERM GOAL #5   Title na                   Plan - 09/13/21 0931     Clinical Impression Statement Pt  presents with mild left breast medial fibrosis and lymphedema post radiation.  Pt will obtain  compression bra with use of foam and will perform self Massage x 2 weeks and then follow up as status is very mild.  Pt feels comfortable with POC and self massage upon leaving.    Stability/Clinical Decision Making Stable/Uncomplicated    Clinical Decision Making Low    Rehab Potential Excellent    PT Frequency --   2 visits   PT Treatment/Interventions ADLs/Self Care Home Management;Therapeutic exercise;Patient/family education;Iontophoresis 79m/ml Dexamethasone;Manual techniques;Joint Manipulations;Passive range of motion;Taping    PT Next Visit Plan how was MLD, get bra? how is foam? review self MLD/perform with any more appts as needed, Cont every 3 month L-Dex screens for up to 2 years from her SLNB.    Consulted and Agree with Plan of Care Patient             Patient will benefit from skilled therapeutic intervention in order to improve the following deficits and impairments:  Decreased skin integrity, Increased edema  Visit Diagnosis: Aftercare following surgery for neoplasm  Disorder of the skin and subcutaneous tissue related to radiation, unspecified     Problem List Patient Active Problem List   Diagnosis Date Noted   Genetic testing 08/01/2020   Family history of breast cancer 07/26/2020   Family history of prostate cancer 07/26/2020   Family history of colon cancer 07/26/2020   Malignant neoplasm of lower-outer quadrant of left breast of female, estrogen [place] 07/20/2020    TStark Bray PT 09/13/2021, 9:34 AM  [email or web address] [address]Phone: [phone]  Fax:  [phone] Name: RLALITHA ILYASMRN: [id number] of Birth: [birth year]

## 2021-10-03 ENCOUNTER — Encounter: Payer: Self-pay | Admitting: Rehabilitation

## 2021-10-10 ENCOUNTER — Encounter: Payer: Self-pay | Admitting: Rehabilitation

## 2021-10-25 ENCOUNTER — Other Ambulatory Visit: Payer: Self-pay | Admitting: Hematology and Oncology

## 2021-10-25 ENCOUNTER — Ambulatory Visit
Admission: RE | Admit: 2021-10-25 | Discharge: 2021-10-25 | Disposition: A | Discharge status: Home / Self Care | Payer: 59 | Source: Ambulatory Visit | Attending: Adult Health | Admitting: Adult Health

## 2021-10-25 ENCOUNTER — Other Ambulatory Visit: Payer: 59

## 2021-10-25 ENCOUNTER — Other Ambulatory Visit: Payer: Self-pay | Admitting: Adult Health

## 2021-10-25 DIAGNOSIS — C50512 Malignant neoplasm of lower-outer quadrant of left female breast: Secondary | ICD-10-CM

## 2021-10-25 DIAGNOSIS — Z170 Estrogen receptor positive status [ER+]: Secondary | ICD-10-CM

## 2021-10-25 DIAGNOSIS — R921 Mammographic calcification found on diagnostic imaging of breast: Secondary | ICD-10-CM

## 2021-10-25 DIAGNOSIS — F4322 Adjustment disorder with anxiety: Secondary | ICD-10-CM

## 2021-10-25 HISTORY — DX: Personal history of antineoplastic chemotherapy: Z92.21

## 2021-10-25 HISTORY — DX: Personal history of irradiation: Z92.3

## 2021-10-25 HISTORY — PX: BREAST LUMPECTOMY: SHX2

## 2021-10-25 IMAGING — MG DIGITAL DIAGNOSTIC BILAT W/ TOMO W/ CAD
8 of 13 series · 8 of 33 positions shown · non-contrast
Comparison: Previous exam(s).

CLINICAL DATA: 55-year-old female for annual follow-up. History of
LEFT breast cancer and lumpectomy on [DATE].

EXAM:
DIGITAL DIAGNOSTIC BILATERAL MAMMOGRAM WITH CAD AND TOMO

[L CC (1 of 2)]
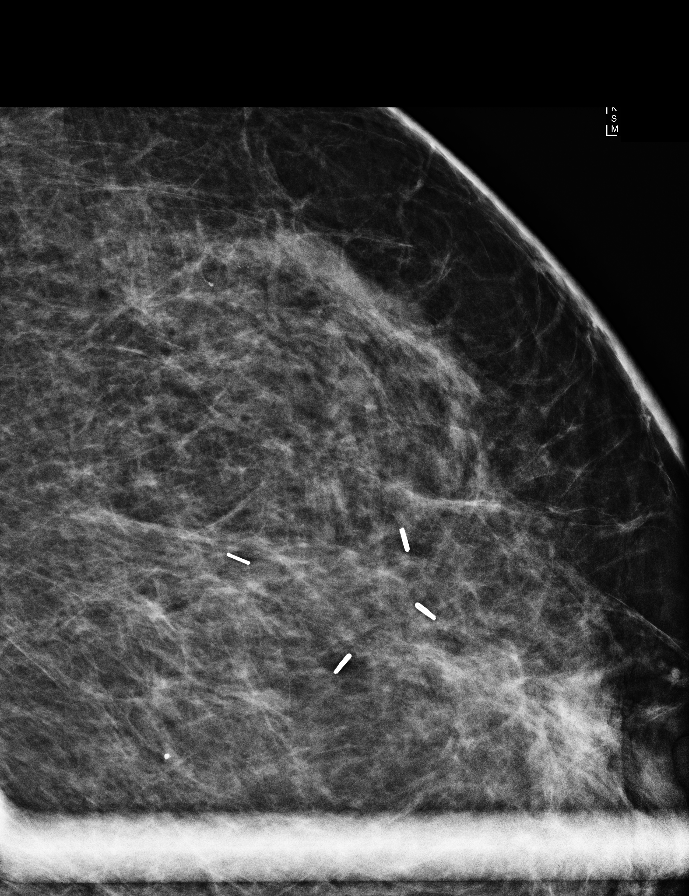

[L CC (2 of 2)]
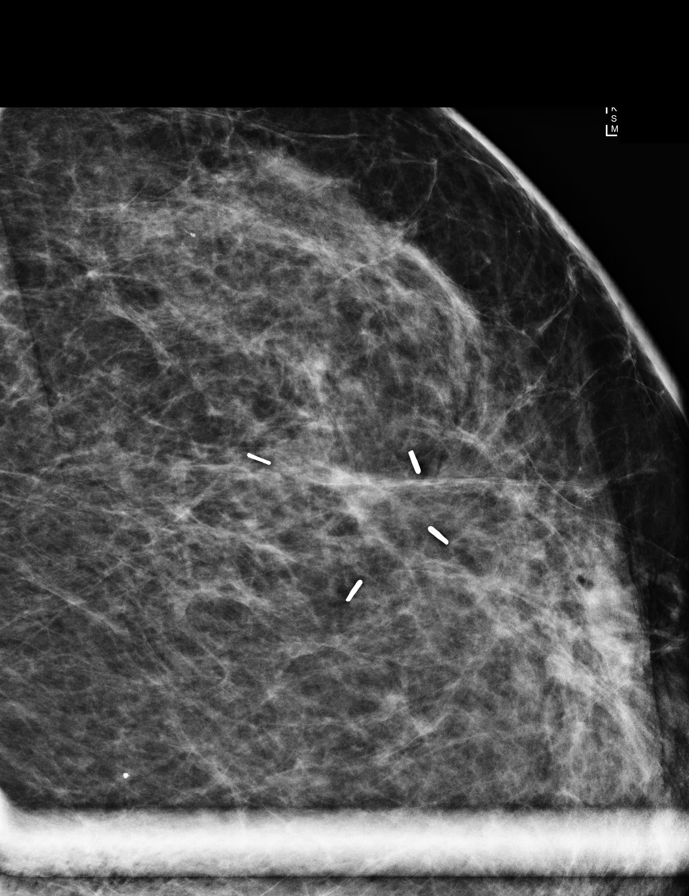

[L ML]
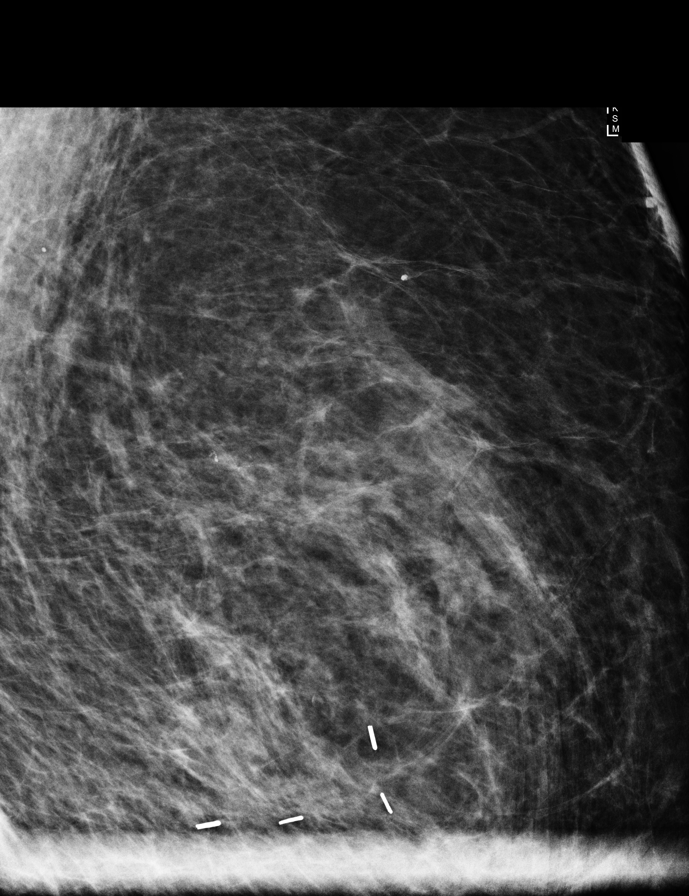

[R CC synth-2D]
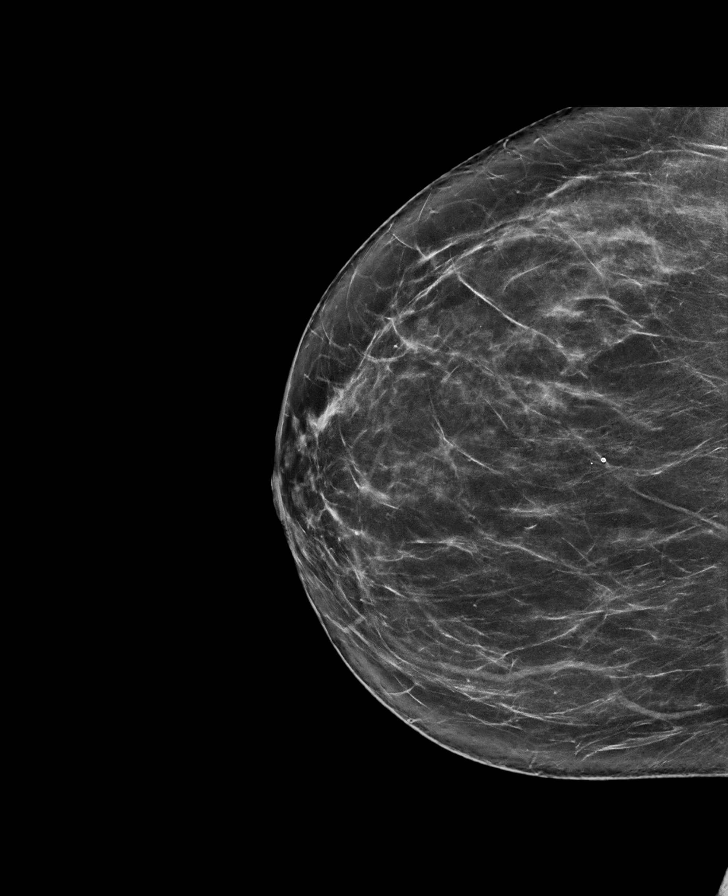

[L MLO synth-2D]
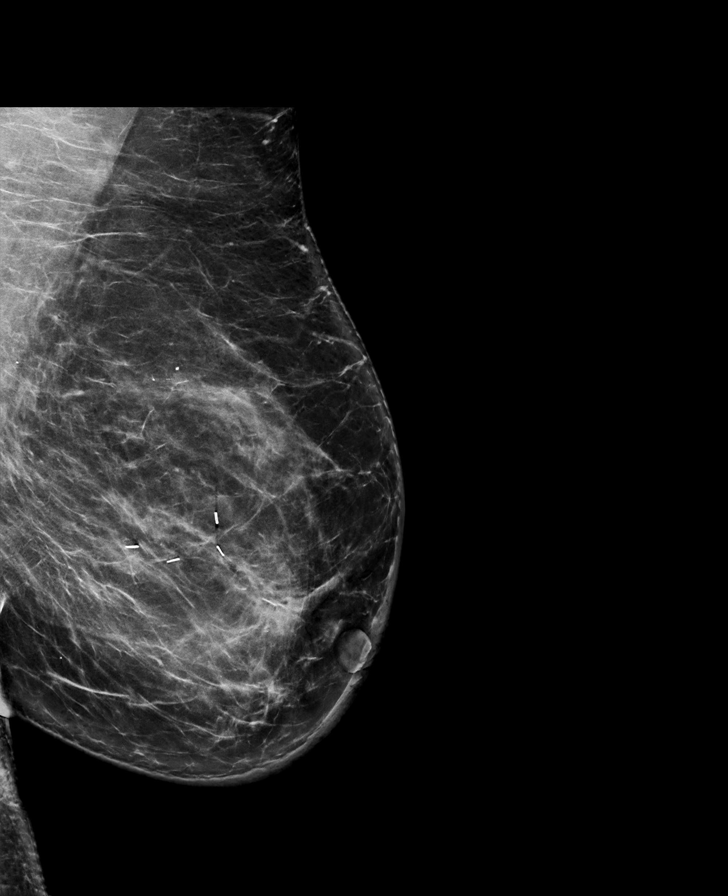

[L ML synth-2D]
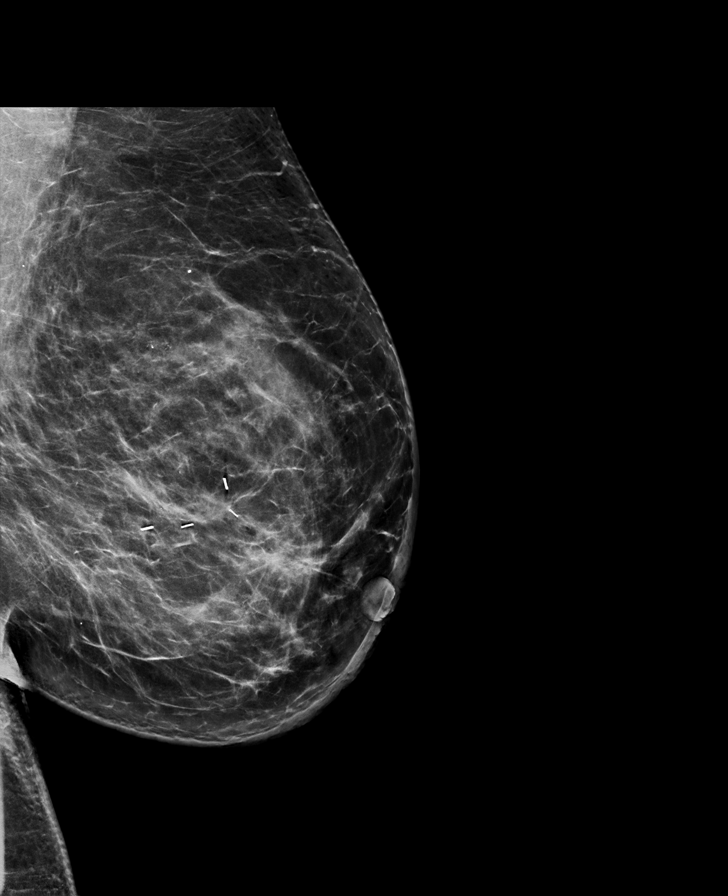

[L CC synth-2D]
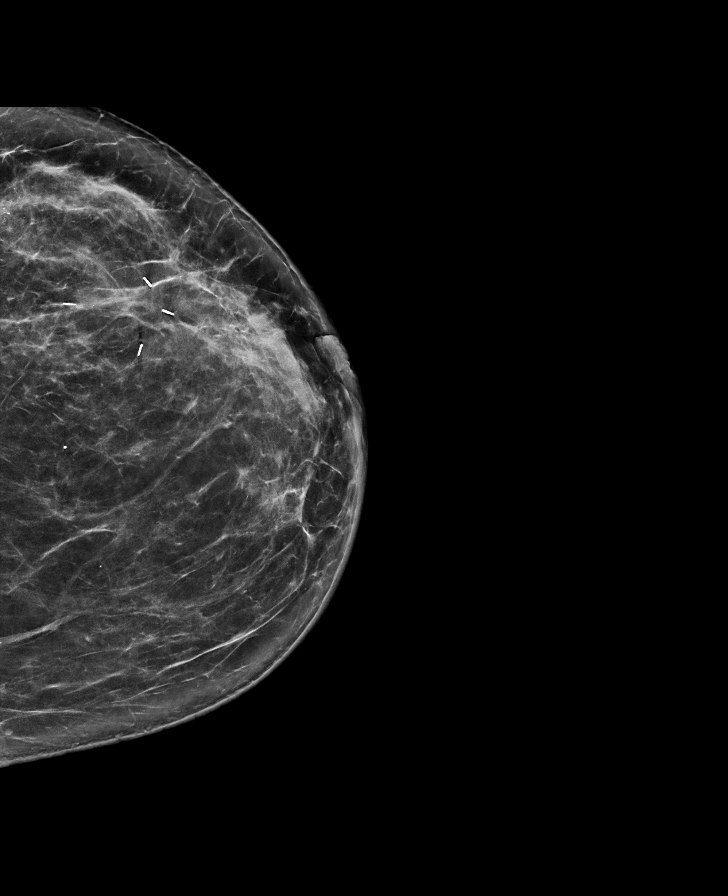

[R MLO synth-2D]
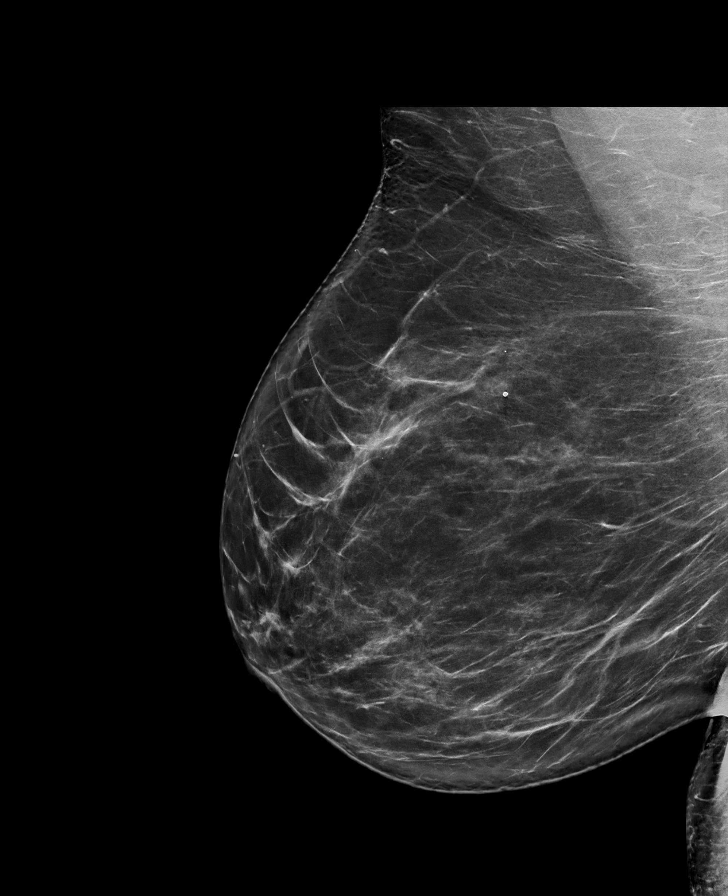

[8 of 33 positions shown; findings below may reference images not displayed]

ACR Breast Density Category c: The breast tissue is heterogeneously
dense, which may obscure small masses.
FINDINGS: Full field views of both breast and magnification views of the LEFT
breast are performed.

A new 0.8 cm group of slightly heterogeneous calcifications is
identified within the posterior UPPER-OUTER LEFT breast. Some of
these calcifications are in a linear orientation.

Lumpectomy changes within the OUTER LEFT breast are noted.

No suspicious mammographic findings are noted within the RIGHT
breast.

Mammographic images were processed with CAD.
IMPRESSION: 1. Indeterminate 0.8 cm group of UPPER-OUTER LEFT breast
calcifications. Tissue sampling is recommended.
2. No suspicious mammographic findings within the RIGHT breast.
3. LEFT lumpectomy changes.

RECOMMENDATION:
3D/stereotactic guided LEFT breast biopsy, which will be scheduled.

I have discussed the findings and recommendations with the patient.
If applicable, a reminder letter will be sent to the patient
regarding the next appointment.

BI-RADS CATEGORY  4: Suspicious.

## 2021-10-31 ENCOUNTER — Ambulatory Visit
Admission: RE | Admit: 2021-10-31 | Discharge: 2021-10-31 | Disposition: A | Discharge status: Home / Self Care | Payer: 59 | Source: Ambulatory Visit | Attending: Adult Health | Admitting: Adult Health

## 2021-10-31 DIAGNOSIS — R921 Mammographic calcification found on diagnostic imaging of breast: Secondary | ICD-10-CM

## 2021-10-31 DIAGNOSIS — Z170 Estrogen receptor positive status [ER+]: Secondary | ICD-10-CM

## 2021-10-31 HISTORY — PX: BREAST BIOPSY: SHX20

## 2021-10-31 IMAGING — MG MM BREAST LOCALIZATION CLIP
4 series · 4 of 12 positions shown · non-contrast
Comparison: Previous exam(s).

CLINICAL DATA: Post biopsy mammogram of the left breast placement.

EXAM:
3D DIAGNOSTIC LEFT MAMMOGRAM POST STEREOTACTIC BIOPSY

[L CC synth-2D]
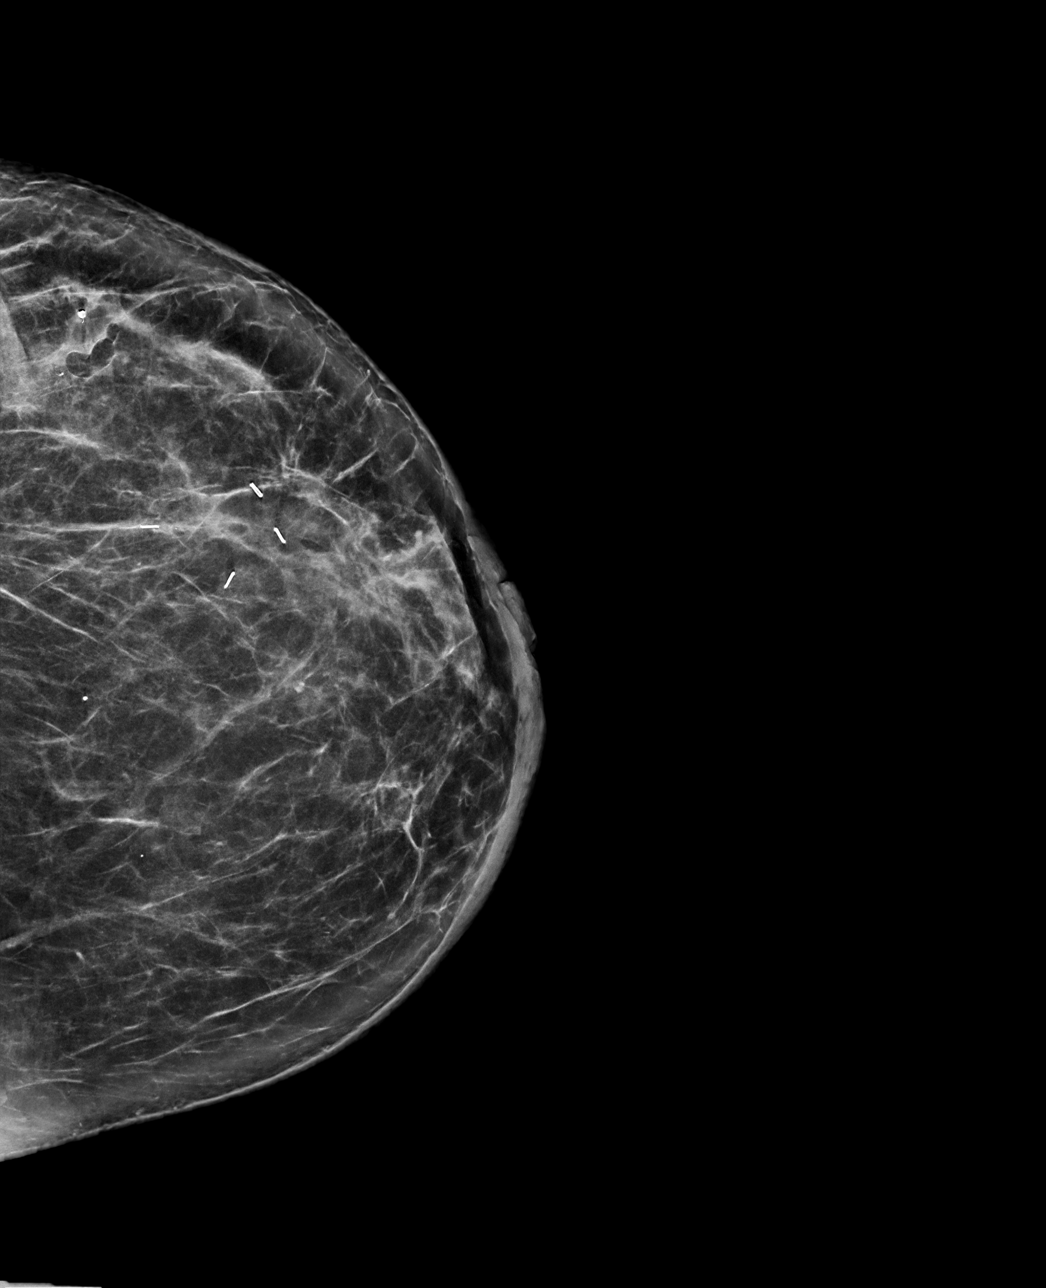

[L LM synth-2D]
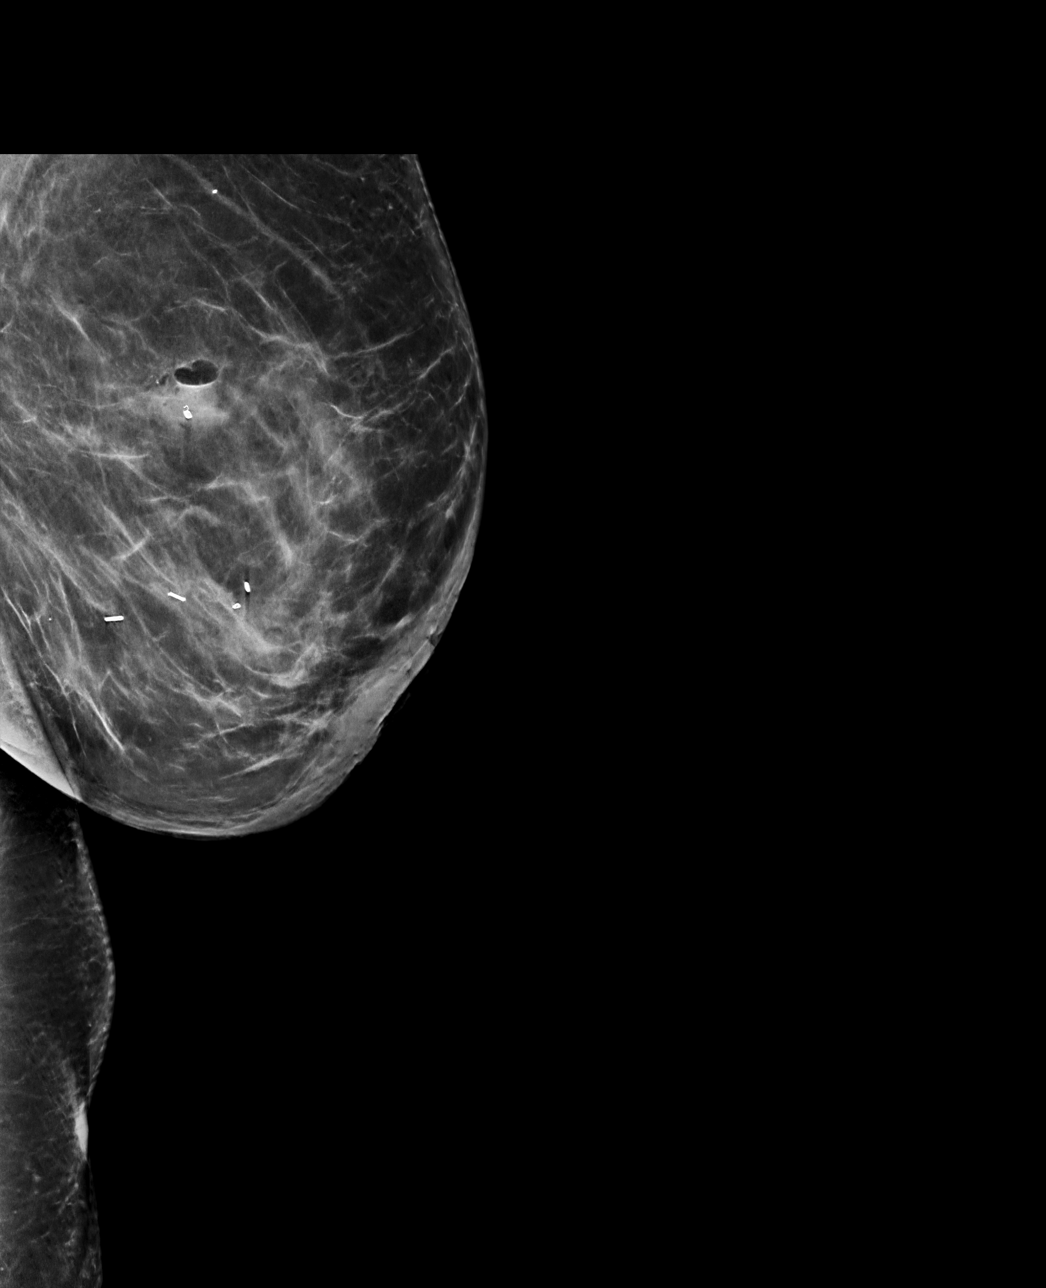

[L CC tomo · tomo slice 43/85.0]
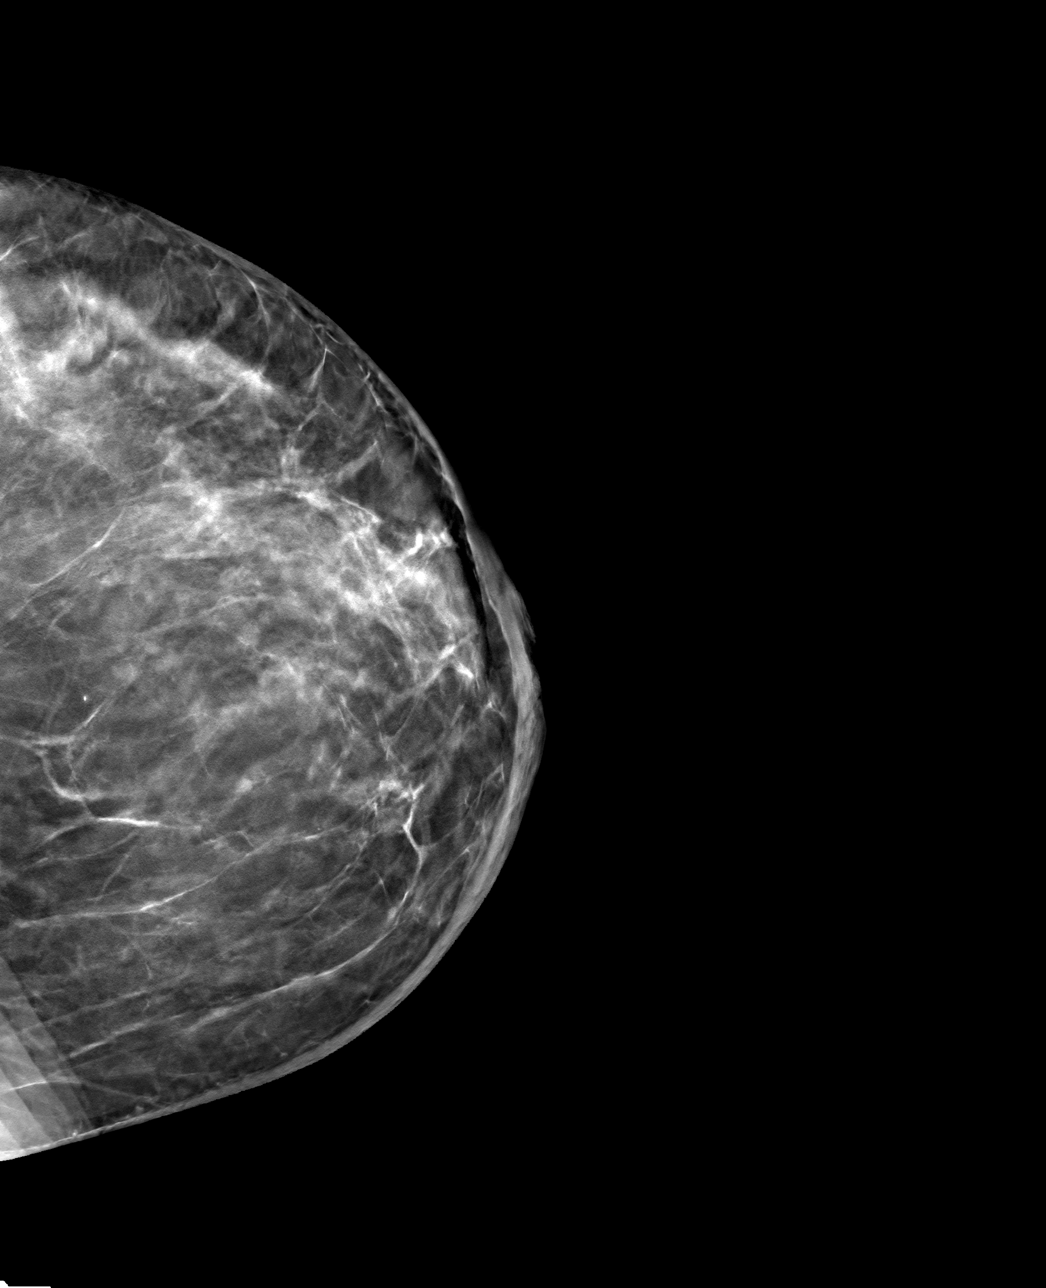

[L LM tomo · tomo slice 54/107.0]
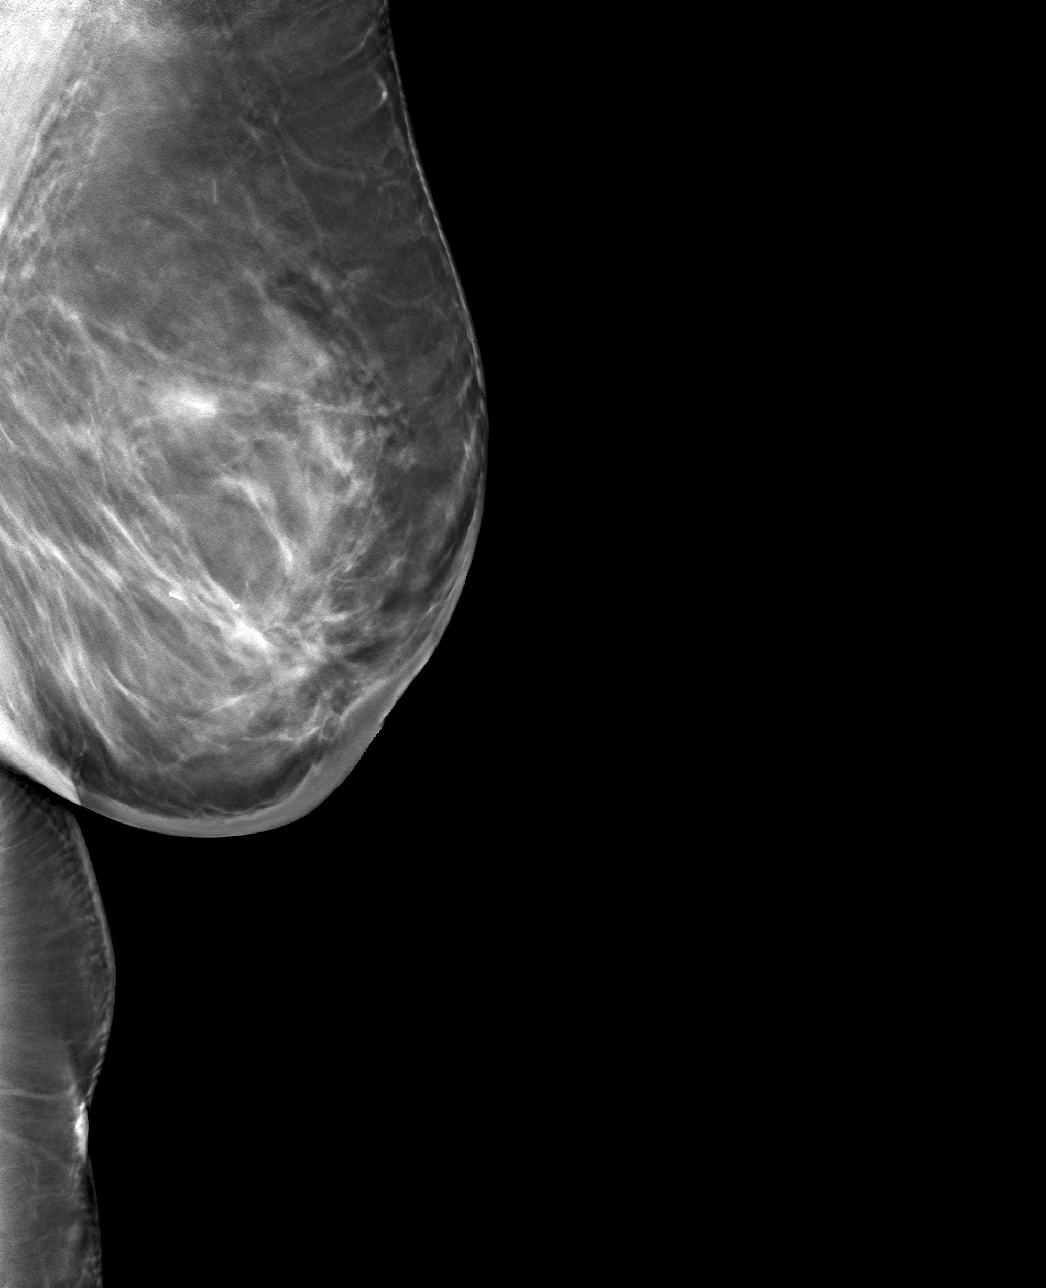

[4 of 12 positions shown; findings below may reference images not displayed]

FINDINGS: 3D Mammographic images were obtained following stereotactic guided
biopsy of calcifications in the upper-outer left breast. The biopsy
marking clip is about 7-8 mm lateral to the biopsied
calcifications.
IMPRESSION: The coil shaped biopsy marking clip is approximately 7-8 mm lateral
to the biopsied calcifications in the upper-outer quadrant of the
left breast.

Final Assessment: Post Procedure Mammograms for Marker Placement

## 2021-10-31 IMAGING — MG MM BREAST BX W LOC DEV 1ST LESION IMAGE BX SPEC STEREO GUIDE*L*
8 of 12 series · 8 of 24 positions shown · non-contrast
Comparison: Previous exams.
COMPARISON: Previous exams.

Addendum:
CLINICAL DATA: 55-year-old female presenting for stereotactic
biopsy of calcifications in the upper-outer left breast.

EXAM:
LEFT BREAST STEREOTACTIC CORE NEEDLE BIOPSY

[L (1 of 6)]
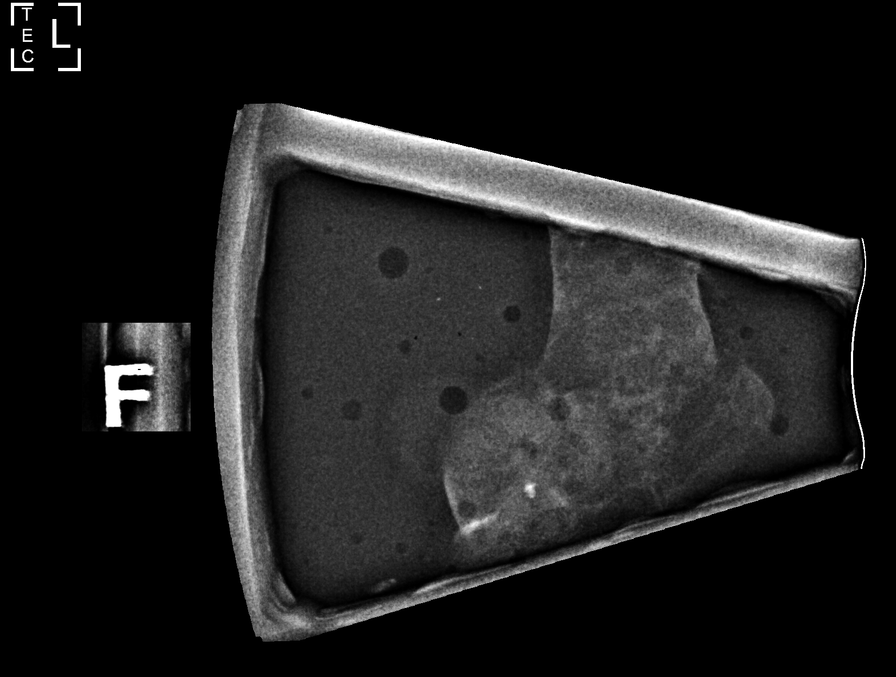

[L (2 of 6)]
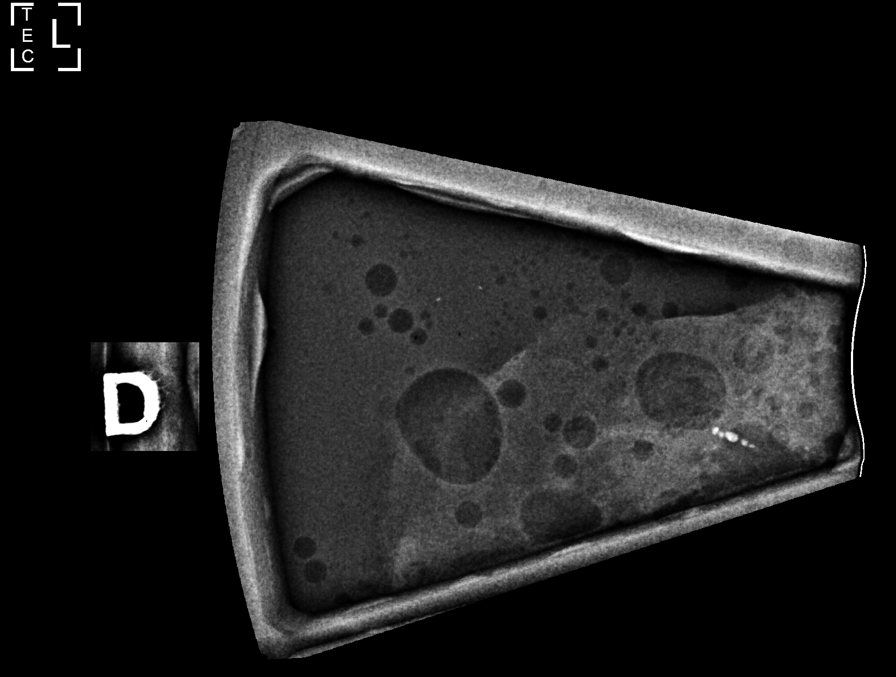

[L (3 of 6)]
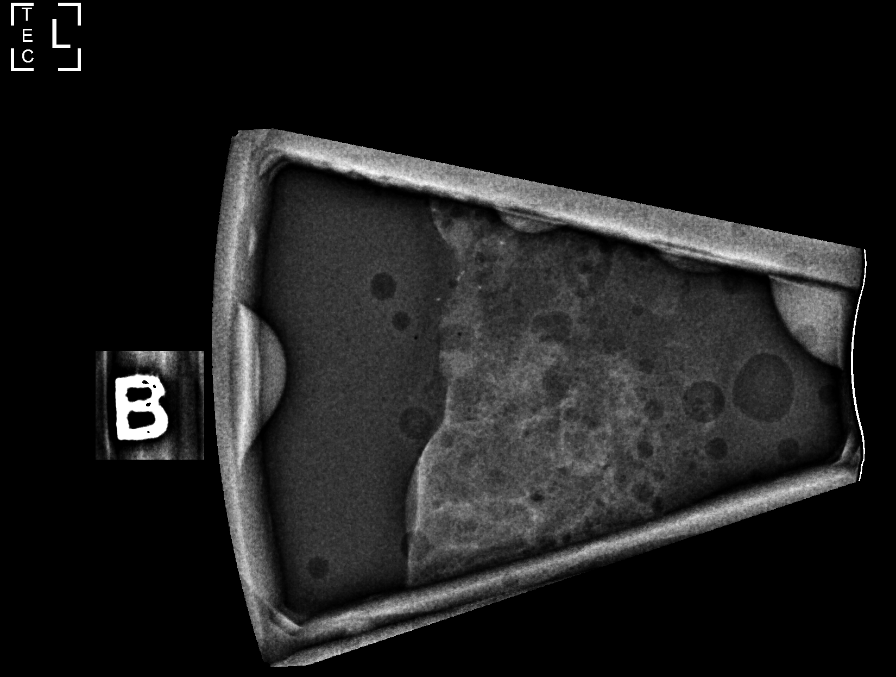

[L (4 of 6)]
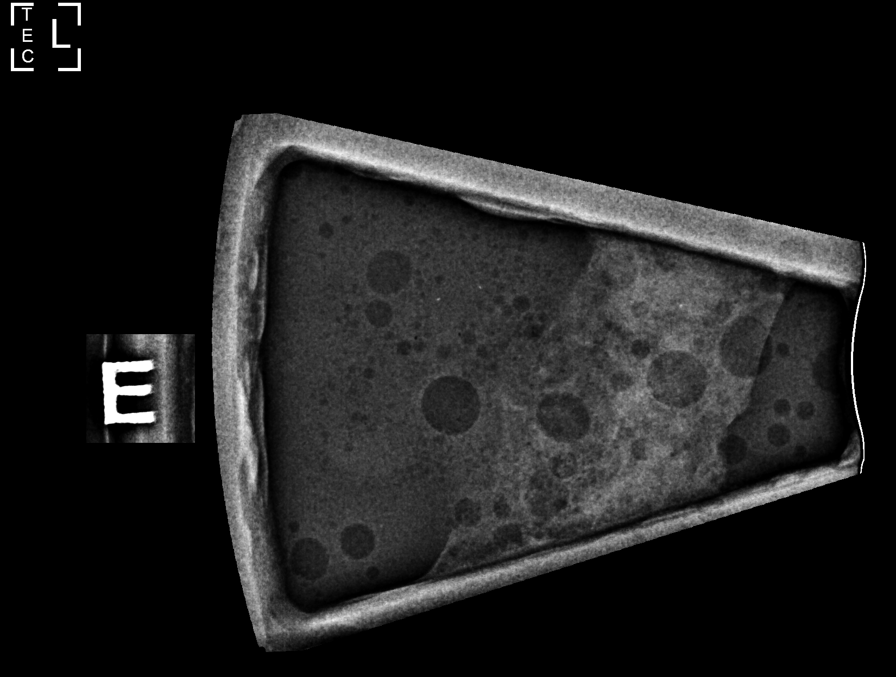

[L (5 of 6)]
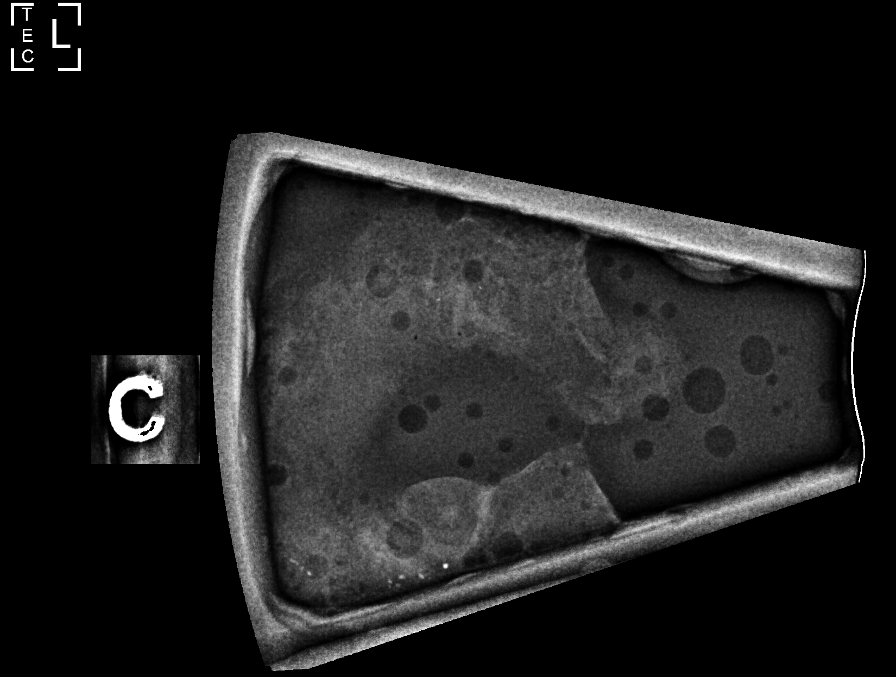

[L (6 of 6)]
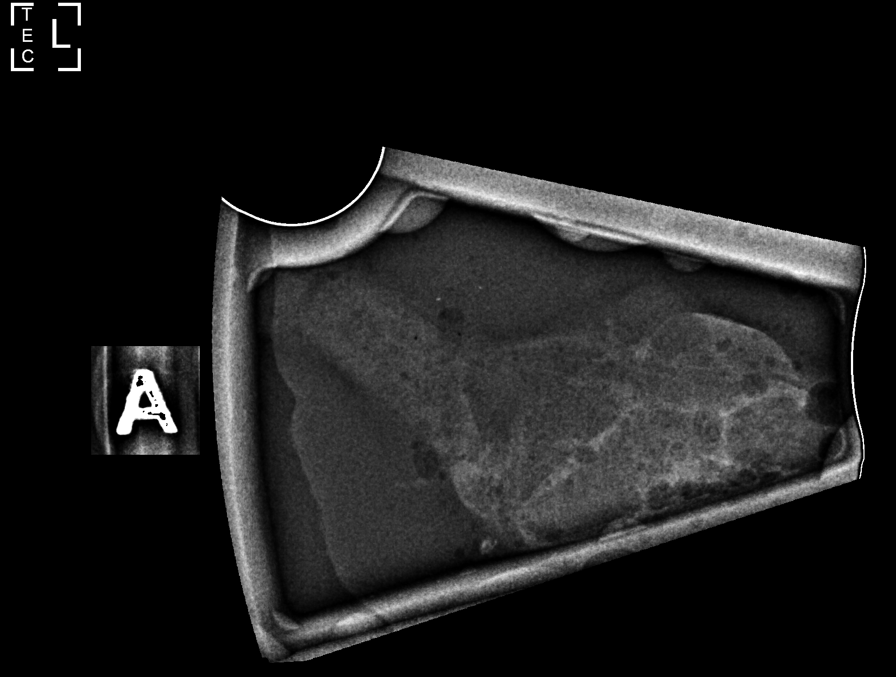

[L LM (1 of 2)]
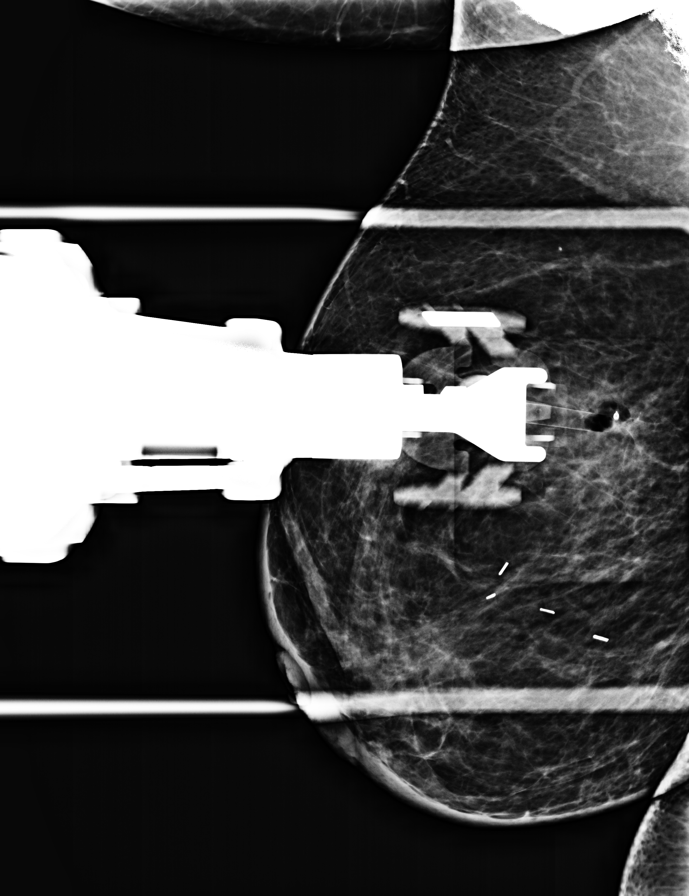

[L LM (2 of 2)]
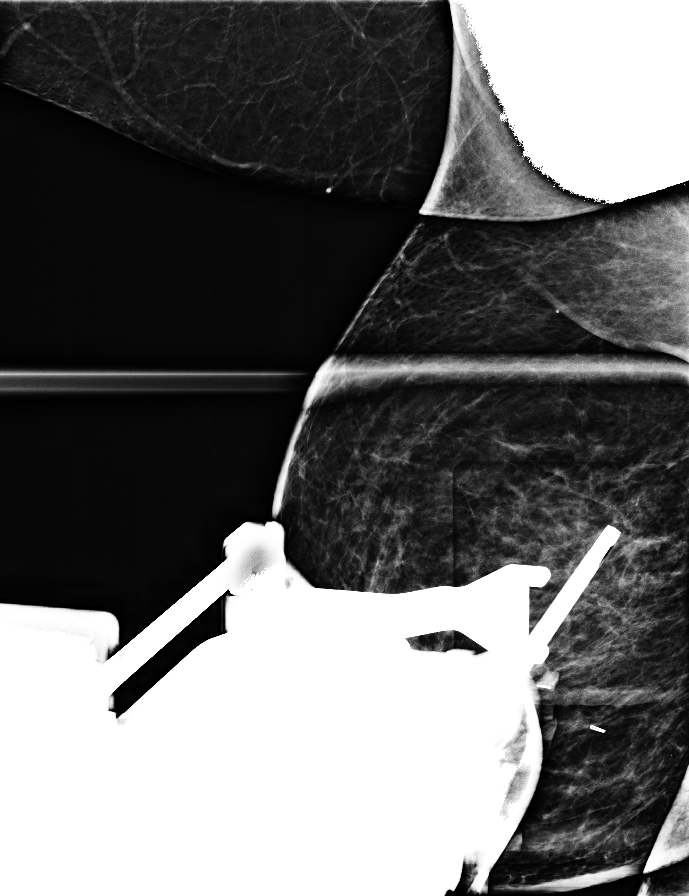

[8 of 24 positions shown; findings below may reference images not displayed]



Using sterile technique and 1% Lidocaine as local anesthetic, under
stereotactic guidance, a 9 gauge vacuum assisted device was used to
perform core needle biopsy of calcifications in the upper outer
quadrant of the left breast using a lateral approach. Specimen
radiograph was performed showing calcifications in multiple core
samples. Specimens with calcifications are identified for pathology.

Lesion quadrant: Upper outer quadrant

At the conclusion of the procedure, the coil shaped tissue marker
clip was deployed into the biopsy cavity. Follow-up 2-view mammogram
was performed and dictated separately.
IMPRESSION: Stereotactic-guided biopsy of calcifications in the upper-outer left
breast. No apparent complications.

ADDENDUM:
Pathology revealed FIBROCYSTIC CHANGES WITH CALCIFICATIONS of the
LEFT breast, upper outer quadrant, (coil clip). This was found to be
concordant by Dr. GENDRO.

Pathology results were discussed with the patient by telephone. The
patient reported doing well after the biopsy with tenderness at the
site. Post biopsy instructions and care were reviewed and questions
were answered. The patient was encouraged to call The [REDACTED] for any additional concerns. My direct phone
number was provided.

The patient was instructed to return for annual diagnostic
mammography in [DATE].

Pathology results reported by GENDRO, RN on [DATE].



Using sterile technique and 1% Lidocaine as local anesthetic, under
stereotactic guidance, a 9 gauge vacuum assisted device was used to
perform core needle biopsy of calcifications in the upper outer
quadrant of the left breast using a lateral approach. Specimen
radiograph was performed showing calcifications in multiple core
samples. Specimens with calcifications are identified for pathology.

Lesion quadrant: Upper outer quadrant

At the conclusion of the procedure, the coil shaped tissue marker
clip was deployed into the biopsy cavity. Follow-up 2-view mammogram
was performed and dictated separately.
IMPRESSION: Stereotactic-guided biopsy of calcifications in the upper-outer left
breast. No apparent complications.

## 2021-11-07 ENCOUNTER — Ambulatory Visit: Payer: 59 | Admitting: Adult Health

## 2021-11-08 ENCOUNTER — Ambulatory Visit
Admission: RE | Admit: 2021-11-08 | Discharge: 2021-11-08 | Disposition: A | Discharge status: Home / Self Care | Payer: 59 | Source: Ambulatory Visit | Attending: Adult Health | Admitting: Adult Health

## 2021-11-08 DIAGNOSIS — E2839 Other primary ovarian failure: Secondary | ICD-10-CM

## 2021-11-09 ENCOUNTER — Telehealth: Payer: Self-pay

## 2021-11-09 NOTE — Telephone Encounter (Signed)
Called pt, per Lindsay Hampton, bone density is normal.  Pt verbalized understanding and thanks

## 2021-11-30 ENCOUNTER — Inpatient Hospital Stay: Payer: 59 | Attending: Hematology and Oncology | Admitting: Adult Health

## 2021-11-30 ENCOUNTER — Ambulatory Visit
Admission: RE | Admit: 2021-11-30 | Discharge: 2021-11-30 | Disposition: A | Discharge status: Home / Self Care | Payer: 59 | Source: Ambulatory Visit | Attending: Adult Health | Admitting: Adult Health

## 2021-11-30 ENCOUNTER — Telehealth: Payer: Self-pay | Admitting: *Deleted

## 2021-11-30 ENCOUNTER — Other Ambulatory Visit: Payer: Self-pay

## 2021-11-30 ENCOUNTER — Encounter: Payer: Self-pay | Admitting: Adult Health

## 2021-11-30 VITALS — BP 147/87 | HR 103 | Temp 97.7°F | Resp 18 | Wt 240.3 lb

## 2021-11-30 DIAGNOSIS — C50512 Malignant neoplasm of lower-outer quadrant of left female breast: Secondary | ICD-10-CM

## 2021-11-30 DIAGNOSIS — Z800 Family history of malignant neoplasm of digestive organs: Secondary | ICD-10-CM | POA: Insufficient documentation

## 2021-11-30 DIAGNOSIS — Z170 Estrogen receptor positive status [ER+]: Secondary | ICD-10-CM | POA: Diagnosis not present

## 2021-11-30 DIAGNOSIS — Z8042 Family history of malignant neoplasm of prostate: Secondary | ICD-10-CM | POA: Diagnosis not present

## 2021-11-30 DIAGNOSIS — Z803 Family history of malignant neoplasm of breast: Secondary | ICD-10-CM | POA: Insufficient documentation

## 2021-11-30 IMAGING — MG MM DIGITAL DIAGNOSTIC UNILAT*L* W/ TOMO W/ CAD
2 series · 3 of 6 positions shown · non-contrast
Comparison: Previous exam(s).

CLINICAL DATA: 55-year-old female with palpable lump at the
patient's LEFT lumpectomy site. History of LEFT breast cancer and
lumpectomy in [C8].

EXAM:
DIGITAL DIAGNOSTIC UNILATERAL LEFT MAMMOGRAM WITH TOMOSYNTHESIS AND
CAD; ULTRASOUND LEFT BREAST LIMITED
TECHNIQUE: Left digital diagnostic mammography and breast tomosynthesis was
performed. The images were evaluated with computer-aided detection.;
Targeted ultrasound examination of the left breast was performed.

[L CC synth-2D]
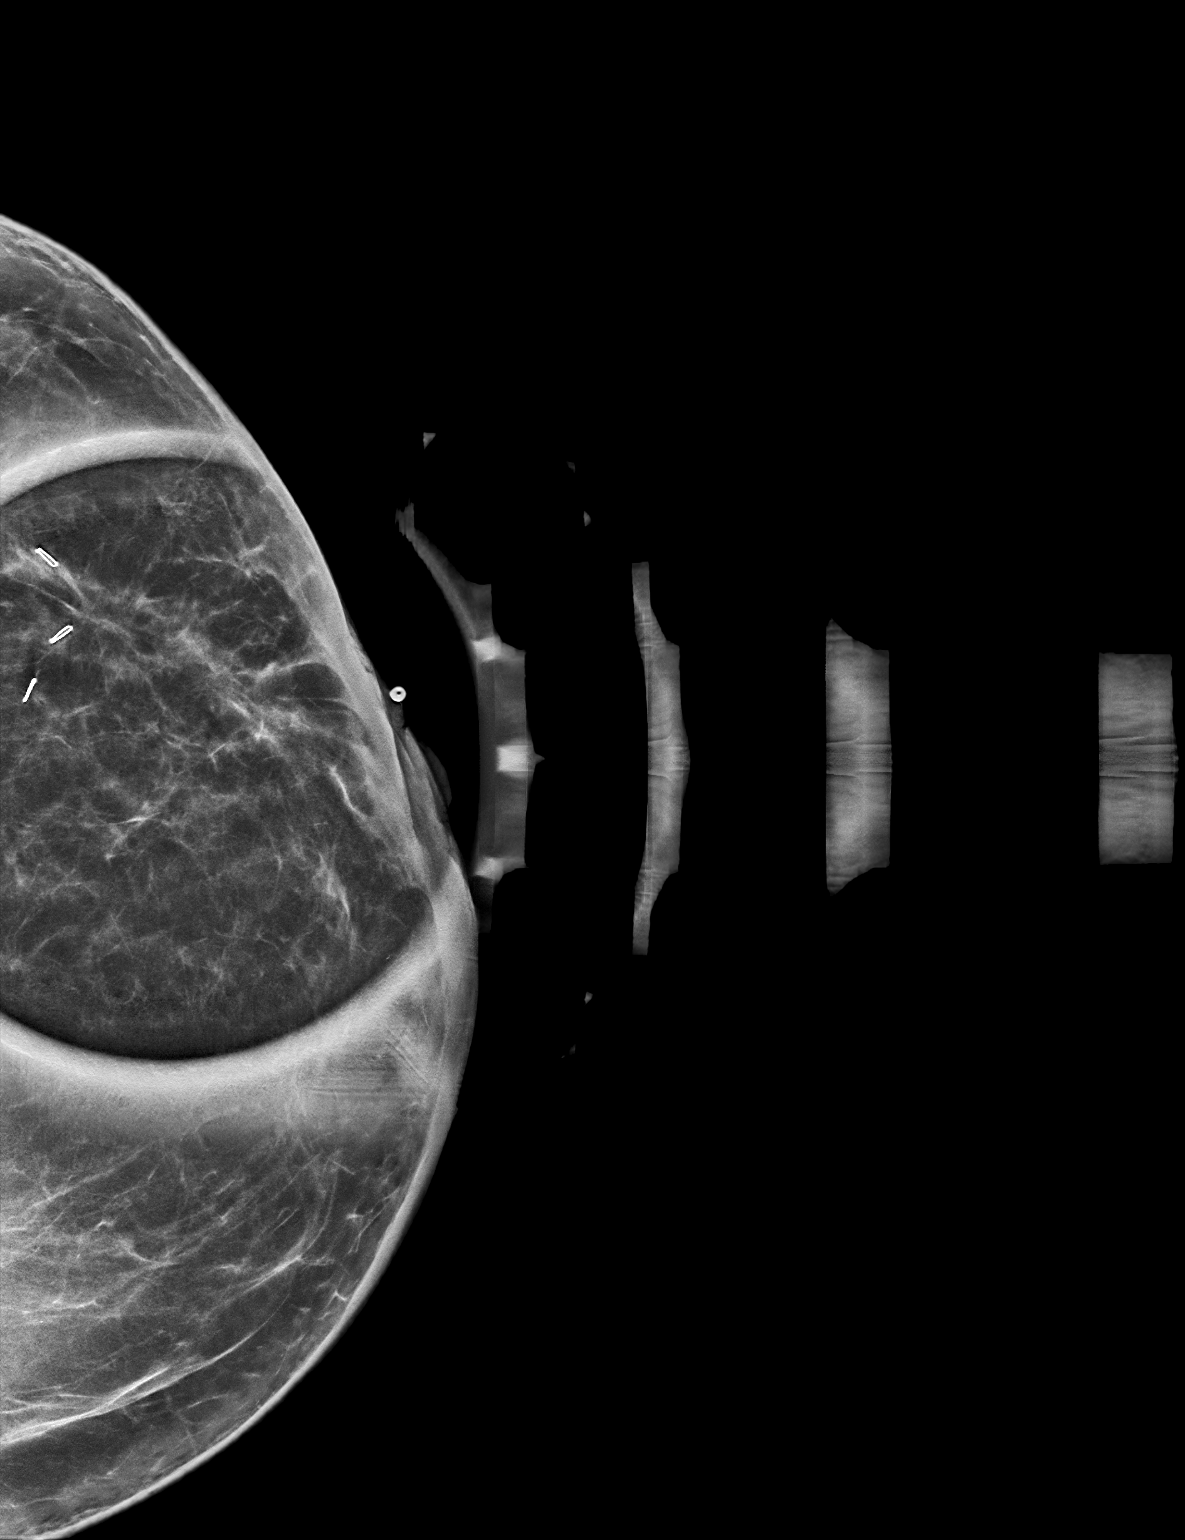

[L CC tomo · 2 of 59 frames shown]
[frame 20/59]
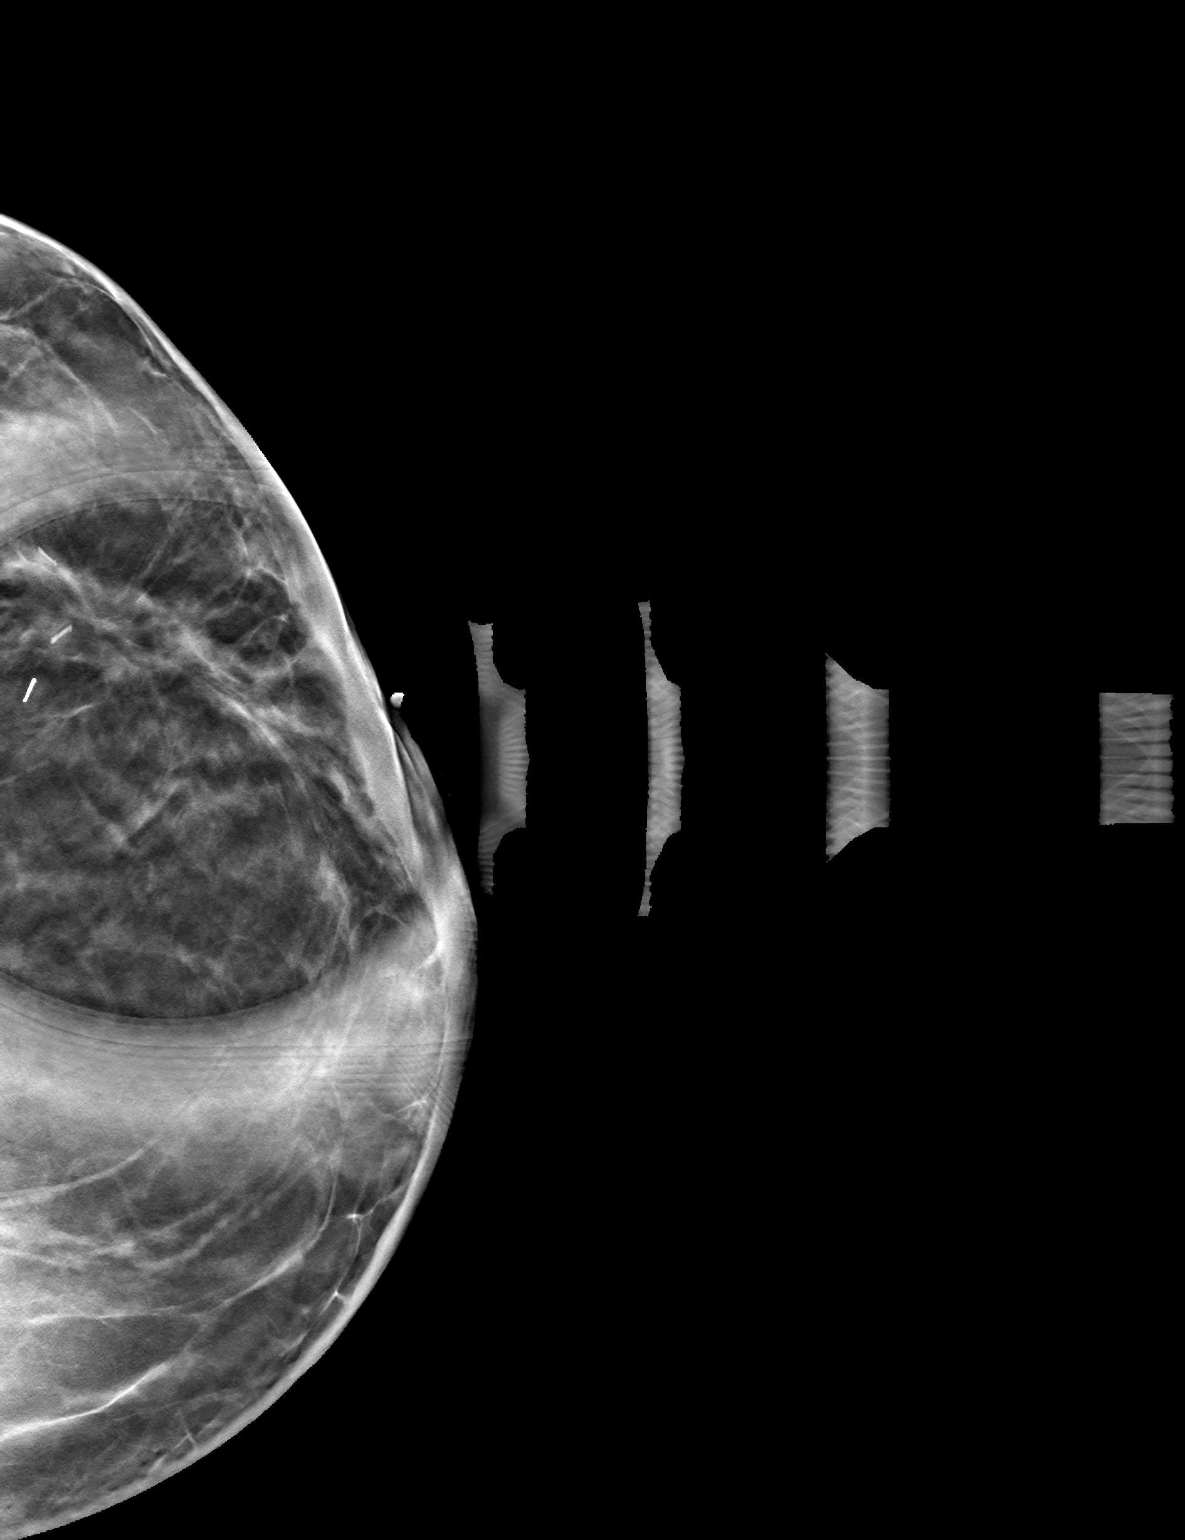
[frame 30/59]
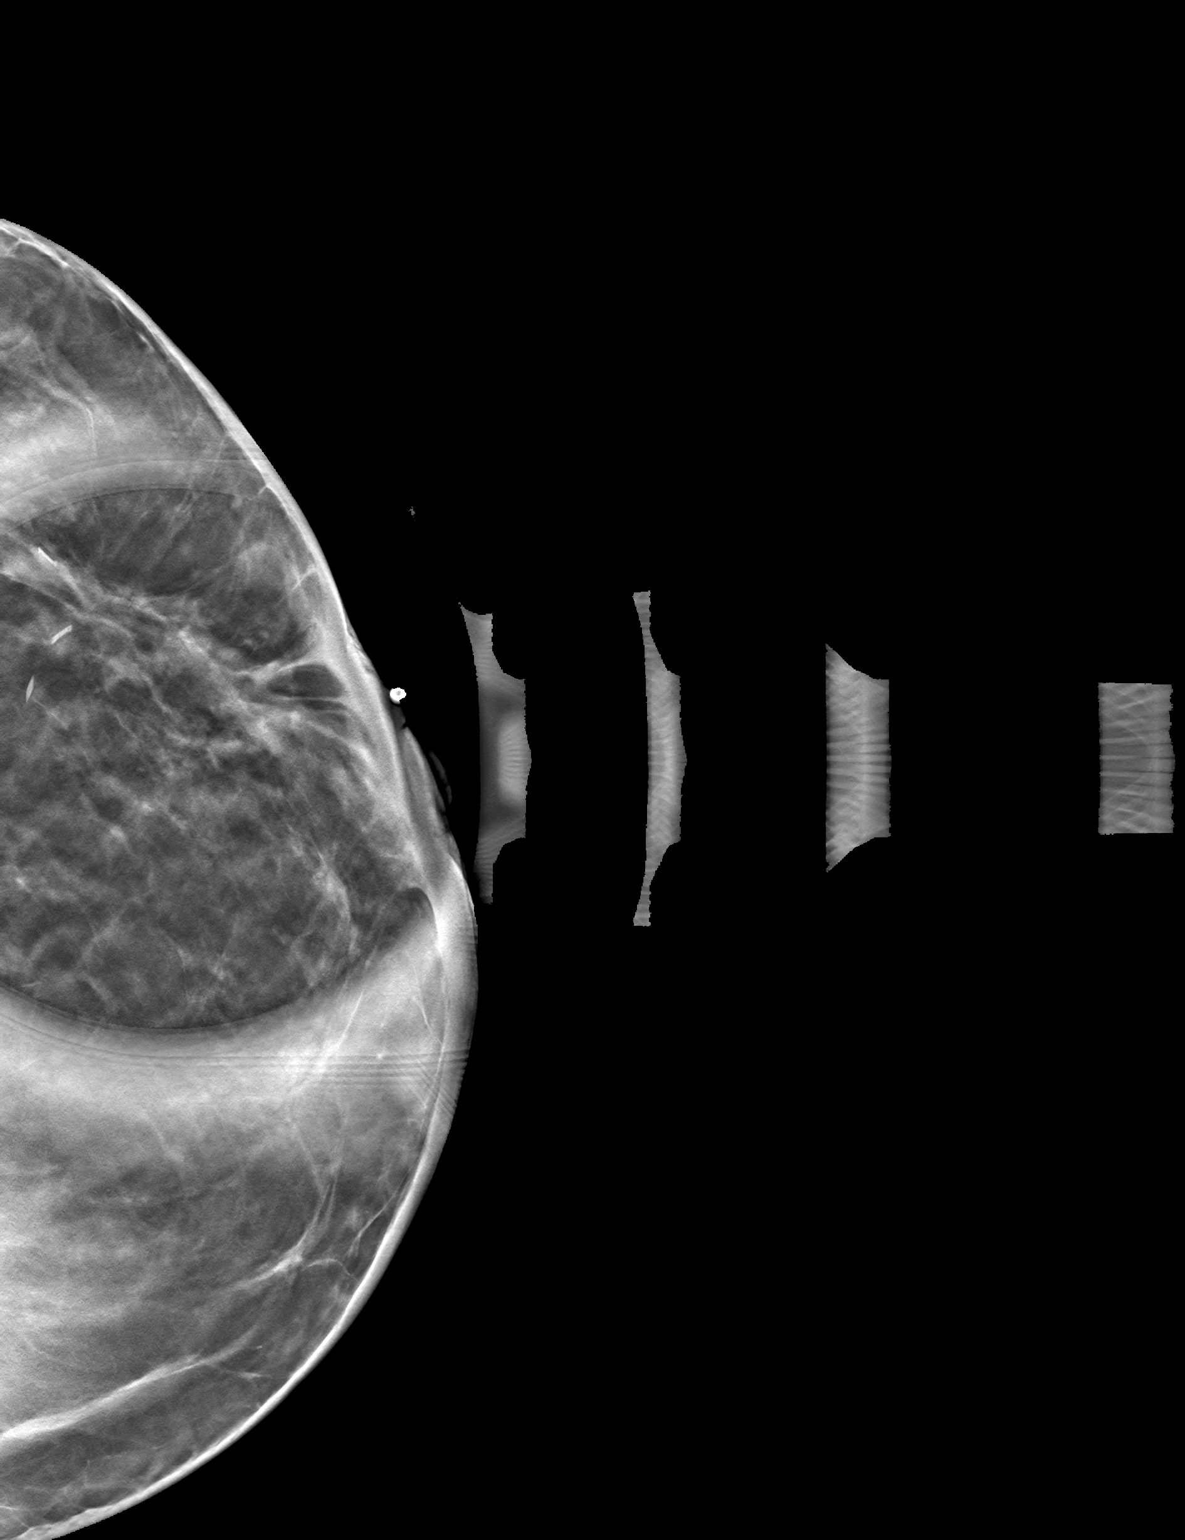

[3 of 6 positions shown; findings below may reference images not displayed]

ACR Breast Density Category b: There are scattered areas of
fibroglandular density.
FINDINGS: A spot tangential view of the LEFT breast demonstrates
lumpectomy/scarring changes within the OUTER LEFT breast. No new or
suspicious findings are noted.

On physical exam, mild thickening is identified along the LOWER
OUTER LEFT areolar edge at the surgical scar.

Targeted ultrasound is performed, showing scarring changes at the 4
o'clock position of the RETROAREOLAR LEFT breast at the site of
patient concern. No suspicious mass, nonsurgical distortion or
worrisome shadowing is identified.
IMPRESSION: 1. Scarring changes in the LOWER OUTER RETROAREOLAR region
corresponding to the patient's palpable abnormality. No suspicious
abnormalities identified.

RECOMMENDATION:
Consider clinical follow-up as indicated. Any further workup should
be based on clinical grounds.

Bilateral diagnostic mammogram in [DATE] to resume annual
mammogram schedule.

I have discussed the findings and recommendations with the patient.
If applicable, a reminder letter will be sent to the patient
regarding the next appointment.

BI-RADS CATEGORY  2: Benign.

## 2021-11-30 NOTE — Telephone Encounter (Signed)
Received call from pt with complaint of left breast thickening x1 day. Denies redness, pain or warmth.  Pt states she is very concerned and requesting in office breast exam for further evaluation.  Appt scheduled, pt verbalized understanding of date and time.

## 2021-11-30 NOTE — Assessment & Plan Note (Signed)
08/16/2020:Left lumpectomy Lindsay Hampton): IDC, grade 2, 2.3cm, with intermediate grade DCIS, clear margins, 1/2 left axillary lymph nodes positive for carcinoma.HER-2 equivocal by IHC (2+), negative by FISH, ER+ 95%, PR+40%, Ki67 15%. T2 N1 M0 stage Ib MammaPrint: High risk (average 10-year risk untreated: 29%, predicted benefit of chemo with hormone therapy at 5 years: 94.6%  Treatment plan: 1.Adjuvant chemotherapy withdose dense Adriamycin and Cytoxan x4 followed by Taxol completed 02/19/21 2.Followed by adjuvant radiation 04/06/21- 05/22/21 3.Followed byadjuvant antiestrogen therapy ------------------------------------------------------------------------------------------------------------------------------------ Anastrozole daily, tolerating well  Lindsay Hampton is here to follow-up about a new nodule in her left lumpectomy site.  I placed orders for diagnostic mammogram and ultrasound of her left breast to be conducted.  She verbalized understanding of the above and my nurse will give her the appointment date and time.  Lindsay Hampton has a follow-up appointment with Dr. Lindi Adie on February 1 and we will follow-up with him about the imaging.

## 2021-11-30 NOTE — Progress Notes (Signed)
[facility] Follow up:    Lindsay Rasmussen, MD [address] [place] [address]   DIAGNOSIS:  Cancer Staging  Malignant neoplasm of lower-outer quadrant of left breast of female, estrogen receptor positive ([place]) Staging form: Breast, AJCC 8th Edition - Clinical stage from 07/26/2020: Stage IIA (cT2, cN1(f), cM0, G2, ER+, PR+, HER2-) - Signed by [name], MD on 07/26/2020 Stage prefix: Initial diagnosis Method of lymph node assessment: Core biopsy Histologic grading system: 3 grade system - Pathologic stage from 08/24/2020: Stage IB (pT2, pN1a, cM0, G2, ER+, PR+, HER2-) - Signed by [name], MD on 08/24/2020 Stage prefix: Initial diagnosis Histologic grading system: 3 grade system   SUMMARY OF ONCOLOGIC HISTORY: Oncology History  Malignant neoplasm of lower-outer quadrant of left breast of female, estrogen receptor positive ([place])  07/20/2020 Initial Diagnosis   Screening mammogram detected left breast calcifications, enlarged left axillary lymph nodes, and an asymmetry in the right breast. Diagnostic mammogram showed in the left breast, a 2.4cm mass with calcifications, 3:30 position, a left axillary lymph node with cortical thickness, and in the right breast, no suspicious masses or calcifications. Left breast biopsy showed invasive and in situ mammary carcinoma in the breast and axilla, grade 2, HER-2 equivocal by IHC (2+), negative by FISH, ER+ 95%, PR+40%, Ki67 15%.    07/26/2020 Cancer Staging   Staging form: Breast, AJCC 8th Edition - Clinical stage from 07/26/2020: Stage IIA (cT2, cN1(f), cM0, G2, ER+, PR+, HER2-) - Signed by [name], MD on 07/26/2020    08/01/2020 Genetic Testing   No pathogenic variants detected in [facility] Multi-Cancer Panel.  The Multi-Cancer Panel offered by [facility] includes sequencing and/or deletion duplication testing of the following 85 genes: AIP, ALK, APC, ATM, AXIN2,BAP1,  BARD1, BLM, BMPR1A, BRCA1, BRCA2, BRIP1,  CASR, CDC73, CDH1, CDK4, CDKN1B, CDKN1C, CDKN2A (p14ARF), CDKN2A (p16INK4a), CEBPA, CHEK2, CTNNA1, DICER1, DIS3L2, EGFR (c.2369C>T, p.Thr790Met variant only), EPCAM (Deletion/duplication testing only), FH, FLCN, GATA2, GPC3, GREM1 (Promoter region deletion/duplication testing only), HOXB13 (c.251G>A, p.Gly84Glu), HRAS, KIT, MAX, MEN1, MET, MITF (c.952G>A, p.Glu318Lys variant only), MLH1, MSH2, MSH3, MSH6, MUTYH, NBN, NF1, NF2, NTHL1, PALB2, PDGFRA, PHOX2B, PMS2, POLD1, POLE, POT1, PRKAR1A, PTCH1, PTEN, RAD50, RAD51C, RAD51D, RB1, RECQL4, RET, RNF43, RUNX1, SDHAF2, SDHA (sequence changes only), SDHB, SDHC, SDHD, SMAD4, SMARCA4, SMARCB1, SMARCE1, STK11, SUFU, TERC, TERT, TMEM127, TP53, TSC1, TSC2, VHL, WRN and WT1.  The report date is August 01, 2020.    08/16/2020 Surgery   Left lumpectomy [name]): IDC, grade 2, 2.3cm, with intermediate grade DCIS, clear margins, 1/2 left axillary lymph nodes positive for carcinoma.HER-2 equivocal by IHC (2+), negative by FISH, ER+ 95%, PR+40%, Ki67 15%.    08/24/2020 Cancer Staging   Staging form: Breast, AJCC 8th Edition - Pathologic stage from 08/24/2020: Stage IB (pT2, pN1a, cM0, G2, ER+, PR+, HER2-) - Signed by [name], MD on 08/24/2020    08/30/2020 Miscellaneous   MammaPrint high risk   09/13/2020 - 02/19/2021 Adjuvant Chemotherapy   Adriamycin/Cytoxan x 4 cycles followed by Taxol weekly x 12 (dose reduced cycles 5-12)   04/05/2021 - 05/22/2021 Radiation Therapy   Site Technique Total Dose (Gy) Dose per Fx (Gy) Completed Fx Beam Energies  Breast, Left: Breast_Lt 3D 50.4/50.4 1.8 28/28 10X  Breast, Left: Breast_Lt_SCLV 3D 50.4/50.4 1.8 28/28 6X, 10X  Breast, Left: Breast_Lt_Bst 3D 10/10 2 5/5 6X, 10X      05/29/2021 -  Anti-estrogen oral therapy   Anastrozole daily     CURRENT THERAPY:  Anastrozole  INTERVAL HISTORY: Lindsay Hampton 56 y.o. female returns for urgent evaluation of left lumpectomy changes.  She notes a new nodule at her left  lumpectomy site that appeared overnight.  She is concerned about this.    Her most recent mammogram was completed December 1 and showed some left breast calcifications requiring ultrasound and subsequent biopsy that were consistent with fibrocystic changes.  She has no other concerns today.   Patient Active Problem List   Diagnosis Date Noted   Genetic testing 08/01/2020   Family history of breast cancer 07/26/2020   Family history of prostate cancer 07/26/2020   Family history of colon cancer 07/26/2020   Malignant neoplasm of lower-outer quadrant of left breast of female, estrogen receptor positive ([place]) 07/20/2020    has No Known Allergies.  MEDICAL HISTORY: Past Medical History:  Diagnosis Date   Acute biliary pancreatitis without infection or necrosis    Acute cholecystitis    Allergy    seasonal   Anemia    After childbirth   Anxiety    Breast cancer ([place])    Elevated LFTs    Family history of breast cancer 07/26/2020   Family history of colon cancer 07/26/2020   Family history of prostate cancer 07/26/2020   GERD (gastroesophageal reflux disease)    Hypertensive urgency 01/08/2021   Personal history of chemotherapy    Personal history of radiation therapy    Pneumonia    In 20s    SURGICAL HISTORY: Past Surgical History:  Procedure Laterality Date   AXILLARY LYMPH NODE DISSECTION Left 08/16/2020   Procedure: LEFT TARGETED AXILLARY LYMPH NODE DISSECTION;  Surgeon: [name], MD;  Location: [facility];  Service: General;  Laterality: Left;   BREAST LUMPECTOMY     BREAST LUMPECTOMY WITH RADIOACTIVE SEED AND AXILLARY LYMPH NODE DISSECTION Left 08/16/2020   Procedure: LEFT BREAST LUMPECTOMY WITH RADIOACTIVE SEED;  Surgeon: [name], MD;  Location: [facility];  Service: General;  Laterality: Left;  PEC BLOCK   BREAST SURGERY Left 06/2020   breast bx    CERVICAL BIOPSY  W/ LOOP ELECTRODE EXCISION  12/2017   CESAREAN SECTION     CHOLECYSTECTOMY N/A 01/09/2021    Procedure: LAPAROSCOPIC CHOLECYSTECTOMY WITH INTRAOPERATIVE CHOLANGIOGRAM;  Surgeon: Donnie Mesa, MD;  Location: [facility];  Service: General;  Laterality: N/A;   DILATION AND CURETTAGE OF UTERUS     ERCP N/A 01/10/2021   Procedure: ENDOSCOPIC RETROGRADE CHOLANGIOPANCREATOGRAPHY (ERCP);  Surgeon: Ladene Artist, MD;  Location: [facility];  Service: Endoscopy;  Laterality: N/A;   PORTACATH PLACEMENT  09/12/2020   Procedure: INSERTION PORT-A-CATH WITH ULTRASOUND;  Surgeon: [name], MD;  Location: [facility];  Service: General;;   SPHINCTEROTOMY  01/10/2021   Procedure: Joan Mayans;  Surgeon: Ladene Artist, MD;  Location: [facility];  Service: Endoscopy;;  balloon sweep   WISDOM TOOTH EXTRACTION  2020    SOCIAL HISTORY: Social History   Socioeconomic History   Marital status: Married    Spouse name: Not on file   Number of children: Not on file   Years of education: Not on file   Highest education level: Not on file  Occupational History   Not on file  Tobacco Use   Smoking status: Never   Smokeless tobacco: Never  Vaping Use   Vaping Use: Never used  Substance and Sexual Activity   Alcohol use: Not Currently    Comment: not since breast cancer dx   Drug use: No   Sexual activity:  Not Currently    Partners: Male    Birth control/protection: None  Other Topics Concern   Not on file  Social History Narrative   Not on file   Social Determinants of Health   Financial Resource Strain: Not on file  Food Insecurity: Not on file  Transportation Needs: Not on file  Physical Activity: Not on file  Stress: Not on file  Social Connections: Not on file  Intimate Partner Violence: Not on file    FAMILY HISTORY: Family History  Problem Relation Age of Onset   Breast cancer Mother        dx 32   Prostate cancer Father        dx late 50s/early 28s   Colon cancer Maternal Grandmother        early 2s   Breast cancer Cousin        dx 69   Esophageal cancer  Maternal Grandfather        dx 56s   Cancer Maternal Aunt        maternal grandmother's sister; possible ovarian? dx 61, d. 69s-80s   Rectal cancer Neg Hx    Stomach cancer Neg Hx     Review of Systems  Constitutional:  Negative for appetite change, chills, fatigue, fever and unexpected weight change.  HENT:   Negative for hearing loss, lump/mass and trouble swallowing.   Eyes:  Negative for eye problems and icterus.  Respiratory:  Negative for chest tightness, cough and shortness of breath.   Cardiovascular:  Negative for chest pain, leg swelling and palpitations.  Gastrointestinal:  Negative for abdominal distention, abdominal pain, constipation, diarrhea, nausea and vomiting.  Endocrine: Negative for hot flashes.  Genitourinary:  Negative for difficulty urinating.   Musculoskeletal:  Negative for arthralgias.  Skin:  Negative for itching and rash.  Neurological:  Negative for dizziness, extremity weakness, headaches and numbness.  Hematological:  Negative for adenopathy. Does not bruise/bleed easily.  Psychiatric/Behavioral:  Negative for depression. The patient is not nervous/anxious.      PHYSICAL EXAMINATION  ECOG PERFORMANCE STATUS: In face-to-face visit time, chart review, lab review, care coordination, and documentation of the encounter.  Vitals:   11/30/21 1157  BP: (!) 147/87  Pulse: (!) 103  Resp: 18  Temp: 97.7 F (36.5 C)  SpO2: 99%    Physical Exam Constitutional:      General: She is not in acute distress.    Appearance: Normal appearance. She is not toxic-appearing.  HENT:     Head: Normocephalic and atraumatic.  Eyes:     General: No scleral icterus. Cardiovascular:     Rate and Rhythm: Normal rate and regular rhythm.     Pulses: Normal pulses.     Heart sounds: Normal heart sounds.  Pulmonary:     Effort: Pulmonary effort is normal.     Breath sounds: Normal breath sounds.  Chest:       Comments: Above is the picture of the location of the  left lumpectomy site there is a small 0.5 cm asymmetrical nodule at the lumpectomy site. Abdominal:     General: Abdomen is flat. Bowel sounds are normal. There is no distension.     Palpations: Abdomen is soft.     Tenderness: There is no abdominal tenderness.  Musculoskeletal:        General: No swelling.     Cervical back: Neck supple.  Lymphadenopathy:     Cervical: No cervical adenopathy.  Skin:    General: Skin is  warm and dry.     Findings: No rash.  Neurological:     General: No focal deficit present.     Mental Status: She is alert.  Psychiatric:        Mood and Affect: Mood normal.        Behavior: Behavior normal.    LABORATORY DATA:  CBC    Component Value Date/Time   WBC 6.2 04/26/2021 1051   WBC 6.7 01/11/2021 0300   RBC 4.41 04/26/2021 1051   HGB 12.8 04/26/2021 1051   HGB 13.1 12/15/2017 1515   HCT 39.2 04/26/2021 1051   HCT 39.2 12/15/2017 1515   PLT 279 04/26/2021 1051   PLT 360 12/15/2017 1515   MCV 88.9 04/26/2021 1051   MCV 93 12/15/2017 1515   MCH 29.0 04/26/2021 1051   MCHC 32.7 04/26/2021 1051   RDW 14.6 04/26/2021 1051   RDW 13.4 12/15/2017 1515   LYMPHSABS 0.9 04/26/2021 1051   MONOABS 0.6 04/26/2021 1051   EOSABS 0.2 04/26/2021 1051   BASOSABS 0.0 04/26/2021 1051    CMP     Component Value Date/Time   NA 140 04/26/2021 1051   NA 139 12/15/2017 1515   K 4.2 04/26/2021 1051   CL 106 04/26/2021 1051   CO2 24 04/26/2021 1051   GLUCOSE 91 04/26/2021 1051   BUN 14 04/26/2021 1051   BUN 14 12/15/2017 1515   CREATININE 0.72 04/26/2021 1051   CREATININE 0.94 12/17/2016 1248   CALCIUM 10.1 04/26/2021 1051   PROT 7.4 04/26/2021 1051   PROT 6.8 03/16/2018 1353   ALBUMIN 4.0 04/26/2021 1051   ALBUMIN 4.6 03/16/2018 1353   AST 22 04/26/2021 1051   ALT 22 04/26/2021 1051   ALKPHOS 75 04/26/2021 1051   BILITOT 0.6 04/26/2021 1051   GFRNONAA >60 04/26/2021 1051   GFRAA >60 08/14/2020 0958   GFRAA >60 07/26/2020 0801          ASSESSMENT and THERAPY PLAN:   Malignant neoplasm of lower-outer quadrant of left breast of female, estrogen receptor positive ([place]) 08/16/2020:Left lumpectomy [name]): IDC, grade 2, 2.3cm, with intermediate grade DCIS, clear margins, 1/2 left axillary lymph nodes positive for carcinoma.HER-2 equivocal by IHC (2+), negative by FISH, ER+ 95%, PR+40%, Ki67 15%.  T2 N1 M0 stage Ib MammaPrint: High risk (average 10-year risk untreated: 29%, predicted benefit of chemo with hormone therapy at 5 years: 94.6%   Treatment plan:  1. Adjuvant chemotherapy with dose dense Adriamycin and Cytoxan x4 followed by Taxol  completed 02/19/21 2. Followed by adjuvant radiation 04/06/21- 05/22/21 3. Followed by adjuvant antiestrogen therapy ------------------------------------------------------------------------------------------------------------------------------------ Anastrozole daily, tolerating well  Geraldy is here to follow-up about a new nodule in her left lumpectomy site.  I placed orders for diagnostic mammogram and ultrasound of her left breast to be conducted.  She verbalized understanding of the above and my nurse will give her the appointment date and time.  Tye has a follow-up appointment with Dr. Lindi Adie on February 1 and we will follow-up with him about the imaging.   Orders Placed This Encounter  Procedures   MM DIAG BREAST TOMO UNI LEFT    [facility] 10-25-21 BCG/lumpectomy site nodule/ NO NEEDS LFT LUMPECT 07/2020/ TOMO LC W/PT     Standing Status:   Future    Standing Expiration Date:   11/30/2022    Order Specific Question:   Reason for Exam (SYMPTOM  OR DIAGNOSIS REQUIRED)    Answer:   lumpectomy site nodule    Order Specific  Question:   Preferred imaging location?    Answer:   [facility]    Order Specific Question:   Is the patient pregnant?    Answer:   No   US BREAST LTD UNI LEFT INC AXILLA    [facility] 10-25-21 BCG/lumpectomy site nodule/ NO NEEDS LFT LUMPECT 07/2020/  TOMO LC W/PT      Standing Status:   Future    Standing Expiration Date:   11/30/2022    Order Specific Question:   Reason for Exam (SYMPTOM  OR DIAGNOSIS REQUIRED)    Answer:   new lumpectomy site nodule    Order Specific Question:   Preferred imaging location?    Answer:   [facility]    She knows to call for any questions that may arise between now and her next appointment.  We are happy to see her sooner if needed.  Total encounter time: 20 minutes in face-to-face visit time, chart review, lab review, care coordination, and documentation of encounter.  Wilber Bihari, NP 11/30/21 1:05 PM Medical Oncology and Hematology [facility] [place], [place] [address] Tel. [phone]    Fax. [phone]  *Total Encounter Time as defined by the Centers for Medicare and Medicaid Services includes, in addition to the face-to-face time of a patient visit (documented in the note above) non-face-to-face time: obtaining and reviewing outside history, ordering and reviewing medications, tests or procedures, care coordination (communications with other health care professionals or caregivers) and documentation in the medical record.

## 2021-12-19 ENCOUNTER — Other Ambulatory Visit: Payer: 59

## 2021-12-25 NOTE — Assessment & Plan Note (Signed)
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 T2 N1 M0 stage Ib MammaPrint: High risk (average 10-year risk untreated: 29%, predicted benefit of chemo with hormone therapy at 5 years: 94.6%  Treatment plan: 1.Adjuvant chemotherapy withdose dense Adriamycin and Cytoxan x4 followed by Taxol completed 02/19/21 2.Followed by adjuvant radiation 04/06/21- 05/22/21 3.Followed byadjuvant antiestrogen therapy with Anastrozole started 05/2021 ------------------------------------------------------------------------------------------------------------------------------------ Anastrozole daily, tolerating well  Breast Cancer Surveillance: Palpable Nodule: 11/30/21: Mamm and [place]: Benign Breast Exam: 12/25/21: Benign  RTC in 1 year

## 2021-12-25 NOTE — Progress Notes (Signed)
Patient Care Team: [name], MD as PCP - General (Family Medicine) [name], MD as Consulting Physician (Hematology and Oncology) Kyung Rudd, MD as Consulting Physician (Radiation Oncology)  DIAGNOSIS:    ICD-10-CM   1. Malignant neoplasm of lower-outer quadrant of left breast of female, estrogen receptor positive ([place])  C50.512    Z17.0       SUMMARY OF ONCOLOGIC HISTORY: Oncology History  Malignant neoplasm of lower-outer quadrant of left breast of female, estrogen receptor positive ([place])  07/20/2020 Initial Diagnosis   Screening mammogram detected left breast calcifications, enlarged left axillary lymph nodes, and an asymmetry in the right breast. Diagnostic mammogram showed in the left breast, a 2.4cm mass with calcifications, 3:30 position, a left axillary lymph node with cortical thickness, and in the right breast, no suspicious masses or calcifications. Left breast biopsy showed invasive and in situ mammary carcinoma in the breast and axilla, grade 2, HER-2 equivocal by IHC (2+), negative by FISH, ER+ 95%, PR+40%, Ki67 15%.    07/26/2020 Cancer Staging   Staging form: Breast, AJCC 8th Edition - Clinical stage from 07/26/2020: Stage IIA (cT2, cN1(f), cM0, G2, ER+, PR+, HER2-) - Signed by [name], MD on 07/26/2020    08/01/2020 Genetic Testing   No pathogenic variants detected in [facility] Multi-Cancer Panel.  The Multi-Cancer Panel offered by [facility] includes sequencing and/or deletion duplication testing of the following 85 genes: AIP, ALK, APC, ATM, AXIN2,BAP1,  BARD1, BLM, BMPR1A, BRCA1, BRCA2, BRIP1, CASR, CDC73, CDH1, CDK4, CDKN1B, CDKN1C, CDKN2A (p14ARF), CDKN2A (p16INK4a), CEBPA, CHEK2, CTNNA1, DICER1, DIS3L2, EGFR (c.2369C>T, p.Thr790Met variant only), EPCAM (Deletion/duplication testing only), FH, FLCN, GATA2, GPC3, GREM1 (Promoter region deletion/duplication testing only), HOXB13 (c.251G>A, p.Gly84Glu), HRAS, KIT, MAX, MEN1, MET, MITF (c.952G>A, p.Glu318Lys  variant only), MLH1, MSH2, MSH3, MSH6, MUTYH, NBN, NF1, NF2, NTHL1, PALB2, PDGFRA, PHOX2B, PMS2, POLD1, POLE, POT1, PRKAR1A, PTCH1, PTEN, RAD50, RAD51C, RAD51D, RB1, RECQL4, RET, RNF43, RUNX1, SDHAF2, SDHA (sequence changes only), SDHB, SDHC, SDHD, SMAD4, SMARCA4, SMARCB1, SMARCE1, STK11, SUFU, TERC, TERT, TMEM127, TP53, TSC1, TSC2, VHL, WRN and WT1.  The report date is August 01, 2020.    08/16/2020 Surgery   Left lumpectomy [name]): IDC, grade 2, 2.3cm, with intermediate grade DCIS, clear margins, 1/2 left axillary lymph nodes positive for carcinoma.HER-2 equivocal by IHC (2+), negative by FISH, ER+ 95%, PR+40%, Ki67 15%.    08/24/2020 Cancer Staging   Staging form: Breast, AJCC 8th Edition - Pathologic stage from 08/24/2020: Stage IB (pT2, pN1a, cM0, G2, ER+, PR+, HER2-) - Signed by [name], MD on 08/24/2020    08/30/2020 Miscellaneous   MammaPrint high risk   09/13/2020 - 02/19/2021 Adjuvant Chemotherapy   Adriamycin/Cytoxan x 4 cycles followed by Taxol weekly x 12 (dose reduced cycles 5-12)   04/05/2021 - 05/22/2021 Radiation Therapy   Site Technique Total Dose (Gy) Dose per Fx (Gy) Completed Fx Beam Energies  Breast, Left: Breast_Lt 3D 50.4/50.4 1.8 28/28 10X  Breast, Left: Breast_Lt_SCLV 3D 50.4/50.4 1.8 28/28 6X, 10X  Breast, Left: Breast_Lt_Bst 3D 10/10 2 5/5 6X, 10X      05/29/2021 -  Anti-estrogen oral therapy   Anastrozole daily     CHIEF COMPLIANT: Follow-up of left breast cancer  INTERVAL HISTORY: Lindsay Hampton is a 56 y.o. with above-mentioned history of left breast cancer having undergone lumpectomy, currently on antiestrogen therapy with anastrozole. She presents to the clinic today for follow-up.   ALLERGIES:  has No Known Allergies.  MEDICATIONS:  Current Outpatient Medications  Medication Sig Dispense  Refill   acetaminophen (TYLENOL) 325 MG tablet You can take 2 tablets every 4 hours as needed for pain.  Do not take over 4000 mg of Tylenol/acetaminophen per  day.  Tylenol#3 is Tylenol and codeine.  You need to count each of these tablets as part of your daily maximum of Tylenol.     ALPRAZolam (XANAX) 0.25 MG tablet TAKE 1 TABLET BY MOUTH TWICE A DAY AS NEEDED FOR ANXIETY 60 tablet 3   anastrozole (ARIMIDEX) 1 MG tablet Take 1 tablet (1 mg total) by mouth daily. 90 tablet 3   Calcium Carbonate-Vit D-Min (CALCIUM 1200 PO) Take by mouth daily.     gabapentin (NEURONTIN) 300 MG capsule Take 1 capsule (300 mg total) by mouth at bedtime. (Patient not taking: Reported on 11/30/2021) 90 capsule 3   Multiple Vitamin (MULTIVITAMIN) tablet Take 1 tablet by mouth daily.     vitamin C (ASCORBIC ACID) 500 MG tablet Take 500 mg by mouth daily.     VITAMIN D PO Take 5,000 Units by mouth daily.     No current facility-administered medications for this visit.    PHYSICAL EXAMINATION: ECOG PERFORMANCE STATUS: 1 - Symptomatic but completely ambulatory  Vitals:   12/26/21 1038  BP: 129/89  Pulse: 96  Resp: 17  Temp: (!) 97 F (36.1 C)  SpO2: 100%   Filed Weights   12/26/21 1038  Weight: 234 lb 12.8 oz (106.5 kg)    BREAST: No palpable masses or nodules in either right or left breasts. No palpable axillary supraclavicular or infraclavicular adenopathy no breast tenderness or nipple discharge. (exam performed in the presence of a chaperone)  LABORATORY DATA:  I have reviewed the data as listed CMP Latest Ref Rng & Units 04/26/2021 02/19/2021 02/12/2021  Glucose 70 - 99 mg/dL 91 131(H) 113(H)  BUN 6 - 20 mg/dL _0 Creatinine 0.44 - 1.00 mg/dL 0.72 0.72 0.78  Sodium 135 - 145 mmol/L 140 139 135  Potassium 3.5 - 5.1 mmol/L 4.2 4.2 4.0  Chloride 98 - 111 mmol/L 106 106 104  CO2 22 - 32 mmol/L _1 Calcium 8.9 - 10.3 mg/dL 10.1 9.3 10.4(H)  Total Protein 6.5 - 8.1 g/dL 7.4 6.9 7.3  Total Bilirubin 0.3 - 1.2 mg/dL 0.6 0.5 0.6  Alkaline Phos 38 - 126 U/L 75 62 66  AST 15 - 41 U/L 22 25 34  ALT 0 - 44 U/L 22 35 44    Lab Results  Component Value  Date   WBC 6.2 04/26/2021   HGB 12.8 04/26/2021   HCT 39.2 04/26/2021   MCV 88.9 04/26/2021   PLT 279 04/26/2021   NEUTROABS 4.4 04/26/2021    ASSESSMENT & PLAN:  Malignant neoplasm of lower-outer quadrant of left breast of female, estrogen receptor positive ([place]) 08/16/2020:Left lumpectomy [name]): IDC, grade 2, 2.3cm, with intermediate grade DCIS, clear margins, 1/2 left axillary lymph nodes positive for carcinoma.HER-2 equivocal by IHC (2+), negative by FISH, ER+ 95%, PR+40%, Ki67 15%.  T2 N1 M0 stage Ib MammaPrint: High risk (average 10-year risk untreated: 29%, predicted benefit of chemo with hormone therapy at 5 years: 94.6%   Treatment plan:  1. Adjuvant chemotherapy with dose dense Adriamycin and Cytoxan x4 followed by Taxol  completed 02/19/21 2. Followed by adjuvant radiation 04/06/21- 05/22/21 3. Followed by adjuvant antiestrogen therapy with Anastrozole started 05/2021 ------------------------------------------------------------------------------------------------------------------------------------ Anastrozole daily, tolerating well   Breast Cancer Surveillance: Palpable Nodule: 11/30/21: Mamm and [place]: Benign Breast Exam: 12/25/21: Benign  RTC in 1 year     No orders of the defined types were placed in this encounter.  The patient has a good understanding of the overall plan. she agrees with it. she will call with any problems that may develop before the next visit here.  Total time spent: 20 mins including face to face time and time spent for planning, charting and coordination of care  Rulon Eisenmenger, MD, MPH 12/26/2021  I, Thana Ates, am acting as scribe for Dr. [name].  I have reviewed the above documentation for accuracy and completeness, and I agree with the above.

## 2021-12-26 ENCOUNTER — Other Ambulatory Visit: Payer: Self-pay

## 2021-12-26 ENCOUNTER — Inpatient Hospital Stay: Payer: 59 | Attending: Hematology and Oncology | Admitting: Hematology and Oncology

## 2021-12-26 DIAGNOSIS — C50512 Malignant neoplasm of lower-outer quadrant of left female breast: Secondary | ICD-10-CM | POA: Insufficient documentation

## 2021-12-26 DIAGNOSIS — Z170 Estrogen receptor positive status [ER+]: Secondary | ICD-10-CM | POA: Insufficient documentation

## 2021-12-26 MED ORDER — VITAMIN D (CHOLECALCIFEROL) 25 MCG (1000 UT) PO TABS: 1.0000 | ORAL_TABLET | Freq: Every day | ORAL | Status: DC

## 2021-12-26 MED ORDER — ANASTROZOLE 1 MG PO TABS: 1.0000 mg | ORAL_TABLET | Freq: Every day | ORAL | 3 refills | Status: DC

## 2021-12-26 MED ORDER — BIOTIN 1 MG PO CAPS: 1.0000 | ORAL_CAPSULE | Freq: Every day | ORAL | Status: AC

## 2021-12-26 MED ORDER — CALCIUM CITRATE 1040 MG PO TABS: 1.0000 | ORAL_TABLET | Freq: Every day | ORAL | 0 refills | Status: DC

## 2022-01-02 ENCOUNTER — Encounter (HOSPITAL_COMMUNITY): Payer: Self-pay

## 2022-01-02 ENCOUNTER — Other Ambulatory Visit: Payer: 59

## 2022-03-12 ENCOUNTER — Telehealth: Payer: Self-pay | Admitting: *Deleted

## 2022-03-12 NOTE — Telephone Encounter (Signed)
Received call from pt with complaint of intermittent right sided upper abdominal and back pain.  Pt states she is extremely active and is constantly lifting heavy objects and states when her spouse massages her back, the tension releases. Pt denies recent injury/trauma, lumps/nodules or shortness of breath. Per MD pt needing to rest, take OTC ibuprofen and f/u with PCP for evaluation a possible pulled muscle.  Pt educated to contact our office for [facility] visit if needed.  Pt verbalized understanding.  ?

## 2022-03-18 ENCOUNTER — Other Ambulatory Visit: Payer: Self-pay | Admitting: Hematology and Oncology

## 2022-03-18 DIAGNOSIS — F4322 Adjustment disorder with anxiety: Secondary | ICD-10-CM

## 2022-04-09 ENCOUNTER — Telehealth: Payer: Self-pay

## 2022-04-09 NOTE — Telephone Encounter (Signed)
Return call to pt, pt reports fullness around left breast surgical site, states it is resolved with massage techniques she learned at lymphedema clinic.  I advised pt to continue with the massage and exercises she learned but to reach back out to [place] if she noticed increase in swelling, redness, tenderness, or warmth.  Pt verbalized understanding and thanks  ?

## 2022-04-09 NOTE — Telephone Encounter (Signed)
Return call to pt, left VM to discuss breast concerns.  Requested call back.   ?

## 2022-06-17 NOTE — Progress Notes (Signed)
56 y.o. [id number] Married White or Caucasian Not Hispanic or Latino female here for annual exam.  No vaginal bleeding. She is sexually active, some dryness, helped with lubricant.    H/O left breast cancer.  In 9/21 she had a lumpectomy and SLND 1/2 nodes + She underwent Chemo from October until March.  Radiation from 5/22-6/22 On Arimidex, doing okay.   Negative genetic testing.  No bowel or bladder c/o.   She would like her Vitamin D check, h/o low vit d, on supplements.   She has some redness on her neck.   Patient's last menstrual period was 07/26/2000.          Sexually active: Yes.    The current method of family planning is post menopausal status.    Exercising: Yes.     Walking x 5 days a week  Smoker:  no  Health Maintenance: Pap:  06/21/21 ASCUS Hr HPV Neg    03/22/20 ASCUS Hr HPV neg 01/19/19 neg HPV Neg,   12/15/2017 ASCUS pos HPV,  12-17-16 ASCUS + HR HPV  History of abnormal Pap:   yes, Colpo 04/13/20 CIN I ECC neg H/O LEEP  In 2/19 for HSIL, pathology with CIN II, negative margins, negative ECC. MMG:  11/30/21 Density B Bi-rads 2 benign hx of left breast cancer  BMD:   11/09/21 normal  Colonoscopy: 02/24/18 polyps/f/u 5 years  TDaP:  06/19/18 Gardasil: n/a   reports that she has never smoked. She has never used smokeless tobacco. She reports that she does not currently use alcohol. She reports that she does not use drugs.Kids are grown, no grandchildren.   Past Medical History:  Diagnosis Date   Acute biliary pancreatitis without infection or necrosis    Acute cholecystitis    Allergy    seasonal   Anemia    After childbirth   Anxiety    Breast cancer ([place])    Elevated LFTs    Family history of breast cancer 07/26/2020   Family history of colon cancer 07/26/2020   Family history of prostate cancer 07/26/2020   GERD (gastroesophageal reflux disease)    Hypertensive urgency 01/08/2021   Personal history of chemotherapy    Personal history of radiation therapy     Pneumonia    In 20s    Past Surgical History:  Procedure Laterality Date   AXILLARY LYMPH NODE DISSECTION Left 08/16/2020   Procedure: LEFT TARGETED AXILLARY LYMPH NODE DISSECTION;  Surgeon: [name], MD;  Location: [facility];  Service: General;  Laterality: Left;   BREAST LUMPECTOMY Left 10/2021   BREAST LUMPECTOMY WITH RADIOACTIVE SEED AND AXILLARY LYMPH NODE DISSECTION Left 08/16/2020   Procedure: LEFT BREAST LUMPECTOMY WITH RADIOACTIVE SEED;  Surgeon: [name], MD;  Location: [facility];  Service: General;  Laterality: Left;  PEC BLOCK   BREAST SURGERY Left 06/2020   breast bx    CERVICAL BIOPSY  W/ LOOP ELECTRODE EXCISION  12/2017   CESAREAN SECTION     CHOLECYSTECTOMY N/A 01/09/2021   Procedure: LAPAROSCOPIC CHOLECYSTECTOMY WITH INTRAOPERATIVE CHOLANGIOGRAM;  Surgeon: Donnie Mesa, MD;  Location: [facility];  Service: General;  Laterality: N/A;   DILATION AND CURETTAGE OF UTERUS     ERCP N/A 01/10/2021   Procedure: ENDOSCOPIC RETROGRADE CHOLANGIOPANCREATOGRAPHY (ERCP);  Surgeon: Ladene Artist, MD;  Location: [facility];  Service: Endoscopy;  Laterality: N/A;   PORTACATH PLACEMENT  09/12/2020   Procedure: INSERTION PORT-A-CATH WITH ULTRASOUND;  Surgeon: [name], MD;  Location: [facility];  Service: General;;  SPHINCTEROTOMY  01/10/2021   Procedure: SPHINCTEROTOMY;  Surgeon: Ladene Artist, MD;  Location: [facility];  Service: Endoscopy;;  balloon sweep   WISDOM TOOTH EXTRACTION  2020    Current Outpatient Medications  Medication Sig Dispense Refill   acetaminophen (TYLENOL) 325 MG tablet You can take 2 tablets every 4 hours as needed for pain.  Do not take over 4000 mg of Tylenol/acetaminophen per day.  Tylenol#3 is Tylenol and codeine.  You need to count each of these tablets as part of your daily maximum of Tylenol.     ALPRAZolam (XANAX) 0.25 MG tablet TAKE 1 TABLET BY MOUTH TWICE A DAY AS NEEDED FOR ANXIETY 60 tablet 3   anastrozole (ARIMIDEX) 1 MG tablet Take 1  tablet (1 mg total) by mouth daily. 90 tablet 3   Biotin 1 MG CAPS Take 1 capsule by mouth daily. 30 capsule    Calcium Carbonate-Vit D-Min (CALCIUM 1200 PO) Take by mouth daily.     Calcium Citrate 1040 MG TABS Take 1 tablet (1,040 mg total) by mouth daily.  0   Multiple Vitamin (MULTIVITAMIN) tablet Take 1 tablet by mouth daily.     vitamin C (ASCORBIC ACID) 500 MG tablet Take 500 mg by mouth daily.     VITAMIN D PO Take 5,000 Units by mouth daily.     Vitamin D, Cholecalciferol, 25 MCG (1000 UT) TABS Take 1 tablet by mouth daily. 60 tablet    No current facility-administered medications for this visit.    Family History  Problem Relation Age of Onset   Breast cancer Mother        dx 16   Prostate cancer Father        dx late 50s/early 72s   Colon cancer Maternal Grandmother        early 59s   Breast cancer Cousin        dx 61   Esophageal cancer Maternal Grandfather        dx 103s   Cancer Maternal Aunt        maternal grandmother's sister; possible ovarian? dx 47, d. 54s-80s   Rectal cancer Neg Hx    Stomach cancer Neg Hx     Review of Systems  All other systems reviewed and are negative.   Exam:   BP 122/76   Pulse 73   Ht '5\' 6"'$  (1.676 m)   Wt 224 lb (101.6 kg)   LMP 07/26/2000   SpO2 100%   BMI 36.15 kg/m   Weight change: '@WEIGHTCHANGE'$ @ Height:   Height: '5\' 6"'$  (167.6 cm)  Ht Readings from Last 3 Encounters:  06/25/22 '5\' 6"'$  (1.676 m)  09/05/21 '5\' 6"'$  (1.676 m)  06/21/21 '5\' 6"'$  (1.676 m)    General appearance: alert, cooperative and appears stated age Head: Normocephalic, without obvious abnormality, atraumatic Neck: no adenopathy, supple, symmetrical, trachea midline and thyroid normal to inspection and palpation Lungs: clear to auscultation bilaterally Cardiovascular: regular rate and rhythm Breasts: normal appearance, no masses or tenderness, evidence of left lumpectomy Abdomen: soft, non-tender; non distended,  no masses,  no organomegaly Extremities:  extremities normal, atraumatic, no cyanosis or edema Skin: Skin color, texture, turgor normal. No rashes or lesions Lymph nodes: Cervical, supraclavicular, and axillary nodes normal. No abnormal inguinal nodes palpated Neurologic: Grossly normal   Pelvic: External genitalia:  no lesions              Urethra:  normal appearing urethra with no masses, tenderness or lesions  Bartholins and Skenes: normal                 Vagina: mildly atrophic appearing vagina with normal color and discharge, no lesions              Cervix: no lesions               Bimanual Exam:  Uterus:   no masses or tenderness              Adnexa: no mass, fullness, tenderness               Rectovaginal: Confirms               Anus:  normal sphincter tone, no lesions  Gae Dry chaperoned for the exam.  1. Well woman exam Getting calcium supplement Mammogram is UTD Colonoscopy is due next year  2. Screening for cervical cancer - Cytology - PAP  3. History of breast cancer Does breast self exams Seen at Oncology yearly Mammogram is UTD  4. BMI 36.0-36.9,adult - Hemoglobin A1c  5. Screening, lipid - Lipid panel  6. Vitamin D deficiency - VITAMIN D 25 Hydroxy (Vit-D Deficiency, Fractures)  7. History of prediabetes Last year hgbA1C was 5.7 with her primary care provider. She has changed her diet.  - Hemoglobin A1c

## 2022-06-25 ENCOUNTER — Other Ambulatory Visit (HOSPITAL_COMMUNITY)
Admission: RE | Admit: 2022-06-25 | Discharge: 2022-06-25 | Disposition: A | Discharge status: Home / Self Care | Payer: 59 | Source: Ambulatory Visit | Attending: Obstetrics and Gynecology | Admitting: Obstetrics and Gynecology

## 2022-06-25 ENCOUNTER — Encounter: Payer: Self-pay | Admitting: Obstetrics and Gynecology

## 2022-06-25 ENCOUNTER — Ambulatory Visit (INDEPENDENT_AMBULATORY_CARE_PROVIDER_SITE_OTHER): Payer: 59 | Admitting: Obstetrics and Gynecology

## 2022-06-25 VITALS — BP 122/76 | HR 73 | Ht 66.0 in | Wt 224.0 lb

## 2022-06-25 DIAGNOSIS — E559 Vitamin D deficiency, unspecified: Secondary | ICD-10-CM

## 2022-06-25 DIAGNOSIS — Z853 Personal history of malignant neoplasm of breast: Secondary | ICD-10-CM | POA: Diagnosis not present

## 2022-06-25 DIAGNOSIS — Z6836 Body mass index (BMI) 36.0-36.9, adult: Secondary | ICD-10-CM | POA: Diagnosis not present

## 2022-06-25 DIAGNOSIS — Z124 Encounter for screening for malignant neoplasm of cervix: Secondary | ICD-10-CM

## 2022-06-25 DIAGNOSIS — Z13220 Encounter for screening for lipoid disorders: Secondary | ICD-10-CM

## 2022-06-25 DIAGNOSIS — Z01419 Encounter for gynecological examination (general) (routine) without abnormal findings: Secondary | ICD-10-CM

## 2022-06-25 DIAGNOSIS — Z87898 Personal history of other specified conditions: Secondary | ICD-10-CM

## 2022-06-25 NOTE — Patient Instructions (Signed)

## 2022-06-26 LAB — HEMOGLOBIN A1C
Hgb A1c MFr Bld: 5.4 % of total Hgb (ref <5.7)
Mean Plasma Glucose: 108 mg/dL
eAG (mmol/L): 6 mmol/L

## 2022-06-26 LAB — LIPID PANEL
Cholesterol: 197 mg/dL (ref <200)
HDL: 60 mg/dL (ref >=50)
LDL Cholesterol (Calc): 107 mg/dL (calc) — ABNORMAL HIGH
Non-HDL Cholesterol (Calc): 137 mg/dL (calc) — ABNORMAL HIGH (ref <130)
Total CHOL/HDL Ratio: 3.3 (calc) (ref <5.0)
Triglycerides: 187 mg/dL — ABNORMAL HIGH (ref <150)

## 2022-06-26 LAB — VITAMIN D 25 HYDROXY (VIT D DEFICIENCY, FRACTURES): Vit D, 25-Hydroxy: 51 ng/mL (ref 30–100)

## 2022-07-01 LAB — CYTOLOGY - PAP
Comment: NEGATIVE
Diagnosis: NEGATIVE
High risk HPV: NEGATIVE

## 2022-08-28 ENCOUNTER — Other Ambulatory Visit: Payer: Self-pay | Admitting: Hematology and Oncology

## 2022-08-28 DIAGNOSIS — Z853 Personal history of malignant neoplasm of breast: Secondary | ICD-10-CM

## 2022-09-23 ENCOUNTER — Telehealth: Payer: Self-pay | Admitting: *Deleted

## 2022-09-23 DIAGNOSIS — Z170 Estrogen receptor positive status [ER+]: Secondary | ICD-10-CM

## 2022-09-23 NOTE — Telephone Encounter (Signed)
Received call from pt stating over the weekend she lifted heavy objects and is now experiencing left breast pain and swelling.  Pt states she has a hx of left breast lymphedema and believes she is having a flair.  Pt denies any redness or warmth to the breast.  Per MD pt needing to be referred to lymphedema rehab.  Referral placed, pt verbalized understanding.

## 2022-09-24 ENCOUNTER — Other Ambulatory Visit: Payer: Self-pay

## 2022-09-24 ENCOUNTER — Encounter: Payer: Self-pay | Admitting: Rehabilitation

## 2022-09-24 ENCOUNTER — Ambulatory Visit: Payer: 59 | Attending: Hematology and Oncology | Admitting: Rehabilitation

## 2022-09-24 DIAGNOSIS — Z483 Aftercare following surgery for neoplasm: Secondary | ICD-10-CM | POA: Diagnosis not present

## 2022-09-24 DIAGNOSIS — I890 Lymphedema, not elsewhere classified: Secondary | ICD-10-CM

## 2022-09-24 DIAGNOSIS — Z170 Estrogen receptor positive status [ER+]: Secondary | ICD-10-CM

## 2022-09-24 DIAGNOSIS — C50512 Malignant neoplasm of lower-outer quadrant of left female breast: Secondary | ICD-10-CM | POA: Insufficient documentation

## 2022-09-24 DIAGNOSIS — L599 Disorder of the skin and subcutaneous tissue related to radiation, unspecified: Secondary | ICD-10-CM | POA: Insufficient documentation

## 2022-09-24 NOTE — Therapy (Signed)
OUTPATIENT PHYSICAL THERAPY ONCOLOGY EVALUATION  Patient Name: Lindsay Hampton MRN: [id number] DOB:07-14-1966, 56 y.o., female Today's Date: 09/24/2022   PT End of Session - 09/24/22 1051     Visit Number 1    Number of Visits 7    Date for PT Re-Evaluation 10/22/22    PT Start Time 1003    PT Stop Time 1050    PT Time Calculation (min) 47 min    Activity Tolerance Patient tolerated treatment well    Behavior During Therapy [facility] for tasks assessed/performed             Past Medical History:  Diagnosis Date   Acute biliary pancreatitis without infection or necrosis    Acute cholecystitis    Allergy    seasonal   Anemia    After childbirth   Anxiety    Breast cancer ([place])    Elevated LFTs    Family history of breast cancer 07/26/2020   Family history of colon cancer 07/26/2020   Family history of prostate cancer 07/26/2020   GERD (gastroesophageal reflux disease)    Hypertensive urgency 01/08/2021   Personal history of chemotherapy    Personal history of radiation therapy    Pneumonia    In 20s   Past Surgical History:  Procedure Laterality Date   AXILLARY LYMPH NODE DISSECTION Left 08/16/2020   Procedure: LEFT TARGETED AXILLARY LYMPH NODE DISSECTION;  Surgeon: [name], MD;  Location: [place];  Service: General;  Laterality: Left;   BREAST LUMPECTOMY Left 10/2021   BREAST LUMPECTOMY WITH RADIOACTIVE SEED AND AXILLARY LYMPH NODE DISSECTION Left 08/16/2020   Procedure: LEFT BREAST LUMPECTOMY WITH RADIOACTIVE SEED;  Surgeon: [name], MD;  Location: [place];  Service: General;  Laterality: Left;  PEC BLOCK   BREAST SURGERY Left 06/2020   breast bx    CERVICAL BIOPSY  W/ LOOP ELECTRODE EXCISION  12/2017   CESAREAN SECTION     CHOLECYSTECTOMY N/A 01/09/2021   Procedure: LAPAROSCOPIC CHOLECYSTECTOMY WITH INTRAOPERATIVE CHOLANGIOGRAM;  Surgeon: Donnie Mesa, MD;  Location: [facility];  Service: General;  Laterality: N/A;   DILATION AND CURETTAGE OF UTERUS      ERCP N/A 01/10/2021   Procedure: ENDOSCOPIC RETROGRADE CHOLANGIOPANCREATOGRAPHY (ERCP);  Surgeon: Ladene Artist, MD;  Location[facility];  Service: Endoscopy;  Laterality: N/A;   PORTACATH PLACEMENT  09/12/2020   Procedure: INSERTION PORT-A-CATH WITH ULTRASOUND;  Surgeon: [name], MD;  Location[facility];  Service: General;;   SPHINCTEROTOMY  01/10/2021   Procedure: Joan Mayans;  Surgeon: Ladene Artist, MD;  Location[facility];  Service: Endoscopy;;  balloon sweep   WISDOM TOOTH EXTRACTION  2020   Patient Active Problem List   Diagnosis Date Noted   Genetic testing 08/01/2020   Family history of breast cancer 07/26/2020   Family history of prostate cancer 07/26/2020   Family history of colon cancer 07/26/2020   Malignant neoplasm of lower-outer quadrant of left breast of female, estrogen receptor positive ([place]) 07/20/2020    PCP: Dr. Horald Pollen  REFERRING PROVIDER: Dr. Lindi Adie  REFERRING DIAG: C50.512,Z17.0 (ICD-10-CM) - Malignant neoplasm of lower-outer quadrant of left breast of female, estrogen receptor positive ([place])  THERAPY DIAG:  Aftercare following surgery for neoplasm  Disorder of the skin and subcutaneous tissue related to radiation, unspecified  Malignant neoplasm of lower-outer quadrant of left breast of female, estrogen receptor positive ([place])  Lymphedema, not elsewhere classified  ONSET DATE: 08/16/20  Rationale for Evaluation and Treatment Rehabilitation  SUBJECTIVE:  SUBJECTIVE STATEMENT: It had been under control and then I think it started getting worse.  I started getting shooting pains in the breast Friday night and some more swelling.    PERTINENT HISTORY: Patient was diagnosed on 07/06/2020 with left grade II invasive ductal carcinoma breast cancer.  Patient reports she underwent a left lumpectomy and sentinel node biopsy (1/2 nodes positive) on 08/16/2020. It is ER/PR positive and HER2 negative with a Ki67 of 15%. She has a positive axillary lymph node. Was seen here about 1 year ago for mild breast lymphedema x 1 visit.   PAIN:  Are you having pain? Yes NPRS scale: 2/10 Pain location: left breast later and under the bra line Pain orientation: Left  PAIN TYPE: pinch Pain description: intermittent  Aggravating factors: not sure Relieving factors: when my husband and I do the massage , compression bra   PRECAUTIONS: lt lymphedema risk  WEIGHT BEARING RESTRICTIONS: No  FALLS:  Has patient fallen in last 6 months? No  LIVING ENVIRONMENT: Lives with: lives with their family and lives with their spouse Lives in: House/apartment  OCCUPATION: not working  LEISURE: walking 33mn QD  HAND DOMINANCE: right   PRIOR LEVEL OF FUNCTION: Independent  PATIENT GOALS: is there anything I need to do for the swelling?   OBJECTIVE: PALPATION: Tightness axillary borders, pectoralis, and puffiness axilla, +1 ttp prior to MT to the lateral breast and none after MT, mild fibrosis medial inferior breast  OBSERVATIONS / OTHER ASSESSMENTS: peau d-orange medial breast only constant from radiation  UPPER EXTREMITY AROM/PROM: WNL  CERVICAL AROM: All within normal limits:   L-DEX LYMPHEDEMA SCREENING:  Breast complaints questionnaire: 43/80   TODAY'S TREATMENT:                                                                                                                           DATE:  09/24/22: With pt permission for breast MLD Education on lymphatic anatomy and drainage patterns of the breast and principles of MLD Performed each step in supine and more of a review for pt as she has been here in the past and learned MLD. In supine: Short neck, 5 diaphragmatic breaths, bil axillary nodes and establishment of interaxillary pathway, L inguinal  nodes and establishment of axilloinguinal pathway, then L breast moving fluid towards pathways spending extra time in any areas of fibrosis then retracing all steps.  Made pt foam rectangle for lateral breast using 312m-1 hour to start and then as needed Has compression bra Added and performed open book with elbow bent 3x15" and LTR with hands behind the head    PATIENT EDUCATION:  Education details: new stretches, MLD review Person educated: Patient Education method: ExCustomer service managerducation comprehension: verbalized understanding and returned demonstration  HOME EXERCISE PROGRAM: Access Code: [email or web address] URL: [email or web address] Date: 09/24/2022 Prepared by: KaShan LevansExercises - Sidelying Thoracic Rotation with Open Book  - 1 x daily - 7 x weekly - 1-3 sets -  10 reps - 20-30 seconds hold - Supine Lower Trunk Rotation  - 1 x daily - 7 x weekly - 1-3 sets - 10 reps - 20-30 seconds hold  ASSESSMENT:  CLINICAL IMPRESSION: Patient is a 56 y.o. female who was seen today for physical therapy evaluation and treatment for her chronic left breast lymphedema.  Pt noted a recent exacerbation of her left breast edema and pain last week after possibly moving some heavy things around.  Mild to moderate breast edema lateral breast as well as pectoralis and axillary tightness noted.  Pt will benefit from in clinic session to improve myofascial restrictions.    OBJECTIVE IMPAIRMENTS: increased edema and increased fascial restrictions.   PERSONAL FACTORS: 1-2 comorbidities: radiation hx and SLNB  are also affecting patient's functional outcome.   REHAB POTENTIAL: Good  CLINICAL DECISION MAKING: Stable/uncomplicated  EVALUATION COMPLEXITY: Low  GOALS: Goals reviewed with patient? Yes  SHORT TERM GOALS:= LTGS  Target date: 10/24/22  Pt will decrease breast edema questionnaire to 20 or less Baseline: Goal status: INITIAL  2.  Pt will report no continuing  sharp breast pain Baseline:  Goal status: INITIAL  3.  Pt will return to ind with self care for chronic breast lymphedema  Baseline:  Goal status: INITIAL PLAN:  PT FREQUENCY: 1-2x/week  PT DURATION: 4 weeks  PLANNED INTERVENTIONS: Therapeutic exercises, Therapeutic activity, Neuromuscular re-education, Balance training, Gait training, Patient/Family education, Self Care, Joint mobilization, Manual therapy, and Re-evaluation  PLAN FOR NEXT SESSION: left breast MLD, Lt upper quadrant STM/pectoralis   Laasya Peyton, Adrian Prince, PT 09/24/2022, 10:52 AM

## 2022-09-26 ENCOUNTER — Ambulatory Visit: Payer: 59 | Attending: Hematology and Oncology | Admitting: Rehabilitation

## 2022-09-26 DIAGNOSIS — L599 Disorder of the skin and subcutaneous tissue related to radiation, unspecified: Secondary | ICD-10-CM | POA: Insufficient documentation

## 2022-09-26 DIAGNOSIS — Z170 Estrogen receptor positive status [ER+]: Secondary | ICD-10-CM | POA: Diagnosis present

## 2022-09-26 DIAGNOSIS — G8929 Other chronic pain: Secondary | ICD-10-CM | POA: Insufficient documentation

## 2022-09-26 DIAGNOSIS — Z483 Aftercare following surgery for neoplasm: Secondary | ICD-10-CM | POA: Diagnosis not present

## 2022-09-26 DIAGNOSIS — I890 Lymphedema, not elsewhere classified: Secondary | ICD-10-CM | POA: Insufficient documentation

## 2022-09-26 DIAGNOSIS — R293 Abnormal posture: Secondary | ICD-10-CM | POA: Insufficient documentation

## 2022-09-26 DIAGNOSIS — M25512 Pain in left shoulder: Secondary | ICD-10-CM | POA: Diagnosis present

## 2022-09-26 DIAGNOSIS — M25612 Stiffness of left shoulder, not elsewhere classified: Secondary | ICD-10-CM | POA: Diagnosis present

## 2022-09-26 DIAGNOSIS — C50512 Malignant neoplasm of lower-outer quadrant of left female breast: Secondary | ICD-10-CM | POA: Diagnosis present

## 2022-09-26 NOTE — Therapy (Addendum)
OUTPATIENT PHYSICAL THERAPY ONCOLOGY EVALUATION  Patient Name: Lindsay Hampton MRN: [id number] DOB:05/22/1966, 56 y.o., female Today's Date: 09/26/2022   PT End of Session - 09/26/22 0949     Visit Number 2    Number of Visits 7    Date for PT Re-Evaluation 10/22/22    PT Start Time 0954    PT Stop Time 1048    PT Time Calculation (min) 54 min    Activity Tolerance Patient tolerated treatment well    Behavior During Therapy [facility] for tasks assessed/performed              Past Medical History:  Diagnosis Date   Acute biliary pancreatitis without infection or necrosis    Acute cholecystitis    Allergy    seasonal   Anemia    After childbirth   Anxiety    Breast cancer ([place])    Elevated LFTs    Family history of breast cancer 07/26/2020   Family history of colon cancer 07/26/2020   Family history of prostate cancer 07/26/2020   GERD (gastroesophageal reflux disease)    Hypertensive urgency 01/08/2021   Personal history of chemotherapy    Personal history of radiation therapy    Pneumonia    In 20s   Past Surgical History:  Procedure Laterality Date   AXILLARY LYMPH NODE DISSECTION Left 08/16/2020   Procedure: LEFT TARGETED AXILLARY LYMPH NODE DISSECTION;  Surgeon: [name], MD;  Location: [place];  Service: General;  Laterality: Left;   BREAST LUMPECTOMY Left 10/2021   BREAST LUMPECTOMY WITH RADIOACTIVE SEED AND AXILLARY LYMPH NODE DISSECTION Left 08/16/2020   Procedure: LEFT BREAST LUMPECTOMY WITH RADIOACTIVE SEED;  Surgeon: [name], MD;  Location: [place];  Service: General;  Laterality: Left;  PEC BLOCK   BREAST SURGERY Left 06/2020   breast bx    CERVICAL BIOPSY  W/ LOOP ELECTRODE EXCISION  12/2017   CESAREAN SECTION     CHOLECYSTECTOMY N/A 01/09/2021   Procedure: LAPAROSCOPIC CHOLECYSTECTOMY WITH INTRAOPERATIVE CHOLANGIOGRAM;  Surgeon: Donnie Mesa, MD;  [facility];  Service: General;  Laterality: N/A;   DILATION AND CURETTAGE OF UTERUS      ERCP N/A 01/10/2021   Procedure: ENDOSCOPIC RETROGRADE CHOLANGIOPANCREATOGRAPHY (ERCP);  Surgeon: Ladene Artist, MD;  Location: [facility];  Service: Endoscopy;  Laterality: N/A;   PORTACATH PLACEMENT  09/12/2020   Procedure: INSERTION PORT-A-CATH WITH ULTRASOUND;  Surgeon: [name], MD;  [facility];  Service: General;;   SPHINCTEROTOMY  01/10/2021   Procedure: SPHINCTEROTOMY;  Surgeon: Ladene Artist, MD;  [facility];  Service: Endoscopy;;  balloon sweep   WISDOM TOOTH EXTRACTION  2020   Patient Active Problem List   Diagnosis Date Noted   Genetic testing 08/01/2020   Family history of breast cancer 07/26/2020   Family history of prostate cancer 07/26/2020   Family history of colon cancer 07/26/2020   Malignant neoplasm of lower-outer quadrant of left breast of female, estrogen receptor positive ([place]) 07/20/2020    PCP: Dr. Horald Pollen  REFERRING PROVIDER: Dr. Lindi Adie  REFERRING DIAG: C50.512,Z17.0 (ICD-10-CM) - Malignant neoplasm of lower-outer quadrant of left breast of female, estrogen receptor positive ([place])  THERAPY DIAG:  Aftercare following surgery for neoplasm  Lymphedema, not elsewhere classified  Chronic left shoulder pain  Disorder of the skin and subcutaneous tissue related to radiation, unspecified  Abnormal posture  Stiffness of left shoulder, not elsewhere classified  Malignant neoplasm of lower-outer quadrant of left breast of female, estrogen receptor positive (  [place])  ONSET DATE: 08/16/20  Rationale for Evaluation and Treatment Rehabilitation  SUBJECTIVE:                                                                                                                                                                                           SUBJECTIVE STATEMENT: She has been doing her massage and feels like she has notice some changes. She is wearing the form in her compression bra.  PERTINENT HISTORY: Patient was diagnosed on  07/06/2020 with left grade II invasive ductal carcinoma breast cancer. Patient reports she underwent a left lumpectomy and sentinel node biopsy (1/2 nodes positive) on 08/16/2020. It is ER/PR positive and HER2 negative with a Ki67 of 15%. She has a positive axillary lymph node. Was seen here about 1 year ago for mild breast lymphedema x 1 visit.   PAIN:  Are you having pain? Yes NPRS scale: 2/10 Pain location: left breast later and under the bra line Pain orientation: Left  PAIN TYPE: pinch Pain description: intermittent  Aggravating factors: not sure Relieving factors: when my husband and I do the massage , compression bra   PRECAUTIONS: lt lymphedema risk  WEIGHT BEARING RESTRICTIONS: No  FALLS:  Has patient fallen in last 6 months? No  LIVING ENVIRONMENT: Lives with: lives with their family and lives with their spouse Lives in: House/apartment  OCCUPATION: not working  LEISURE: walking 58mn QD  HAND DOMINANCE: right   PRIOR LEVEL OF FUNCTION: Independent  PATIENT GOALS: is there anything I need to do for the swelling?   OBJECTIVE: PALPATION: Tightness axillary borders, pectoralis, and puffiness axilla, +1 ttp prior to MT to the lateral breast and none after MT, mild fibrosis medial inferior breast  OBSERVATIONS / OTHER ASSESSMENTS: peau d-orange medial breast only constant from radiation  UPPER EXTREMITY AROM/PROM: WNL  CERVICAL AROM: All within normal limits:   L-DEX LYMPHEDEMA SCREENING:  Breast complaints questionnaire: 43/80   TODAY'S TREATMENT:  DATE:  09/26/22:  Performed each step in supine and more of a review for pt as she has been here in the past and learned MLD. In supine: Short neck, 5 diaphragmatic breaths, bil axillary nodes and establishment of interaxillary pathway, L inguinal nodes and establishment of axilloinguinal pathway,  then L breast moving fluid towards pathways spending extra time in any areas of fibrosis then retracing all steps.   09/24/22: With pt permission for breast MLD Education on lymphatic anatomy and drainage patterns of the breast and principles of MLD Performed each step in supine and more of a review for pt as she has been here in the past and learned MLD. In supine: Short neck, 5 diaphragmatic breaths, bil axillary nodes and establishment of interaxillary pathway, L inguinal nodes and establishment of axilloinguinal pathway, then L breast moving fluid towards pathways spending extra time in any areas of fibrosis then retracing all steps.  Made pt foam rectangle for lateral breast using 11mn-1 hour to start and then as needed Has compression bra Added and performed open book with elbow bent 3x15" and LTR with hands behind the head    PATIENT EDUCATION:  Education details: new stretches, MLD review Person educated: Patient Education method: ECustomer service managerEducation comprehension: verbalized understanding and returned demonstration  HOME EXERCISE PROGRAM: Access Code: [email or web address] URL: [email or web address] Date: 09/24/2022 Prepared by: KShan Levans Exercises - Sidelying Thoracic Rotation with Open Book  - 1 x daily - 7 x weekly - 1-3 sets - 10 reps - 20-30 seconds hold - Supine Lower Trunk Rotation  - 1 x daily - 7 x weekly - 1-3 sets - 10 reps - 20-30 seconds hold  ASSESSMENT:  CLINICAL IMPRESSION: Patient responded well to therapy today. Today's session focused on MLD of the left breast and axillary. Patient would benefit from continued therapy to work on swelling of the left breast and axillary for better QOL.  OBJECTIVE IMPAIRMENTS: increased edema and increased fascial restrictions.   PERSONAL FACTORS: 1-2 comorbidities: radiation hx and SLNB  are also affecting patient's functional outcome.   REHAB POTENTIAL: Good  CLINICAL DECISION MAKING:  Stable/uncomplicated  EVALUATION COMPLEXITY: Low  GOALS: Goals reviewed with patient? Yes  SHORT TERM GOALS:= LTGS  Target date: 10/24/22  Pt will decrease breast edema questionnaire to 20 or less Baseline: Goal status: INITIAL  2.  Pt will report no continuing sharp breast pain Baseline:  Goal status: INITIAL  3.  Pt will return to ind with self care for chronic breast lymphedema  Baseline:  Goal status: INITIAL PLAN:  PT FREQUENCY: 1-2x/week  PT DURATION: 4 weeks  PLANNED INTERVENTIONS: Therapeutic exercises, Therapeutic activity, Neuromuscular re-education, Balance training, Gait training, Patient/Family education, Self Care, Joint mobilization, Manual therapy, and Re-evaluation  PLAN FOR NEXT SESSION: left breast MLD, Lt upper quadrant STM/pectoralis, gentle stretching     Masaru Chamberlin, Student-PT 09/26/2022  10:51 AM

## 2022-10-01 ENCOUNTER — Encounter: Payer: Self-pay | Admitting: Rehabilitation

## 2022-10-01 ENCOUNTER — Ambulatory Visit: Payer: 59 | Admitting: Rehabilitation

## 2022-10-01 DIAGNOSIS — I890 Lymphedema, not elsewhere classified: Secondary | ICD-10-CM

## 2022-10-01 DIAGNOSIS — R293 Abnormal posture: Secondary | ICD-10-CM

## 2022-10-01 DIAGNOSIS — Z170 Estrogen receptor positive status [ER+]: Secondary | ICD-10-CM

## 2022-10-01 DIAGNOSIS — Z483 Aftercare following surgery for neoplasm: Secondary | ICD-10-CM | POA: Diagnosis not present

## 2022-10-01 DIAGNOSIS — L599 Disorder of the skin and subcutaneous tissue related to radiation, unspecified: Secondary | ICD-10-CM

## 2022-10-01 DIAGNOSIS — G8929 Other chronic pain: Secondary | ICD-10-CM

## 2022-10-01 DIAGNOSIS — M25612 Stiffness of left shoulder, not elsewhere classified: Secondary | ICD-10-CM

## 2022-10-01 NOTE — Therapy (Signed)
OUTPATIENT PHYSICAL THERAPY ONCOLOGY EVALUATION  Patient Name: Lindsay Hampton MRN: [id number] DOB:1966/08/07, 56 y.o., female Today's Date: 10/01/2022   PT End of Session - 10/01/22 1047     Visit Number 3    Number of Visits 7    Date for PT Re-Evaluation 10/22/22    PT Start Time 4098    PT Stop Time 1191    PT Time Calculation (min) 57 min    Activity Tolerance Patient tolerated treatment well    Behavior During Therapy [facility] for tasks assessed/performed               Past Medical History:  Diagnosis Date   Acute biliary pancreatitis without infection or necrosis    Acute cholecystitis    Allergy    seasonal   Anemia    After childbirth   Anxiety    Breast cancer ([place])    Elevated LFTs    Family history of breast cancer 07/26/2020   Family history of colon cancer 07/26/2020   Family history of prostate cancer 07/26/2020   GERD (gastroesophageal reflux disease)    Hypertensive urgency 01/08/2021   Personal history of chemotherapy    Personal history of radiation therapy    Pneumonia    In 20s   Past Surgical History:  Procedure Laterality Date   AXILLARY LYMPH NODE DISSECTION Left 08/16/2020   Procedure: LEFT TARGETED AXILLARY LYMPH NODE DISSECTION;  Surgeon: [name], MD;  Location: [place];  Service: General;  Laterality: Left;   BREAST LUMPECTOMY Left 10/2021   BREAST LUMPECTOMY WITH RADIOACTIVE SEED AND AXILLARY LYMPH NODE DISSECTION Left 08/16/2020   Procedure: LEFT BREAST LUMPECTOMY WITH RADIOACTIVE SEED;  Surgeon: [name], MD;  Location: [place];  Service: General;  Laterality: Left;  PEC BLOCK   BREAST SURGERY Left 06/2020   breast bx    CERVICAL BIOPSY  W/ LOOP ELECTRODE EXCISION  12/2017   CESAREAN SECTION     CHOLECYSTECTOMY N/A 01/09/2021   Procedure: LAPAROSCOPIC CHOLECYSTECTOMY WITH INTRAOPERATIVE CHOLANGIOGRAM;  Surgeon: Donnie Mesa, MD;  Location: [facility];  Service: General;  Laterality: N/A;   DILATION AND CURETTAGE OF UTERUS      ERCP N/A 01/10/2021   Procedure: ENDOSCOPIC RETROGRADE CHOLANGIOPANCREATOGRAPHY (ERCP);  Surgeon: Ladene Artist, MD;  Location: [facility];  Service: Endoscopy;  Laterality: N/A;   PORTACATH PLACEMENT  09/12/2020   Procedure: INSERTION PORT-A-CATH WITH ULTRASOUND;  Surgeon: [name], MD;  Location: [facility];  Service: General;;   SPHINCTEROTOMY  01/10/2021   Procedure: SPHINCTEROTOMY;  Surgeon: Ladene Artist, MD;  Location[facility];  Service: Endoscopy;;  balloon sweep   WISDOM TOOTH EXTRACTION  2020   Patient Active Problem List   Diagnosis Date Noted   Genetic testing 08/01/2020   Family history of breast cancer 07/26/2020   Family history of prostate cancer 07/26/2020   Family history of colon cancer 07/26/2020   Malignant neoplasm of lower-outer quadrant of left breast of female, estrogen receptor positive ([place]) 07/20/2020    PCP: Dr. Horald Pollen  REFERRING PROVIDER: Dr. Lindi Adie  REFERRING DIAG: C50.512,Z17.0 (ICD-10-CM) - Malignant neoplasm of lower-outer quadrant of left breast of female, estrogen receptor positive ([place])  THERAPY DIAG:  Aftercare following surgery for neoplasm  Disorder of the skin and subcutaneous tissue related to radiation, unspecified  Malignant neoplasm of lower-outer quadrant of left breast of female, estrogen receptor positive ([place])  Lymphedema, not elsewhere classified  Abnormal posture  Chronic left shoulder pain  Stiffness of left shoulder, not  elsewhere classified  ONSET DATE: 08/16/20  Rationale for Evaluation and Treatment Rehabilitation  SUBJECTIVE:                                                                                                                                                                                           SUBJECTIVE STATEMENT: She said that she is has notice some improvements. The swelling under the arm seems like it has going down.   PERTINENT HISTORY: Patient was diagnosed on 07/06/2020  with left grade II invasive ductal carcinoma breast cancer. Patient reports she underwent a left lumpectomy and sentinel node biopsy (1/2 nodes positive) on 08/16/2020. It is ER/PR positive and HER2 negative with a Ki67 of 15%. She has a positive axillary lymph node. Was seen here about 1 year ago for mild breast lymphedema x 1 visit.   PAIN:  Are you having pain? No  PRECAUTIONS: lt lymphedema risk  WEIGHT BEARING RESTRICTIONS: No  FALLS:  Has patient fallen in last 6 months? No  LIVING ENVIRONMENT: Lives with: lives with their family and lives with their spouse Lives in: House/apartment  OCCUPATION: not working  LEISURE: walking 73mn QD  HAND DOMINANCE: right   PRIOR LEVEL OF FUNCTION: Independent  PATIENT GOALS: is there anything I need to do for the swelling?   OBJECTIVE: PALPATION: Tightness axillary borders, pectoralis, and puffiness axilla, +1 ttp prior to MT to the lateral breast and none after MT, mild fibrosis medial inferior breast  OBSERVATIONS / OTHER ASSESSMENTS: peau d-orange medial breast only constant from radiation  UPPER EXTREMITY AROM/PROM: WNL  CERVICAL AROM: All within normal limits:   L-DEX LYMPHEDEMA SCREENING:  Breast complaints questionnaire: 43/80   TODAY'S TREATMENT:                                                                                                                           DATE:  10/01/2022:  Pulleys flexion and abduction 2 min Ball flexion and abduction 1x10  Side lying abduction 1x5  Performed each step in supine and more of a review for pt as she has been here in  the past and learned MLD. In supine: Short neck, 5 diaphragmatic breaths, bil axillary nodes and establishment of interaxillary pathway, L inguinal nodes and establishment of axilloinguinal pathway, then L breast moving fluid towards pathways spending extra time in any areas of fibrosis then retracing all steps. Then posterior interaxillary work in right  sidelying.   09/26/22:  Performed each step in supine and more of a review for pt as she has been here in the past and learned MLD. In supine: Short neck, 5 diaphragmatic breaths, bil axillary nodes and establishment of interaxillary pathway, L inguinal nodes and establishment of axilloinguinal pathway, then L breast moving fluid towards pathways spending extra time in any areas of fibrosis then retracing all steps.   09/24/22: With pt permission for breast MLD Education on lymphatic anatomy and drainage patterns of the breast and principles of MLD Performed each step in supine and more of a review for pt as she has been here in the past and learned MLD. In supine: Short neck, 5 diaphragmatic breaths, bil axillary nodes and establishment of interaxillary pathway, L inguinal nodes and establishment of axilloinguinal pathway, then L breast moving fluid towards pathways spending extra time in any areas of fibrosis then retracing all steps.  Made pt foam rectangle for lateral breast using 66mn-1 hour to start and then as needed Has compression bra Added and performed open book with elbow bent 3x15" and LTR with hands behind the head    PATIENT EDUCATION:  Education details: new stretches, MLD review Person educated: Patient Education method: ECustomer service managerEducation comprehension: verbalized understanding and returned demonstration  HOME EXERCISE PROGRAM: Access Code: [email or web address] URL: [email or web address] Date: 09/24/2022 Prepared by: KShan Levans Exercises - Sidelying Thoracic Rotation with Open Book  - 1 x daily - 7 x weekly - 1-3 sets - 10 reps - 20-30 seconds hold - Supine Lower Trunk Rotation  - 1 x daily - 7 x weekly - 1-3 sets - 10 reps - 20-30 seconds hold  ASSESSMENT:  CLINICAL IMPRESSION: Patient responded well to therapy today. Today's session focused on MLD of the left breast and axillary. Today we started adding gentle stretching and patient felt  relief of tightness. Patient would benefit from continued therapy to work on swelling of the left breast and axillary for better QOL.  OBJECTIVE IMPAIRMENTS: increased edema and increased fascial restrictions.   PERSONAL FACTORS: 1-2 comorbidities: radiation hx and SLNB  are also affecting patient's functional outcome.   REHAB POTENTIAL: Good  CLINICAL DECISION MAKING: Stable/uncomplicated  EVALUATION COMPLEXITY: Low  GOALS: Goals reviewed with patient? Yes  SHORT TERM GOALS:= LTGS  Target date: 10/24/22 Updated: 10/01/2022   Pt will decrease breast edema questionnaire to 20 or less Baseline: Goal status: INITIAL  2.  Pt will report no continuing sharp breast pain Baseline: reports no sharp breast pain Goal status: MET  3.  Pt will return to ind with self care for chronic breast lymphedema  Baseline:  Goal status: INITIAL PLAN:  PT FREQUENCY: 1-2x/week  PT DURATION: 4 weeks  PLANNED INTERVENTIONS: Therapeutic exercises, Therapeutic activity, Neuromuscular re-education, Balance training, Gait training, Patient/Family education, Self Care, Joint mobilization, Manual therapy, and Re-evaluation  PLAN FOR NEXT SESSION: continue left breast MLD, Lt upper quadrant STM/pectoralis, gentle stretching     Reeve Mallo, Student-PT 10/01/2022  11:48 AM

## 2022-10-03 ENCOUNTER — Encounter: Payer: Self-pay | Admitting: Rehabilitation

## 2022-10-03 ENCOUNTER — Ambulatory Visit: Payer: 59 | Admitting: Rehabilitation

## 2022-10-03 DIAGNOSIS — Z170 Estrogen receptor positive status [ER+]: Secondary | ICD-10-CM

## 2022-10-03 DIAGNOSIS — I890 Lymphedema, not elsewhere classified: Secondary | ICD-10-CM

## 2022-10-03 DIAGNOSIS — Z483 Aftercare following surgery for neoplasm: Secondary | ICD-10-CM

## 2022-10-03 DIAGNOSIS — L599 Disorder of the skin and subcutaneous tissue related to radiation, unspecified: Secondary | ICD-10-CM

## 2022-10-03 NOTE — Therapy (Addendum)
OUTPATIENT PHYSICAL THERAPY ONCOLOGY TREATMENT  Patient Name: Lindsay Hampton MRN: [id number] DOB:26-Sep-1966, 56 y.o., female Today's Date: 10/03/2022   PT End of Session - 10/03/22 1050     Visit Number 4    Number of Visits 7    Date for PT Re-Evaluation 10/22/22    PT Start Time 1100    PT Stop Time 4034    PT Time Calculation (min) 45 min    Activity Tolerance Patient tolerated treatment well    Behavior During Therapy [facility] for tasks assessed/performed                Past Medical History:  Diagnosis Date   Acute biliary pancreatitis without infection or necrosis    Acute cholecystitis    Allergy    seasonal   Anemia    After childbirth   Anxiety    Breast cancer ([place])    Elevated LFTs    Family history of breast cancer 07/26/2020   Family history of colon cancer 07/26/2020   Family history of prostate cancer 07/26/2020   GERD (gastroesophageal reflux disease)    Hypertensive urgency 01/08/2021   Personal history of chemotherapy    Personal history of radiation therapy    Pneumonia    In 20s   Past Surgical History:  Procedure Laterality Date   AXILLARY LYMPH NODE DISSECTION Left 08/16/2020   Procedure: LEFT TARGETED AXILLARY LYMPH NODE DISSECTION;  Surgeon: [name], MD;  Location: [place];  Service: General;  Laterality: Left;   BREAST LUMPECTOMY Left 10/2021   BREAST LUMPECTOMY WITH RADIOACTIVE SEED AND AXILLARY LYMPH NODE DISSECTION Left 08/16/2020   Procedure: LEFT BREAST LUMPECTOMY WITH RADIOACTIVE SEED;  Surgeon: [name], MD;  Location: [place];  Service: General;  Laterality: Left;  PEC BLOCK   BREAST SURGERY Left 06/2020   breast bx    CERVICAL BIOPSY  W/ LOOP ELECTRODE EXCISION  12/2017   CESAREAN SECTION     CHOLECYSTECTOMY N/A 01/09/2021   Procedure: LAPAROSCOPIC CHOLECYSTECTOMY WITH INTRAOPERATIVE CHOLANGIOGRAM;  Surgeon: Donnie Mesa, MD;  [facility];  Service: General;  Laterality: N/A;   DILATION AND CURETTAGE OF UTERUS      ERCP N/A 01/10/2021   Procedure: ENDOSCOPIC RETROGRADE CHOLANGIOPANCREATOGRAPHY (ERCP);  Surgeon: Ladene Artist, MD;  Location[facility];  Service: Endoscopy;  Laterality: N/A;   PORTACATH PLACEMENT  09/12/2020   Procedure: INSERTION PORT-A-CATH WITH ULTRASOUND;  Surgeon: [name], MD;  [facility];  Service: General;;   SPHINCTEROTOMY  01/10/2021   Procedure: Joan Mayans;  Surgeon: Ladene Artist, MD;  [facility];  Service: Endoscopy;;  balloon sweep   WISDOM TOOTH EXTRACTION  2020   Patient Active Problem List   Diagnosis Date Noted   Genetic testing 08/01/2020   Family history of breast cancer 07/26/2020   Family history of prostate cancer 07/26/2020   Family history of colon cancer 07/26/2020   Malignant neoplasm of lower-outer quadrant of left breast of female, estrogen receptor positive ([place]) 07/20/2020    PCP: Dr. Horald Pollen  REFERRING PROVIDER: Dr. Lindi Adie  REFERRING DIAG: C50.512,Z17.0 (ICD-10-CM) - Malignant neoplasm of lower-outer quadrant of left breast of female, estrogen receptor positive ([place])  THERAPY DIAG:  Aftercare following surgery for neoplasm  Disorder of the skin and subcutaneous tissue related to radiation, unspecified  Malignant neoplasm of lower-outer quadrant of left breast of female, estrogen receptor positive ([place])  Lymphedema, not elsewhere classified  ONSET DATE: 08/16/20  Rationale for Evaluation and Treatment Rehabilitation  SUBJECTIVE:  SUBJECTIVE STATEMENT: I am really feeling back to normal   PERTINENT HISTORY: Patient was diagnosed on 07/06/2020 with left grade II invasive ductal carcinoma breast cancer. Patient reports she underwent a left lumpectomy and sentinel node biopsy (1/2 nodes positive) on 08/16/2020. It is ER/PR  positive and HER2 negative with a Ki67 of 15%. She has a positive axillary lymph node. Was seen here about 1 year ago for mild breast lymphedema x 1 visit.   PAIN:  Are you having pain? No  PRECAUTIONS: lt lymphedema risk  WEIGHT BEARING RESTRICTIONS: No  FALLS:  Has patient fallen in last 6 months? No  LIVING ENVIRONMENT: Lives with: lives with their family and lives with their spouse Lives in: House/apartment  OCCUPATION: not working  LEISURE: walking 24mn QD  HAND DOMINANCE: right   PRIOR LEVEL OF FUNCTION: Independent  PATIENT GOALS: is there anything I need to do for the swelling?   OBJECTIVE: PALPATION: Tightness axillary borders, pectoralis, and puffiness axilla, +1 ttp prior to MT to the lateral breast and none after MT, mild fibrosis medial inferior breast  OBSERVATIONS / OTHER ASSESSMENTS: peau d-orange medial breast only constant from radiation  UPPER EXTREMITY AROM/PROM: WNL  CERVICAL AROM: All within normal limits:   L-DEX LYMPHEDEMA SCREENING:  Breast complaints questionnaire: 43/80   TODAY'S TREATMENT:                                                                                                                           DATE:  10/03/2022:  Pulleys flexion and abduction 2 min Ball flexion and abduction 1x10  Performed each step in supine and more of a review for pt as she has been here in the past and learned MLD. In supine: Short neck, 5 diaphragmatic breaths, bil axillary nodes and establishment of interaxillary pathway, L inguinal nodes and establishment of axilloinguinal pathway, then L breast moving fluid towards pathways spending extra time in any areas of fibrosis then retracing all steps. Then posterior interaxillary work in right sidelying. STM Left pectoralis, latissimus, and rhomboids without significant tightness.   10/01/2022:  Pulleys flexion and abduction 2 min Ball flexion and abduction 1x10  Side lying abduction 1x5  Performed each  step in supine and more of a review for pt as she has been here in the past and learned MLD. In supine: Short neck, 5 diaphragmatic breaths, bil axillary nodes and establishment of interaxillary pathway, L inguinal nodes and establishment of axilloinguinal pathway, then L breast moving fluid towards pathways spending extra time in any areas of fibrosis then retracing all steps. Then posterior interaxillary work in right sidelying.  09/26/22:  Performed each step in supine and more of a review for pt as she has been here in the past and learned MLD. In supine: Short neck, 5 diaphragmatic breaths, bil axillary nodes and establishment of interaxillary pathway, L inguinal nodes and establishment of axilloinguinal pathway, then L breast moving fluid towards pathways spending extra time in any areas  of fibrosis then retracing all steps.   09/24/22: With pt permission for breast MLD Education on lymphatic anatomy and drainage patterns of the breast and principles of MLD Performed each step in supine and more of a review for pt as she has been here in the past and learned MLD. In supine: Short neck, 5 diaphragmatic breaths, bil axillary nodes and establishment of interaxillary pathway, L inguinal nodes and establishment of axilloinguinal pathway, then L breast moving fluid towards pathways spending extra time in any areas of fibrosis then retracing all steps.  Made pt foam rectangle for lateral breast using 82mn-1 hour to start and then as needed Has compression bra Added and performed open book with elbow bent 3x15" and LTR with hands behind the head    PATIENT EDUCATION:  Education details: new stretches, MLD review Person educated: Patient Education method: ECustomer service managerEducation comprehension: verbalized understanding and returned demonstration  HOME EXERCISE PROGRAM: Access Code: [email or web address] URL: [email or web address] Date: 09/24/2022 Prepared by: KShan Levans Exercises - Sidelying Thoracic Rotation with Open Book  - 1 x daily - 7 x weekly - 1-3 sets - 10 reps - 20-30 seconds hold - Supine Lower Trunk Rotation  - 1 x daily - 7 x weekly - 1-3 sets - 10 reps - 20-30 seconds hold  ASSESSMENT:  CLINICAL IMPRESSION: Pt is ready for DC with all goals met.  She has not noticed any breast pain lately and feels ind with self care.    OBJECTIVE IMPAIRMENTS: increased edema and increased fascial restrictions.   PERSONAL FACTORS: 1-2 comorbidities: radiation hx and SLNB  are also affecting patient's functional outcome.   REHAB POTENTIAL: Good  CLINICAL DECISION MAKING: Stable/uncomplicated  EVALUATION COMPLEXITY: Low  GOALS: Goals reviewed with patient? Yes  SHORT TERM GOALS:= LTGS  Target date: 10/24/22 Updated: 10/01/2022   Pt will decrease breast edema questionnaire to 20 or less Baseline: Goal status:  DEFERRED  2.  Pt will report no continuing sharp breast pain Baseline: reports no sharp breast pain Goal status: MET  3.  Pt will return to ind with self care for chronic breast lymphedema  Baseline:  Goal status: MET PLAN:  PT FREQUENCY: 1-2x/week  PT DURATION: 4 weeks  PLANNED INTERVENTIONS: Therapeutic exercises, Therapeutic activity, Neuromuscular re-education, Balance training, Gait training, Patient/Family education, Self Care, Joint mobilization, Manual therapy, and Re-evaluation  PLAN FOR NEXT SESSION: continue left breast MLD, Lt upper quadrant STM/pectoralis, gentle stretching     KShan Levans PT  10/03/2022  4:02 PM   PHYSICAL THERAPY DISCHARGE SUMMARY  Visits from Start of Care: 4  Current functional level related to goals / functional outcomes: See above    Remaining deficits: Lymphedema risk    Education / Equipment: Final HEP  Plan: Patient agrees to discharge.  Patient is being discharged due to meeting the stated rehab goals.

## 2022-10-10 ENCOUNTER — Encounter: Payer: 59 | Admitting: Rehabilitation

## 2022-10-10 ENCOUNTER — Other Ambulatory Visit: Payer: Self-pay | Admitting: Hematology and Oncology

## 2022-10-10 DIAGNOSIS — F4322 Adjustment disorder with anxiety: Secondary | ICD-10-CM

## 2022-10-16 ENCOUNTER — Encounter: Payer: 59 | Admitting: Rehabilitation

## 2022-10-28 ENCOUNTER — Ambulatory Visit
Admission: RE | Admit: 2022-10-28 | Discharge: 2022-10-28 | Disposition: A | Discharge status: Home / Self Care | Payer: 59 | Source: Ambulatory Visit | Attending: Hematology and Oncology | Admitting: Hematology and Oncology

## 2022-10-28 DIAGNOSIS — Z853 Personal history of malignant neoplasm of breast: Secondary | ICD-10-CM

## 2022-12-25 NOTE — Assessment & Plan Note (Signed)
08/16/2020:Left lumpectomy Lindsay Hampton): IDC, grade 2, 2.3cm, with intermediate grade DCIS, clear margins, 1/2 left axillary lymph nodes positive for carcinoma.HER-2 equivocal by IHC (2+), negative by FISH, ER+ 95%, PR+40%, Ki67 15%.  T2 N1 M0 stage Ib MammaPrint: High risk (average 10-year risk untreated: 29%, predicted benefit of chemo with hormone therapy at 5 years: 94.6%   Treatment plan:  1. Adjuvant chemotherapy with dose dense Adriamycin and Cytoxan x4 followed by Taxol  completed 02/19/21 2. Followed by adjuvant radiation 04/06/21- 05/22/21 3. Followed by adjuvant antiestrogen therapy with Anastrozole started 05/2021 ------------------------------------------------------------------------------------------------------------------------------------ Anastrozole daily, tolerating well Occasional mild hot flashes and mild joint stiffness   Breast Cancer Surveillance: Mammogram 10/28/2022: Benign: Breast density category B Breast Exam: 12/26/2022: Benign   Patient is interested in wider clinical trial with Ribociclib.  We will get back in touch with her once the trial starts.  RTC in 1 year

## 2022-12-26 ENCOUNTER — Inpatient Hospital Stay: Payer: 59 | Attending: Hematology and Oncology | Admitting: Hematology and Oncology

## 2022-12-26 ENCOUNTER — Encounter: Payer: Self-pay | Admitting: Gastroenterology

## 2022-12-26 ENCOUNTER — Other Ambulatory Visit: Payer: Self-pay

## 2022-12-26 VITALS — BP 128/95 | HR 98 | Temp 97.7°F | Resp 16 | Wt 234.5 lb

## 2022-12-26 DIAGNOSIS — Z170 Estrogen receptor positive status [ER+]: Secondary | ICD-10-CM

## 2022-12-26 DIAGNOSIS — C50512 Malignant neoplasm of lower-outer quadrant of left female breast: Secondary | ICD-10-CM

## 2022-12-26 DIAGNOSIS — Z79811 Long term (current) use of aromatase inhibitors: Secondary | ICD-10-CM | POA: Diagnosis not present

## 2022-12-26 DIAGNOSIS — Z780 Asymptomatic menopausal state: Secondary | ICD-10-CM

## 2022-12-26 NOTE — Progress Notes (Signed)
Patient Care Team: [name], MD as PCP - General (Family Medicine) [name], MD as Consulting Physician (Hematology and Oncology) Kyung Rudd, MD as Consulting Physician (Radiation Oncology)  DIAGNOSIS:  Encounter Diagnoses  Name Primary?   Malignant neoplasm of lower-outer quadrant of left breast of female, estrogen receptor positive ([place]) Yes   Postmenopausal     SUMMARY OF ONCOLOGIC HISTORY: Oncology History  Malignant neoplasm of lower-outer quadrant of left breast of female, estrogen receptor positive ([place])  07/20/2020 Initial Diagnosis   Screening mammogram detected left breast calcifications, enlarged left axillary lymph nodes, and an asymmetry in the right breast. Diagnostic mammogram showed in the left breast, a 2.4cm mass with calcifications, 3:30 position, a left axillary lymph node with cortical thickness, and in the right breast, no suspicious masses or calcifications. Left breast biopsy showed invasive and in situ mammary carcinoma in the breast and axilla, grade 2, HER-2 equivocal by IHC (2+), negative by FISH, ER+ 95%, PR+40%, Ki67 15%.    07/26/2020 Cancer Staging   Staging form: Breast, AJCC 8th Edition - Clinical stage from 07/26/2020: Stage IIA (cT2, cN1(f), cM0, G2, ER+, PR+, HER2-) - Signed by [name], MD on 07/26/2020   08/01/2020 Genetic Testing   No pathogenic variants detected in [facility] Multi-Cancer Panel.  The Multi-Cancer Panel offered by [facility] includes sequencing and/or deletion duplication testing of the following 85 genes: AIP, ALK, APC, ATM, AXIN2,BAP1,  BARD1, BLM, BMPR1A, BRCA1, BRCA2, BRIP1, CASR, CDC73, CDH1, CDK4, CDKN1B, CDKN1C, CDKN2A (p14ARF), CDKN2A (p16INK4a), CEBPA, CHEK2, CTNNA1, DICER1, DIS3L2, EGFR (c.2369C>T, p.Thr790Met variant only), EPCAM (Deletion/duplication testing only), FH, FLCN, GATA2, GPC3, GREM1 (Promoter region deletion/duplication testing only), HOXB13 (c.251G>A, p.Gly84Glu), HRAS, KIT, MAX, MEN1, MET, MITF (c.952G>A,  p.Glu318Lys variant only), MLH1, MSH2, MSH3, MSH6, MUTYH, NBN, NF1, NF2, NTHL1, PALB2, PDGFRA, PHOX2B, PMS2, POLD1, POLE, POT1, PRKAR1A, PTCH1, PTEN, RAD50, RAD51C, RAD51D, RB1, RECQL4, RET, RNF43, RUNX1, SDHAF2, SDHA (sequence changes only), SDHB, SDHC, SDHD, SMAD4, SMARCA4, SMARCB1, SMARCE1, STK11, SUFU, TERC, TERT, TMEM127, TP53, TSC1, TSC2, VHL, WRN and WT1.  The report date is August 01, 2020.    08/16/2020 Surgery   Left lumpectomy [name]): IDC, grade 2, 2.3cm, with intermediate grade DCIS, clear margins, 1/2 left axillary lymph nodes positive for carcinoma.HER-2 equivocal by IHC (2+), negative by FISH, ER+ 95%, PR+40%, Ki67 15%.    08/24/2020 Cancer Staging   Staging form: Breast, AJCC 8th Edition - Pathologic stage from 08/24/2020: Stage IB (pT2, pN1a, cM0, G2, ER+, PR+, HER2-) - Signed by [name], MD on 08/24/2020   08/30/2020 Miscellaneous   MammaPrint high risk   09/13/2020 - 02/19/2021 Adjuvant Chemotherapy   Adriamycin/Cytoxan x 4 cycles followed by Taxol weekly x 12 (dose reduced cycles 5-12)   04/05/2021 - 05/22/2021 Radiation Therapy   Site Technique Total Dose (Gy) Dose per Fx (Gy) Completed Fx Beam Energies  Breast, Left: Breast_Lt 3D 50.4/50.4 1.8 28/28 10X  Breast, Left: Breast_Lt_SCLV 3D 50.4/50.4 1.8 28/28 6X, 10X  Breast, Left: Breast_Lt_Bst 3D 10/10 2 5/5 6X, 10X      05/29/2021 -  Anti-estrogen oral therapy   Anastrozole daily     CHIEF COMPLIANT: Follow-up of left breast cancer   INTERVAL HISTORY: Lindsay Hampton is a 57 y.o. with above-mentioned history of left breast cancer having undergone lumpectomy, currently on antiestrogen therapy with anastrozole. She presents to the clinic today for follow-up. She is tolerating the anastrozole extremely well. She has some manageable hot flashes. She denies any pain or discomfort in pain  breast. She does have some mild joint stiffness, but manageable.   ALLERGIES:  has No Known Allergies.  MEDICATIONS:  Current  Outpatient Medications  Medication Sig Dispense Refill   acetaminophen (TYLENOL) 325 MG tablet You can take 2 tablets every 4 hours as needed for pain.  Do not take over 4000 mg of Tylenol/acetaminophen per day.  Tylenol#3 is Tylenol and codeine.  You need to count each of these tablets as part of your daily maximum of Tylenol.     ALPRAZolam (XANAX) 0.25 MG tablet TAKE 1 TABLET BY MOUTH TWICE A DAY AS NEEDED FOR ANXIETY 60 tablet 3   anastrozole (ARIMIDEX) 1 MG tablet Take 1 tablet (1 mg total) by mouth daily. 90 tablet 3   Biotin 1 MG CAPS Take 1 capsule by mouth daily. 30 capsule    Calcium Carbonate-Vit D-Min (CALCIUM 1200 PO) Take by mouth daily.     Calcium Citrate 1040 MG TABS Take 1 tablet (1,040 mg total) by mouth daily.  0   vitamin C (ASCORBIC ACID) 500 MG tablet Take 500 mg by mouth daily.     VITAMIN D PO Take 5,000 Units by mouth daily.     Vitamin D, Cholecalciferol, 25 MCG (1000 UT) TABS Take 1 tablet by mouth daily. 60 tablet    No current facility-administered medications for this visit.    PHYSICAL EXAMINATION: ECOG PERFORMANCE STATUS: 1 - Symptomatic but completely ambulatory  Vitals:   12/26/22 1027  BP: (!) 128/95  Pulse: 98  Resp: 16  Temp: 97.7 F (36.5 C)  SpO2: 99%   Filed Weights   12/26/22 1027  Weight: 234 lb 8 oz (106.4 kg)    BREAST: No palpable masses or nodules in either right or left breasts. No palpable axillary supraclavicular or infraclavicular adenopathy no breast tenderness or nipple discharge. (exam performed in the presence of a chaperone)  LABORATORY DATA:  I have reviewed the data as listed    Latest Ref Rng & Units 04/26/2021   10:51 AM 02/19/2021   11:25 AM 02/12/2021    8:45 AM  CMP  Glucose 70 - 99 mg/dL 91  131  113   BUN 6 - 20 mg/dL '14  9  12   '$ Creatinine 0.44 - 1.00 mg/dL 0.72  0.72  0.78   Sodium 135 - 145 mmol/L 140  139  135   Potassium 3.5 - 5.1 mmol/L 4.2  4.2  4.0   Chloride 98 - 111 mmol/L 106  106  104   CO2 22 -  32 mmol/L '24  23  24   '$ Calcium 8.9 - 10.3 mg/dL 10.1  9.3  10.4   Total Protein 6.5 - 8.1 g/dL 7.4  6.9  7.3   Total Bilirubin 0.3 - 1.2 mg/dL 0.6  0.5  0.6   Alkaline Phos 38 - 126 U/L 75  62  66   AST 15 - 41 U/L 22  25  34   ALT 0 - 44 U/L 22  35  44     Lab Results  Component Value Date   WBC 6.2 04/26/2021   HGB 12.8 04/26/2021   HCT 39.2 04/26/2021   MCV 88.9 04/26/2021   PLT 279 04/26/2021   NEUTROABS 4.4 04/26/2021    ASSESSMENT & PLAN:  Malignant neoplasm of lower-outer quadrant of left breast of female, estrogen receptor positive ([place]) 08/16/2020:Left lumpectomy [name]): IDC, grade 2, 2.3cm, with intermediate grade DCIS, clear margins, 1/2 left axillary lymph nodes positive for carcinoma.HER-2  equivocal by IHC (2+), negative by FISH, ER+ 95%, PR+40%, Ki67 15%.  T2 N1 M0 stage Ib MammaPrint: High risk (average 10-year risk untreated: 29%, predicted benefit of chemo with hormone therapy at 5 years: 94.6%   Treatment plan:  1. Adjuvant chemotherapy with dose dense Adriamycin and Cytoxan x4 followed by Taxol  completed 02/19/21 2. Followed by adjuvant radiation 04/06/21- 05/22/21 3. Followed by adjuvant antiestrogen therapy with Anastrozole started 05/2021 ------------------------------------------------------------------------------------------------------------------------------------ Anastrozole daily, tolerating well Occasional mild hot flashes and mild joint stiffness   Breast Cancer Surveillance: Mammogram 10/28/2022: Benign: Breast density category B Breast Exam: 12/26/2022: Benign   Patient is interested in wider clinical trial with Ribociclib.  We will get back in touch with her once the trial starts. Will order bone density test in December  RTC in 1 year    Orders Placed This Encounter  Procedures   DG Bone Density    Standing Status:   Future    Standing Expiration Date:   12/26/2023    Order Specific Question:   Reason for Exam (SYMPTOM  OR DIAGNOSIS  REQUIRED)    Answer:   [email or web address]    Order Specific Question:   Is patient pregnant?    Answer:   No    Order Specific Question:   Preferred imaging location?    Answer:   [facility]   The patient has a good understanding of the overall plan. she agrees with it. she will call with any problems that may develop before the next visit here. Total time spent: 30 mins including face to face time and time spent for planning, charting and co-ordination of care   Harriette Ohara, MD 12/26/22    I Gardiner Coins am acting as a Education administrator for [employer]  I have reviewed the above documentation for accuracy and completeness, and I agree with the above.

## 2023-01-24 ENCOUNTER — Other Ambulatory Visit: Payer: Self-pay | Admitting: Hematology and Oncology

## 2023-02-18 ENCOUNTER — Encounter: Payer: Self-pay | Admitting: Gastroenterology

## 2023-02-18 ENCOUNTER — Ambulatory Visit (AMBULATORY_SURGERY_CENTER): Payer: 59

## 2023-02-18 VITALS — Ht 66.0 in | Wt 230.0 lb

## 2023-02-18 DIAGNOSIS — Z86010 Personal history of colonic polyps: Secondary | ICD-10-CM

## 2023-02-18 MED ORDER — NA SULFATE-K SULFATE-MG SULF 17.5-3.13-1.6 GM/177ML PO SOLN: 1.0000 | Freq: Once | ORAL | 0 refills | Status: AC

## 2023-02-18 NOTE — Progress Notes (Signed)

## 2023-03-04 ENCOUNTER — Ambulatory Visit (AMBULATORY_SURGERY_CENTER): Payer: 59 | Admitting: Gastroenterology

## 2023-03-04 ENCOUNTER — Encounter: Payer: Self-pay | Admitting: Gastroenterology

## 2023-03-04 VITALS — BP 162/81 | HR 71 | Temp 98.2°F | Resp 10 | Ht 66.0 in | Wt 230.0 lb

## 2023-03-04 DIAGNOSIS — Z86010 Personal history of colonic polyps: Secondary | ICD-10-CM | POA: Diagnosis not present

## 2023-03-04 DIAGNOSIS — K635 Polyp of colon: Secondary | ICD-10-CM | POA: Diagnosis not present

## 2023-03-04 DIAGNOSIS — Z09 Encounter for follow-up examination after completed treatment for conditions other than malignant neoplasm: Secondary | ICD-10-CM

## 2023-03-04 DIAGNOSIS — D122 Benign neoplasm of ascending colon: Secondary | ICD-10-CM | POA: Diagnosis not present

## 2023-03-04 MED ORDER — SODIUM CHLORIDE 0.9 % IV SOLN: 500.0000 mL | INTRAVENOUS | Status: DC

## 2023-03-04 NOTE — Progress Notes (Signed)
Sedate, gd SR's, VSS, report to RN 

## 2023-03-04 NOTE — Progress Notes (Signed)
Called to room to assist during endoscopic procedure.  Patient ID and intended procedure confirmed with present staff. Received instructions for my participation in the procedure from the performing physician.  

## 2023-03-04 NOTE — Patient Instructions (Signed)
YOU HAD AN ENDOSCOPIC PROCEDURE TODAY AT THE [facility]:   Refer to the procedure report that was given to you for any specific questions about what was found during the examination.  If the procedure report does not answer your questions, please call your gastroenterologist to clarify.  If you requested that your care partner not be given the details of your procedure findings, then the procedure report has been included in a sealed envelope for you to review at your convenience later.  YOU SHOULD EXPECT: Some feelings of bloating in the abdomen. Passage of more gas than usual.  Walking can help get rid of the air that was put into your GI tract during the procedure and reduce the bloating. If you had a lower endoscopy (such as a colonoscopy or flexible sigmoidoscopy) you may notice spotting of blood in your stool or on the toilet paper. If you underwent a bowel prep for your procedure, you may not have a normal bowel movement for a few days.  Please Note:  You might notice some irritation and congestion in your nose or some drainage.  This is from the oxygen used during your procedure.  There is no need for concern and it should clear up in a day or so.  SYMPTOMS TO REPORT IMMEDIATELY:  Following lower endoscopy (colonoscopy or flexible sigmoidoscopy):  Excessive amounts of blood in the stool  Significant tenderness or worsening of abdominal pains  Swelling of the abdomen that is new, acute  Fever of 100F or higher  For urgent or emergent issues, a gastroenterologist can be reached at any hour by calling [phone]. Do not use MyChart messaging for urgent concerns.    DIET:  We do recommend a small meal at first, but then you may proceed to your regular diet.  Drink plenty of fluids but you should avoid alcoholic beverages for 24 hours.  ACTIVITY:  You should plan to take it easy for the rest of today and you should NOT DRIVE or use heavy machinery until tomorrow (because of  the sedation medicines used during the test).    FOLLOW UP: Our staff will call the number listed on your records the next business day following your procedure.  We will call around 7:15- 8:00 am to check on you and address any questions or concerns that you may have regarding the information given to you following your procedure. If we do not reach you, we will leave a message.     If any biopsies were taken you will be contacted by phone or by letter within the next 1-3 weeks.  Please call us at [phone] if you have not heard about the biopsies in 3 weeks.    SIGNATURES/CONFIDENTIALITY: You and/or your care partner have signed paperwork which will be entered into your electronic medical record.  These signatures attest to the fact that that the information above on your After Visit Summary has been reviewed and is understood.  Full responsibility of the confidentiality of this discharge information lies with you and/or your care-partner.  

## 2023-03-04 NOTE — Progress Notes (Signed)
[facility] History and Physical   Primary Care Physician:  Lindsay Davenportichter, Karen L, MD   Reason for Procedure:  History of adenomatous colon polyps  Plan:    Surveillance colonoscopy with possible interventions as needed     HPI: Lindsay Hampton is a very pleasant 57 y.o. female here for surveillance colonoscopy. Denies any nausea, vomiting, abdominal pain, melena or bright red blood per rectum  The risks and benefits as well as alternatives of endoscopic procedure(s) have been discussed and reviewed. All questions answered. The patient agrees to proceed.    Past Medical History:  Diagnosis Date   Acute biliary pancreatitis without infection or necrosis    Acute cholecystitis    Allergy    seasonal   Anemia    After childbirth   Anxiety    Breast cancer    Elevated LFTs    Family history of breast cancer 07/26/2020   Family history of colon cancer 07/26/2020   Family history of prostate cancer 07/26/2020   GERD (gastroesophageal reflux disease)    Hypertensive urgency 01/08/2021   Personal history of chemotherapy    Personal history of radiation therapy    Pneumonia    In 20s    Past Surgical History:  Procedure Laterality Date   AXILLARY LYMPH NODE DISSECTION Left 08/16/2020   Procedure: LEFT TARGETED AXILLARY LYMPH NODE DISSECTION;  Surgeon: Lindsay KinNewman, David, MD;  [facility];  Service: General;  Laterality: Left;   BREAST BIOPSY Left 10/31/2021   BREAST LUMPECTOMY Left 07/2020   BREAST LUMPECTOMY WITH RADIOACTIVE SEED AND AXILLARY LYMPH NODE DISSECTION Left 08/16/2020   Procedure: LEFT BREAST LUMPECTOMY WITH RADIOACTIVE SEED;  Surgeon: Lindsay KinNewman, David, MD;  [facility];  Service: General;  Laterality: Left;  PEC BLOCK   BREAST SURGERY Left 06/2020   breast bx    CERVICAL BIOPSY  W/ LOOP ELECTRODE EXCISION  12/2017   CESAREAN SECTION     CHOLECYSTECTOMY N/A 01/09/2021   Procedure: LAPAROSCOPIC CHOLECYSTECTOMY WITH INTRAOPERATIVE CHOLANGIOGRAM;   Surgeon: Lindsay Ruddsuei, Matthew, MD;  Location: [facility];  Service: General;  Laterality: N/A;   DILATION AND CURETTAGE OF UTERUS     ERCP N/A 01/10/2021   Procedure: ENDOSCOPIC RETROGRADE CHOLANGIOPANCREATOGRAPHY (ERCP);  Surgeon: Lindsay DareStark, Malcolm T, MD;  [facility];  Service: Endoscopy;  Laterality: N/A;   PORTACATH PLACEMENT  09/12/2020   Procedure: INSERTION PORT-A-CATH WITH ULTRASOUND;  Surgeon: Lindsay KinNewman, David, MD;  Location: [facility];  Service: General;;   SPHINCTEROTOMY  01/10/2021   Procedure: Dennison MascotSPHINCTEROTOMY;  Surgeon: Lindsay DareStark, Malcolm T, MD;  Location: [facility];  Service: Endoscopy;;  balloon sweep   WISDOM TOOTH EXTRACTION  2020    Prior to Admission medications   Medication Sig Start Date End Date Taking? Authorizing Provider  acetaminophen (TYLENOL) 325 MG tablet You can take 2 tablets every 4 hours as needed for pain.  Do not take over 4000 mg of Tylenol/acetaminophen per day.  Tylenol#3 is Tylenol and codeine.  You need to count each of these tablets as part of your daily maximum of Tylenol. 01/11/21  Yes Lindsay GeorgeJennings, Willard, PA-C  ALPRAZolam Prudy Feeler(XANAX) 0.25 MG tablet TAKE 1 TABLET BY MOUTH TWICE A DAY AS NEEDED FOR ANXIETY 10/10/22  Yes Lindsay CroissantGudena, Vinay, MD  anastrozole (ARIMIDEX) 1 MG tablet TAKE 1 TABLET BY MOUTH EVERY DAY 01/24/23  Yes Lindsay CroissantGudena, Vinay, MD  Biotin 1 MG CAPS Take 1 capsule by mouth daily. Patient taking differently: Take 1 capsule by mouth daily. 10000 MCG 12/26/21  Yes Lindsay CroissantGudena, Vinay, MD  Calcium Carbonate-Vit D-Min (CALCIUM 1200 PO) Take by mouth daily. Patient not taking: Reported on 02/18/2023    [provider]  Calcium Citrate 1040 MG TABS Take 1 tablet (1,040 mg total) by mouth daily. Patient not taking: Reported on 02/18/2023 12/26/21   Lindsay Croissant, MD  vitamin C (ASCORBIC ACID) 500 MG tablet Take 500 mg by mouth daily. Patient not taking: Reported on 02/18/2023    [provider]  VITAMIN D PO Take 5,000 Units by mouth daily.    [provider]   Vitamin D, Cholecalciferol, 25 MCG (1000 UT) TABS Take 1 tablet by mouth daily. Patient not taking: Reported on 02/18/2023 12/26/21   Lindsay Croissant, MD  prochlorperazine (COMPAZINE) 10 MG tablet Take 1 tablet (10 mg total) by mouth every 6 (six) hours as needed (Nausea or vomiting). Patient not taking: Reported on 03/29/2021 11/28/20 03/29/21  Lindsay Croissant, MD    Current Outpatient Medications  Medication Sig Dispense Refill   acetaminophen (TYLENOL) 325 MG tablet You can take 2 tablets every 4 hours as needed for pain.  Do not take over 4000 mg of Tylenol/acetaminophen per day.  Tylenol#3 is Tylenol and codeine.  You need to count each of these tablets as part of your daily maximum of Tylenol.     ALPRAZolam (XANAX) 0.25 MG tablet TAKE 1 TABLET BY MOUTH TWICE A DAY AS NEEDED FOR ANXIETY 60 tablet 3   anastrozole (ARIMIDEX) 1 MG tablet TAKE 1 TABLET BY MOUTH EVERY DAY 90 tablet 3   Biotin 1 MG CAPS Take 1 capsule by mouth daily. (Patient taking differently: Take 1 capsule by mouth daily. 10000 MCG) 30 capsule    Calcium Carbonate-Vit D-Min (CALCIUM 1200 PO) Take by mouth daily. (Patient not taking: Reported on 02/18/2023)     Calcium Citrate 1040 MG TABS Take 1 tablet (1,040 mg total) by mouth daily. (Patient not taking: Reported on 02/18/2023)  0   vitamin C (ASCORBIC ACID) 500 MG tablet Take 500 mg by mouth daily. (Patient not taking: Reported on 02/18/2023)     VITAMIN D PO Take 5,000 Units by mouth daily.     Vitamin D, Cholecalciferol, 25 MCG (1000 UT) TABS Take 1 tablet by mouth daily. (Patient not taking: Reported on 02/18/2023) 60 tablet    Current Facility-Administered Medications  Medication Dose Route Frequency Provider Last Rate Last Admin   0.9 %  sodium chloride infusion  500 mL Intravenous Continuous Lindsay Sanko, Eleonore Chiquito, MD        Allergies as of 03/04/2023   (No Known Allergies)    Family History  Problem Relation Age of Onset   Colon polyps Mother    Breast cancer Mother         dx 70   Prostate cancer Father        dx late 50s/early 20s   Cancer Maternal Aunt        maternal grandmother's sister; possible ovarian? dx 66, d. 34s-80s   Colon cancer Maternal Grandmother        early 89s   Esophageal cancer Maternal Grandfather        dx 100s   Breast cancer Cousin        dx 4   Rectal cancer Neg Hx    Stomach cancer Neg Hx     Social History   Socioeconomic History   Marital status: Married    Spouse name: Not on file   Number of children: Not on file   Years of education:  Not on file   Highest education level: Not on file  Occupational History   Not on file  Tobacco Use   Smoking status: Never   Smokeless tobacco: Never  Vaping Use   Vaping Use: Never used  Substance and Sexual Activity   Alcohol use: Not Currently    Comment: not since breast cancer dx   Drug use: No   Sexual activity: Not Currently    Partners: Male    Birth control/protection: None  Other Topics Concern   Not on file  Social History Narrative   Not on file   Social Determinants of Health   Financial Resource Strain: Low Risk  (07/26/2020)   Overall Financial Resource Strain (CARDIA)    Difficulty of Paying Living Expenses: Not hard at all  Food Insecurity: No Food Insecurity (07/26/2020)   Hunger Vital Sign    Worried About Running Out of Food in the Last Year: Never true    Ran Out of Food in the Last Year: Never true  Transportation Needs: No Transportation Needs (07/26/2020)   PRAPARE - Administrator, Civil Service (Medical): No    Lack of Transportation (Non-Medical): No  Physical Activity: Not on file  Stress: Not on file  Social Connections: Not on file  Intimate Partner Violence: Not on file    Review of Systems:  All other review of systems negative except as mentioned in the HPI.  Physical Exam: Vital signs in last 24 hours: Blood Pressure 120/76   Pulse 96   Temperature 98.2 F (36.8 C)   Height 5\' 6"  (1.676 m)   Weight 230 lb (104.3  kg)   Last Menstrual Period 07/26/2000   Oxygen Saturation 98%   Body Mass Index 37.12 kg/m  General:   Alert, NAD Lungs:  Clear .   Heart:  Regular rate and rhythm Abdomen:  Soft, nontender and nondistended. Neuro/Psych:  Alert and cooperative. Normal mood and affect. A and O x 3  Reviewed labs, radiology imaging, old records and pertinent past GI work up  Patient is appropriate for planned procedure(s) and anesthesia in an ambulatory setting   K. Scherry Ran , MD [phone]

## 2023-03-04 NOTE — Op Note (Signed)
[facility] Patient Name: Lindsay PeersRachel Hampton Procedure Date: 03/04/2023 9:45 AM MRN: [id number] Endoscopist: Napoleon FormKavitha V. Rainbow Salman , MD, [phone] Age: [age] Referring MD:  Date of Birth: 1966/01/18 Gender: Female Account #: [id number] Procedure:                Colonoscopy Indications:              High risk colon cancer surveillance: Personal                            history of colonic polyps, High risk colon cancer                            surveillance: Personal history of adenoma less than                            10 mm in size Medicines:                Monitored Anesthesia Care Procedure:                Pre-Anesthesia Assessment:                           - Prior to the procedure, a History and Physical                            was performed, and patient medications and                            allergies were reviewed. The patient's tolerance of                            previous anesthesia was also reviewed. The risks                            and benefits of the procedure and the sedation                            options and risks were discussed with the patient.                            All questions were answered, and informed consent                            was obtained. Prior Anticoagulants: The patient has                            taken no anticoagulant or antiplatelet agents. ASA                            Grade Assessment: III - A patient with severe                            systemic disease. After reviewing the risks and  benefits, the patient was deemed in satisfactory                            condition to undergo the procedure.                           After obtaining informed consent, the colonoscope                            was passed under direct vision. Throughout the                            procedure, the patient's blood pressure, pulse, and                            oxygen saturations were monitored  continuously. The                            Olympus PCF-H190DL (#[id number]) Colonoscope was                            introduced through the anus and advanced to the the                            cecum, identified by appendiceal orifice and                            ileocecal valve. The colonoscopy was performed                            without difficulty. The patient tolerated the                            procedure well. The quality of the bowel                            preparation was good. The ileocecal valve,                            appendiceal orifice, and rectum were photographed. Scope In: 9:47:31 AM Scope Out: 10:03:53 AM Scope Withdrawal Time: 0 hours 11 minutes 24 seconds  Total Procedure Duration: 0 hours 16 minutes 22 seconds  Findings:                 The perianal and digital rectal examinations were                            normal.                           A 3 mm polyp was found in the ascending colon. The                            polyp was sessile. The polyp was removed with a  cold snare. Resection and retrieval were complete.                           A few small-mouthed diverticula were found in the                            sigmoid colon.                           Non-bleeding external and internal hemorrhoids were                            found during retroflexion. The hemorrhoids were                            medium-sized. Complications:            No immediate complications. Estimated Blood Loss:     Estimated blood loss was minimal. Impression:               - One 3 mm polyp in the ascending colon, removed                            with a cold snare. Resected and retrieved.                           - Diverticulosis in the sigmoid colon.                           - Non-bleeding external and internal hemorrhoids. Recommendation:           - Patient has a contact number available for                             emergencies. The signs and symptoms of potential                            delayed complications were discussed with the                            patient. Return to normal activities tomorrow.                            Written discharge instructions were provided to the                            patient.                           - Resume previous diet.                           - Continue present medications.                           - Await pathology results.                           -  Repeat colonoscopy in 5-10 years for surveillance                            based on pathology results. Napoleon Form, MD 03/04/2023 10:09:38 AM This report has been signed electronically.

## 2023-03-05 ENCOUNTER — Telehealth: Payer: Self-pay

## 2023-03-05 NOTE — Telephone Encounter (Signed)
  Follow up Call-     03/04/2023    8:01 AM  Call back number  Post procedure Call Back phone  # [phone]  Permission to leave phone message Yes     Patient questions:  Do you have a fever, pain , or abdominal swelling? No. Pain Score  0 *  Have you tolerated food without any problems? Yes.    Have you been able to return to your normal activities? Yes.    Do you have any questions about your discharge instructions: Diet   No. Medications  No. Follow up visit  No.  Do you have questions or concerns about your Care? No.  Actions: * If pain score is 4 or above: No action needed, pain <4.

## 2023-03-11 ENCOUNTER — Encounter: Payer: Self-pay | Admitting: Gastroenterology

## 2023-04-16 ENCOUNTER — Other Ambulatory Visit: Payer: Self-pay | Admitting: Hematology and Oncology

## 2023-04-16 DIAGNOSIS — F4322 Adjustment disorder with anxiety: Secondary | ICD-10-CM

## 2023-06-24 NOTE — Progress Notes (Deleted)
57 y.o. G79P2012 Married Caucasian female here for annual exam.    PCP:     Patient's last menstrual period was 07/26/2000.           Sexually active: {yes no:314532}  The current method of family planning is post menopausal status.    Exercising: {yes no:314532}  {types:19826} Smoker:  no  Health Maintenance: Pap:  06/25/22 neg: HR HPV neg, 06/21/21 ASCUS: HR HPV neg, 03/22/20 ASCUS Hr HPV neg 01/19/19 neg HPV Neg,   12/15/2017 ASCUS pos HPV,  12-17-16 ASCUS + HR HPV History of abnormal Pap:  yes, Colpo 04/13/20 CIN I ECC neg H/O LEEP  In 2/19 for HSIL, pathology with CIN II, negative margins, negative ECC.  MMG:  10/28/22 Breast Density Cat B, BI-RADS CAT 2 benign Colonoscopy:  03/04/23 BMD:   n/a  Result  n/a TDaP:  06/19/18 Gardasil:   no HIV: 12/17/16 NR Hep C: 03/19/18 NR Screening Labs:  Hb today: ***, Urine today: ***   reports that she has never smoked. She has never used smokeless tobacco. She reports that she does not currently use alcohol. She reports that she does not use drugs.  Past Medical History:  Diagnosis Date   Acute biliary pancreatitis without infection or necrosis    Acute cholecystitis    Allergy    seasonal   Anemia    After childbirth   Anxiety    Breast cancer (HCC)    Elevated LFTs    Family history of breast cancer 07/26/2020   Family history of colon cancer 07/26/2020   Family history of prostate cancer 07/26/2020   GERD (gastroesophageal reflux disease)    Hypertensive urgency 01/08/2021   Personal history of chemotherapy    Personal history of radiation therapy    Pneumonia    In 20s    Past Surgical History:  Procedure Laterality Date   AXILLARY LYMPH NODE DISSECTION Left 08/16/2020   Procedure: LEFT TARGETED AXILLARY LYMPH NODE DISSECTION;  Surgeon: Ovidio Kin, MD;  Location: [facility] OR;  Service: General;  Laterality: Left;   BREAST BIOPSY Left 10/31/2021   BREAST LUMPECTOMY Left 07/2020   BREAST LUMPECTOMY WITH RADIOACTIVE SEED AND AXILLARY  LYMPH NODE DISSECTION Left 08/16/2020   Procedure: LEFT BREAST LUMPECTOMY WITH RADIOACTIVE SEED;  Surgeon: Ovidio Kin, MD;  Location: [facility] OR;  Service: General;  Laterality: Left;  PEC BLOCK   BREAST SURGERY Left 06/2020   breast bx    CERVICAL BIOPSY  W/ LOOP ELECTRODE EXCISION  12/2017   CESAREAN SECTION     CHOLECYSTECTOMY N/A 01/09/2021   Procedure: LAPAROSCOPIC CHOLECYSTECTOMY WITH INTRAOPERATIVE CHOLANGIOGRAM;  Surgeon: Manus Rudd, MD;  Location: [facility] ORS;  Service: General;  Laterality: N/A;   DILATION AND CURETTAGE OF UTERUS     ERCP N/A 01/10/2021   Procedure: ENDOSCOPIC RETROGRADE CHOLANGIOPANCREATOGRAPHY (ERCP);  Surgeon: Meryl Dare, MD;  Location: [facility];  Service: Endoscopy;  Laterality: N/A;   PORTACATH PLACEMENT  09/12/2020   Procedure: INSERTION PORT-A-CATH WITH ULTRASOUND;  Surgeon: Ovidio Kin, MD;  Location: [facility] ORS;  Service: General;;   SPHINCTEROTOMY  01/10/2021   Procedure: Dennison Mascot;  Surgeon: Meryl Dare, MD;  Location: [facility] END[facility]COPY;  Service: Endoscopy;;  balloon sweep   WISDOM TOOTH EXTRACTION  2020    Current Outpatient Medications  Medication Sig Dispense Refill   acetaminophen (TYLENOL) 325 MG tablet You can take 2 tablets every 4 hours as needed for pain.  Do not take over 4000 mg of Tylenol/acetaminophen per day.  Tylenol#3  is Tylenol and codeine.  You need to count each of these tablets as part of your daily maximum of Tylenol.     ALPRAZolam (XANAX) 0.25 MG tablet TAKE 1 TABLET BY MOUTH TWICE A DAY AS NEEDED FOR ANXIETY 60 tablet 3   anastrozole (ARIMIDEX) 1 MG tablet TAKE 1 TABLET BY MOUTH EVERY DAY 90 tablet 3   Biotin 1 MG CAPS Take 1 capsule by mouth daily. (Patient taking differently: Take 1 capsule by mouth daily. 10000 MCG) 30 capsule    Calcium Carbonate-Vit D-Min (CALCIUM 1200 PO) Take by mouth daily. (Patient not taking: Reported on 02/18/2023)     Calcium Citrate 1040 MG TABS Take 1 tablet (1,040 mg total) by mouth daily.  (Patient not taking: Reported on 02/18/2023)  0   vitamin C (ASCORBIC ACID) 500 MG tablet Take 500 mg by mouth daily. (Patient not taking: Reported on 02/18/2023)     VITAMIN D PO Take 5,000 Units by mouth daily.     Vitamin D, Cholecalciferol, 25 MCG (1000 UT) TABS Take 1 tablet by mouth daily. (Patient not taking: Reported on 02/18/2023) 60 tablet    No current facility-administered medications for this visit.    Family History  Problem Relation Age of Onset   Colon polyps Mother    Breast cancer Mother        dx 25   Prostate cancer Father        dx late 50s/early 9s   Cancer Maternal Aunt        maternal grandmother's sister; possible ovarian? dx 41, d. 84s-80s   Colon cancer Maternal Grandmother        early 32s   Esophageal cancer Maternal Grandfather        dx 27s   Breast cancer Cousin        dx 27   Rectal cancer Neg Hx    Stomach cancer Neg Hx     Review of Systems  Exam:   LMP 07/26/2000     General appearance: alert, cooperative and appears stated age Head: normocephalic, without obvious abnormality, atraumatic Neck: no adenopathy, supple, symmetrical, trachea midline and thyroid normal to inspection and palpation Lungs: clear to auscultation bilaterally Breasts: normal appearance, no masses or tenderness, No nipple retraction or dimpling, No nipple discharge or bleeding, No axillary adenopathy Heart: regular rate and rhythm Abdomen: soft, non-tender; no masses, no organomegaly Extremities: extremities normal, atraumatic, no cyanosis or edema Skin: skin color, texture, turgor normal. No rashes or lesions Lymph nodes: cervical, supraclavicular, and axillary nodes normal. Neurologic: grossly normal  Pelvic: External genitalia:  no lesions              No abnormal inguinal nodes palpated.              Urethra:  normal appearing urethra with no masses, tenderness or lesions              Bartholins and Skenes: normal                 Vagina: normal appearing vagina  with normal color and discharge, no lesions              Cervix: no lesions              Pap taken: {yes no:314532} Bimanual Exam:  Uterus:  normal size, contour, position, consistency, mobility, non-tender              Adnexa: no mass, fullness, tenderness  Rectal exam: {yes no:314532}.  Confirms.              Anus:  normal sphincter tone, no lesions  Chaperone was present for exam:  ***  Assessment:   Well woman visit with gynecologic exam.   Plan: Mammogram screening discussed. Self breast awareness reviewed. Pap and HR HPV as above. Guidelines for Calcium, Vitamin D, regular exercise program including cardiovascular and weight bearing exercise.   Follow up annually and prn.   Additional counseling given.  {yes [id number]. _______ minutes face to face time of which over 50% was spent in counseling.    After visit summary provided.

## 2023-07-02 ENCOUNTER — Ambulatory Visit: Payer: 59 | Admitting: Obstetrics and Gynecology

## 2023-07-08 ENCOUNTER — Telehealth: Payer: Self-pay | Admitting: Hematology and Oncology

## 2023-07-08 ENCOUNTER — Other Ambulatory Visit: Payer: Self-pay | Admitting: Hematology and Oncology

## 2023-07-08 ENCOUNTER — Telehealth: Payer: Self-pay

## 2023-07-08 ENCOUNTER — Other Ambulatory Visit: Payer: Self-pay

## 2023-07-08 ENCOUNTER — Ambulatory Visit: Payer: 59 | Admitting: Obstetrics and Gynecology

## 2023-07-08 DIAGNOSIS — Z9889 Other specified postprocedural states: Secondary | ICD-10-CM

## 2023-07-08 DIAGNOSIS — Z170 Estrogen receptor positive status [ER+]: Secondary | ICD-10-CM

## 2023-07-08 DIAGNOSIS — Z780 Asymptomatic menopausal state: Secondary | ICD-10-CM

## 2023-07-08 NOTE — Telephone Encounter (Signed)
Pt called to let us know she tried to schedule DEXA scan but the order would expire before they can get her scheduled. New order placed per MD and MD f/u r/s for feb.

## 2023-07-08 NOTE — Telephone Encounter (Signed)
 Rescheduled appointment per patients request via incoming call. Patient is aware of the changes made to her upcoming appointment.

## 2023-09-02 ENCOUNTER — Ambulatory Visit (INDEPENDENT_AMBULATORY_CARE_PROVIDER_SITE_OTHER): Payer: 59 | Admitting: Radiology

## 2023-09-02 ENCOUNTER — Encounter: Payer: Self-pay | Admitting: Radiology

## 2023-09-02 VITALS — BP 116/82 | Ht 65.5 in | Wt 242.0 lb

## 2023-09-02 DIAGNOSIS — Z853 Personal history of malignant neoplasm of breast: Secondary | ICD-10-CM | POA: Diagnosis not present

## 2023-09-02 DIAGNOSIS — Z01419 Encounter for gynecological examination (general) (routine) without abnormal findings: Secondary | ICD-10-CM

## 2023-09-02 NOTE — Progress Notes (Signed)
Lindsay Hampton 04/05/1966 [id number]   History: Postmenopausal 57 y.o. presents for annual exam. S/p left breast CA with lumpectomy and chemo for involved lymph nodes. On anastrozole, doing well. Followed by oncology yearly. No gyn concerns.    Gynecologic History Postmenopausal Last Pap: 2023. Results were: normal, previous multiple ASCUS, hx of LEEP Last mammogram: 12/23. Results were: normal Last colonoscopy: 02/2023   Obstetric History OB History  Gravida Para Term Preterm AB Living  3 2 2   1 2   SAB IAB Ectopic Multiple Live Births  1       2    # Outcome Date GA Lbr Len/2nd Weight Sex Type Anes PTL Lv  3 Term      CS-Unspec   LIV  2 Term      Vag-Spont   LIV  1 SAB              The following portions of the patient's history were reviewed and updated as appropriate: allergies, current medications, past family history, past medical history, past social history, past surgical history, and problem list.  Review of Systems Pertinent items noted in HPI and remainder of comprehensive ROS otherwise negative.  Past medical history, past surgical history, family history and social history were all reviewed and documented in the EPIC chart.  Exam:  Vitals:   09/02/23 1325  BP: 116/82  Weight: 242 lb (109.8 kg)  Height: 5' 5.5" (1.664 m)   Body mass index is 39.66 kg/m.  General appearance:  Normal Thyroid:  Symmetrical, normal in size, without palpable masses or nodularity. Respiratory  Auscultation:  Clear without wheezing or rhonchi Cardiovascular  Auscultation:  Regular rate, without rubs, murmurs or gallops  Edema/varicosities:  Not grossly evident Abdominal  Soft,nontender, without masses, guarding or rebound.  Liver/spleen:  No organomegaly noted  Hernia:  None appreciated  Skin  Inspection:  Grossly normal Breasts: Examined lying and sitting.   Right: Without masses, retractions, nipple discharge or axillary adenopathy.   Left: Without masses,  retractions, nipple discharge or axillary adenopathy. Genitourinary   Inguinal/mons:  Normal without inguinal adenopathy  External genitalia:  Normal appearing vulva with no masses, tenderness, or lesions  BUS/Urethra/Skene's glands:  Normal  Vagina:  Normal appearing with normal color and discharge, no lesions. Atrophy: mild   Cervix:  Normal appearing without discharge or lesions  Uterus:  Normal in size, shape and contour.  Midline and mobile, nontender  Adnexa/parametria:     Rt: Normal in size, without masses or tenderness.   Lt: Normal in size, without masses or tenderness.  Anus and perineum: Normal    Raynelle Fanning, CMA present for exam  Assessment/Plan:   1. Well woman exam with routine gynecological exam Pap 2026 AEX and labs with PCP  2. History of breast cancer Yearly mammo and f/u with oncology   Discussed SBE, colonoscopy and DEXA screening as directed. Recommend of exercise weekly, including weight bearing exercise. Encouraged the use of seatbelts and sunscreen.  Return in 1 year for annual or sooner prn.  Won Kreuzer B WHNP-BC, 1:42 PM 09/02/2023

## 2023-09-02 NOTE — Patient Instructions (Signed)
Preventive Care 40-56 Years Old, Female Preventive care refers to lifestyle choices and visits with your health care provider that can promote health and wellness. Preventive care visits are also called wellness exams. What can I expect for my preventive care visit? Counseling Your health care provider may ask you questions about your: Medical history, including: Past medical problems. Family medical history. Pregnancy history. Current health, including: Menstrual cycle. Method of birth control. Emotional well-being. Home life and relationship well-being. Sexual activity and sexual health. Lifestyle, including: Alcohol, nicotine or tobacco, and drug use. Access to firearms. Diet, exercise, and sleep habits. Work and work environment. Sunscreen use. Safety issues such as seatbelt and bike helmet use. Physical exam Your health care provider will check your: Height and weight. These may be used to calculate your BMI (body mass index). BMI is a measurement that tells if you are at a healthy weight. Waist circumference. This measures the distance around your waistline. This measurement also tells if you are at a healthy weight and may help predict your risk of certain diseases, such as type 2 diabetes and high blood pressure. Heart rate and blood pressure. Body temperature. Skin for abnormal spots. What immunizations do I need?  Vaccines are usually given at various ages, according to a schedule. Your health care provider will recommend vaccines for you based on your age, medical history, and lifestyle or other factors, such as travel or where you work. What tests do I need? Screening Your health care provider may recommend screening tests for certain conditions. This may include: Lipid and cholesterol levels. Diabetes screening. This is done by checking your blood sugar (glucose) after you have not eaten for a while (fasting). Pelvic exam and Pap test. Hepatitis B test. Hepatitis C  test. HIV (human immunodeficiency virus) test. STI (sexually transmitted infection) testing, if you are at risk. Lung cancer screening. Colorectal cancer screening. Mammogram. Talk with your health care provider about when you should start having regular mammograms. This may depend on whether you have a family history of breast cancer. BRCA-related cancer screening. This may be done if you have a family history of breast, ovarian, tubal, or peritoneal cancers. Bone density scan. This is done to screen for osteoporosis. Talk with your health care provider about your test results, treatment options, and if necessary, the need for more tests. Follow these instructions at home: Eating and drinking  Eat a diet that includes fresh fruits and vegetables, whole grains, lean protein, and low-fat dairy products. Take vitamin and mineral supplements as recommended by your health care provider. Do not drink alcohol if: Your health care provider tells you not to drink. You are pregnant, may be pregnant, or are planning to become pregnant. If you drink alcohol: Limit how much you have to 0-1 drink a day. Know how much alcohol is in your drink. In the [place] one drink equals one 12 oz bottle of beer (355 mL), one 5 oz glass of wine (148 mL), or one 1 oz glass of hard liquor (44 mL). Lifestyle Brush your teeth every morning and night with fluoride toothpaste. Floss one time each day. Exercise for at least 30 minutes 5 or more days each week. Do not use any products that contain nicotine or tobacco. These products include cigarettes, chewing tobacco, and vaping devices, such as e-cigarettes. If you need help quitting, ask your health care provider. Do not use drugs. If you are sexually active, practice safe sex. Use a condom or other form of protection to   prevent STIs. If you do not wish to become pregnant, use a form of birth control. If you plan to become pregnant, see your health care provider for a  prepregnancy visit. Take aspirin only as told by your health care provider. Make sure that you understand how much to take and what form to take. Work with your health care provider to find out whether it is safe and beneficial for you to take aspirin daily. Find healthy ways to manage stress, such as: Meditation, yoga, or listening to music. Journaling. Talking to a trusted person. Spending time with friends and family. Minimize exposure to UV radiation to reduce your risk of skin cancer. Safety Always wear your seat belt while driving or riding in a vehicle. Do not drive: If you have been drinking alcohol. Do not ride with someone who has been drinking. When you are tired or distracted. While texting. If you have been using any mind-altering substances or drugs. Wear a helmet and other protective equipment during sports activities. If you have firearms in your house, make sure you follow all gun safety procedures. Seek help if you have been physically or sexually abused. What's next? Visit your health care provider once a year for an annual wellness visit. Ask your health care provider how often you should have your eyes and teeth checked. Stay up to date on all vaccines. This information is not intended to replace advice given to you by your health care provider. Make sure you discuss any questions you have with your health care provider. Document Revised: 05/09/2021 Document Reviewed: 05/09/2021 Elsevier Patient Education  2024 [employer]vier Inc.  

## 2023-10-30 ENCOUNTER — Ambulatory Visit
Admission: RE | Admit: 2023-10-30 | Discharge: 2023-10-30 | Disposition: A | Discharge status: Home / Self Care | Payer: 59 | Source: Ambulatory Visit | Attending: Hematology and Oncology | Admitting: Hematology and Oncology

## 2023-10-30 DIAGNOSIS — Z9889 Other specified postprocedural states: Secondary | ICD-10-CM

## 2023-11-10 ENCOUNTER — Ambulatory Visit (HOSPITAL_BASED_OUTPATIENT_CLINIC_OR_DEPARTMENT_OTHER)
Admission: RE | Admit: 2023-11-10 | Discharge: 2023-11-10 | Disposition: A | Discharge status: Home / Self Care | Payer: 59 | Source: Ambulatory Visit | Attending: Hematology and Oncology | Admitting: Hematology and Oncology

## 2023-11-10 DIAGNOSIS — Z780 Asymptomatic menopausal state: Secondary | ICD-10-CM | POA: Insufficient documentation

## 2023-11-10 DIAGNOSIS — Z170 Estrogen receptor positive status [ER+]: Secondary | ICD-10-CM | POA: Insufficient documentation

## 2023-11-10 DIAGNOSIS — C50512 Malignant neoplasm of lower-outer quadrant of left female breast: Secondary | ICD-10-CM | POA: Insufficient documentation

## 2023-11-11 ENCOUNTER — Telehealth: Payer: Self-pay | Admitting: *Deleted

## 2023-11-11 NOTE — Telephone Encounter (Signed)
Received call from pt requesting to review recent bone density scan results.  RN reviewed results with pt and pt verbalized understanding.

## 2023-12-12 ENCOUNTER — Other Ambulatory Visit: Payer: Self-pay | Admitting: Hematology and Oncology

## 2023-12-12 DIAGNOSIS — F4322 Adjustment disorder with anxiety: Secondary | ICD-10-CM

## 2023-12-16 ENCOUNTER — Other Ambulatory Visit: Payer: Self-pay | Admitting: Hematology and Oncology

## 2023-12-16 DIAGNOSIS — F4322 Adjustment disorder with anxiety: Secondary | ICD-10-CM

## 2023-12-16 MED ORDER — ALPRAZOLAM 0.25 MG PO TABS: 0.2500 mg | ORAL_TABLET | Freq: Two times a day (BID) | ORAL | 3 refills | Status: DC | PRN

## 2023-12-29 ENCOUNTER — Inpatient Hospital Stay: Payer: BC Managed Care – PPO | Attending: Hematology and Oncology | Admitting: Hematology and Oncology

## 2023-12-29 ENCOUNTER — Ambulatory Visit: Payer: 59 | Admitting: Hematology and Oncology

## 2023-12-29 VITALS — BP 129/88 | HR 88 | Temp 98.4°F | Resp 18 | Ht 65.5 in | Wt 239.8 lb

## 2023-12-29 DIAGNOSIS — Z1721 Progesterone receptor positive status: Secondary | ICD-10-CM | POA: Insufficient documentation

## 2023-12-29 DIAGNOSIS — Z923 Personal history of irradiation: Secondary | ICD-10-CM | POA: Insufficient documentation

## 2023-12-29 DIAGNOSIS — C50512 Malignant neoplasm of lower-outer quadrant of left female breast: Secondary | ICD-10-CM | POA: Insufficient documentation

## 2023-12-29 DIAGNOSIS — Z170 Estrogen receptor positive status [ER+]: Secondary | ICD-10-CM | POA: Insufficient documentation

## 2023-12-29 DIAGNOSIS — Z9221 Personal history of antineoplastic chemotherapy: Secondary | ICD-10-CM | POA: Diagnosis not present

## 2023-12-29 DIAGNOSIS — Z79811 Long term (current) use of aromatase inhibitors: Secondary | ICD-10-CM | POA: Diagnosis not present

## 2023-12-29 DIAGNOSIS — Z1732 Human epidermal growth factor receptor 2 negative status: Secondary | ICD-10-CM | POA: Diagnosis not present

## 2023-12-29 MED ORDER — FAMOTIDINE 40 MG PO TABS: 20.0000 mg | ORAL_TABLET | Freq: Every day | ORAL | Status: AC

## 2023-12-29 NOTE — Assessment & Plan Note (Signed)
08/16/2020:Left lumpectomy Ezzard Standing): IDC, grade 2, 2.3cm, with intermediate grade DCIS, clear margins, 1/2 left axillary lymph nodes positive for carcinoma.HER-2 equivocal by IHC (2+), negative by FISH, ER+ 95%, PR+40%, Ki67 15%.  T2 N1 M0 stage Ib MammaPrint: High risk (average 10-year risk untreated: 29%, predicted benefit of chemo with hormone therapy at 5 years: 94.6%   Treatment plan:  1. Adjuvant chemotherapy with dose dense Adriamycin and Cytoxan x4 followed by Taxol  completed 02/19/21 2. Followed by adjuvant radiation 04/06/21- 05/22/21 3. Followed by adjuvant antiestrogen therapy with Anastrozole started 05/2021 ------------------------------------------------------------------------------------------------------------------------------------ Anastrozole daily, tolerating well Occasional mild hot flashes and mild joint stiffness   Breast Cancer Surveillance: Mammogram 10/30/2023: Benign: Breast density category B Breast Exam: 12/29/2023: Benign Bone density 11/10/2023: T-score -0.1: Normal   Patient is interested in wider clinical trial with Ribociclib.  We will get back in touch with her once the trial starts.   RTC in 1 year

## 2023-12-29 NOTE — Progress Notes (Signed)
Patient Care Team: Dois Davenport, MD as PCP - General (Family Medicine) Serena Croissant, MD as Consulting Physician (Hematology and Oncology) Dorothy Puffer, MD as Consulting Physician (Radiation Oncology) Tanda Rockers, NP as Nurse Practitioner (Obstetrics and Gynecology)  DIAGNOSIS:  Encounter Diagnosis  Name Primary?   Malignant neoplasm of lower-outer quadrant of left breast of female, estrogen receptor positive (HCC) Yes    SUMMARY OF ONCOLOGIC HISTORY: Oncology History  Malignant neoplasm of lower-outer quadrant of left breast of female, estrogen receptor positive (HCC)  07/20/2020 Initial Diagnosis   Screening mammogram detected left breast calcifications, enlarged left axillary lymph nodes, and an asymmetry in the right breast. Diagnostic mammogram showed in the left breast, a 2.4cm mass with calcifications, 3:30 position, a left axillary lymph node with cortical thickness, and in the right breast, no suspicious masses or calcifications. Left breast biopsy showed invasive and in situ mammary carcinoma in the breast and axilla, grade 2, HER-2 equivocal by IHC (2+), negative by FISH, ER+ 95%, PR+40%, Ki67 15%.    07/26/2020 Cancer Staging   Staging form: Breast, AJCC 8th Edition - Clinical stage from 07/26/2020: Stage IIA (cT2, cN1(f), cM0, G2, ER+, PR+, HER2-) - Signed by Serena Croissant, MD on 07/26/2020   08/01/2020 Genetic Testing   No pathogenic variants detected in [facility] Multi-Cancer Panel.  The Multi-Cancer Panel offered by [facility] includes sequencing and/or deletion duplication testing of the following 85 genes: AIP, ALK, APC, ATM, AXIN2,BAP1,  BARD1, BLM, BMPR1A, BRCA1, BRCA2, BRIP1, CASR, CDC73, CDH1, CDK4, CDKN1B, CDKN1C, CDKN2A (p14ARF), CDKN2A (p16INK4a), CEBPA, CHEK2, CTNNA1, DICER1, DIS3L2, EGFR (c.2369C>T, p.Thr790Met variant only), EPCAM (Deletion/duplication testing only), FH, FLCN, GATA2, GPC3, GREM1 (Promoter region deletion/duplication testing only), HOXB13 (c.251G>A,  p.Gly84Glu), HRAS, KIT, MAX, MEN1, MET, MITF (c.952G>A, p.Glu318Lys variant only), MLH1, MSH2, MSH3, MSH6, MUTYH, NBN, NF1, NF2, NTHL1, PALB2, PDGFRA, PHOX2B, PMS2, POLD1, POLE, POT1, PRKAR1A, PTCH1, PTEN, RAD50, RAD51C, RAD51D, RB1, RECQL4, RET, RNF43, RUNX1, SDHAF2, SDHA (sequence changes only), SDHB, SDHC, SDHD, SMAD4, SMARCA4, SMARCB1, SMARCE1, STK11, SUFU, TERC, TERT, TMEM127, TP53, TSC1, TSC2, VHL, WRN and WT1.  The report date is August 01, 2020.    08/16/2020 Surgery   Left lumpectomy Ezzard Standing): IDC, grade 2, 2.3cm, with intermediate grade DCIS, clear margins, 1/2 left axillary lymph nodes positive for carcinoma.HER-2 equivocal by IHC (2+), negative by FISH, ER+ 95%, PR+40%, Ki67 15%.    08/24/2020 Cancer Staging   Staging form: Breast, AJCC 8th Edition - Pathologic stage from 08/24/2020: Stage IB (pT2, pN1a, cM0, G2, ER+, PR+, HER2-) - Signed by Serena Croissant, MD on 08/24/2020   08/30/2020 Miscellaneous   MammaPrint high risk   09/13/2020 - 02/19/2021 Adjuvant Chemotherapy   Adriamycin/Cytoxan x 4 cycles followed by Taxol weekly x 12 (dose reduced cycles 5-12)   04/05/2021 - 05/22/2021 Radiation Therapy   Site Technique Total Dose (Gy) Dose per Fx (Gy) Completed Fx Beam Energies  Breast, Left: Breast_Lt 3D 50.4/50.4 1.8 28/28 10X  Breast, Left: Breast_Lt_SCLV 3D 50.4/50.4 1.8 28/28 6X, 10X  Breast, Left: Breast_Lt_Bst 3D 10/10 2 5/5 6X, 10X      05/29/2021 -  Anti-estrogen oral therapy   Anastrozole daily     CHIEF COMPLIANT: Follow-up on anastrozole therapy  HISTORY OF PRESENT ILLNESS:   History of Present Illness   The patient, with breast cancer, presents for follow-up regarding management.  She has been on anastrozole for two and a half years following her breast cancer diagnosis three and a half years ago. She experiences manageable side effects of  anastrozole, such as hot flashes and muscle aches. No breast pain or discomfort is noted, and her recent mammogram results were  good.  Her bone density is excellent, with a score of 5.1, indicating normal bone health. She mentions concerns about degenerative changes in her disc, described as normal wear and tear.  She previously took Tums for calcium supplementation but reduced the intake after slightly elevated calcium levels were noted in her blood work. She has been prescribed famotidine 40 mg for acid stomach issues and has stopped taking Tums.  Her current medications include anastrozole, vitamin D, biotin, Xanax as needed, and famotidine 40 mg once daily.         ALLERGIES:  has no known allergies.  MEDICATIONS:  Current Outpatient Medications  Medication Sig Dispense Refill   famotidine (PEPCID) 40 MG tablet Take 0.5 tablets (20 mg total) by mouth daily.     acetaminophen (TYLENOL) 325 MG tablet You can take 2 tablets every 4 hours as needed for pain.  Do not take over 4000 mg of Tylenol/acetaminophen per day.  Tylenol#3 is Tylenol and codeine.  You need to count each of these tablets as part of your daily maximum of Tylenol.     ALPRAZolam (XANAX) 0.25 MG tablet Take 1 tablet (0.25 mg total) by mouth 2 (two) times daily as needed for anxiety. 60 tablet 3   anastrozole (ARIMIDEX) 1 MG tablet TAKE 1 TABLET BY MOUTH EVERY DAY 90 tablet 3   Biotin 1 MG CAPS Take 1 capsule by mouth daily. (Patient taking differently: Take 1 capsule by mouth daily. 10000 MCG) 30 capsule    Vitamin D, Cholecalciferol, 25 MCG (1000 UT) TABS Take 1 tablet by mouth daily. 60 tablet    No current facility-administered medications for this visit.    PHYSICAL EXAMINATION: ECOG PERFORMANCE STATUS: 1 - Symptomatic but completely ambulatory  Vitals:   12/29/23 1132  BP: 129/88  Pulse: 88  Resp: 18  Temp: 98.4 F (36.9 C)  SpO2: 100%   Filed Weights   12/29/23 1132  Weight: 239 lb 12.8 oz (108.8 kg)      LABORATORY DATA:  I have reviewed the data as listed    Latest Ref Rng & Units 04/26/2021   10:51 AM 02/19/2021   11:25  AM 02/12/2021    8:45 AM  CMP  Glucose 70 - 99 mg/dL 91  098  119   BUN 6 - 20 mg/dL 14  9  12    Creatinine 0.44 - 1.00 mg/dL 1.47  8.29  5.62   Sodium 135 - 145 mmol/L 140  139  135   Potassium 3.5 - 5.1 mmol/L 4.2  4.2  4.0   Chloride 98 - 111 mmol/L 106  106  104   CO2 22 - 32 mmol/L 24  23  24    Calcium 8.9 - 10.3 mg/dL 13.0  9.3  86.5   Total Protein 6.5 - 8.1 g/dL 7.4  6.9  7.3   Total Bilirubin 0.3 - 1.2 mg/dL 0.6  0.5  0.6   Alkaline Phos 38 - 126 U/L 75  62  66   AST 15 - 41 U/L 22  25  34   ALT 0 - 44 U/L 22  35  44     Lab Results  Component Value Date   WBC 6.2 04/26/2021   HGB 12.8 04/26/2021   HCT 39.2 04/26/2021   MCV 88.9 04/26/2021   PLT 279 04/26/2021   NEUTROABS 4.4 04/26/2021  ASSESSMENT & PLAN:  Malignant neoplasm of lower-outer quadrant of left breast of female, estrogen receptor positive (HCC) 08/16/2020:Left lumpectomy Ezz[facility] Standing): IDC, grade 2, 2.3cm, with intermediate grade DCIS, clear margins, 1/2 left axillary lymph nodes positive for carcinoma.HER-2 equivocal by IHC (2+), negative by FISH, ER+ 95%, PR+40%, Ki67 15%.  T2 N1 M0 stage Ib MammaPrint: High risk (average 10-year risk untreated: 29%, predicted benefit of chemo with hormone therapy at 5 years: 94.6%   Treatment plan:  1. Adjuvant chemotherapy with dose dense Adriamycin and Cytoxan x4 followed by Taxol  completed 02/19/21 2. Followed by adjuvant radiation 04/06/21- 05/22/21 3. Followed by adjuvant antiestrogen therapy with Anastrozole started 05/2021 ------------------------------------------------------------------------------------------------------------------------------------ Anastrozole daily, tolerating well Occasional mild hot flashes and mild joint stiffness   Breast Cancer Surveillance: Mammogram 10/30/2023: Benign: Breast density category B Breast Exam: 12/29/2023: Benign Bone density 11/10/2023: T-score -0.1: Normal   Patient is interested in wider clinical trial with Ribociclib.   We will get back in touch with her once the trial starts. Patient wants to consider weight loss injections.   She will be discussing this with her PCP.  RTC in 1 year        No orders of the defined types were placed in this encounter.  The patient has a good understanding of the overall plan. she agrees with it. she will call with any problems that may develop before the next visit here. Total time spent: 30 mins including face to face time and time spent for planning, charting and co-ordination of care   Tamsen Meek, MD 12/29/23

## 2024-01-05 ENCOUNTER — Ambulatory Visit: Payer: 59 | Admitting: Hematology and Oncology

## 2024-07-27 ENCOUNTER — Other Ambulatory Visit: Payer: Self-pay | Admitting: Hematology and Oncology

## 2024-07-27 DIAGNOSIS — Z9889 Other specified postprocedural states: Secondary | ICD-10-CM

## 2024-07-27 DIAGNOSIS — Z853 Personal history of malignant neoplasm of breast: Secondary | ICD-10-CM

## 2024-09-02 ENCOUNTER — Encounter: Payer: Self-pay | Admitting: Radiology

## 2024-09-02 ENCOUNTER — Ambulatory Visit: Payer: Self-pay | Admitting: Radiology

## 2024-09-02 ENCOUNTER — Other Ambulatory Visit: Payer: Self-pay | Admitting: Hematology and Oncology

## 2024-09-02 VITALS — BP 122/76 | HR 84 | Ht 65.5 in | Wt 184.0 lb

## 2024-09-02 DIAGNOSIS — Z01419 Encounter for gynecological examination (general) (routine) without abnormal findings: Secondary | ICD-10-CM

## 2024-09-02 DIAGNOSIS — Z853 Personal history of malignant neoplasm of breast: Secondary | ICD-10-CM | POA: Diagnosis not present

## 2024-09-02 DIAGNOSIS — N958 Other specified menopausal and perimenopausal disorders: Secondary | ICD-10-CM

## 2024-09-02 DIAGNOSIS — Z1331 Encounter for screening for depression: Secondary | ICD-10-CM

## 2024-09-02 DIAGNOSIS — F4322 Adjustment disorder with anxiety: Secondary | ICD-10-CM

## 2024-09-02 MED ORDER — ESTRADIOL 10 MCG VA TABS: 1.0000 | ORAL_TABLET | VAGINAL | 11 refills | Status: AC

## 2024-09-02 NOTE — Progress Notes (Signed)
 Lindsay Hampton 1965/12/10 [id number]   History: Postmenopausal 58 y.o. presents for annual exam. S/p left breast CA with lumpectomy and chemo for involved lymph nodes. On anastrozole , doing well. Followed by oncology yearly. C/o frequency of urination at night. Up almost every hour, fine during the day.   Gynecologic History Postmenopausal Last Pap: 2023. Results were: normal, previous multiple ASCUS, hx of LEEP Last mammogram: 10/30/23. Results were: normal Last colonoscopy: 02/2023   Obstetric History OB History  Gravida Para Term Preterm AB Living  3 2 2  1 2   SAB IAB Ectopic Multiple Live Births  1    2    # Outcome Date GA Lbr Len/2nd Weight Sex Type Anes PTL Lv  3 Term      CS-Unspec   LIV  2 Term      Vag-Spont   LIV  1 SAB               09/02/2024   10:48 AM 07/14/2020    1:34 PM  Depression screen PHQ 2/9  Decreased Interest 0 2  Down, Depressed, Hopeless 0 1  PHQ - 2 Score 0 3  Altered sleeping  2  Tired, decreased energy  2  Change in appetite  1  Feeling bad or failure about yourself   0  Trouble concentrating  0  Moving slowly or fidgety/restless  0  Suicidal thoughts  0  PHQ-9 Score  8  Difficult doing work/chores  Very difficult     The following portions of the patient's history were reviewed and updated as appropriate: allergies, current medications, past family history, past medical history, past social history, past surgical history, and problem list.  Review of Systems Pertinent items noted in HPI and remainder of comprehensive ROS otherwise negative.  Past medical history, past surgical history, family history and social history were all reviewed and documented in the EPIC chart.  Exam:  Vitals:   09/02/24 1041  BP: 122/76  Pulse: 84  SpO2: 98%  Weight: 184 lb (83.5 kg)  Height: 5' 5.5 (1.664 m)    Body mass index is 30.15 kg/m.  General appearance:  Normal Thyroid:  Symmetrical, normal in size, without palpable masses or  nodularity. Respiratory  Auscultation:  Clear without wheezing or rhonchi Cardiovascular  Auscultation:  Regular rate, without rubs, murmurs or gallops  Edema/varicosities:  Not grossly evident Abdominal  Soft,nontender, without masses, guarding or rebound.  Liver/spleen:  No organomegaly noted  Hernia:  None appreciated  Skin  Inspection:  Grossly normal Breasts: Examined lying and sitting.   Right: Without masses, retractions, nipple discharge or axillary adenopathy.   Left: Lumpectomy scar. Without masses, retractions, nipple discharge or axillary adenopathy. Genitourinary   Inguinal/mons:  Normal without inguinal adenopathy  External genitalia:  Normal appearing vulva with no masses, tenderness, or lesions  BUS/Urethra/Skene's glands:  Normal  Vagina:  Normal appearing with normal color and discharge, no lesions. Atrophy: moderate  Cervix:  Normal appearing without discharge or lesions  Uterus:  Normal in size, shape and contour.  Midline and mobile, nontender  Adnexa/parametria:     Rt: Normal in size, without masses or tenderness.   Lt: Normal in size, without masses or tenderness.  Anus and perineum: Normal    Dereck Keas, CMA present for exam  Assessment/Plan:   1. Well woman exam with routine gynecological exam (Primary) Pap 2026  2. History of breast cancer Follow by oncology yearly Mammo scheduled 1/26  3. Genitourinary syndrome of menopause Discussed  benefits, safe with breast ca hx. - Estradiol  10 MCG TABS vaginal tablet; Place 1 tablet (10 mcg total) vaginally 2 (two) times a week.  Dispense: 8 tablet; Refill: 11  4. Depression screening    Return in 1 year for annual or sooner prn.  [name] WHNP-BC, 11:08 AM 09/02/2024

## 2024-09-02 NOTE — Patient Instructions (Signed)
 Preventive Care 58-57 Years Old, Female  Preventive care refers to lifestyle choices and visits with your health care provider that can promote health and wellness. Preventive care visits are also called wellness exams.  What can I expect for my preventive care visit?  Counseling  Your health care provider may ask you questions about your:  Medical history, including:  Past medical problems.  Family medical history.  Pregnancy history.  Current health, including:  Menstrual cycle.  Method of birth control.  Emotional well-being.  Home life and relationship well-being.  Sexual activity and sexual health.  Lifestyle, including:  Alcohol, nicotine or tobacco, and drug use.  Access to firearms.  Diet, exercise, and sleep habits.  Work and work Astronomer.  Sunscreen use.  Safety issues such as seatbelt and bike helmet use.  Physical exam  Your health care provider will check your:  Height and weight. These may be used to calculate your BMI (body mass index). BMI is a measurement that tells if you are at a healthy weight.  Waist circumference. This measures the distance around your waistline. This measurement also tells if you are at a healthy weight and may help predict your risk of certain diseases, such as type 2 diabetes and high blood pressure.  Heart rate and blood pressure.  Body temperature.  Skin for abnormal spots.  What immunizations do I need?    Vaccines are usually given at various ages, according to a schedule. Your health care provider will recommend vaccines for you based on your age, medical history, and lifestyle or other factors, such as travel or where you work.  What tests do I need?  Screening  Your health care provider may recommend screening tests for certain conditions. This may include:  Lipid and cholesterol levels.  Diabetes screening. This is done by checking your blood sugar (glucose) after you have not eaten for a while (fasting).  Pelvic exam and Pap test.  Hepatitis B test.  Hepatitis C  test.  HIV (human immunodeficiency virus) test.  STI (sexually transmitted infection) testing, if you are at risk.  Lung cancer screening.  Colorectal cancer screening.  Mammogram. Talk with your health care provider about when you should start having regular mammograms. This may depend on whether you have a family history of breast cancer.  BRCA-related cancer screening. This may be done if you have a family history of breast, ovarian, tubal, or peritoneal cancers.  Bone density scan. This is done to screen for osteoporosis.  Talk with your health care provider about your test results, treatment options, and if necessary, the need for more tests.  Follow these instructions at home:  Eating and drinking    Eat a diet that includes fresh fruits and vegetables, whole grains, lean protein, and low-fat dairy products.  Take vitamin and mineral supplements as recommended by your health care provider.  Do not drink alcohol if:  Your health care provider tells you not to drink.  You are pregnant, may be pregnant, or are planning to become pregnant.  If you drink alcohol:  Limit how much you have to 0-1 drink a day.  Know how much alcohol is in your drink. In the [place] one drink equals one 12 oz bottle of beer (355 mL), one 5 oz glass of wine (148 mL), or one 1 oz glass of hard liquor (44 mL).  Lifestyle  Brush your teeth every morning and night with fluoride toothpaste. Floss one time each day.  Exercise for at least  30 minutes 5 or more days each week.  Do not use any products that contain nicotine or tobacco. These products include cigarettes, chewing tobacco, and vaping devices, such as e-cigarettes. If you need help quitting, ask your health care provider.  Do not use drugs.  If you are sexually active, practice safe sex. Use a condom or other form of protection to prevent STIs.  If you do not wish to become pregnant, use a form of birth control. If you plan to become pregnant, see your health care provider for a  prepregnancy visit.  Take aspirin only as told by your health care provider. Make sure that you understand how much to take and what form to take. Work with your health care provider to find out whether it is safe and beneficial for you to take aspirin daily.  Find healthy ways to manage stress, such as:  Meditation, yoga, or listening to music.  Journaling.  Talking to a trusted person.  Spending time with friends and family.  Minimize exposure to UV radiation to reduce your risk of skin cancer.  Safety  Always wear your seat belt while driving or riding in a vehicle.  Do not drive:  If you have been drinking alcohol. Do not ride with someone who has been drinking.  When you are tired or distracted.  While texting.  If you have been using any mind-altering substances or drugs.  Wear a helmet and other protective equipment during sports activities.  If you have firearms in your house, make sure you follow all gun safety procedures.  Seek help if you have been physically or sexually abused.  What's next?  Visit your health care provider once a year for an annual wellness visit.  Ask your health care provider how often you should have your eyes and teeth checked.  Stay up to date on all vaccines.  This information is not intended to replace advice given to you by your health care provider. Make sure you discuss any questions you have with your health care provider.  Document Revised: 05/09/2021 Document Reviewed: 05/09/2021  Elsevier Patient Education  2024 [employer].

## 2024-09-05 ENCOUNTER — Encounter: Payer: Self-pay | Admitting: Hematology and Oncology

## 2024-11-29 ENCOUNTER — Ambulatory Visit
Admission: RE | Admit: 2024-11-29 | Discharge: 2024-11-29 | Disposition: A | Discharge status: Home / Self Care | Source: Ambulatory Visit | Attending: Hematology and Oncology | Admitting: Hematology and Oncology

## 2024-11-29 DIAGNOSIS — Z853 Personal history of malignant neoplasm of breast: Secondary | ICD-10-CM

## 2024-11-29 DIAGNOSIS — Z9889 Other specified postprocedural states: Secondary | ICD-10-CM

## 2024-12-07 ENCOUNTER — Other Ambulatory Visit: Payer: Self-pay | Admitting: Hematology and Oncology

## 2024-12-29 ENCOUNTER — Ambulatory Visit: Payer: Self-pay | Admitting: Hematology and Oncology

## 2025-01-05 ENCOUNTER — Inpatient Hospital Stay: Payer: Self-pay | Admitting: Hematology and Oncology

## 2025-01-10 ENCOUNTER — Inpatient Hospital Stay: Admitting: Hematology and Oncology

## 2025-01-31 ENCOUNTER — Inpatient Hospital Stay: Admitting: Hematology and Oncology
